# Patient Record
Sex: Male | Born: 1965 | Race: White | Hispanic: No | Smoking: Never smoker
Health system: Southern US, Community
[De-identification: ages and names within clinical notes are randomized; demographics above are authoritative.]

## PROBLEM LIST (undated history)

## (undated) DIAGNOSIS — M1990 Unspecified osteoarthritis, unspecified site: Secondary | ICD-10-CM

## (undated) DIAGNOSIS — F419 Anxiety disorder, unspecified: Secondary | ICD-10-CM

## (undated) DIAGNOSIS — F32A Depression, unspecified: Secondary | ICD-10-CM

## (undated) HISTORY — DX: Unspecified osteoarthritis, unspecified site: M19.90

## (undated) HISTORY — PX: LASIK: SHX215

## (undated) HISTORY — PX: VASECTOMY: SHX75

## (undated) HISTORY — PX: COLONOSCOPY W/ POLYPECTOMY: SHX1380

## (undated) HISTORY — DX: Anxiety disorder, unspecified: F41.9

## (undated) HISTORY — PX: FOREARM SURGERY: SHX651

## (undated) HISTORY — PX: HIP ARTHROSCOPY: SUR88

## (undated) HISTORY — PX: UMBILICAL HERNIA REPAIR: SHX196

## (undated) HISTORY — DX: Depression, unspecified: F32.A

---

## 1999-08-06 ENCOUNTER — Ambulatory Visit: Admission: RE | Admit: 1999-08-06 | Discharge: 1999-08-06 | Payer: Self-pay

## 2009-07-11 HISTORY — PX: TONSILECTOMY, ADENOIDECTOMY, BILATERAL MYRINGOTOMY AND TUBES: SHX2538

## 2020-02-13 ENCOUNTER — Encounter: Payer: Self-pay | Admitting: Cardiology

## 2020-02-13 ENCOUNTER — Other Ambulatory Visit: Payer: Self-pay

## 2020-02-13 ENCOUNTER — Ambulatory Visit (INDEPENDENT_AMBULATORY_CARE_PROVIDER_SITE_OTHER): Payer: Self-pay | Admitting: Cardiology

## 2020-02-13 DIAGNOSIS — E785 Hyperlipidemia, unspecified: Secondary | ICD-10-CM

## 2020-02-13 DIAGNOSIS — I10 Essential (primary) hypertension: Secondary | ICD-10-CM

## 2020-02-13 DIAGNOSIS — R072 Precordial pain: Secondary | ICD-10-CM

## 2020-02-13 HISTORY — DX: Precordial pain: R07.2

## 2020-02-13 HISTORY — DX: Essential (primary) hypertension: I10

## 2020-02-13 HISTORY — DX: Hyperlipidemia, unspecified: E78.5

## 2020-02-13 MED ORDER — RANOLAZINE ER 500 MG PO TB12: 500.0000 mg | ORAL_TABLET | Freq: Two times a day (BID) | ORAL | 2 refills | Status: DC

## 2020-02-13 MED ORDER — NITROGLYCERIN 0.4 MG SL SUBL: 0.4000 mg | SUBLINGUAL_TABLET | SUBLINGUAL | 11 refills | Status: DC | PRN

## 2020-02-13 NOTE — Patient Instructions (Addendum)
Medication Instructions:  Your physician has recommended you make the following change in your medication:   START: Ranexa 500 mg twice daily   TAKE AS NEEDED FOR CHEST PAIN ONLY AFTER 48 hours of NO CIALIS: Nitroglycerin 0.4 mg sublingual (under your tongue) as needed for chest pain. If experiencing chest pain, stop what you are doing and sit down. Take 1 nitroglycerin and wait 5 minutes. If chest pain continues, take another nitroglycerin and wait 5 minutes. If chest pain does not subside, take 1 more nitroglycerin and dial 911. You make take a total of 3 nitroglycerin in a 15 minute time frame.   *If you need a refill on your cardiac medications before your next appointment, please call your pharmacy*   Lab Work: None.  If you have labs (blood work) drawn today and your tests are completely normal, you will receive your results only by: Marland Kitchen MyChart Message (if you have MyChart) OR . A paper copy in the mail If you have any lab test that is abnormal or we need to change your treatment, we will call you to review the results.   Testing/Procedures: Your cardiac CT will be scheduled at one of the below locations:   [facility] [address] [place], [place] [address] [phone]  [place] [address] [place],  [address] [phone]  If scheduled at [facility], please arrive at the [facility] main entrance of [facility] 30 minutes prior to test start time. Proceed to the [facility] Radiology Department (first floor) to check-in and test prep.  If scheduled at [facility], please arrive 15 mins early for check-in and test prep.  Please follow these instructions carefully (unless otherwise directed):  Hold all erectile dysfunction medications at least 3 days (72 hrs) prior to test.  On the Night Before the Test: . Be sure to Drink plenty of water. . Do  not consume any caffeinated/decaffeinated beverages or chocolate 12 hours prior to your test. . Do not take any antihistamines 12 hours prior to your test.   On the Day of the Test: . Drink plenty of water. Do not drink any water within one hour of the test. . Do not eat any food 4 hours prior to the test. . You may take your regular medications prior to the test.  . Take metoprolol (Lopressor) (beta blocker/atenolol) two hours prior to test. . HOLD Furosemide/Hydrochlorothiazide morning of the test.         After the Test: . Drink plenty of water. . After receiving IV contrast, you may experience a mild flushed feeling. This is normal. . On occasion, you may experience a mild rash up to 24 hours after the test. This is not dangerous. If this occurs, you can take Benadryl 25 mg and increase your fluid intake. . If you experience trouble breathing, this can be serious. If it is severe call 911 IMMEDIATELY. If it is mild, please call our office. . If you take any of these medications: Glipizide/Metformin, Avandament, Glucavance, please do not take 48 hours after completing test unless otherwise instructed.   Once we have confirmed authorization from your insurance company, we will call you to set up a date and time for your test. Based on how quickly your insurance processes prior authorizations requests, please allow up to 4 weeks to be contacted for scheduling your Cardiac CT appointment. Be advised that routine Cardiac CT appointments  could be scheduled as many as 8 weeks after your provider has ordered it.  For non-scheduling related questions, please contact the cardiac imaging nurse navigator should you have any questions/concerns: Marchia Bond, Cardiac Imaging Nurse Navigator Burley Saver, Interim Cardiac Imaging Nurse [place] [facility] Direct Office Dial: [phone]   For scheduling needs, including cancellations and rescheduling, please call Vivien Rota at  [phone], option 3.      Follow-Up: At [facility], you and your health needs are our priority.  As part of our continuing mission to provide you with exceptional heart care, we have created designated Provider Care Teams.  These Care Teams include your primary Cardiologist (physician) and Advanced Practice Providers (APPs -  Physician Assistants and Nurse Practitioners) who all work together to provide you with the care you need, when you need it.  We recommend signing up for the patient portal called "MyChart".  Sign up information is provided on this After Visit Summary.  MyChart is used to connect with patients for Virtual Visits (Telemedicine).  Patients are able to view lab/test results, encounter notes, upcoming appointments, etc.  Non-urgent messages can be sent to your provider as well.   To learn more about what you can do with MyChart, go to [email or web address].    Your next appointment:   1 month(s)  The format for your next appointment:   In Person  Provider:   Jenne Campus, MD   Other Instructions  Nitroglycerin sublingual tablets What is this medicine? NITROGLYCERIN (nye troe GLI ser in) is a type of vasodilator. It relaxes blood vessels, increasing the blood and oxygen supply to your heart. This medicine is used to relieve chest pain caused by angina. It is also used to prevent chest pain before activities like climbing stairs, going outdoors in cold weather, or sexual activity. This medicine may be used for other purposes; ask your health care provider or pharmacist if you have questions. COMMON BRAND NAME(S): Nitroquick, Nitrostat, Nitrotab What should I tell my health care provider before I take this medicine? They need to know if you have any of these conditions:  anemia  head injury, recent stroke, or bleeding in the brain  liver disease  previous heart attack  an unusual or allergic reaction to nitroglycerin, other medicines, foods, dyes, or  preservatives  pregnant or trying to get pregnant  breast-feeding How should I use this medicine? Take this medicine by mouth as needed. At the first sign of an angina attack (chest pain or tightness) place one tablet under your tongue. You can also take this medicine 5 to 10 minutes before an event likely to produce chest pain. Follow the directions on the prescription label. Let the tablet dissolve under the tongue. Do not swallow whole. Replace the dose if you accidentally swallow it. It will help if your mouth is not dry. Saliva around the tablet will help it to dissolve more quickly. Do not eat or drink, smoke or chew tobacco while a tablet is dissolving. If you are not better within 5 minutes after taking ONE dose of nitroglycerin, call 9-1-1 immediately to seek emergency medical care. Do not take more than 3 nitroglycerin tablets over 15 minutes. If you take this medicine often to relieve symptoms of angina, your doctor or health care professional may provide you with different instructions to manage your symptoms. If symptoms do not go away after following these instructions, it is important to call 9-1-1 immediately. Do not take more than 3 nitroglycerin  tablets over 15 minutes. Talk to your pediatrician regarding the use of this medicine in children. Special care may be needed. Overdosage: If you think you have taken too much of this medicine contact a poison control center or emergency room at once. NOTE: This medicine is only for you. Do not share this medicine with others. What if I miss a dose? This does not apply. This medicine is only used as needed. What may interact with this medicine? Do not take this medicine with any of the following medications:  certain migraine medicines like ergotamine and dihydroergotamine (DHE)  medicines used to treat erectile dysfunction like sildenafil, tadalafil, and vardenafil  riociguat This medicine may also interact with the following  medications:  alteplase  aspirin  heparin  medicines for high blood pressure  medicines for mental depression  other medicines used to treat angina  phenothiazines like chlorpromazine, mesoridazine, prochlorperazine, thioridazine This list may not describe all possible interactions. Give your health care provider a list of all the medicines, herbs, non-prescription drugs, or dietary supplements you use. Also tell them if you smoke, drink alcohol, or use illegal drugs. Some items may interact with your medicine. What should I watch for while using this medicine? Tell your doctor or health care professional if you feel your medicine is no longer working. Keep this medicine with you at all times. Sit or lie down when you take your medicine to prevent falling if you feel dizzy or faint after using it. Try to remain calm. This will help you to feel better faster. If you feel dizzy, take several deep breaths and lie down with your feet propped up, or bend forward with your head resting between your knees. You may get drowsy or dizzy. Do not drive, use machinery, or do anything that needs mental alertness until you know how this drug affects you. Do not stand or sit up quickly, especially if you are an older patient. This reduces the risk of dizzy or fainting spells. Alcohol can make you more drowsy and dizzy. Avoid alcoholic drinks. Do not treat yourself for coughs, colds, or pain while you are taking this medicine without asking your doctor or health care professional for advice. Some ingredients may increase your blood pressure. What side effects may I notice from receiving this medicine? Side effects that you should report to your doctor or health care professional as soon as possible:  blurred vision  dry mouth  skin rash  sweating  the feeling of extreme pressure in the head  unusually weak or tired Side effects that usually do not require medical attention (report to your doctor or  health care professional if they continue or are bothersome):  flushing of the face or neck  headache  irregular heartbeat, palpitations  nausea, vomiting This list may not describe all possible side effects. Call your doctor for medical advice about side effects. You may report side effects to FDA at 1-800-FDA-1088. Where should I keep my medicine? Keep out of the reach of children. Store at room temperature between 20 and 25 degrees C (68 and 77 degrees F). Store in Chief of Staff. Protect from light and moisture. Keep tightly closed. Throw away any unused medicine after the expiration date. NOTE: This sheet is a summary. It may not cover all possible information. If you have questions about this medicine, talk to your doctor, pharmacist, or health care provider.  2020 Elsevier/Gold Standard (2013-04-25 17:57:36)

## 2020-02-13 NOTE — Progress Notes (Signed)
Cardiology Consultation:    Date:  02/13/2020   ID:  Dale Mills, DOB 06/16/1966, MRN [id number]  PCP:  Nonnie Done., MD  Cardiologist:  Gypsy Balsam, MD   Referring MD: Nonnie Done., MD   No chief complaint on file. After chest pain  History of Present Illness:    Dale Mills is a 54 y.o. male who is being seen today for the evaluation of pain at the request of [place], Excell Seltzer., MD.  His past medical history significant for essential hypertension, dyslipidemia, he never smoked but does have family history of premature coronary artery disease.  His father actually had first problem with his heart before age of 26.  He was referred to [place] because of chest pain.  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  When he does not he will develop the sensation it is associated with some sweating but he sweats anywhere, there is no shortness of breath he graded sensation of 4 in scale up to 10.  Has been gone for about a month a month and a half.  He also describes similar sensation when he will do weed eating.  At the same time he is able to walk climb stairs and no difficulties doing that.  There is no shortness of breath associated with this sensation. His risk factors include family history of premature coronary artery disease in his father side. He does have dyslipidemia   History reviewed. No pertinent past medical history.  Past Surgical History:  Procedure Laterality Date  . TONSILECTOMY, ADENOIDECTOMY, BILATERAL MYRINGOTOMY AND TUBES  2011    Current Medications: Current Meds  Medication Sig  . aspirin 325 MG tablet Take 325 mg by mouth daily.  Marland Kitchen atenolol (TENORMIN) 25 MG tablet TAKE 1 2 (ONE HALF) TABLET BY MOUTH ONCE DAILY  . atorvastatin (LIPITOR) 10 MG tablet Take 10 mg by mouth daily.  Marland Kitchen lisinopril (ZESTRIL) 20 MG tablet Take 20 mg by mouth daily.  . tadalafil (CIALIS)  20 MG tablet Take 1 tablet by mouth daily.     Allergies:   Patient has no known allergies.   Social History   Socioeconomic History  . Marital status: Divorced    Spouse name: Not on file  . Number of children: Not on file  . Years of education: Not on file  . Highest education level: Not on file  Occupational History  . Not on file  Tobacco Use  . Smoking status: Never Smoker  . Smokeless tobacco: Never Used  Substance and Sexual Activity  . Alcohol use: Never  . Drug use: Never  . Sexual activity: Not on file  Other Topics Concern  . Not on file  Social History Narrative  . Not on file   Social Determinants of Health   Financial Resource Strain:   . Difficulty of Paying Living Expenses:   Food Insecurity:   . Worried About Programme researcher, broadcasting/film/video in the Last Year:   . Barista in the Last Year:   Transportation Needs:   . Freight forwarder (Medical):   Marland Kitchen Lack of Transportation (Non-Medical):   Physical Activity:   . Days of Exercise per Week:   . Minutes of Exercise per Session:   Stress:   . Feeling of Stress :   Social Connections:   . Frequency of Communication with Friends and Family:   .  Frequency of Social Gatherings with Friends and Family:   . Attends Religious Services:   . Active Member of Clubs or Organizations:   . Attends Banker Meetings:   Marland Kitchen Marital Status:      Family History: The patient's family history includes Heart attack in his father and paternal grandfather. ROS:   Please see the history of present illness.    All 14 point review of systems negative except as described per history of present illness.  EKGs/Labs/Other Studies Reviewed:    The following studies were reviewed today:   EKG:  EKG is  ordered today.  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  Recent Labs: No results found for  requested labs within last 8760 hours.  Recent Lipid Panel No results found for: CHOL, TRIG, HDL, CHOLHDL, VLDL, LDLCALC, LDLDIRECT  Physical Exam:    VS:  BP 126/72 (BP Location: Left Arm, Patient Position: Sitting, Cuff Size: Normal)   Pulse (!) 53   Ht 6' (1.829 m)   Wt 280 lb (127 kg)   SpO2 96%   BMI 37.97 kg/m     Wt Readings from Last 3 Encounters:  02/13/20 280 lb (127 kg)     GEN:  Well nourished, well developed in no acute distress HEENT: Normal NECK: No JVD; No carotid bruits LYMPHATICS: No lymphadenopathy CARDIAC: RRR, no murmurs, no rubs, no gallops RESPIRATORY:  Clear to auscultation without rales, wheezing or rhonchi  ABDOMEN: Soft, non-tender, non-distended MUSCULOSKELETAL:  No edema; No deformity  SKIN: Warm and dry NEUROLOGIC:  Alert and oriented x 3 PSYCHIATRIC:  Normal affect   ASSESSMENT:    1. Precordial chest pain   2. Essential hypertension   3. Dyslipidemia    PLAN:    In order of problems listed above:  1. Precordial chest pain suspicious for coronary artery disease/angina pectoris.  He is already on aspirin which advised him to continue, he is already on statin which I will continue.  I will add ranolazine 500 twice daily to his medical regiment as well as nitroglycerin as needed.  We will talk about options for the situation I will decided to proceed with coronary CT angio.  He does have some atypical features of this sensation mainly examining his epigastrium when palpating it can reproduce the pain.  However clearly he does have some very worrisome characteristics.  He was told to go to the emergency room if nitroglycerin does not relieve the pain. 2. Essential hypertension blood pressure seems to be controlled today his blood pressure is 126/72 we will continue present management. 3. Dyslipidemia he is on moderate intensity statin that were just recently started he is on Lipitor 10 which I will continue for now.   He was told to avoid  excessive exercise he was also told to go to the emergency room if pain is not relieved by.  Nitroglycerin  Medication Adjustments/Labs and Tests Ordered: Current medicines are reviewed at length with the patient today.  Concerns regarding medicines are outlined above.  No orders of the defined types were placed in this encounter.  No orders of the defined types were placed in this encounter.   Signed, Georgeanna Lea, MD, [name]. 02/13/2020 3:23 PM    [facility]

## 2020-04-10 ENCOUNTER — Other Ambulatory Visit: Payer: Self-pay

## 2020-04-10 ENCOUNTER — Encounter: Payer: Self-pay | Admitting: Cardiology

## 2020-04-10 ENCOUNTER — Ambulatory Visit (INDEPENDENT_AMBULATORY_CARE_PROVIDER_SITE_OTHER): Payer: Self-pay | Admitting: Cardiology

## 2020-04-10 VITALS — BP 134/68 | HR 60 | Ht 72.0 in | Wt 280.6 lb

## 2020-04-10 DIAGNOSIS — E785 Hyperlipidemia, unspecified: Secondary | ICD-10-CM

## 2020-04-10 DIAGNOSIS — I209 Angina pectoris, unspecified: Secondary | ICD-10-CM | POA: Insufficient documentation

## 2020-04-10 DIAGNOSIS — I10 Essential (primary) hypertension: Secondary | ICD-10-CM

## 2020-04-10 HISTORY — DX: Angina pectoris, unspecified: I20.9

## 2020-04-10 MED ORDER — RANOLAZINE ER 1000 MG PO TB12: 1000.0000 mg | ORAL_TABLET | Freq: Two times a day (BID) | ORAL | 1 refills | Status: DC

## 2020-04-10 NOTE — Patient Instructions (Signed)
Medication Instructions:  Your physician has recommended you make the following change in your medication:   INCREASE: Ranexa 1000 mg twice daily   *If you need a refill on your cardiac medications before your next appointment, please call your pharmacy*   Lab Work: Your physician recommends that you return for lab work today: lipid   If you have labs (blood work) drawn today and your tests are completely normal, you will receive your results only by: Marland Kitchen MyChart Message (if you have MyChart) OR . A paper copy in the mail If you have any lab test that is abnormal or we need to change your treatment, we will call you to review the results.   Testing/Procedures: None.    Follow-Up: At [facility], you and your health needs are our priority.  As part of our continuing mission to provide you with exceptional heart care, we have created designated Provider Care Teams.  These Care Teams include your primary Cardiologist (physician) and Advanced Practice Providers (APPs -  Physician Assistants and Nurse Practitioners) who all work together to provide you with the care you need, when you need it.  We recommend signing up for the patient portal called "MyChart".  Sign up information is provided on this After Visit Summary.  MyChart is used to connect with patients for Virtual Visits (Telemedicine).  Patients are able to view lab/test results, encounter notes, upcoming appointments, etc.  Non-urgent messages can be sent to your provider as well.   To learn more about what you can do with MyChart, go to [email or web address].    Your next appointment:   2 month(s)  The format for your next appointment:   In Person  Provider:   Gypsy Balsam, MD   Other Instructions

## 2020-04-10 NOTE — Progress Notes (Signed)
Cardiology Office Note:    Date:  04/10/2020   ID:  Dale Mills, DOB 11/03/1965, MRN [id number]  PCP:  Nonnie Done., MD  Cardiologist:  Gypsy Balsam, MD    Referring MD: Nonnie Done., MD   No chief complaint on file. I am doing better  History of Present Illness:    Dale Mills is a 54 y.o. male he came to me with chief complaint of exertional chest pain.  That pain also happened with excitement.  He did have some atypical features including some tenderness on epigastric pressing.  We elected to pursue coronary CT angio however there was some difficulty approving from insurance company make a long story short of the procedure still not done.  I put him on aspirin and put him also on ranolazine and he feels significantly better he said the pain is much improved.  I also initiated him on Lipitor.  He said he has more energy and he can do more right now.  Denies having resting pain no prolonged episode of chest pain.  No dizziness no passing out  History reviewed. No pertinent past medical history.  Past Surgical History:  Procedure Laterality Date  . TONSILECTOMY, ADENOIDECTOMY, BILATERAL MYRINGOTOMY AND TUBES  2011    Current Medications: Current Meds  Medication Sig  . aspirin 325 MG tablet Take 325 mg by mouth daily.  Marland Kitchen atenolol (TENORMIN) 25 MG tablet TAKE 1 2 (ONE HALF) TABLET BY MOUTH ONCE DAILY  . atorvastatin (LIPITOR) 10 MG tablet Take 10 mg by mouth daily.  Marland Kitchen lisinopril (ZESTRIL) 20 MG tablet Take 20 mg by mouth daily.  . nitroGLYCERIN (NITROSTAT) 0.4 MG SL tablet Place 1 tablet (0.4 mg total) under the tongue every 5 (five) minutes as needed for chest pain.  . ranolazine (RANEXA) 500 MG 12 hr tablet Take 1 tablet (500 mg total) by mouth 2 (two) times daily.  . tadalafil (CIALIS) 20 MG tablet Take 1 tablet by mouth daily.     Allergies:   Patient has no known allergies.   Social History   Socioeconomic History  . Marital status: Divorced    Spouse  name: Not on file  . Number of children: Not on file  . Years of education: Not on file  . Highest education level: Not on file  Occupational History  . Not on file  Tobacco Use  . Smoking status: Never Smoker  . Smokeless tobacco: Never Used  Substance and Sexual Activity  . Alcohol use: Never  . Drug use: Never  . Sexual activity: Not on file  Other Topics Concern  . Not on file  Social History Narrative  . Not on file   Social Determinants of Health   Financial Resource Strain:   . Difficulty of Paying Living Expenses: Not on file  Food Insecurity:   . Worried About Programme researcher, broadcasting/film/video in the Last Year: Not on file  . Ran Out of Food in the Last Year: Not on file  Transportation Needs:   . Lack of Transportation (Medical): Not on file  . Lack of Transportation (Non-Medical): Not on file  Physical Activity:   . Days of Exercise per Week: Not on file  . Minutes of Exercise per Session: Not on file  Stress:   . Feeling of Stress : Not on file  Social Connections:   . Frequency of Communication with Friends and Family: Not on file  . Frequency of Social Gatherings with Friends and Family:  Not on file  . Attends Religious Services: Not on file  . Active Member of Clubs or Organizations: Not on file  . Attends Banker Meetings: Not on file  . Marital Status: Not on file     Family History: The patient's family history includes Heart attack in his father and paternal grandfather. ROS:   Please see the history of present illness.    All 14 point review of systems negative except as described per history of present illness  EKGs/Labs/Other Studies Reviewed:      Recent Labs: No results found for requested labs within last 8760 hours.  Recent Lipid Panel No results found for: CHOL, TRIG, HDL, CHOLHDL, VLDL, LDLCALC, LDLDIRECT  Physical Exam:    VS:  BP 134/68 (BP Location: Left Arm, Patient Position: Sitting, Cuff Size: Normal)   Pulse 60   Ht 6'  (1.829 m)   Wt 280 lb 9.6 oz (127.3 kg)   SpO2 97%   BMI 38.06 kg/m     Wt Readings from Last 3 Encounters:  04/10/20 280 lb 9.6 oz (127.3 kg)  02/13/20 280 lb (127 kg)     GEN:  Well nourished, well developed in no acute distress HEENT: Normal NECK: No JVD; No carotid bruits LYMPHATICS: No lymphadenopathy CARDIAC: RRR, no murmurs, no rubs, no gallops RESPIRATORY:  Clear to auscultation without rales, wheezing or rhonchi  ABDOMEN: Soft, non-tender, non-distended MUSCULOSKELETAL:  No edema; No deformity  SKIN: Warm and dry LOWER EXTREMITIES: no swelling NEUROLOGIC:  Alert and oriented x 3 PSYCHIATRIC:  Normal affect   ASSESSMENT:    1. Essential hypertension   2. Angina pectoris (HCC) Canadian classification 2   3. Dyslipidemia    PLAN:    In order of problems listed above:  1. Angina pectoris: So far medical therapy I will double the dose of ranolazine from 500 mg twice daily to 1000 mg twice a day, will continue using nitroglycerin on as-needed basis.  I asked him to reduce dose of aspirin from 325 to 81 mg twice daily.  He is already taking beta-blocker which I will continue.  I told him also he cannot take Viagra/Cialis when he takes nitroglycerin.  Will investigate what is the problem with coronary CT angio if they will not pay for it we will look for alternative way to manage this problem so far he seems to be responding to medical therapy however we still do not know exactly how extensive the problem is. 2. Dyslipidemia he is on 10 mg Lipitor I will check his fasting lipid profile today.  Anticipate need to increase dose of Lipitor. 3. Essential hypertension: Blood pressure well controlled today.   Medication Adjustments/Labs and Tests Ordered: Current medicines are reviewed at length with the patient today.  Concerns regarding medicines are outlined above.  No orders of the defined types were placed in this encounter.  Medication changes: No orders of the defined  types were placed in this encounter.   Signed, Georgeanna Lea, MD, [facility] 04/10/2020 3:04 PM    [facility]

## 2020-04-17 LAB — LIPID PANEL
Chol/HDL Ratio: 4.7 ratio (ref 0.0–5.0)
Cholesterol, Total: 155 mg/dL (ref 100–199)
HDL: 33 mg/dL — ABNORMAL LOW (ref >39)
LDL Chol Calc (NIH): 104 mg/dL — ABNORMAL HIGH (ref 0–99)
Triglycerides: 96 mg/dL (ref 0–149)
VLDL Cholesterol Cal: 18 mg/dL (ref 5–40)

## 2020-04-27 ENCOUNTER — Telehealth: Payer: Self-pay | Admitting: Cardiology

## 2020-04-27 ENCOUNTER — Other Ambulatory Visit: Payer: Self-pay

## 2020-04-27 MED ORDER — ATORVASTATIN CALCIUM 20 MG PO TABS
20.0000 mg | ORAL_TABLET | Freq: Every day | ORAL | 3 refills | Status: DC
Start: 2020-04-27 — End: 2021-06-25

## 2020-04-27 NOTE — Telephone Encounter (Signed)
Pt advised his lab results and agreed to taking Lipitor 20 mg daily... New RX sent to this pharmacy.   Pt asking what to do with his beta blocker prior to the Cardiac CT this Friday 05/01/20.   He takes Atenolol 12.5 mg daily and his HR at his Last OV was 60.. will forward to [facility] for review.

## 2020-04-27 NOTE — Telephone Encounter (Signed)
Pt called back in , returning call to [facility] for lab results   Best number-  [phone]

## 2020-04-28 NOTE — Telephone Encounter (Signed)
Called patient informed him to take atenolol 2 hour before ct. Patient verbally understands.

## 2020-04-28 NOTE — Telephone Encounter (Signed)
He either take metoprolol as per protocol or atenolol before his CT

## 2020-04-29 ENCOUNTER — Telehealth (HOSPITAL_COMMUNITY): Payer: Self-pay | Admitting: Emergency Medicine

## 2020-04-29 NOTE — Telephone Encounter (Signed)
Reaching out to patient to offer assistance regarding upcoming cardiac imaging study; pt verbalizes understanding of appt date/time, parking situation and where to check in, pre-test NPO status and medications ordered, and verified current allergies; name and call back number provided for further questions should they arise Rockwell Alexandria RN Navigator Cardiac Imaging [facility] [phone] office [phone] cell    pt informed NOT to take CIALIS now until after test is completed. Pt verbalized understanding. Pt also verbalized understanding to take daily atenolol 2 hr prior to scan

## 2020-05-01 ENCOUNTER — Other Ambulatory Visit: Payer: Self-pay

## 2020-05-01 ENCOUNTER — Ambulatory Visit (HOSPITAL_COMMUNITY)
Admission: RE | Admit: 2020-05-01 | Discharge: 2020-05-01 | Disposition: A | Discharge status: Home / Self Care | Payer: Self-pay | Source: Ambulatory Visit | Attending: Cardiology | Admitting: Cardiology

## 2020-05-01 DIAGNOSIS — R072 Precordial pain: Secondary | ICD-10-CM

## 2020-05-01 DIAGNOSIS — I10 Essential (primary) hypertension: Secondary | ICD-10-CM | POA: Insufficient documentation

## 2020-05-01 DIAGNOSIS — E785 Hyperlipidemia, unspecified: Secondary | ICD-10-CM | POA: Insufficient documentation

## 2020-05-01 MED ORDER — NITROGLYCERIN 0.4 MG SL SUBL
0.8000 mg | SUBLINGUAL_TABLET | Freq: Once | SUBLINGUAL | Status: AC
  Administered 2020-05-01: 0.8 mg via SUBLINGUAL

## 2020-05-01 MED ORDER — IOHEXOL 350 MG/ML SOLN
90.0000 mL | Freq: Once | INTRAVENOUS | Status: AC | PRN
  Administered 2020-05-01: 90 mL via INTRAVENOUS

## 2020-05-01 MED ORDER — NITROGLYCERIN 0.4 MG SL SUBL
SUBLINGUAL_TABLET | SUBLINGUAL | Status: AC
  Filled 2020-05-01: qty 2

## 2020-05-13 ENCOUNTER — Telehealth: Payer: Self-pay | Admitting: Cardiology

## 2020-05-13 NOTE — Telephone Encounter (Signed)
Dale Lea, MD  05/05/2020 3:17 PM EDT     Very minimal coronary disease involving distal portion of left anterior descending artery, nonobstructive. Calcium score only 15. Overall is a good result   Reviewed results with patient who verbalized understanding.   Reiterated the importance of healthy lifestyle modifications to prevent progression. He was grateful for call and agrees with plan.

## 2020-05-13 NOTE — Telephone Encounter (Signed)
Patient is returning call to discuss CT results. 

## 2020-06-12 ENCOUNTER — Ambulatory Visit (INDEPENDENT_AMBULATORY_CARE_PROVIDER_SITE_OTHER): Payer: Self-pay | Admitting: Cardiology

## 2020-06-12 ENCOUNTER — Encounter: Payer: Self-pay | Admitting: Cardiology

## 2020-06-12 ENCOUNTER — Other Ambulatory Visit: Payer: Self-pay

## 2020-06-12 VITALS — BP 140/82 | HR 75 | Ht 72.0 in | Wt 274.0 lb

## 2020-06-12 DIAGNOSIS — E785 Hyperlipidemia, unspecified: Secondary | ICD-10-CM

## 2020-06-12 DIAGNOSIS — I209 Angina pectoris, unspecified: Secondary | ICD-10-CM

## 2020-06-12 DIAGNOSIS — I25118 Atherosclerotic heart disease of native coronary artery with other forms of angina pectoris: Secondary | ICD-10-CM

## 2020-06-12 DIAGNOSIS — I2510 Atherosclerotic heart disease of native coronary artery without angina pectoris: Secondary | ICD-10-CM | POA: Insufficient documentation

## 2020-06-12 DIAGNOSIS — I10 Essential (primary) hypertension: Secondary | ICD-10-CM

## 2020-06-12 HISTORY — DX: Atherosclerotic heart disease of native coronary artery without angina pectoris: I25.10

## 2020-06-12 NOTE — Progress Notes (Signed)
Cardiology Office Note:    Date:  06/12/2020   ID:  Dale Mills, DOB [birth year], MRN [id number]  PCP:  Nonnie Done., MD  Cardiologist:  Gypsy Balsam, MD    Referring MD: Nonnie Done., MD   Chief Complaint  Patient presents with  . follow up CT scan  I am doing much better  History of Present Illness:    Dale Mills is a 54 y.o. male who came to [place] with chest pain some suspicion for angina pectoris.  He was put on appropriate antianginal medication with very good response in the meantime he did have coronary CT angio which did not show any major obstruction in his coronary arteries.  He comes today to my office to discuss this.  Overall he is doing well on antianginal therapy denies have any chest pain no tightness no squeezing no pressure no burning in the chest.  Past Medical History:  Diagnosis Date  . Angina pectoris (HCC) Canadian classification 2 04/10/2020  . Dyslipidemia 02/13/2020  . Essential hypertension 02/13/2020  . Precordial chest pain 02/13/2020    Past Surgical History:  Procedure Laterality Date  . TONSILECTOMY, ADENOIDECTOMY, BILATERAL MYRINGOTOMY AND TUBES  2011    Current Medications: Current Meds  Medication Sig  . aspirin 325 MG tablet Take 325 mg by mouth daily.  Marland Kitchen atenolol (TENORMIN) 25 MG tablet TAKE 1 2 (ONE HALF) TABLET BY MOUTH ONCE DAILY  . atorvastatin (LIPITOR) 20 MG tablet Take 1 tablet (20 mg total) by mouth daily.  Marland Kitchen lisinopril (ZESTRIL) 20 MG tablet Take 20 mg by mouth daily.  . nitroGLYCERIN (NITROSTAT) 0.4 MG SL tablet Place 1 tablet (0.4 mg total) under the tongue every 5 (five) minutes as needed for chest pain.  . ranolazine (RANEXA) 1000 MG SR tablet Take 1 tablet (1,000 mg total) by mouth 2 (two) times daily. (Patient taking differently: Take 1,000 mg by mouth daily. )  . tadalafil (CIALIS) 20 MG tablet Take 1 tablet by mouth every 3 (three) days.      Allergies:   Patient has no known allergies.   Social History    Socioeconomic History  . Marital status: Divorced    Spouse name: Not on file  . Number of children: Not on file  . Years of education: Not on file  . Highest education level: Not on file  Occupational History  . Not on file  Tobacco Use  . Smoking status: Never Smoker  . Smokeless tobacco: Never Used  Substance and Sexual Activity  . Alcohol use: Never  . Drug use: Never  . Sexual activity: Not on file  Other Topics Concern  . Not on file  Social History Narrative  . Not on file   Social Determinants of Health   Financial Resource Strain:   . Difficulty of Paying Living Expenses: Not on file  Food Insecurity:   . Worried About Programme researcher, broadcasting/film/video in the Last Year: Not on file  . Ran Out of Food in the Last Year: Not on file  Transportation Needs:   . Lack of Transportation (Medical): Not on file  . Lack of Transportation (Non-Medical): Not on file  Physical Activity:   . Days of Exercise per Week: Not on file  . Minutes of Exercise per Session: Not on file  Stress:   . Feeling of Stress : Not on file  Social Connections:   . Frequency of Communication with Friends and Family: Not on file  .  Frequency of Social Gatherings with Friends and Family: Not on file  . Attends Religious Services: Not on file  . Active Member of Clubs or Organizations: Not on file  . Attends Banker Meetings: Not on file  . Marital Status: Not on file     Family History: The patient's family history includes Heart attack in his father and paternal grandfather. ROS:   Please see the history of present illness.    All 14 point review of systems negative except as described per history of present illness  EKGs/Labs/Other Studies Reviewed:      Recent Labs: No results found for requested labs within last 8760 hours.  Recent Lipid Panel    Component Value Date/Time   CHOL 155 04/17/2020 0839   TRIG 96 04/17/2020 0839   HDL 33 (L) 04/17/2020 0839   CHOLHDL 4.7  04/17/2020 0839   LDLCALC 104 (H) 04/17/2020 0839    Physical Exam:    VS:  BP 140/82 (BP Location: Left Arm, Patient Position: Sitting)   Pulse 75   Ht 6' (1.829 m)   Wt 274 lb (124.3 kg)   SpO2 96%   BMI 37.16 kg/m     Wt Readings from Last 3 Encounters:  06/12/20 274 lb (124.3 kg)  04/10/20 280 lb 9.6 oz (127.3 kg)  02/13/20 280 lb (127 kg)     GEN:  Well nourished, well developed in no acute distress HEENT: Normal NECK: No JVD; No carotid bruits LYMPHATICS: No lymphadenopathy CARDIAC: RRR, no murmurs, no rubs, no gallops RESPIRATORY:  Clear to auscultation without rales, wheezing or rhonchi  ABDOMEN: Soft, non-tender, non-distended MUSCULOSKELETAL:  No edema; No deformity  SKIN: Warm and dry LOWER EXTREMITIES: no swelling NEUROLOGIC:  Alert and oriented x 3 PSYCHIATRIC:  Normal affect   ASSESSMENT:    1. Angina pectoris (HCC) Canadian classification 2   2. Coronary artery disease of native artery of native heart with stable angina pectoris (HCC)   3. Essential hypertension   4. Dyslipidemia    PLAN:    In order of problems listed above:  1. Angina pectoris now successfully managed with medications.  Coronary CT angio did not show major obstructions I suspect he may have some small branch disease or distal disease.  He is responding nicely to antianginal therapy, he will be risk factors modifications.  His blood pressure is well controlled which I will continue, I will continue antianginal medications including ranolazine. 2. Dyslipidemia: I did review his K PN which show me his LDL of 104 this is from April 17, 2020 with HDL of 33.  This is on 20 mg of Lipitor.  I will ask him to have fasting profile done today and I anticipate need to increase the dose of Lipitor. 3. Essential hypertension: Blood pressure well controlled continue present management. 4. We did talk about healthy lifestyle need to exercise on the regular basis as well as good diet.   Medication  Adjustments/Labs and Tests Ordered: Current medicines are reviewed at length with the patient today.  Concerns regarding medicines are outlined above.  No orders of the defined types were placed in this encounter.  Medication changes: No orders of the defined types were placed in this encounter.   Signed, Georgeanna Lea, MD, [facility] 06/12/2020 9:32 AM    [facility]

## 2020-06-12 NOTE — Addendum Note (Signed)
Addended by: Delorse Limber I on: 06/12/2020 09:36 AM   Modules accepted: Orders

## 2020-06-12 NOTE — Patient Instructions (Signed)

## 2020-06-13 LAB — LIPID PANEL
Chol/HDL Ratio: 5.9 ratio — ABNORMAL HIGH (ref 0.0–5.0)
Cholesterol, Total: 184 mg/dL (ref 100–199)
HDL: 31 mg/dL — ABNORMAL LOW (ref >39)
LDL Chol Calc (NIH): 123 mg/dL — ABNORMAL HIGH (ref 0–99)
Triglycerides: 169 mg/dL — ABNORMAL HIGH (ref 0–149)
VLDL Cholesterol Cal: 30 mg/dL (ref 5–40)

## 2020-06-15 ENCOUNTER — Telehealth: Payer: Self-pay | Admitting: Emergency Medicine

## 2020-06-15 DIAGNOSIS — E785 Hyperlipidemia, unspecified: Secondary | ICD-10-CM

## 2020-06-15 NOTE — Telephone Encounter (Signed)
Called patient. Informed him of results.Advised him to increase lipitor to 40 mg daily however he said he only takes 10 mg. Asked him to increase to 20 mg then and recheck labs in 6 weeks he verbally understood no further questions. Will informed Dr. Bing Matter.

## 2020-06-15 NOTE — Telephone Encounter (Signed)
-----   Message from Georgeanna Lea, MD sent at 06/15/2020 11:41 AM EST ----- Cholesterol still not already supposed to be, please double the dose of Lipitor, fasting lipid profile AST ALT in 6 weeks

## 2020-08-07 ENCOUNTER — Other Ambulatory Visit: Payer: Self-pay | Admitting: Cardiology

## 2020-10-09 ENCOUNTER — Other Ambulatory Visit: Payer: Self-pay | Admitting: Cardiology

## 2020-11-13 ENCOUNTER — Ambulatory Visit (INDEPENDENT_AMBULATORY_CARE_PROVIDER_SITE_OTHER): Payer: 59 | Admitting: Cardiology

## 2020-11-13 ENCOUNTER — Other Ambulatory Visit: Payer: Self-pay

## 2020-11-13 ENCOUNTER — Encounter: Payer: Self-pay | Admitting: Cardiology

## 2020-11-13 VITALS — BP 126/76 | HR 99 | Ht 72.0 in | Wt 269.0 lb

## 2020-11-13 DIAGNOSIS — I25118 Atherosclerotic heart disease of native coronary artery with other forms of angina pectoris: Secondary | ICD-10-CM | POA: Diagnosis not present

## 2020-11-13 DIAGNOSIS — I10 Essential (primary) hypertension: Secondary | ICD-10-CM

## 2020-11-13 DIAGNOSIS — E785 Hyperlipidemia, unspecified: Secondary | ICD-10-CM | POA: Diagnosis not present

## 2020-11-13 DIAGNOSIS — I209 Angina pectoris, unspecified: Secondary | ICD-10-CM | POA: Diagnosis not present

## 2020-11-13 NOTE — Addendum Note (Signed)
Addended by: Hazle Quant on: 11/13/2020 12:50 PM   Modules accepted: Orders

## 2020-11-13 NOTE — Progress Notes (Signed)
Cardiology Office Note:    Date:  11/13/2020   ID:  [name], DOB Apr 04, 1966, MRN [id number]  PCP:  Nonnie Done., MD  Cardiologist:  Gypsy Balsam, MD    Referring MD: Nonnie Done., MD   Chief Complaint  Patient presents with  . Follow-up  I am doing well  History of Present Illness:    Dale Mills is a 55 y.o. male who presented to me with fairly typical angina pectoris we initiated medical therapy with very good success, after that coronary CT angio being done which showed no significant obstruction in the major arteries.  Since that time he is being managed medically with great success he tells me today he is completely asymptomatic.  Denies have any chest pain tightness squeezing pressure burning chest no dizziness no passing out.  He is working hard physically have no difficulty doing it.  Past Medical History:  Diagnosis Date  . Angina pectoris (HCC) Canadian classification 2 04/10/2020  . Coronary artery disease coronary CT angio did not show any major issues, suspicion for distal disease 06/12/2020  . Dyslipidemia 02/13/2020  . Essential hypertension 02/13/2020  . Precordial chest pain 02/13/2020    Past Surgical History:  Procedure Laterality Date  . TONSILECTOMY, ADENOIDECTOMY, BILATERAL MYRINGOTOMY AND TUBES  2011    Current Medications: Current Meds  Medication Sig  . aspirin EC 81 MG tablet Take 81 mg by mouth daily. Swallow whole.  Marland Kitchen atenolol (TENORMIN) 25 MG tablet TAKE 1 2 (ONE HALF) TABLET BY MOUTH ONCE DAILY  . lisinopril (ZESTRIL) 20 MG tablet Take 20 mg by mouth daily.  . nitroGLYCERIN (NITROSTAT) 0.4 MG SL tablet Place 1 tablet (0.4 mg total) under the tongue every 5 (five) minutes as needed for chest pain.  . ranolazine (RANEXA) 1000 MG SR tablet Take 1 tablet by mouth twice daily (Patient taking differently: Take 1,000 mg by mouth daily.)  . tadalafil (CIALIS) 20 MG tablet Take 1 tablet by mouth every 3 (three) days.   . [DISCONTINUED]  aspirin 325 MG tablet Take 325 mg by mouth daily.     Allergies:   Patient has no known allergies.   Social History   Socioeconomic History  . Marital status: Divorced    Spouse name: Not on file  . Number of children: Not on file  . Years of education: Not on file  . Highest education level: Not on file  Occupational History  . Not on file  Tobacco Use  . Smoking status: Never Smoker  . Smokeless tobacco: Never Used  Substance and Sexual Activity  . Alcohol use: Never  . Drug use: Never  . Sexual activity: Not on file  Other Topics Concern  . Not on file  Social History Narrative  . Not on file   Social Determinants of Health   Financial Resource Strain: Not on file  Food Insecurity: Not on file  Transportation Needs: Not on file  Physical Activity: Not on file  Stress: Not on file  Social Connections: Not on file     Family History: The patient's family history includes Heart attack in his father and paternal grandfather. ROS:   Please see the history of present illness.    All 14 point review of systems negative except as described per history of present illness  EKGs/Labs/Other Studies Reviewed:      Recent Labs: No results found for requested labs within last 8760 hours.  Recent Lipid Panel    Component Value  Date/Time   CHOL 184 06/12/2020 0939   TRIG 169 (H) 06/12/2020 0939   HDL 31 (L) 06/12/2020 0939   CHOLHDL 5.9 (H) 06/12/2020 0939   LDLCALC 123 (H) 06/12/2020 0939    Physical Exam:    VS:  BP 126/76 (BP Location: Right Arm, Patient Position: Sitting)   Pulse 99   Ht 6' (1.829 m)   Wt 269 lb (122 kg)   SpO2 96%   BMI 36.48 kg/m     Wt Readings from Last 3 Encounters:  11/13/20 269 lb (122 kg)  06/12/20 274 lb (124.3 kg)  04/10/20 280 lb 9.6 oz (127.3 kg)     GEN:  Well nourished, well developed in no acute distress HEENT: Normal NECK: No JVD; No carotid bruits LYMPHATICS: No lymphadenopathy CARDIAC: RRR, no murmurs, no rubs, no  gallops RESPIRATORY:  Clear to auscultation without rales, wheezing or rhonchi  ABDOMEN: Soft, non-tender, non-distended MUSCULOSKELETAL:  No edema; No deformity  SKIN: Warm and dry LOWER EXTREMITIES: no swelling NEUROLOGIC:  Alert and oriented x 3 PSYCHIATRIC:  Normal affect   ASSESSMENT:    1. Coronary artery disease of native artery of native heart with stable angina pectoris (HCC)   2. Angina pectoris (HCC) Canadian classification 2   3. Dyslipidemia   4. Essential hypertension    PLAN:    In order of problems listed above:  1. Coronary artery disease coronary CT angio negative but did have typical angina pectoris nice practically asymptomatic we will continue beta-blocker as well as ranolazine. 2. Dyslipidemia I did review his K PN which show me his LDL of 123 and HDL 31 this is from 12 June 2021.  Clearly not well controlled.  I will check his fasting lipid profile today and then most likely require higher dose of Lipitor will probably go from 20-40. 3. Essential hypertension, blood pressure well controlled continue present management.   Medication Adjustments/Labs and Tests Ordered: Current medicines are reviewed at length with the patient today.  Concerns regarding medicines are outlined above.  No orders of the defined types were placed in this encounter.  Medication changes: No orders of the defined types were placed in this encounter.   Signed, Georgeanna Lea, MD, [facility] 11/13/2020 12:47 PM    [facility]

## 2020-11-13 NOTE — Patient Instructions (Signed)
Medication Instructions:  Your physician recommends that you continue on your current medications as directed. Please refer to the Current Medication list given to you today.  *If you need a refill on your cardiac medications before your next appointment, please call your pharmacy*   Lab Work: Your physician recommends that you return for lab work today: lipid, lft  If you have labs (blood work) drawn today and your tests are completely normal, you will receive your results only by: MyChart Message (if you have MyChart) OR A paper copy in the mail If you have any lab test that is abnormal or we need to change your treatment, we will call you to review the results.   Testing/Procedures: None   Follow-Up: At [facility], you and your health needs are our priority.  As part of our continuing mission to provide you with exceptional heart care, we have created designated Provider Care Teams.  These Care Teams include your primary Cardiologist (physician) and Advanced Practice Providers (APPs -  Physician Assistants and Nurse Practitioners) who all work together to provide you with the care you need, when you need it.  We recommend signing up for the patient portal called "MyChart".  Sign up information is provided on this After Visit Summary.  MyChart is used to connect with patients for Virtual Visits (Telemedicine).  Patients are able to view lab/test results, encounter notes, upcoming appointments, etc.  Non-urgent messages can be sent to your provider as well.   To learn more about what you can do with MyChart, go to [email or web address].com.    Your next appointment:   1 year(s)  The format for your next appointment:   In Person  Provider:   Robert Krasowski, MD    Other Instructions    

## 2020-11-14 LAB — HEPATIC FUNCTION PANEL
ALT: 21 IU/L (ref 0–44)
AST: 26 IU/L (ref 0–40)
Albumin: 4 g/dL (ref 3.8–4.9)
Alkaline Phosphatase: 59 IU/L (ref 44–121)
Bilirubin Total: 0.4 mg/dL (ref 0.0–1.2)
Bilirubin, Direct: 0.12 mg/dL (ref 0.00–0.40)
Total Protein: 6.4 g/dL (ref 6.0–8.5)

## 2020-11-14 LAB — LIPID PANEL
Chol/HDL Ratio: 5.3 ratio — ABNORMAL HIGH (ref 0.0–5.0)
Cholesterol, Total: 153 mg/dL (ref 100–199)
HDL: 29 mg/dL — ABNORMAL LOW (ref >39)
LDL Chol Calc (NIH): 96 mg/dL (ref 0–99)
Triglycerides: 161 mg/dL — ABNORMAL HIGH (ref 0–149)
VLDL Cholesterol Cal: 28 mg/dL (ref 5–40)

## 2020-11-20 ENCOUNTER — Telehealth: Payer: Self-pay | Admitting: Cardiology

## 2020-11-20 NOTE — Telephone Encounter (Signed)
Pt states that someone responded to his call with his results.

## 2020-11-20 NOTE — Telephone Encounter (Signed)
Patient returning call for lab results. 

## 2020-12-10 ENCOUNTER — Other Ambulatory Visit: Payer: Self-pay | Admitting: Cardiology

## 2021-02-05 ENCOUNTER — Other Ambulatory Visit: Payer: Self-pay | Admitting: Cardiology

## 2021-04-16 ENCOUNTER — Other Ambulatory Visit: Payer: Self-pay | Admitting: Cardiology

## 2021-04-21 IMAGING — CT CT HEART MORP W/ CTA COR W/ SCORE W/ CA W/CM &/OR W/O CM
4 of 7 series · 8 of 20 positions shown, 9 images · IV contrast (APPLIED)
Comparison: None.
COMPARISON: None.

Addendum:
EXAM:
OVER-READ INTERPRETATION  CT CHEST

The following report is an over-read performed by radiologist Dr.
Qroslav Uchikov [REDACTED] on 05/01/2020. This
over-read does not include interpretation of cardiac or coronary
anatomy or pathology. The coronary calcium score/coronary CTA
interpretation by the cardiologist is attached.
TECHNIQUE: The patient was scanned on a [employer] Force scanner.

[Series 6: best diast 68 % · axial · 0.39mm/px · z∈[+1168,+1217]mm · 2 of 368 slices shown, 3 images]
[im 123/368  vessel]
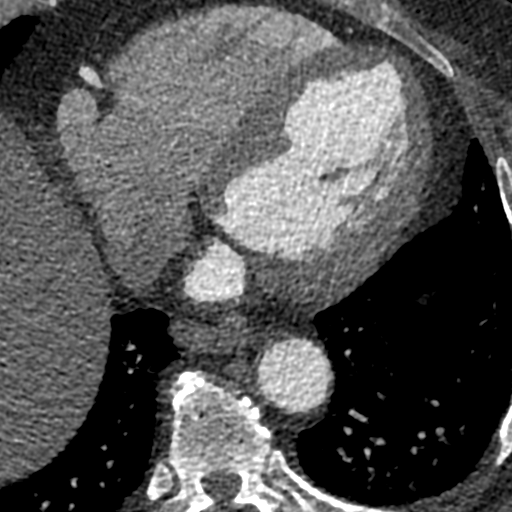
[im 123/368  lung]
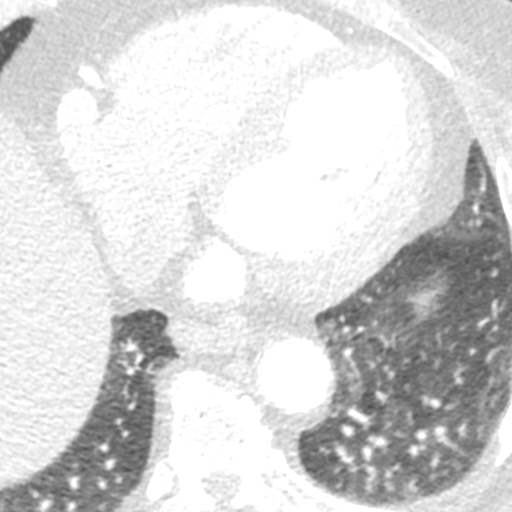
[im 245/368  vessel]
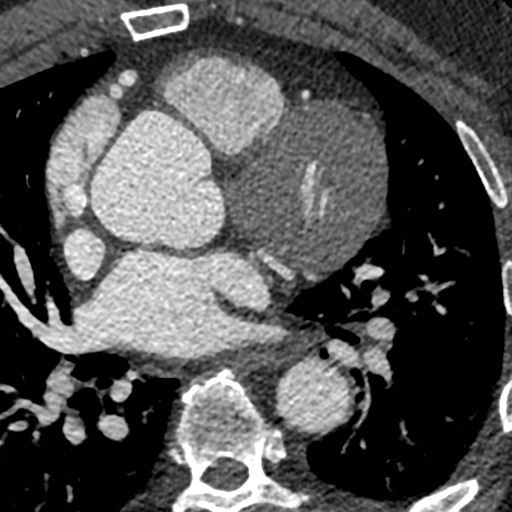

[Series 7: best syst 40 % · axial · 0.39mm/px · z∈[+1168,+1217]mm · 2 of 368 slices shown]
[im 123/368  vessel]
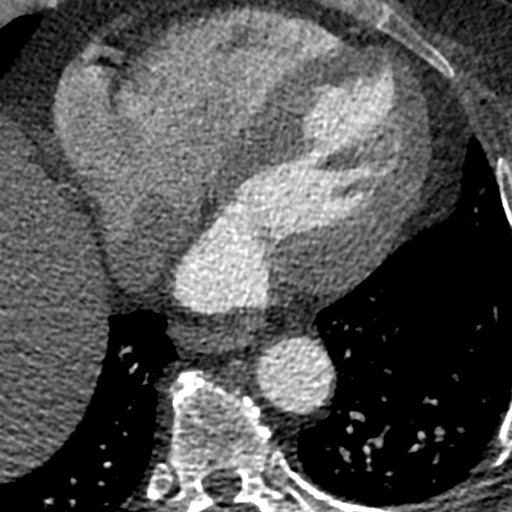
[im 245/368  vessel]
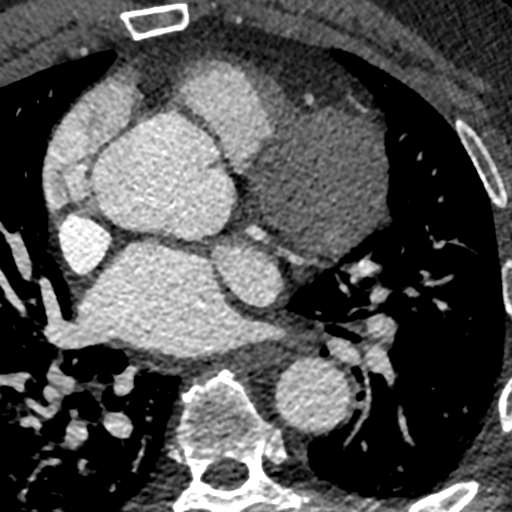

[Series 8: ts diast sharp 68 % · axial · 0.39mm/px · z∈[+1168,+1217]mm · 2 of 368 slices shown]
[im 123/368  lung]
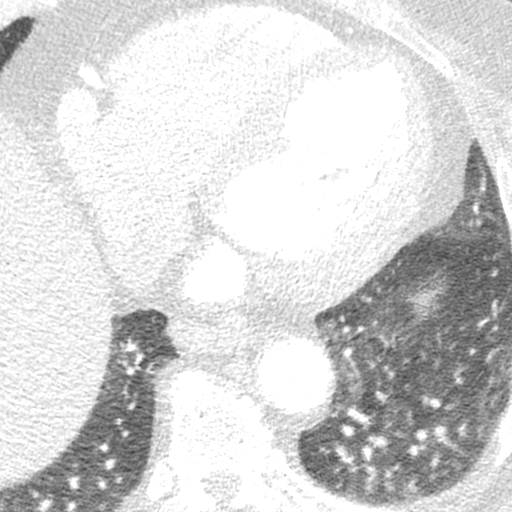
[im 245/368  lung]
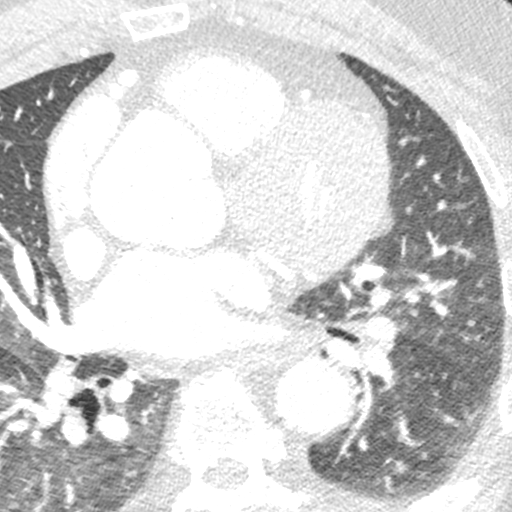

[Series 9: ts syst sharp 40 % · axial · 0.39mm/px · z∈[+1168,+1217]mm · 2 of 368 slices shown]
[im 123/368  lung]
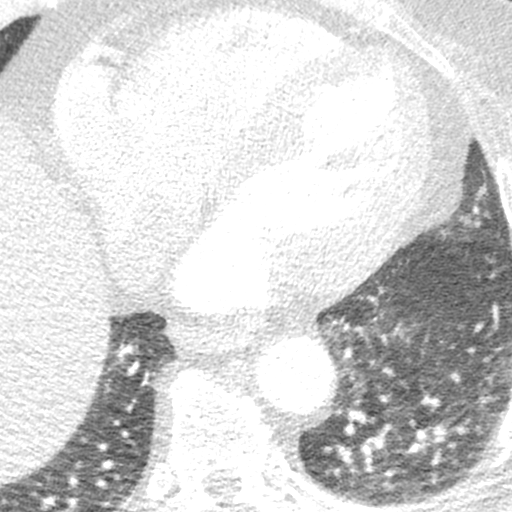
[im 245/368  lung]
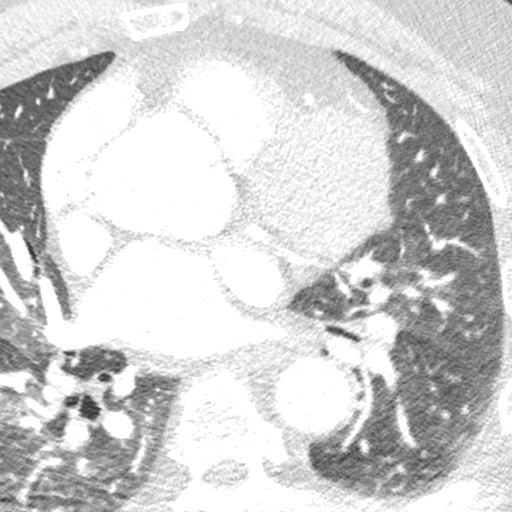

[8 of 20 positions shown; findings below may reference images not displayed]

FINDINGS: Aortic atherosclerosis. Within the visualized portions of the thorax
there are no suspicious appearing pulmonary nodules or masses, there
is no acute consolidative airspace disease, no pleural effusions, no
pneumothorax and no lymphadenopathy. Visualized portions of the
upper abdomen are unremarkable. There are no aggressive appearing
lytic or blastic lesions noted in the visualized portions of the
skeleton.
IMPRESSION: 1.  Aortic Atherosclerosis (ZATD0-UMB.B).

EXAM:
Cardiac/Coronary  CT
FINDINGS: A 120 kV prospective scan was triggered in the descending thoracic
aorta at 111 HU's. Axial non-contrast 3 mm slices were carried out
through the heart. The data set was analyzed on a dedicated work
station and scored using the Agatson method. Gantry rotation speed
was 250 msecs and collimation was .6 mm. No beta blockade and 0.8 mg
of sl NTG was given. The 3D data set was reconstructed in 5%
intervals of the 67-82 % of the R-R cycle. Diastolic phases were
analyzed on a dedicated work station using MPR, MIP and VRT modes.
The patient received 80 cc of contrast.

Aorta: Normal size. Scattered calcifications in the descending
aorta. No dissection.

Aortic Valve:  Trileaflet.  No calcifications.

Coronary Arteries:  Normal coronary origin.  Right dominance.

RCA is a large dominant artery that gives rise to PDA and PLVB.
There is no plaque.

Left main is a short artery that gives rise to LAD and LCX arteries.
There is no plaque.

LAD is a large vessel that gives rise to a moderate sized D1 and
large branching D2. There is mild calcified plaque in the mid LAD
after the takeoff of the second diagonal with associated stenosis of
25-49%. Study is sub optimal for evaluation of non calcified plaque
in the D1 and D2 or distal LAD due to significant noise artifact.

LCX is a non-dominant artery that gives rise to one large OM1 and
OM2 branches. There is no plaque in the proximal and mid LCx. Cannot
assess the OM1 and OM2 branches or distal LCx for non calcified
plaque due to significant noise artifact due to obesity. There is no
calcified plaque.

Other findings:

Normal pulmonary vein drainage into the left atrium.

Normal let atrial appendage without a thrombus.

Normal size of the pulmonary artery.
IMPRESSION: 1. Coronary calcium score of 15. This was 60th percentile for age
and sex matched control.

2.  Normal coronary origin with right dominance.

3.  Mild Atherosclerosis of the distal LAD.  CAD RADS 2.

4.  Consider non atherosclerotic causes of chest pain.

5.  Recommend preventive therapy and risk factor modification.

6. If clinical suspicion high, consider nuclear stress imaging given
sub optimal study for assessment of non calcified plaque in the
diagonal and OM branches as well as distal LAD and LCx.

Quirijn Amazigh

*** End of Addendum ***
EXAM:
OVER-READ INTERPRETATION  CT CHEST

The following report is an over-read performed by radiologist Dr.
Qroslav Uchikov [REDACTED] on 05/01/2020. This
over-read does not include interpretation of cardiac or coronary
anatomy or pathology. The coronary calcium score/coronary CTA
interpretation by the cardiologist is attached.
FINDINGS: Aortic atherosclerosis. Within the visualized portions of the thorax
there are no suspicious appearing pulmonary nodules or masses, there
is no acute consolidative airspace disease, no pleural effusions, no
pneumothorax and no lymphadenopathy. Visualized portions of the
upper abdomen are unremarkable. There are no aggressive appearing
lytic or blastic lesions noted in the visualized portions of the
skeleton.
IMPRESSION: 1.  Aortic Atherosclerosis (ZATD0-UMB.B).

## 2021-06-25 ENCOUNTER — Other Ambulatory Visit: Payer: Self-pay | Admitting: Cardiology

## 2021-09-19 ENCOUNTER — Other Ambulatory Visit: Payer: Self-pay | Admitting: Cardiology

## 2021-12-10 ENCOUNTER — Ambulatory Visit: Payer: 59 | Admitting: Cardiology

## 2022-01-14 ENCOUNTER — Other Ambulatory Visit: Payer: Self-pay | Admitting: Cardiology

## 2022-01-14 NOTE — Telephone Encounter (Signed)
Rx refill sent to pharmacy. 

## 2022-03-04 ENCOUNTER — Ambulatory Visit (INDEPENDENT_AMBULATORY_CARE_PROVIDER_SITE_OTHER): Payer: 59 | Admitting: Cardiology

## 2022-03-04 VITALS — BP 138/70 | HR 64 | Ht 72.0 in | Wt 273.4 lb

## 2022-03-04 DIAGNOSIS — I25118 Atherosclerotic heart disease of native coronary artery with other forms of angina pectoris: Secondary | ICD-10-CM | POA: Diagnosis not present

## 2022-03-04 DIAGNOSIS — I209 Angina pectoris, unspecified: Secondary | ICD-10-CM

## 2022-03-04 DIAGNOSIS — I10 Essential (primary) hypertension: Secondary | ICD-10-CM

## 2022-03-04 DIAGNOSIS — E785 Hyperlipidemia, unspecified: Secondary | ICD-10-CM | POA: Diagnosis not present

## 2022-03-04 NOTE — Patient Instructions (Signed)
Medication Instructions:  Your physician recommends that you continue on your current medications as directed. Please refer to the Current Medication list given to you today.  *If you need a refill on your cardiac medications before your next appointment, please call your pharmacy*   Lab Work: Your physician recommends that you return for lab work in:   Labs in 1 week: Lipid  If you have labs (blood work) drawn today and your tests are completely normal, you will receive your results only by: MyChart Message (if you have MyChart) OR A paper copy in the mail If you have any lab test that is abnormal or we need to change your treatment, we will call you to review the results.   Testing/Procedures: None   Follow-Up: At [facility], you and your health needs are our priority.  As part of our continuing mission to provide you with exceptional heart care, we have created designated Provider Care Teams.  These Care Teams include your primary Cardiologist (physician) and Advanced Practice Providers (APPs -  Physician Assistants and Nurse Practitioners) who all work together to provide you with the care you need, when you need it.  We recommend signing up for the patient portal called "MyChart".  Sign up information is provided on this After Visit Summary.  MyChart is used to connect with patients for Virtual Visits (Telemedicine).  Patients are able to view lab/test results, encounter notes, upcoming appointments, etc.  Non-urgent messages can be sent to your provider as well.   To learn more about what you can do with MyChart, go to [email or web address].    Your next appointment:   1 year(s)  The format for your next appointment:   In Person  Provider:   Gypsy Balsam, MD    Other Instructions None  Important Information About Sugar

## 2022-03-04 NOTE — Addendum Note (Signed)
Addended by: Roxanne Mins I on: 03/04/2022 04:54 PM   Modules accepted: Orders

## 2022-03-04 NOTE — Progress Notes (Signed)
Cardiology Office Note:    Date:  03/04/2022   ID:  Dale Mills, DOB 12/28/1965, MRN [id number]  PCP:  Nonnie Done., MD  Cardiologist:  Gypsy Balsam, MD    Referring MD: Nonnie Done., MD   Chief Complaint  Patient presents with   Follow-up    History of Present Illness:    Dale Mills is a 56 y.o. male with past medical history significant for typical angina pectoris.  He responded beautifully to medications we did coronary CT angio surprisingly showed only mild disease since the time I seen him last time he is doing great cardiac wise denies have any chest pain tightness squeezing pressure burning chest.  The problem seems to be his left hip which bothers him a lot.  Additional history include essential hypertension as well as dyslipidemia  Past Medical History:  Diagnosis Date   Angina pectoris (HCC) Canadian classification 2 04/10/2020   Coronary artery disease coronary CT angio did not show any major issues, suspicion for distal disease 06/12/2020   Dyslipidemia 02/13/2020   Essential hypertension 02/13/2020   Precordial chest pain 02/13/2020    Past Surgical History:  Procedure Laterality Date   TONSILECTOMY, ADENOIDECTOMY, BILATERAL MYRINGOTOMY AND TUBES  2011    Current Medications: Current Meds  Medication Sig   aspirin EC 81 MG tablet Take 81 mg by mouth daily. Swallow whole.   atenolol (TENORMIN) 25 MG tablet Take 12.5 mg by mouth daily.   atorvastatin (LIPITOR) 20 MG tablet Take 1 tablet by mouth once daily (Patient taking differently: Take 20 mg by mouth daily.)   lisinopril (ZESTRIL) 20 MG tablet Take 20 mg by mouth daily.   nitroGLYCERIN (NITROSTAT) 0.4 MG SL tablet Place 1 tablet (0.4 mg total) under the tongue every 5 (five) minutes as needed for chest pain.   ranolazine (RANEXA) 1000 MG SR tablet Take 1 tablet (1,000 mg total) by mouth daily.   tadalafil (CIALIS) 20 MG tablet Take 1 tablet by mouth every 3 (three) days.      Allergies:    Patient has no known allergies.   Social History   Socioeconomic History   Marital status: Divorced    Spouse name: Not on file   Number of children: Not on file   Years of education: Not on file   Highest education level: Not on file  Occupational History   Not on file  Tobacco Use   Smoking status: Never   Smokeless tobacco: Never  Substance and Sexual Activity   Alcohol use: Never   Drug use: Never   Sexual activity: Not on file  Other Topics Concern   Not on file  Social History Narrative   Not on file   Social Determinants of Health   Financial Resource Strain: Not on file  Food Insecurity: Not on file  Transportation Needs: Not on file  Physical Activity: Not on file  Stress: Not on file  Social Connections: Not on file     Family History: The patient's family history includes Heart attack in his father and paternal grandfather. ROS:   Please see the history of present illness.    All 14 point review of systems negative except as described per history of present illness  EKGs/Labs/Other Studies Reviewed:      Recent Labs: No results found for requested labs within last 365 days.  Recent Lipid Panel    Component Value Date/Time   CHOL 153 11/13/2020 1330   TRIG 161 (H) 11/13/2020  1330   HDL 29 (L) 11/13/2020 1330   CHOLHDL 5.3 (H) 11/13/2020 1330   LDLCALC 96 11/13/2020 1330    Physical Exam:    VS:  BP 138/70 (BP Location: Left Arm, Patient Position: Sitting)   Pulse 64   Ht 6' (1.829 m)   Wt 273 lb 6.4 oz (124 kg)   SpO2 95%   BMI 37.08 kg/m     Wt Readings from Last 3 Encounters:  03/04/22 273 lb 6.4 oz (124 kg)  11/13/20 269 lb (122 kg)  06/12/20 274 lb (124.3 kg)     GEN:  Well nourished, well developed in no acute distress HEENT: Normal NECK: No JVD; No carotid bruits LYMPHATICS: No lymphadenopathy CARDIAC: RRR, no murmurs, no rubs, no gallops RESPIRATORY:  Clear to auscultation without rales, wheezing or rhonchi  ABDOMEN: Soft,  non-tender, non-distended MUSCULOSKELETAL:  No edema; No deformity  SKIN: Warm and dry LOWER EXTREMITIES: no swelling NEUROLOGIC:  Alert and oriented x 3 PSYCHIATRIC:  Normal affect   ASSESSMENT:    1. Essential hypertension   2. Angina pectoris (HCC) Canadian classification 2   3. Coronary artery disease of native artery of native heart with stable angina pectoris (HCC)   4. Dyslipidemia    PLAN:    In order of problems listed above:  Essential hypertension blood pressure well controlled continue present management And pectoralis denies having any doing well Coronary artery disease only mild disease based on echo coronary CT angio.  Continue risk factors modifications Dyslipidemia I did review his K PN which show me his LDL of 96 HDL 29.  We will recheck his fasting lipid profile   Medication Adjustments/Labs and Tests Ordered: Current medicines are reviewed at length with the patient today.  Concerns regarding medicines are outlined above.  No orders of the defined types were placed in this encounter.  Medication changes: No orders of the defined types were placed in this encounter.   Signed, Georgeanna Lea, MD, [facility] 03/04/2022 4:45 PM    [facility]

## 2022-03-07 NOTE — Addendum Note (Signed)
Addended by: Cortlyn Cannell R on: 03/07/2022 01:12 PM   Modules accepted: Orders  

## 2022-03-12 LAB — LIPID PANEL
Chol/HDL Ratio: 4.7 ratio (ref 0.0–5.0)
Cholesterol, Total: 156 mg/dL (ref 100–199)
HDL: 33 mg/dL — ABNORMAL LOW (ref >39)
LDL Chol Calc (NIH): 100 mg/dL — ABNORMAL HIGH (ref 0–99)
Triglycerides: 128 mg/dL (ref 0–149)
VLDL Cholesterol Cal: 23 mg/dL (ref 5–40)

## 2022-03-17 ENCOUNTER — Telehealth: Payer: Self-pay

## 2022-03-17 DIAGNOSIS — E785 Hyperlipidemia, unspecified: Secondary | ICD-10-CM

## 2022-03-17 MED ORDER — ATORVASTATIN CALCIUM 40 MG PO TABS: 40.0000 mg | ORAL_TABLET | Freq: Every day | ORAL | 2 refills | Status: DC

## 2022-03-17 NOTE — Telephone Encounter (Signed)
-----   Message from Georgeanna Lea, MD sent at 03/14/2022  8:22 PM EDT ----- Increase atorvastatin to 40 mg daily, fasting lipid profile, AST LT 6 weeks

## 2022-03-17 NOTE — Telephone Encounter (Signed)
Pt is returning call. Transferred to [place], Tiburcio Pea.

## 2022-03-17 NOTE — Telephone Encounter (Signed)
Unable to reach the patient. I mailed a letter requesting a call back.  

## 2022-03-17 NOTE — Telephone Encounter (Signed)
Patient notified of results and recommendations and agreed with plan. Rx sent lab order on file, he is aware to fast for blood work.

## 2022-03-17 NOTE — Addendum Note (Signed)
Addended by: Heywood Bene on: 03/17/2022 03:32 PM   Modules accepted: Orders

## 2022-04-15 ENCOUNTER — Other Ambulatory Visit: Payer: Self-pay | Admitting: Cardiology

## 2022-04-15 NOTE — Telephone Encounter (Signed)
Refills to pharmacy 

## 2022-04-22 ENCOUNTER — Other Ambulatory Visit: Payer: Self-pay

## 2022-04-22 DIAGNOSIS — E785 Hyperlipidemia, unspecified: Secondary | ICD-10-CM

## 2022-04-26 LAB — LIPID PANEL
Chol/HDL Ratio: 4.1 ratio (ref 0.0–5.0)
Cholesterol, Total: 145 mg/dL (ref 100–199)
HDL: 35 mg/dL — ABNORMAL LOW (ref >39)
LDL Chol Calc (NIH): 94 mg/dL (ref 0–99)
Triglycerides: 82 mg/dL (ref 0–149)
VLDL Cholesterol Cal: 16 mg/dL (ref 5–40)

## 2022-04-26 LAB — ALT: ALT: 23 IU/L (ref 0–44)

## 2022-04-26 LAB — AST: AST: 29 IU/L (ref 0–40)

## 2022-05-02 ENCOUNTER — Telehealth: Payer: Self-pay

## 2022-05-02 MED ORDER — ATORVASTATIN CALCIUM 80 MG PO TABS: 80.0000 mg | ORAL_TABLET | Freq: Every day | ORAL | 3 refills | Status: DC

## 2022-05-02 NOTE — Telephone Encounter (Signed)
Results reviewed with pt as per Dr. Krasowski's note.  Pt verbalized understanding and had no additional questions. Routed to PCP  

## 2022-05-02 NOTE — Telephone Encounter (Signed)
LVM for pt to call regarding lab results 

## 2022-07-08 ENCOUNTER — Telehealth: Payer: Self-pay | Admitting: *Deleted

## 2022-07-08 DIAGNOSIS — E785 Hyperlipidemia, unspecified: Secondary | ICD-10-CM

## 2022-07-08 DIAGNOSIS — I25118 Atherosclerotic heart disease of native coronary artery with other forms of angina pectoris: Secondary | ICD-10-CM

## 2022-07-08 DIAGNOSIS — I10 Essential (primary) hypertension: Secondary | ICD-10-CM

## 2022-07-08 LAB — LIPID PANEL
Chol/HDL Ratio: 3.5 ratio (ref 0.0–5.0)
Cholesterol, Total: 130 mg/dL (ref 100–199)
HDL: 37 mg/dL — ABNORMAL LOW (ref >39)
LDL Chol Calc (NIH): 76 mg/dL (ref 0–99)
Triglycerides: 90 mg/dL (ref 0–149)
VLDL Cholesterol Cal: 17 mg/dL (ref 5–40)

## 2022-07-08 LAB — ALT: ALT: 21 IU/L (ref 0–44)

## 2022-07-08 LAB — AST: AST: 19 IU/L (ref 0–40)

## 2022-07-08 NOTE — Telephone Encounter (Signed)
Pt came in for lab work per Dr. Bing Matter and a Lipid Panel, AST and ALT were ordered for pt.

## 2022-07-15 ENCOUNTER — Telehealth: Payer: Self-pay

## 2022-07-15 NOTE — Telephone Encounter (Signed)
Patient notified of results.

## 2022-07-15 NOTE — Telephone Encounter (Signed)
-----   Message from Park Liter, MD sent at 07/12/2022 12:39 PM EST ----- Colace acceptable, present management

## 2022-07-15 NOTE — Telephone Encounter (Signed)
-----   Message from Robert J Krasowski, MD sent at 07/12/2022 12:39 PM EST ----- Colace acceptable, present management 

## 2022-10-03 ENCOUNTER — Telehealth: Payer: Self-pay | Admitting: Cardiology

## 2022-10-03 ENCOUNTER — Telehealth: Payer: Self-pay | Admitting: *Deleted

## 2022-10-03 ENCOUNTER — Telehealth: Payer: Self-pay

## 2022-10-03 NOTE — Telephone Encounter (Signed)
Dale Mills NJust now (5:01 PM)   [facility] Patient is returning call to schedule pre-op tele-visit. Please advise.      Note

## 2022-10-03 NOTE — Telephone Encounter (Signed)
I s/w the pt and he has been scheduled for a tele pre op appt 10/06/22 @ 4:10. Pt works daily from [employer]. Med rec and consent are done.      Patient Consent for Virtual Visit        Dale Mills has provided verbal consent on 10/03/2022 for a virtual visit (video or telephone).   CONSENT FOR VIRTUAL VISIT FOR:  Dale Mills  By participating in this virtual visit I agree to the following:  I hereby voluntarily request, consent and authorize [facility] and its employed or contracted physicians, physician assistants, nurse practitioners or other licensed health care professionals (the Practitioner), to provide me with telemedicine health care services (the "Services") as deemed necessary by the treating Practitioner. I acknowledge and consent to receive the Services by the Practitioner via telemedicine. I understand that the telemedicine visit will involve communicating with the Practitioner through live audiovisual communication technology and the disclosure of certain medical information by electronic transmission. I acknowledge that I have been given the opportunity to request an in-person assessment or other available alternative prior to the telemedicine visit and am voluntarily participating in the telemedicine visit.  I understand that I have the right to withhold or withdraw my consent to the use of telemedicine in the course of my care at any time, without affecting my right to future care or treatment, and that the Practitioner or I may terminate the telemedicine visit at any time. I understand that I have the right to inspect all information obtained and/or recorded in the course of the telemedicine visit and may receive copies of available information for a reasonable fee.  I understand that some of the potential risks of receiving the Services via telemedicine include:  Delay or interruption in medical evaluation due to technological equipment failure or  disruption; Information transmitted may not be sufficient (e.g. poor resolution of images) to allow for appropriate medical decision making by the Practitioner; and/or  In rare instances, security protocols could fail, causing a breach of personal health information.  Furthermore, I acknowledge that it is my responsibility to provide information about my medical history, conditions and care that is complete and accurate to the best of my ability. I acknowledge that Practitioner's advice, recommendations, and/or decision may be based on factors not within their control, such as incomplete or inaccurate data provided by me or distortions of diagnostic images or specimens that may result from electronic transmissions. I understand that the practice of medicine is not an exact science and that Practitioner makes no warranties or guarantees regarding treatment outcomes. I acknowledge that a copy of this consent can be made available to me via my patient portal ([facility]), or I can request a printed copy by calling the office of [place].    I understand that my insurance will be billed for this visit.   I have read or had this consent read to me. I understand the contents of this consent, which adequately explains the benefits and risks of the Services being provided via telemedicine.  I have been provided ample opportunity to ask questions regarding this consent and the Services and have had my questions answered to my satisfaction. I give my informed consent for the services to be provided through the use of telemedicine in my medical care

## 2022-10-03 NOTE — Telephone Encounter (Signed)
Patient is returning call to schedule preop tele visit. Please advise.  

## 2022-10-03 NOTE — Telephone Encounter (Signed)
Left message to call back to schedule a tele pre op appt.  ?

## 2022-10-03 NOTE — Telephone Encounter (Signed)
I s/w the pt and he has been scheduled for a tele pre op appt 10/06/22 @ 4:10. Pt works daily from [employer]. Med rec and consent are done.

## 2022-10-03 NOTE — Telephone Encounter (Signed)
Primary Cardiologist:Robert Agustin Cree, MD   Preoperative team, please contact this patient and set up a phone call appointment for further preoperative risk assessment. Please obtain consent and complete medication review. Thank you for your help.   Pending no s/s of ACS and per office protocol, he may hold aspirin for 5-7 days prior to procedure and should resume as soon as hemodynamically stable postoperatively.   Emmaline Life, NP-C  10/03/2022, 10:00 AM [address] Office [phone] Fax [phone]

## 2022-10-03 NOTE — Telephone Encounter (Signed)
   Pre-operative Risk Assessment    Patient Name: Dale Mills  DOB: 1965/07/16 MRN: [id number]      Request for Surgical Clearance    Procedure:   left total hip arthroplasty  Date of Surgery:  Clearance TBD                                 Surgeon:  Dr. Creig Hines Surgeon's Group or Practice Name:  [facility] Phone number:  [phone] Fax number:  [phone]   Type of Clearance Requested:   - Medical    Type of Anesthesia:  General    Additional requests/questions:    SignedLowella Grip   10/03/2022, 7:51 AM

## 2022-10-06 ENCOUNTER — Ambulatory Visit: Payer: 59 | Attending: Cardiology | Admitting: Nurse Practitioner

## 2022-10-06 DIAGNOSIS — Z01810 Encounter for preprocedural cardiovascular examination: Secondary | ICD-10-CM

## 2022-10-06 NOTE — Progress Notes (Signed)
Virtual Visit via Telephone Note   Because of Dale Mills's co-morbid illnesses, he is at least at moderate risk for complications without adequate follow up.  This format is felt to be most appropriate for this patient at this time.  The patient did not have access to video technology/had technical difficulties with video requiring transitioning to audio format only (telephone).  All issues noted in this document were discussed and addressed.  No physical exam could be performed with this format.  Please refer to the patient's chart for his consent to telehealth for Dale Mills.  Evaluation Performed:  Preoperative cardiovascular risk assessment _____________   Date:  10/06/2022   Patient ID:  Dale Mills, DOB Aug 25, 1965, MRN [id number] Patient Location:  Home Provider location:   Office  Primary Care Provider:  Enid Skeens., MD Primary Cardiologist:  Jenne Campus, MD  Chief Complaint / Patient Profile   57 y.o. y/o male with a h/o mild nonobstructive CAD, hypertension, and hyperlipidemia who is pending left total hip arthroplasty with Dr. Creig Hines of [facility] and presents today for telephonic preoperative cardiovascular risk assessment.  History of Present Illness    Dale Mills is a 57 y.o. male who presents via audio/video conferencing for a telehealth visit today.  Pt was last seen in cardiology clinic on 8/25/203 by Dr. Agustin Cree.  At that time Dale Mills was doing well. The patient is now pending procedure as outlined above. Since his last visit, he has done well from a cardiac standpoint. His mobility is limited in the setting of orthopedic pain.   He denies chest pain, palpitations, dyspnea, pnd, orthopnea, n, v, dizziness, syncope, edema, weight gain, or early satiety. All other systems reviewed and are otherwise negative except as noted above.   Past Medical History    Past Medical History:   Diagnosis Date   Angina pectoris ([place]) Canadian classification 2 04/10/2020   Coronary artery disease coronary CT angio did not show any major issues, suspicion for distal disease 06/12/2020   Dyslipidemia 02/13/2020   Essential hypertension 02/13/2020   Precordial chest pain 02/13/2020   Past Surgical History:  Procedure Laterality Date   TONSILECTOMY, ADENOIDECTOMY, BILATERAL MYRINGOTOMY AND TUBES  2011    Allergies  No Known Allergies  Home Medications    Prior to Admission medications   Medication Sig Start Date End Date Taking? Authorizing Provider  aspirin EC 81 MG tablet Take 81 mg by mouth daily. Swallow whole.    [provider]  atenolol (TENORMIN) 25 MG tablet Take 12.5 mg by mouth daily. 02/07/20   [provider]  atorvastatin (LIPITOR) 80 MG tablet Take 1 tablet (80 mg total) by mouth daily. 05/02/22   Park Liter, MD  lisinopril (ZESTRIL) 20 MG tablet Take 20 mg by mouth daily. 11/30/19   [provider]  nitroGLYCERIN (NITROSTAT) 0.4 MG SL tablet Place 1 tablet (0.4 mg total) under the tongue every 5 (five) minutes as needed for chest pain. 02/13/20 10/03/22  Park Liter, MD  ranolazine (RANEXA) 1000 MG SR tablet Take 1 tablet by mouth once daily 04/15/22   Park Liter, MD  tadalafil (CIALIS) 20 MG tablet Take 1 tablet by mouth every 3 (three) days.  12/23/19   [provider]    Physical Exam    Vital Signs:  Dale Mills does not have vital signs available for review today.  Given telephonic nature of communication, physical  exam is limited. AAOx3. NAD. Normal affect.  Speech and respirations are unlabored.  Accessory Clinical Findings    None  Assessment & Plan    1.  Preoperative Cardiovascular Risk Assessment:  According to the Revised Cardiac Risk Index (RCRI), his Perioperative Risk of Major Cardiac Event is (%): 0.4. His Functional Capacity in METs is: 5.93 according to the [place] Activity Status Index  (DASI). Therefore, based on ACC/AHA guidelines, patient would be at acceptable risk for the planned procedure without further cardiovascular testing.  The patient was advised that if he develops new symptoms prior to surgery to contact our office to arrange for a follow-up visit, and he verbalized understanding.  Per office protocol, he may hold Aspirin for 5-7 days prior to procedure. Please resume Aspirin as soon as possible postprocedure, at the discretion of the surgeon.    A copy of this note will be routed to requesting surgeon.  Time:   Today, I have spent 5. minutes with the patient with telehealth technology discussing medical history, symptoms, and management plan.     Lenna Sciara, NP  10/06/2022, 4:18 PM

## 2022-10-26 DIAGNOSIS — Z01818 Encounter for other preprocedural examination: Secondary | ICD-10-CM

## 2023-01-28 DIAGNOSIS — I517 Cardiomegaly: Secondary | ICD-10-CM | POA: Diagnosis not present

## 2023-03-16 ENCOUNTER — Other Ambulatory Visit: Payer: Self-pay | Admitting: Oncology

## 2023-03-16 DIAGNOSIS — D649 Anemia, unspecified: Secondary | ICD-10-CM

## 2023-03-16 NOTE — Progress Notes (Signed)
[facility]  [address] [place],  [place]  [address] [phone]  Clinic Day:  03/17/2023  Referring physician: Nonnie Done., MD   HISTORY OF PRESENT ILLNESS:  The patient is a 57 y.o. male  who I was asked to consult upon for anemia.  Labs in August 2024 showed a low hemoglobin of 10.7, with a low MCV of 79.  However, his iron studies all came back normal, including a ferritin of 211, a serum iron of 48, a low TIBC of 247 and an iron saturation of 19%.  He also had normal B12 and folate levels.  According to the patient, he denies having any overt forms of blood loss.  He claims a colonoscopy 4 years revealed 3 polyps.  He denies being placed on any new medications which have the ability to cause anemia via bone marrow suppression.  To his knowledge, there is no family history of anemia or other hematologic disorders.  PAST MEDICAL HISTORY:   Past Medical History:  Diagnosis Date   Angina pectoris (HCC) Canadian classification 2 04/10/2020   Anxiety    Arthritis    Coronary artery disease coronary CT angio did not show any major issues, suspicion for distal disease 06/12/2020   Depression    DJD (degenerative joint disease)    Dyslipidemia 02/13/2020   Essential hypertension 02/13/2020   Precordial chest pain 02/13/2020    PAST SURGICAL HISTORY:   Past Surgical History:  Procedure Laterality Date   COLONOSCOPY W/ POLYPECTOMY     FOREARM SURGERY     MASS REMOVED   HIP ARTHROSCOPY Left    LASIK Bilateral    TONSILECTOMY, ADENOIDECTOMY, BILATERAL MYRINGOTOMY AND TUBES  2011   UMBILICAL HERNIA REPAIR     VASECTOMY      CURRENT MEDICATIONS:   Current Outpatient Medications  Medication Sig Dispense Refill   aspirin EC 81 MG tablet Take 81 mg by mouth daily. Swallow whole.     atenolol (TENORMIN) 25 MG tablet Take 12.5 mg by mouth daily.     atorvastatin (LIPITOR) 80 MG tablet Take 1 tablet (80 mg total) by mouth daily. 90 tablet 3    lisinopril (ZESTRIL) 20 MG tablet Take 20 mg by mouth daily.     nitroGLYCERIN (NITROSTAT) 0.4 MG SL tablet Place 1 tablet (0.4 mg total) under the tongue every 5 (five) minutes as needed for chest pain. 25 tablet 11   ranolazine (RANEXA) 1000 MG SR tablet Take 1 tablet by mouth once daily 90 tablet 3   tadalafil (CIALIS) 20 MG tablet Take 1 tablet by mouth every 3 (three) days.      No current facility-administered medications for this visit.    ALLERGIES:  No Known Allergies  FAMILY HISTORY:   Family History  Problem Relation Age of Onset   CAD Mother    Kidney failure Mother    Heart attack Father    Heart attack Paternal Grandfather    Cancer Child        RARE BONE CANCER RESULTING IN TOTAL AMPUTATION OF LIMB    SOCIAL HISTORY:  The patient was born and raised in [place].  He currently [place] in the [facility] [place].  He is divorced, with 2 children.  He has worked at a Product/process development scientist for 4-1/2 years.  There is no history of alcoholism or tobacco abuse.  REVIEW OF SYSTEMS:  Review of Systems  Constitutional:  Positive for appetite change, chills and unexpected weight  change. Negative for fatigue and fever.  Respiratory:  Negative for chest tightness, cough, hemoptysis and shortness of breath.   Cardiovascular:  Negative for chest pain and palpitations.  Gastrointestinal:  Positive for nausea. Negative for abdominal distention, abdominal pain, blood in stool, constipation, diarrhea and vomiting.  Genitourinary:  Positive for dysuria. Negative for frequency and hematuria.   Musculoskeletal:  Positive for arthralgias and gait problem. Negative for back pain and myalgias.  Skin:  Negative for itching and rash.  Neurological:  Positive for gait problem. Negative for dizziness, headaches and light-headedness.  Psychiatric/Behavioral:  Negative for depression and suicidal ideas. The patient is not nervous/anxious.     PHYSICAL EXAM:  Blood pressure (!) 119/55, pulse  75, temperature (!) 97.5 F (36.4 C), resp. rate 18, height 5\' 11"  (1.803 m), weight 249 lb 3.2 oz (113 kg), SpO2 98%. Wt Readings from Last 3 Encounters:  03/17/23 249 lb 3.2 oz (113 kg)  03/04/22 273 lb 6.4 oz (124 kg)  11/13/20 269 lb (122 kg)   Body mass index is 34.76 kg/m. Performance status (ECOG): 0 - Asymptomatic Physical Exam Constitutional:      Appearance: Normal appearance. He is not ill-appearing.  HENT:     Mouth/Throat:     Mouth: Mucous membranes are moist.     Pharynx: Oropharynx is clear. No oropharyngeal exudate or posterior oropharyngeal erythema.  Cardiovascular:     Rate and Rhythm: Normal rate and regular rhythm.     Heart sounds: No murmur heard.    No friction rub. No gallop.  Pulmonary:     Effort: Pulmonary effort is normal. No respiratory distress.     Breath sounds: Normal breath sounds. No wheezing, rhonchi or rales.  Abdominal:     General: Bowel sounds are normal. There is no distension.     Palpations: Abdomen is soft. There is no mass.     Tenderness: There is no abdominal tenderness.  Musculoskeletal:        General: No swelling.     Right lower leg: No edema.     Left lower leg: No edema.  Lymphadenopathy:     Cervical: No cervical adenopathy.     Upper Body:     Right upper body: No supraclavicular or axillary adenopathy.     Left upper body: No supraclavicular or axillary adenopathy.     Lower Body: No right inguinal adenopathy. No left inguinal adenopathy.  Skin:    General: Skin is warm.     Coloration: Skin is not jaundiced.     Findings: No lesion or rash.  Neurological:     General: No focal deficit present.     Mental Status: He is alert and oriented to person, place, and time. Mental status is at baseline.  Psychiatric:        Mood and Affect: Mood normal.        Behavior: Behavior normal.        Thought Content: Thought content normal.    LABS:      Latest Ref Rng & Units 03/17/2023   12:00 AM  CBC  WBC  12.1       Hemoglobin 13.5 - 17.5 10.2      Hematocrit 41 - 53 31      Platelets 150 - 400 K/uL 255         This result is from an external source.      Latest Ref Rng & Units 03/17/2023    8:51 AM 07/08/2022  9:13 AM 04/22/2022    9:25 AM  CMP  Glucose 70 - 99 mg/dL 657     BUN 6 - 20 mg/dL 21     Creatinine 8.46 - 1.24 mg/dL 9.62     Sodium 952 - 841 mmol/L 132     Potassium 3.5 - 5.1 mmol/L 4.1     Chloride 98 - 111 mmol/L 99     CO2 22 - 32 mmol/L 23     Calcium 8.9 - 10.3 mg/dL 8.8     Total Protein 6.5 - 8.1 g/dL 6.9     Total Bilirubin 0.3 - 1.2 mg/dL 1.0     Alkaline Phos 38 - 126 U/L 122     AST 15 - 41 U/L 23  19  29    ALT 0 - 44 U/L 27  21  23        Latest Reference Range & Units 03/17/23 10:09  Iron 45 - 182 ug/dL 29 (L)  UIBC ug/dL 324  TIBC 401 - 027 ug/dL 253  Saturation Ratios 17.9 - 39.5 % 11 (L)  Ferritin 24 - 336 ng/mL 120  Transferrin Receptor 12.2 - 27.3 nmol/L 17.2  (L): Data is abnormally low  ASSESSMENT & PLAN:  A 57 y.o. male who I was asked to consult upon for microcytic anemia.  When looking at his entire iron profile, it is not overly convincing for iron deficiency anemia.  Although he does have a low serum iron, his ferritin and transferrin receptor levels are normal.  However, his CBC today showed a low MCV of 76.  A CBC in April 2024 showed a hemoglobin of 14.3, with a normal MCV of 88.  With such a big change in those values, I do believe he does have iron deficiency anemia to where IV iron is necessary.  Although he claims to have had a colonoscopy 4 years ago, he may ultimately need another GI workup in the forthcoming weeks/months to ensure no new GI tract process has developed.  Otherwise, I will see this patient back in 2 months to reassess his iron and hemoglobin levels to see how well he responded to his upcoming IV iron.  The patient understands all the plans discussed today and is in agreement with them.  I do appreciate [place], Excell Seltzer., MD for  his new consult.   Hadja Harral Kirby Funk, MD

## 2023-03-17 ENCOUNTER — Telehealth: Payer: Self-pay

## 2023-03-17 ENCOUNTER — Inpatient Hospital Stay: Payer: Medicaid Other

## 2023-03-17 ENCOUNTER — Inpatient Hospital Stay: Payer: Medicaid Other | Attending: Oncology | Admitting: Oncology

## 2023-03-17 ENCOUNTER — Encounter: Payer: Self-pay | Admitting: Oncology

## 2023-03-17 ENCOUNTER — Other Ambulatory Visit: Payer: Self-pay | Admitting: Oncology

## 2023-03-17 VITALS — BP 119/55 | HR 75 | Temp 97.5°F | Resp 18 | Ht 71.0 in | Wt 249.2 lb

## 2023-03-17 DIAGNOSIS — D509 Iron deficiency anemia, unspecified: Secondary | ICD-10-CM | POA: Insufficient documentation

## 2023-03-17 DIAGNOSIS — D649 Anemia, unspecified: Secondary | ICD-10-CM

## 2023-03-17 DIAGNOSIS — Z808 Family history of malignant neoplasm of other organs or systems: Secondary | ICD-10-CM

## 2023-03-17 LAB — CMP (CANCER CENTER ONLY)
ALT: 27 U/L (ref 0–44)
AST: 23 U/L (ref 15–41)
Albumin: 3.1 g/dL — ABNORMAL LOW (ref 3.5–5.0)
Alkaline Phosphatase: 122 U/L (ref 38–126)
Anion gap: 10 (ref 5–15)
BUN: 21 mg/dL — ABNORMAL HIGH (ref 6–20)
CO2: 23 mmol/L (ref 22–32)
Calcium: 8.8 mg/dL — ABNORMAL LOW (ref 8.9–10.3)
Chloride: 99 mmol/L (ref 98–111)
Creatinine: 1.38 mg/dL — ABNORMAL HIGH (ref 0.61–1.24)
GFR, Estimated: 60 mL/min — ABNORMAL LOW (ref >60)
Glucose, Bld: 106 mg/dL — ABNORMAL HIGH (ref 70–99)
Potassium: 4.1 mmol/L (ref 3.5–5.1)
Sodium: 132 mmol/L — ABNORMAL LOW (ref 135–145)
Total Bilirubin: 1 mg/dL (ref 0.3–1.2)
Total Protein: 6.9 g/dL (ref 6.5–8.1)

## 2023-03-17 LAB — CBC AND DIFFERENTIAL
HCT: 31 — AB (ref 41–53)
Hemoglobin: 10.2 — AB (ref 13.5–17.5)
Neutrophils Absolute: 10.29
Platelets: 255 10*3/uL (ref 150–400)
WBC: 12.1

## 2023-03-17 LAB — IRON AND TIBC
Iron: 29 ug/dL — ABNORMAL LOW (ref 45–182)
Saturation Ratios: 11 % — ABNORMAL LOW (ref 17.9–39.5)
TIBC: 255 ug/dL (ref 250–450)
UIBC: 226 ug/dL

## 2023-03-17 LAB — CBC: RBC: 4.09 (ref 3.87–5.11)

## 2023-03-17 LAB — FERRITIN: Ferritin: 120 ng/mL (ref 24–336)

## 2023-03-17 NOTE — Telephone Encounter (Signed)
Dr Melvyn Neth asked me to call Dr Molinda Bailiff office to report the persistent non productive cough x 6 months. The pt is taking lisinopril daily and Dr Melvyn Neth wants to see if Dr Egbert Garibaldi thinks its worth a trial off the drug to see if cough improves. Pt denies SOB.

## 2023-03-17 NOTE — Telephone Encounter (Signed)
Dr Melvyn Neth reviewed labs below and responded, "Those labs aren't bad. Tell him I have ordered another confirmatory test that isn't back yet. It will help to make decision".    Latest Reference Range & Units 03/17/23 10:09  Iron 45 - 182 ug/dL 29 (L)  UIBC ug/dL 299  TIBC 371 - 696 ug/dL 789  Saturation Ratios 17.9 - 39.5 % 11 (L)  Ferritin 24 - 336 ng/mL 120  (L): Data is abnormally low

## 2023-03-19 LAB — SOLUBLE TRANSFERRIN RECEPTOR: Transferrin Receptor: 17.2 nmol/L (ref 12.2–27.3)

## 2023-03-20 ENCOUNTER — Encounter: Payer: Self-pay | Admitting: Oncology

## 2023-03-20 ENCOUNTER — Other Ambulatory Visit: Payer: Self-pay | Admitting: Oncology

## 2023-03-20 DIAGNOSIS — D508 Other iron deficiency anemias: Secondary | ICD-10-CM

## 2023-03-20 DIAGNOSIS — D509 Iron deficiency anemia, unspecified: Secondary | ICD-10-CM | POA: Insufficient documentation

## 2023-03-20 HISTORY — DX: Iron deficiency anemia, unspecified: D50.9

## 2023-03-20 LAB — PROTEIN ELECTROPHORESIS, SERUM
A/G Ratio: 0.8 (ref 0.7–1.7)
Albumin ELP: 2.8 g/dL — ABNORMAL LOW (ref 2.9–4.4)
Alpha-1-Globulin: 0.4 g/dL (ref 0.0–0.4)
Alpha-2-Globulin: 1 g/dL (ref 0.4–1.0)
Beta Globulin: 0.8 g/dL (ref 0.7–1.3)
Gamma Globulin: 1 g/dL (ref 0.4–1.8)
Globulin, Total: 3.3 g/dL (ref 2.2–3.9)
Total Protein ELP: 6.1 g/dL (ref 6.0–8.5)

## 2023-03-21 ENCOUNTER — Telehealth: Payer: Self-pay | Admitting: Oncology

## 2023-03-21 NOTE — Telephone Encounter (Signed)
Contacted pt to schedule an appt. Unable to reach via phone, voicemail was left.   Scheduling Message Entered by Rennis Harding A on 03/17/2023 at  5:15 PM Priority: Routine <No visit type provided>  Department: [facility]  Provider:  Scheduling Notes:  Labs/appt 05-17-23     Scheduling Message Entered by Romona Curls, AMY W on 03/20/2023 at  8:30 AM Priority: High INFUSION 1HR30MIN (90)  Department: [facility]  Provider: Weston Settle, MD  Appointment Notes:  Lewis wants pt to have iron infusions

## 2023-03-22 ENCOUNTER — Inpatient Hospital Stay: Payer: Medicaid Other

## 2023-03-22 NOTE — Progress Notes (Signed)
[facility] Clinical Social Work  Clinical Social Work was referred by medical provider for assessment of psychosocial needs.  Clinical Social Worker contacted patient by phone to offer support and assess for needs.  Patient reported to CSW a decrease in income due to health conditions impacting his ability to work. Patient informed CSW that the decrease in income has led to challenges with paying for groceries. Patient has not applied for SSDI and expressed interest in starting that process. CSW provided information on the servant center organization and explained what would need to take place. CSW will find out if verbal authorizations for applications are sufficient due to distance with traveling, if not CSW will leave a application for patient to pick up on 03/31/2023 along with resources for food.   Patient inquired about information regarding cost for prescribed iron medication. Patient reported that he spoke with someone named Claris Che at the CC. CSW message patient's nurse and social worker Delorise Jackson for additional information. CSW will update patient with information when possible.   Marguerita Merles, LCSWA Clinical Social Worker [facility]

## 2023-03-28 ENCOUNTER — Inpatient Hospital Stay (HOSPITAL_COMMUNITY)
Admission: EM | Admit: 2023-03-28 | Discharge: 2023-04-09 | DRG: 853 | Disposition: A | Discharge status: Rehabilitation Facility | Payer: Medicaid Other | Source: Other Acute Inpatient Hospital | Attending: Thoracic Surgery (Cardiothoracic Vascular Surgery) | Admitting: Thoracic Surgery (Cardiothoracic Vascular Surgery)

## 2023-03-28 ENCOUNTER — Inpatient Hospital Stay (HOSPITAL_COMMUNITY): Payer: Medicaid Other

## 2023-03-28 ENCOUNTER — Telehealth: Payer: Self-pay | Admitting: Oncology

## 2023-03-28 ENCOUNTER — Encounter (HOSPITAL_COMMUNITY): Payer: Self-pay

## 2023-03-28 ENCOUNTER — Other Ambulatory Visit: Payer: Self-pay

## 2023-03-28 ENCOUNTER — Encounter (HOSPITAL_COMMUNITY): Payer: Self-pay | Admitting: Internal Medicine

## 2023-03-28 DIAGNOSIS — I82C12 Acute embolism and thrombosis of left internal jugular vein: Secondary | ICD-10-CM | POA: Diagnosis not present

## 2023-03-28 DIAGNOSIS — N179 Acute kidney failure, unspecified: Secondary | ICD-10-CM | POA: Diagnosis not present

## 2023-03-28 DIAGNOSIS — E785 Hyperlipidemia, unspecified: Secondary | ICD-10-CM | POA: Diagnosis present

## 2023-03-28 DIAGNOSIS — R7881 Bacteremia: Secondary | ICD-10-CM | POA: Insufficient documentation

## 2023-03-28 DIAGNOSIS — N390 Urinary tract infection, site not specified: Secondary | ICD-10-CM | POA: Diagnosis present

## 2023-03-28 DIAGNOSIS — I4891 Unspecified atrial fibrillation: Secondary | ICD-10-CM | POA: Diagnosis not present

## 2023-03-28 DIAGNOSIS — Z01810 Encounter for preprocedural cardiovascular examination: Secondary | ICD-10-CM | POA: Diagnosis not present

## 2023-03-28 DIAGNOSIS — Z6835 Body mass index (BMI) 35.0-35.9, adult: Secondary | ICD-10-CM

## 2023-03-28 DIAGNOSIS — A4181 Sepsis due to Enterococcus: Secondary | ICD-10-CM | POA: Diagnosis present

## 2023-03-28 DIAGNOSIS — E43 Unspecified severe protein-calorie malnutrition: Secondary | ICD-10-CM | POA: Diagnosis present

## 2023-03-28 DIAGNOSIS — R7303 Prediabetes: Secondary | ICD-10-CM | POA: Diagnosis not present

## 2023-03-28 DIAGNOSIS — I63532 Cerebral infarction due to unspecified occlusion or stenosis of left posterior cerebral artery: Secondary | ICD-10-CM | POA: Diagnosis not present

## 2023-03-28 DIAGNOSIS — I059 Rheumatic mitral valve disease, unspecified: Principal | ICD-10-CM | POA: Diagnosis present

## 2023-03-28 DIAGNOSIS — I25119 Atherosclerotic heart disease of native coronary artery with unspecified angina pectoris: Secondary | ICD-10-CM | POA: Diagnosis not present

## 2023-03-28 DIAGNOSIS — Z781 Physical restraint status: Secondary | ICD-10-CM

## 2023-03-28 DIAGNOSIS — I82622 Acute embolism and thrombosis of deep veins of left upper extremity: Secondary | ICD-10-CM | POA: Diagnosis not present

## 2023-03-28 DIAGNOSIS — I358 Other nonrheumatic aortic valve disorders: Secondary | ICD-10-CM | POA: Diagnosis not present

## 2023-03-28 DIAGNOSIS — B952 Enterococcus as the cause of diseases classified elsewhere: Secondary | ICD-10-CM | POA: Insufficient documentation

## 2023-03-28 DIAGNOSIS — E669 Obesity, unspecified: Secondary | ICD-10-CM | POA: Diagnosis present

## 2023-03-28 DIAGNOSIS — R6521 Severe sepsis with septic shock: Secondary | ICD-10-CM | POA: Diagnosis present

## 2023-03-28 DIAGNOSIS — J80 Acute respiratory distress syndrome: Secondary | ICD-10-CM | POA: Diagnosis not present

## 2023-03-28 DIAGNOSIS — I77810 Thoracic aortic ectasia: Secondary | ICD-10-CM | POA: Diagnosis present

## 2023-03-28 DIAGNOSIS — Z841 Family history of disorders of kidney and ureter: Secondary | ICD-10-CM

## 2023-03-28 DIAGNOSIS — N170 Acute kidney failure with tubular necrosis: Secondary | ICD-10-CM | POA: Diagnosis present

## 2023-03-28 DIAGNOSIS — J9601 Acute respiratory failure with hypoxia: Secondary | ICD-10-CM | POA: Diagnosis present

## 2023-03-28 DIAGNOSIS — I2510 Atherosclerotic heart disease of native coronary artery without angina pectoris: Secondary | ICD-10-CM | POA: Diagnosis present

## 2023-03-28 DIAGNOSIS — I080 Rheumatic disorders of both mitral and aortic valves: Secondary | ICD-10-CM | POA: Diagnosis present

## 2023-03-28 DIAGNOSIS — F32A Depression, unspecified: Secondary | ICD-10-CM | POA: Diagnosis present

## 2023-03-28 DIAGNOSIS — F419 Anxiety disorder, unspecified: Secondary | ICD-10-CM | POA: Diagnosis present

## 2023-03-28 DIAGNOSIS — I330 Acute and subacute infective endocarditis: Secondary | ICD-10-CM | POA: Diagnosis present

## 2023-03-28 DIAGNOSIS — I76 Septic arterial embolism: Secondary | ICD-10-CM | POA: Diagnosis present

## 2023-03-28 DIAGNOSIS — Z809 Family history of malignant neoplasm, unspecified: Secondary | ICD-10-CM

## 2023-03-28 DIAGNOSIS — I63431 Cerebral infarction due to embolism of right posterior cerebral artery: Secondary | ICD-10-CM | POA: Diagnosis not present

## 2023-03-28 DIAGNOSIS — Z794 Long term (current) use of insulin: Secondary | ICD-10-CM | POA: Diagnosis not present

## 2023-03-28 DIAGNOSIS — K5900 Constipation, unspecified: Secondary | ICD-10-CM | POA: Diagnosis not present

## 2023-03-28 DIAGNOSIS — I340 Nonrheumatic mitral (valve) insufficiency: Secondary | ICD-10-CM | POA: Diagnosis not present

## 2023-03-28 DIAGNOSIS — I110 Hypertensive heart disease with heart failure: Secondary | ICD-10-CM | POA: Diagnosis present

## 2023-03-28 DIAGNOSIS — R600 Localized edema: Secondary | ICD-10-CM | POA: Diagnosis not present

## 2023-03-28 DIAGNOSIS — E876 Hypokalemia: Secondary | ICD-10-CM | POA: Diagnosis not present

## 2023-03-28 DIAGNOSIS — I509 Heart failure, unspecified: Secondary | ICD-10-CM | POA: Diagnosis present

## 2023-03-28 DIAGNOSIS — D62 Acute posthemorrhagic anemia: Secondary | ICD-10-CM | POA: Diagnosis not present

## 2023-03-28 DIAGNOSIS — Z952 Presence of prosthetic heart valve: Secondary | ICD-10-CM

## 2023-03-28 DIAGNOSIS — I69318 Other symptoms and signs involving cognitive functions following cerebral infarction: Secondary | ICD-10-CM | POA: Diagnosis not present

## 2023-03-28 DIAGNOSIS — I339 Acute and subacute endocarditis, unspecified: Secondary | ICD-10-CM | POA: Diagnosis not present

## 2023-03-28 DIAGNOSIS — Z8249 Family history of ischemic heart disease and other diseases of the circulatory system: Secondary | ICD-10-CM

## 2023-03-28 DIAGNOSIS — I10 Essential (primary) hypertension: Secondary | ICD-10-CM | POA: Diagnosis not present

## 2023-03-28 DIAGNOSIS — I82612 Acute embolism and thrombosis of superficial veins of left upper extremity: Secondary | ICD-10-CM | POA: Diagnosis not present

## 2023-03-28 DIAGNOSIS — H538 Other visual disturbances: Secondary | ICD-10-CM | POA: Diagnosis not present

## 2023-03-28 DIAGNOSIS — I69398 Other sequelae of cerebral infarction: Secondary | ICD-10-CM | POA: Diagnosis not present

## 2023-03-28 DIAGNOSIS — I214 Non-ST elevation (NSTEMI) myocardial infarction: Secondary | ICD-10-CM | POA: Diagnosis not present

## 2023-03-28 DIAGNOSIS — R0602 Shortness of breath: Secondary | ICD-10-CM | POA: Diagnosis not present

## 2023-03-28 DIAGNOSIS — A419 Sepsis, unspecified organism: Secondary | ICD-10-CM

## 2023-03-28 DIAGNOSIS — I9789 Other postprocedural complications and disorders of the circulatory system, not elsewhere classified: Secondary | ICD-10-CM | POA: Diagnosis not present

## 2023-03-28 DIAGNOSIS — R1310 Dysphagia, unspecified: Secondary | ICD-10-CM | POA: Diagnosis present

## 2023-03-28 DIAGNOSIS — I6340 Cerebral infarction due to embolism of unspecified cerebral artery: Secondary | ICD-10-CM | POA: Diagnosis not present

## 2023-03-28 DIAGNOSIS — I351 Nonrheumatic aortic (valve) insufficiency: Secondary | ICD-10-CM | POA: Diagnosis not present

## 2023-03-28 DIAGNOSIS — I3139 Other pericardial effusion (noninflammatory): Secondary | ICD-10-CM

## 2023-03-28 DIAGNOSIS — E119 Type 2 diabetes mellitus without complications: Secondary | ICD-10-CM | POA: Diagnosis not present

## 2023-03-28 DIAGNOSIS — Z79899 Other long term (current) drug therapy: Secondary | ICD-10-CM

## 2023-03-28 DIAGNOSIS — I609 Nontraumatic subarachnoid hemorrhage, unspecified: Secondary | ICD-10-CM | POA: Diagnosis not present

## 2023-03-28 DIAGNOSIS — E871 Hypo-osmolality and hyponatremia: Secondary | ICD-10-CM | POA: Diagnosis not present

## 2023-03-28 DIAGNOSIS — Z87440 Personal history of urinary (tract) infections: Secondary | ICD-10-CM

## 2023-03-28 DIAGNOSIS — E8779 Other fluid overload: Secondary | ICD-10-CM | POA: Diagnosis not present

## 2023-03-28 DIAGNOSIS — R5381 Other malaise: Secondary | ICD-10-CM | POA: Diagnosis not present

## 2023-03-28 DIAGNOSIS — Z7982 Long term (current) use of aspirin: Secondary | ICD-10-CM

## 2023-03-28 DIAGNOSIS — G9341 Metabolic encephalopathy: Secondary | ICD-10-CM | POA: Diagnosis present

## 2023-03-28 DIAGNOSIS — R570 Cardiogenic shock: Secondary | ICD-10-CM | POA: Diagnosis not present

## 2023-03-28 DIAGNOSIS — I38 Endocarditis, valve unspecified: Principal | ICD-10-CM | POA: Diagnosis present

## 2023-03-28 DIAGNOSIS — Z96642 Presence of left artificial hip joint: Secondary | ICD-10-CM | POA: Diagnosis present

## 2023-03-28 DIAGNOSIS — J9600 Acute respiratory failure, unspecified whether with hypoxia or hypercapnia: Secondary | ICD-10-CM

## 2023-03-28 DIAGNOSIS — I639 Cerebral infarction, unspecified: Secondary | ICD-10-CM | POA: Diagnosis not present

## 2023-03-28 DIAGNOSIS — R739 Hyperglycemia, unspecified: Secondary | ICD-10-CM | POA: Diagnosis present

## 2023-03-28 DIAGNOSIS — Z6834 Body mass index (BMI) 34.0-34.9, adult: Secondary | ICD-10-CM

## 2023-03-28 DIAGNOSIS — M7989 Other specified soft tissue disorders: Secondary | ICD-10-CM | POA: Diagnosis not present

## 2023-03-28 DIAGNOSIS — J9690 Respiratory failure, unspecified, unspecified whether with hypoxia or hypercapnia: Secondary | ICD-10-CM | POA: Diagnosis not present

## 2023-03-28 HISTORY — DX: Rheumatic mitral valve disease, unspecified: I05.9

## 2023-03-28 LAB — POCT I-STAT 7, (LYTES, BLD GAS, ICA,H+H)
Acid-base deficit: 3 mmol/L — ABNORMAL HIGH (ref 0.0–2.0)
Bicarbonate: 22.2 mmol/L (ref 20.0–28.0)
Calcium, Ion: 1.2 mmol/L (ref 1.15–1.40)
HCT: 26 % — ABNORMAL LOW (ref 39.0–52.0)
Hemoglobin: 8.8 g/dL — ABNORMAL LOW (ref 13.0–17.0)
O2 Saturation: 96 %
Patient temperature: 97.5
Potassium: 4.4 mmol/L (ref 3.5–5.1)
Sodium: 135 mmol/L (ref 135–145)
TCO2: 23 mmol/L (ref 22–32)
pCO2 arterial: 39.3 mmHg (ref 32–48)
pH, Arterial: 7.357 (ref 7.35–7.45)
pO2, Arterial: 83 mmHg (ref 83–108)

## 2023-03-28 LAB — GLUCOSE, CAPILLARY
Glucose-Capillary: 157 mg/dL — ABNORMAL HIGH (ref 70–99)
Glucose-Capillary: 167 mg/dL — ABNORMAL HIGH (ref 70–99)
Glucose-Capillary: 180 mg/dL — ABNORMAL HIGH (ref 70–99)

## 2023-03-28 LAB — ECHO TEE: P 1/2 time: 198 ms

## 2023-03-28 LAB — MRSA NEXT GEN BY PCR, NASAL: MRSA by PCR Next Gen: NOT DETECTED

## 2023-03-28 MED ORDER — MIDAZOLAM BOLUS VIA INFUSION
4.0000 mg | Freq: Once | INTRAVENOUS | Status: AC
  Administered 2023-03-28: 4 mg via INTRAVENOUS

## 2023-03-28 MED ORDER — SODIUM CHLORIDE 0.9 % IV SOLN
3.0000 g | Freq: Four times a day (QID) | INTRAVENOUS | Status: DC
  Administered 2023-03-28 – 2023-03-29 (×3): 3 g via INTRAVENOUS
  Filled 2023-03-28 (×3): qty 8

## 2023-03-28 MED ORDER — INSULIN ASPART 100 UNIT/ML IJ SOLN
0.0000 [IU] | INTRAMUSCULAR | Status: DC
  Administered 2023-03-29: 2 [IU] via SUBCUTANEOUS
  Administered 2023-03-29: 1 [IU] via SUBCUTANEOUS
  Administered 2023-03-29 – 2023-03-31 (×13): 2 [IU] via SUBCUTANEOUS

## 2023-03-28 MED ORDER — ORAL CARE MOUTH RINSE
15.0000 mL | OROMUCOSAL | Status: DC
  Administered 2023-03-29 – 2023-04-01 (×42): 15 mL via OROMUCOSAL

## 2023-03-28 MED ORDER — NOREPINEPHRINE 4 MG/250ML-% IV SOLN
0.0000 ug/min | INTRAVENOUS | Status: DC
  Administered 2023-03-28: 2 ug/min via INTRAVENOUS
  Administered 2023-03-29 – 2023-03-30 (×4): 4 ug/min via INTRAVENOUS
  Administered 2023-03-31: 18 ug/min via INTRAVENOUS
  Administered 2023-03-31: 25 ug/min via INTRAVENOUS
  Administered 2023-03-31: 4 ug/min via INTRAVENOUS
  Administered 2023-04-01 (×3): 25 ug/min via INTRAVENOUS
  Administered 2023-04-01: 18 ug/min via INTRAVENOUS
  Filled 2023-03-28 (×12): qty 250

## 2023-03-28 MED ORDER — FENTANYL CITRATE PF 50 MCG/ML IJ SOSY: 50.0000 ug | PREFILLED_SYRINGE | Freq: Once | INTRAMUSCULAR | Status: DC

## 2023-03-28 MED ORDER — POLYETHYLENE GLYCOL 3350 17 G PO PACK: 17.0000 g | PACK | Freq: Every day | ORAL | Status: DC | PRN

## 2023-03-28 MED ORDER — MIDAZOLAM-SODIUM CHLORIDE 100-0.9 MG/100ML-% IV SOLN
0.5000 mg/h | INTRAVENOUS | Status: DC
  Administered 2023-03-28: 5 mg/h via INTRAVENOUS
  Administered 2023-03-29: 2 mg/h via INTRAVENOUS
  Filled 2023-03-28: qty 100

## 2023-03-28 MED ORDER — DOCUSATE SODIUM 50 MG/5ML PO LIQD
100.0000 mg | Freq: Two times a day (BID) | ORAL | Status: DC
  Administered 2023-03-29 – 2023-04-01 (×6): 100 mg
  Filled 2023-03-28 (×8): qty 10

## 2023-03-28 MED ORDER — POLYETHYLENE GLYCOL 3350 17 G PO PACK
17.0000 g | PACK | Freq: Every day | ORAL | Status: DC
  Administered 2023-03-30 – 2023-04-01 (×2): 17 g
  Filled 2023-03-28 (×3): qty 1

## 2023-03-28 MED ORDER — LACTATED RINGERS IV SOLN: INTRAVENOUS | Status: DC

## 2023-03-28 MED ORDER — DOCUSATE SODIUM 100 MG PO CAPS: 100.0000 mg | ORAL_CAPSULE | Freq: Two times a day (BID) | ORAL | Status: DC | PRN

## 2023-03-28 MED ORDER — VANCOMYCIN HCL 1500 MG/300ML IV SOLN
1500.0000 mg | INTRAVENOUS | Status: DC
  Administered 2023-03-28: 1500 mg via INTRAVENOUS
  Filled 2023-03-28: qty 300

## 2023-03-28 MED ORDER — CHLORHEXIDINE GLUCONATE CLOTH 2 % EX PADS
6.0000 | MEDICATED_PAD | Freq: Every day | CUTANEOUS | Status: DC
  Administered 2023-03-28 – 2023-04-09 (×14): 6 via TOPICAL

## 2023-03-28 MED ORDER — ORAL CARE MOUTH RINSE: 15.0000 mL | OROMUCOSAL | Status: DC | PRN

## 2023-03-28 MED ORDER — FAMOTIDINE 20 MG PO TABS
20.0000 mg | ORAL_TABLET | Freq: Two times a day (BID) | ORAL | Status: DC
  Administered 2023-03-29 – 2023-04-01 (×7): 20 mg
  Filled 2023-03-28 (×8): qty 1

## 2023-03-28 MED ORDER — FENTANYL 2500MCG IN NS 250ML (10MCG/ML) PREMIX INFUSION
50.0000 ug/h | INTRAVENOUS | Status: DC
  Administered 2023-03-28: 200 ug/h via INTRAVENOUS
  Administered 2023-03-29: 125 ug/h via INTRAVENOUS
  Administered 2023-03-30 (×2): 150 ug/h via INTRAVENOUS
  Administered 2023-03-31 – 2023-04-01 (×3): 200 ug/h via INTRAVENOUS
  Administered 2023-04-01: 25 ug/h via INTRAVENOUS
  Administered 2023-04-01: 200 ug/h via INTRAVENOUS
  Filled 2023-03-28 (×7): qty 250

## 2023-03-28 MED ORDER — FENTANYL BOLUS VIA INFUSION
50.0000 ug | INTRAVENOUS | Status: DC | PRN
  Administered 2023-03-28: 100 ug via INTRAVENOUS
  Administered 2023-03-29 (×4): 50 ug via INTRAVENOUS
  Administered 2023-03-29: 100 ug via INTRAVENOUS
  Administered 2023-03-29: 50 ug via INTRAVENOUS
  Administered 2023-03-29: 75 ug via INTRAVENOUS
  Administered 2023-03-29 – 2023-03-31 (×11): 100 ug via INTRAVENOUS

## 2023-03-28 MED ORDER — HEPARIN SODIUM (PORCINE) 5000 UNIT/ML IJ SOLN
5000.0000 [IU] | Freq: Three times a day (TID) | INTRAMUSCULAR | Status: DC
  Administered 2023-03-28 – 2023-03-30 (×7): 5000 [IU] via SUBCUTANEOUS
  Filled 2023-03-28 (×8): qty 1

## 2023-03-28 NOTE — Consult Note (Signed)
Cardiology Consultation   Patient ID: Dale Mills MRN: [id number]; DOB: Apr 17, 1966  Admit date: 03/28/2023 Date of Consult: 03/28/2023  PCP:  Nonnie Done., MD    [facility] Providers Cardiologist:  Gypsy Balsam, MD        Patient Profile:   Dale Mills is a 57 y.o. male with a hx of nonobstructive CAD, hypertension, hyperlipidemia who is being seen 03/28/2023 for the evaluation of infective endocarditis at the request of Dr. Loletha Grayer.  History of Present Illness:   Dale Mills is a 57 year old male on whom I was consulted today by [facility] for newly recognized mitral valve endocarditis in the setting of sepsis of unknown primary source.  Patient has undergone hip replacement April 2024.  Also has a history of recurrent urinary tract infections and pyelonephritis.  Patient presented with symptoms of sepsis including nausea, weakness, fever, chills, noted to be hypotensive, with elevated white blood cell count, AKI with creatinine of 2.6 baseline is 0.6, tachycardic given fluids and unfortunately had respiratory decompensation requiring BiPAP and then intubation due to flash pulmonary edema.  Troponin mildly elevated at 100.  Antibiotics pursued, blood cultures positive for E faecalis.  Transferred to [facility] due to TTE suggesting mitral valve vegetation.  I urgently evaluated the patient upon his presentation to [facility], due to very loud systolic and diastolic murmurs, hypotension, and report of flash pulmonary edema, I requested stat echo and was present at the bedside and performed images personally as well.  Biventricular function noted to be normal, aortic root is dilated at 5 cm, and a linear echodensity in the aorta could not be excluded with transthoracic bedside echo.  Severe aortic valve regurgitation was noted which was not evident from [facility] records, therefore in an abundance of caution to rule out aortic dissection, with the  assistance of PCCM I performed a stat TEE at the bedside.  Probe passed easily.  See separate procedure note and TEE documentation in EMR.  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  Biventricular function normal, right sided heart valves normal.  Aortic root 5.3 cm by TEE, TEE excludes dissection after careful review and multiple images obtained.   Past Medical History:  Diagnosis Date   Angina pectoris (HCC) Canadian classification 2 04/10/2020   Anxiety    Arthritis    Coronary artery disease coronary CT angio did not show any major issues, suspicion for distal disease 06/12/2020   Depression    DJD (degenerative joint disease)    Dyslipidemia 02/13/2020   Essential hypertension 02/13/2020   Precordial chest pain 02/13/2020    Past Surgical History:  Procedure Laterality Date   COLONOSCOPY W/ POLYPECTOMY     FOREARM SURGERY     MASS REMOVED   HIP ARTHROSCOPY Left    LASIK Bilateral    TONSILECTOMY, ADENOIDECTOMY, BILATERAL MYRINGOTOMY AND TUBES  2011   UMBILICAL HERNIA REPAIR     VASECTOMY       Home Medications:  Prior to Admission medications   Medication Sig Start Date End Date Taking? Authorizing Provider  aspirin EC 81 MG tablet Take 81 mg by mouth daily. Swallow whole.    [provider]  atenolol (TENORMIN) 25 MG tablet Take 12.5 mg by mouth daily. 02/07/20   [provider]  atorvastatin (LIPITOR) 80 MG tablet Take 1  tablet (80 mg total) by mouth daily. 05/02/22   Georgeanna Lea, MD  lisinopril (ZESTRIL) 20 MG tablet Take 20 mg by mouth daily. 11/30/19   [provider]  nitroGLYCERIN (NITROSTAT) 0.4 MG SL tablet Place 1 tablet (0.4 mg total) under the tongue every 5 (five) minutes as needed for chest pain. 02/13/20 10/03/22  Georgeanna Lea, MD  ranolazine (RANEXA) 1000 MG SR tablet Take 1 tablet by mouth once daily 04/15/22   Georgeanna Lea, MD  tadalafil (CIALIS) 20 MG tablet Take 1 tablet by mouth every 3 (three) days.  12/23/19   [provider]    Inpatient Medications: Scheduled Meds:  Chlorhexidine Gluconate Cloth  6 each Topical Daily   Continuous Infusions:  PRN Meds:   Allergies:   No Known Allergies  Social History:   Social History   Socioeconomic History   Marital status: Divorced    Spouse name: Not on file   Number of children: 2   Years of education: 12 + SOME COLLEGE   Highest education level: Not on file  Occupational History   Occupation: [employer]  Tobacco Use   Smoking status: Never   Smokeless tobacco: Never  Vaping Use   Vaping status: Never Used  Substance and Sexual Activity   Alcohol use: Never   Drug use: Never   Sexual activity: Not Currently  Other Topics Concern   Not on file  Social History Narrative   Not on file   Social Determinants of Health   Financial Resource Strain: Not on file  Food Insecurity: Food Insecurity Present (03/17/2023)   Hunger Vital Sign    Worried About Running Out of Food in the Last Year: Sometimes true    Ran Out of Food in the Last Year: Sometimes true  Transportation Needs: No Transportation Needs (03/17/2023)   PRAPARE - Administrator, Civil Service (Medical): No    Lack of Transportation (Non-Medical): No  Physical Activity: Not on file  Stress: Not on file  Social Connections: Not on file  Intimate Partner Violence: Not At Risk (03/17/2023)   Humiliation, Afraid, Rape, and Kick questionnaire    Fear of Current or Ex-Partner: No    Emotionally Abused: No    Physically Abused: No    Sexually Abused: No    Family History:    Family History  Problem Relation Age of Onset   CAD Mother    Kidney failure Mother    Heart attack Father    Heart attack Paternal Grandfather    Cancer Child        RARE BONE CANCER  RESULTING IN TOTAL AMPUTATION OF LIMB     ROS:  Please see the history of present illness.   All other ROS reviewed and negative.     Physical Exam/Data:   Vitals:   03/28/23 1539  Pulse: (!) 101  SpO2: 94%   No intake or output data in the 24 hours ending 03/28/23 1636    03/17/2023    9:15 AM 03/04/2022    4:29 PM 11/13/2020   12:38 PM  Last 3 Weights  Weight (lbs) 249 lb 3.2 oz 273 lb 6.4 oz 269 lb  Weight (kg) 113.036 kg 124.013 kg 122.018 kg     There is no height or weight on file to calculate BMI.  General: Intubated and sedated HEENT: normal Neck: no JVD Vascular: No carotid bruits; Distal pulses 2+ bilaterally Cardiac: Loud systolic and diastolic murmurs heard throughout the  precordium, vigorous cardiac motion can be appreciated on the chest wall, prominent PMI Lungs: Ventilator breath sounds Abd: soft, nontender, no hepatomegaly  Ext: Trace edema Musculoskeletal:  No deformities Skin: warm and dry  Neuro: Intubated and sedated could not assess Psych: Intubated and sedated could not assess  EKG:  The EKG was personally reviewed and demonstrates: Pending Telemetry:  Telemetry was personally reviewed and demonstrates: Tachycardia  Relevant CV Studies: TEE-mitral and aortic valve endocarditis, dilated aortic root, torrential MR, severe AI.  Laboratory Data:  High Sensitivity Troponin:  No results for input(s): "TROPONINIHS" in the last 720 hours.   Chemistry Recent Labs  Lab 03/28/23 1629  NA 135  K 4.4    No results for input(s): "PROT", "ALBUMIN", "AST", "ALT", "ALKPHOS", "BILITOT" in the last 168 hours. Lipids No results for input(s): "CHOL", "TRIG", "HDL", "LABVLDL", "LDLCALC", "CHOLHDL" in the last 168 hours.  Hematology Recent Labs  Lab 03/28/23 1629  HGB 8.8*  HCT 26.0*   Thyroid No results for input(s): "TSH", "FREET4" in the last 168 hours.  BNPNo results for input(s): "BNP", "PROBNP" in the last 168 hours.  DDimer No results for input(s):  "DDIMER" in the last 168 hours.   Radiology/Studies:  No results found.   Assessment and Plan:   Principal Problem:   Endocarditis  -This is a very complex situation and we have shared that with his 2 daughters who presented after TEE performed.  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  Biventricular function is normal.  Patient certainly needs a surgical consult but will likely require blood cultures and medical stabilization first.  Given the degree of MR and destruction of the valve, it may be very challenging to stabilize him. -Broad-spectrum antibiotics and vasopressors per PCCM -Caution with fluids given wide-open MR. -ID consultation -Troponin mildly elevated but certainly seems consistent with demand ischemia, patient did not endorse chest pain on presentation to the ER at [facility].  Will observe, no wall motion abnormalities on echo.  Risk Assessment/Risk Scores:        New York Heart Association (NYHA) Functional Class N/a        For questions or updates, please contact [facility] Please consult [email or web address] for contact info under    Signed, Parke Poisson, MD  03/28/2023 4:36 PM  CRITICAL CARE Performed by: Weston Brass, MD   Total critical care time: 35 minutes   Critical care time was exclusive of separately billable procedures and treating other patients.   Critical care was necessary to treat or prevent imminent or life-threatening deterioration.   Critical care was time spent personally by me (independent of APPs or residents) on the following activities: development of  treatment plan with patient and/or surrogate as well as nursing, discussions with consultants, evaluation of patient's response to treatment, examination of patient, obtaining history from patient or surrogate, ordering and performing treatments and interventions, ordering and review of laboratory studies, ordering and review of radiographic studies, pulse oximetry and re-evaluation of patient's condition.

## 2023-03-28 NOTE — Progress Notes (Signed)
Pharmacy Antibiotic Note  Dale Mills is a 57 y.o. male transferred from [place] to [place] on 03/28/2023 with E.faecalis bacteremia and IE.  Pharmacy has been consulted for vancomycin and Unasyn dosing.  SCr 1.38, afebrile.  Data from [place]: Vanc 1500mg  Q36H on 9/16 at 2044  Zosyn 9/16 > 9/17 Doxy 9/16 >> 9/17 Amp x1 at 1042 on 9/17   9/16 BCx - E.faecalis, no sensitivity yet (previous cx S to amp) 9/16 UCx - suggestive of Enterococcus   Plan: Vanc 1500mg  IV Q24H, first dose tonight, for AUC 469 using SCr 1.38 Unasyn 3gm IV Q6H Monitor renal fxn, clinical progress, vanc levels as indicated F/u culture sensitivity to narrow     Temp (24hrs), Avg:97.8 F (36.6 C), Min:97.8 F (36.6 C), Max:97.8 F (36.6 C)  No results for input(s): "WBC", "CREATININE", "LATICACIDVEN", "VANCOTROUGH", "VANCOPEAK", "VANCORANDOM", "GENTTROUGH", "GENTPEAK", "GENTRANDOM", "TOBRATROUGH", "TOBRAPEAK", "TOBRARND", "AMIKACINPEAK", "AMIKACINTROU", "AMIKACIN" in the last 168 hours.  Estimated Creatinine Clearance: 75.5 mL/min (A) (by C-G formula based on SCr of 1.38 mg/dL (H)).    No Known Allergies   Betsaida [place] D. Laney Potash, PharmD, BCPS, BCCCP 03/28/2023, 6:51 PM

## 2023-03-28 NOTE — Progress Notes (Signed)
  Echocardiogram Echocardiogram Transesophageal has been performed.  Delcie Roch 03/28/2023, 5:55 PM

## 2023-03-28 NOTE — Plan of Care (Signed)

## 2023-03-28 NOTE — H&P (Signed)
NAME:  Dale Mills, MRN:  [id number], DOB:  Jul 23, 1965, LOS: 0 ADMISSION DATE:  03/28/2023, CONSULTATION DATE:  9/17 REFERRING MD:  August Saucer, CHIEF COMPLAINT:  endocarditis    History of Present Illness:  57 year old male patient with history as mentioned below which includes hypertension, coronary artery disease, recurrent urinary tract infections and pyelonephritis, and recent left hip arthroplasty with surgery completed back in April 2024.  Presents to the emergency room with chief complaint of nausea, lightheadedness, generalized weakness, fever and chills.  Presented from work on 9/16 with the above symptoms, initially found to be slightly hypotensive with systolic blood pressure 95 heart rate 92 white blood cell count of 20.4 hemoglobin 10.9 BUN was 39 and serum creatinine 2.6, baseline 0.6.  His BNP was 2880, UA was concerning for possible UTI chest x-ray was clear CT and abdomen was negative for acute findings a right upper quadrant ultrasound was obtained which showed cholelithiasis but no evidence of cholecystitis his heart rate continue to elevate while in the emergency room noted to be in sinus tachycardia heart rate 110s to 120s blood pressure remained in the soft 90s systolic cultures were sent he was administered IV crystalloid and 1 during resuscitation effort required increasing supplemental oxygen with progressive increased work of breathing and follow-up chest x-ray suggesting flash pulmonary edema.  He was unable to tolerate noninvasive ventilation and therefore was intubated.  His Tmax while in the emergency room was 105.8 his blood pressure at its lowest dropped to the 80s systolic.  He was started on dobutamine infusion and broad-spectrum antibiotics have been started as well working diagnosis at time of initial evaluation was sepsis/septic shock felt secondary to urinary tract source complicated by AKI, non-STEMI, and flash pulmonary edema.  He was admitted to the intensive care,  antibiotics were continued as were resuscitation efforts and biotics.  Blood cultures were positive for GPC in pairs and chains with PCR detecting e faecalis.  Transthoracic echocardiogram suggesting endocarditis because of these findings he was transferred to [facility] for definitive care Pertinent  Medical History  Recent Left hip replacement, pyelonephritis, hypertension, depression, anxiety, coronary artery disease, prior cardiac catheterization, prior hernia repair.  Last echocardiogram in July 2024 showed EF 55 to 60%, recurrent UTIs.  Discussed this Significant Hospital Events: Including procedures, antibiotic start and stop dates in addition to other pertinent events   9/16 admitted with septic shock initially felt urinary tract source, abx started 9/16 through 9/17 cultures positive for GPC in pairs and clusters, PCR suggesting E facialis, echocardiogram suggesting mitral valve endocarditis, transferred to [facility] TEE obtained at bedside and ccm admitting  Interim History / Subjective:  Sedated on vent   Objective   Pulse (!) 101, temperature 97.8 F (36.6 C), temperature source Oral, SpO2 94%.    Vent Mode: PRVC FiO2 (%):  [50 %] 50 % Set Rate:  [22 bmp] 22 bmp Vt Set:  [500 mL-600 mL] 600 mL PEEP:  [5 cmH20] 5 cmH20 Plateau Pressure:  [16 cmH20] 16 cmH20  No intake or output data in the 24 hours ending 03/28/23 1807 There were no vitals filed for this visit.  Examination: General: sedated on vent  HENT: orally intubated no cl jvd Lungs: decreased bases currently on 50% FIO2 and 5 peep. Pplat 16. PCXR ett good position, right internal jugular CVL good position R>L airspace disease Cardiovascular: RRR + holosystolic HM Abdomen: soft  Extremities: warm no sig edema  Neuro: sedated on vent  GU: cl yellow  Resolved Hospital Problem list     Assessment & Plan:  Sepsis secondary to GPC bacteremia and MV endocarditis  PCR + for E Facialis  -tee completed. Informally showing  sig MV vegetation and AS Plan Cont IVFs IV vanc and unasyn  Tele  Hold antihypertensives Repeat BC ID consult pending Will need CVTS consult,  Acute hypoxic resp failure 2/2 pulmonary edema and possible element of ALI Plan Full vent support w/ low VT ventilation  Titrate PEEP/FIO2 for sats > 92% F/u am cxr Keep euvolemic for now, will need diuresis eventually but degree of AS may add a challenge VAP bundle PAD protocol RASS goal -1  Acute renal failure secondary to septic shock Plan Cont IVFs IV dose meds Strict I&O Am chem   Acute metabolic encephalopathy 2/2 sepsis  Plan Supportive care PAD protocol   Chronic anemia  -no current evidence of bleeding Plan Trend cbc  Trigger for transfusion < 7 hgb  H/o CAD w/ elevated trop I -trop I elevated likely 2/2 acute endocarditis  Plan Tele  Treat endocarditis   H/o HTN Plan Holding home antihypertensive  H/o hyperlipidemia  Plan Holding home statin for now   Boarderline hyperglycemia Plan Monitor q 4 Ssi if > 140.     Best Practice (right click and "Reselect all SmartList Selections" daily)   Diet/type: NPO DVT prophylaxis: prophylactic heparin  GI prophylaxis: H2B Lines: Central line Foley:  Yes, and it is still needed Code Status:  full code Last date of multidisciplinary goals of care discussion [pending]  Labs   CBC: Recent Labs  Lab 03/28/23 1629  HGB 8.8*  HCT 26.0*    Basic Metabolic Panel: Recent Labs  Lab 03/28/23 1629  NA 135  K 4.4   GFR: Estimated Creatinine Clearance: 75.5 mL/min (A) (by C-G formula based on SCr of 1.38 mg/dL (H)). No results for input(s): "PROCALCITON", "WBC", "LATICACIDVEN" in the last 168 hours.  Liver Function Tests: No results for input(s): "AST", "ALT", "ALKPHOS", "BILITOT", "PROT", "ALBUMIN" in the last 168 hours. No results for input(s): "LIPASE", "AMYLASE" in the last 168 hours. No results for input(s): "AMMONIA" in the last 168 hours.  ABG     Component Value Date/Time   PHART 7.357 03/28/2023 1629   PCO2ART 39.3 03/28/2023 1629   PO2ART 83 03/28/2023 1629   HCO3 22.2 03/28/2023 1629   TCO2 23 03/28/2023 1629   ACIDBASEDEF 3.0 (H) 03/28/2023 1629   O2SAT 96 03/28/2023 1629     Coagulation Profile: No results for input(s): "INR", "PROTIME" in the last 168 hours.  Cardiac Enzymes: No results for input(s): "CKTOTAL", "CKMB", "CKMBINDEX", "TROPONINI" in the last 168 hours.  HbA1C: No results found for: "HGBA1C"  CBG: Recent Labs  Lab 03/28/23 1601  GLUCAP 180*    Review of Systems:   Not able   Past Medical History:  He,  has a past medical history of Angina pectoris (HCC) [place] classification 2 (04/10/2020), Anxiety, Arthritis, Coronary artery disease coronary CT angio did not show any major issues, suspicion for distal disease (06/12/2020), Depression, DJD (degenerative joint disease), Dyslipidemia (02/13/2020), Essential hypertension (02/13/2020), and Precordial chest pain (02/13/2020).   Surgical History:   Past Surgical History:  Procedure Laterality Date   COLONOSCOPY W/ POLYPECTOMY     FOREARM SURGERY     MASS REMOVED   HIP ARTHROSCOPY Left    LASIK Bilateral    TONSILECTOMY, ADENOIDECTOMY, BILATERAL MYRINGOTOMY AND TUBES  2011   UMBILICAL HERNIA REPAIR     VASECTOMY  Social History:   reports that he has never smoked. He has never used smokeless tobacco. He reports that he does not drink alcohol and does not use drugs.   Family History:  His family history includes CAD in his mother; Cancer in his child; Heart attack in his father and paternal grandfather; Kidney failure in his mother.   Allergies No Known Allergies   Home Medications  Prior to Admission medications   Medication Sig Start Date End Date Taking? Authorizing Provider  aspirin EC 81 MG tablet Take 81 mg by mouth daily. Swallow whole.    [provider]  atenolol (TENORMIN) 25 MG tablet Take 12.5 mg by mouth  daily. 02/07/20   [provider]  atorvastatin (LIPITOR) 80 MG tablet Take 1 tablet (80 mg total) by mouth daily. 05/02/22   Georgeanna Lea, MD  lisinopril (ZESTRIL) 20 MG tablet Take 20 mg by mouth daily. 11/30/19   [provider]  nitroGLYCERIN (NITROSTAT) 0.4 MG SL tablet Place 1 tablet (0.4 mg total) under the tongue every 5 (five) minutes as needed for chest pain. 02/13/20 10/03/22  Georgeanna Lea, MD  ranolazine (RANEXA) 1000 MG SR tablet Take 1 tablet by mouth once daily 04/15/22   Georgeanna Lea, MD  tadalafil (CIALIS) 20 MG tablet Take 1 tablet by mouth every 3 (three) days.  12/23/19   [provider]     Critical care time: 32 min     Simonne Martinet ACNP-BC [facility] Pulmonary/Critical Care Pager # [phone] OR # [phone] if no answer

## 2023-03-28 NOTE — Progress Notes (Signed)
eLink Physician-Brief Progress Note Patient Name: Dale Mills DOB: 30-May-1966 MRN: [id number]   Date of Service  03/28/2023  HPI/Events of Note  May you confirm OGT on KUB   Also pt CBG 150-160's. Asking if you would like to add a sliding scale. Not a known diabetic  eICU Interventions  KUB ordered, reviewed and Okd use of OGT Ordered q 4 CBG and low dose SSI     Intervention Category Intermediate Interventions: Diagnostic test evaluation;Hyperglycemia - evaluation and treatment  Darl Pikes 03/28/2023, 11:49 PM

## 2023-03-28 NOTE — CV Procedure (Signed)
INDICATIONS: Endocarditis, concern for aortic dissection  PROCEDURE:   Informed consent was not obtained prior to the procedure due to the emergent nature of the procedure.   After a procedural time-out, TEE probe inserted into the esophagus and stomach.   During this procedure the patient was administered fentanyl and versed per PCCM/Dr. Merrily Pew.  The patient's heart rate, blood pressure, and oxygen saturation were monitored continuously during the procedure. The period of conscious sedation was 40 minutes, of which I was present face-to-face 100% of this time.  The transesophageal probe was inserted in the esophagus and stomach without difficulty and multiple views were obtained.  Procedure performed bedside in ICU room 2H04.   Agitated microbubble saline contrast was not administered.  COMPLICATIONS:    There were no immediate complications.  FINDINGS:   FORMAL ECHOCARDIOGRAM REPORT PENDING 1. 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 and  anterior leaflet windsock aneurysm with torrential mitral valve  regurgitation. Vegetation is at least 2.5 x 1.2 cm. The mitral valve is  abnormal. Torrential mitral valve regurgitation.   2. 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 Severe aortic valve regurgitation with holosystolic reversals  in the descending thoracic aorta, reversal TVI   16 cm. Small aortic valve vegetation seen below AV along aortomitral  continuity but not contiguous with mitral valve vegetation. No evidence of  aortic root abscess . The aortic valve is abnormal. Aortic valve  regurgitation is severe. Aortic  regurgitation PHT measures 198 msec.   3. No evidence of aortic dissection. Aortic dilatation noted. 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 There is severe dilatation of the aortic  root.   4. Left ventricular ejection fraction, by  estimation, is 65 to 70%. The  left ventricle has normal function.   5. Right ventricular systolic function is normal. The right ventricular  size is normal.   6. Left atrial size was mildly dilated. No left atrial/left atrial  appendage thrombus was detected.   7. A small pericardial effusion is present.   RECOMMENDATIONS:     TCTS and ID consults.  Time Spent Directly with the Patient:  60 minutes   Parke Poisson 03/28/2023, 6:51 PM

## 2023-03-28 NOTE — Telephone Encounter (Signed)
03/28/23 Patient appts cancelled.He has been admitted to the hospital.

## 2023-03-29 ENCOUNTER — Inpatient Hospital Stay (HOSPITAL_COMMUNITY): Payer: Medicaid Other

## 2023-03-29 DIAGNOSIS — I339 Acute and subacute endocarditis, unspecified: Secondary | ICD-10-CM

## 2023-03-29 DIAGNOSIS — R6521 Severe sepsis with septic shock: Secondary | ICD-10-CM

## 2023-03-29 DIAGNOSIS — I351 Nonrheumatic aortic (valve) insufficiency: Secondary | ICD-10-CM

## 2023-03-29 DIAGNOSIS — I639 Cerebral infarction, unspecified: Secondary | ICD-10-CM

## 2023-03-29 DIAGNOSIS — B952 Enterococcus as the cause of diseases classified elsewhere: Secondary | ICD-10-CM | POA: Insufficient documentation

## 2023-03-29 DIAGNOSIS — Z01810 Encounter for preprocedural cardiovascular examination: Secondary | ICD-10-CM

## 2023-03-29 DIAGNOSIS — J9600 Acute respiratory failure, unspecified whether with hypoxia or hypercapnia: Secondary | ICD-10-CM

## 2023-03-29 DIAGNOSIS — R7881 Bacteremia: Secondary | ICD-10-CM

## 2023-03-29 DIAGNOSIS — A419 Sepsis, unspecified organism: Secondary | ICD-10-CM

## 2023-03-29 DIAGNOSIS — I330 Acute and subacute infective endocarditis: Secondary | ICD-10-CM

## 2023-03-29 DIAGNOSIS — I340 Nonrheumatic mitral (valve) insufficiency: Secondary | ICD-10-CM

## 2023-03-29 HISTORY — DX: Sepsis, unspecified organism: A41.9

## 2023-03-29 HISTORY — DX: Enterococcus as the cause of diseases classified elsewhere: B95.2

## 2023-03-29 HISTORY — DX: Acute respiratory failure, unspecified whether with hypoxia or hypercapnia: J96.00

## 2023-03-29 HISTORY — DX: Enterococcus as the cause of diseases classified elsewhere: R78.81

## 2023-03-29 LAB — GLUCOSE, CAPILLARY
Glucose-Capillary: 141 mg/dL — ABNORMAL HIGH (ref 70–99)
Glucose-Capillary: 153 mg/dL — ABNORMAL HIGH (ref 70–99)
Glucose-Capillary: 160 mg/dL — ABNORMAL HIGH (ref 70–99)
Glucose-Capillary: 164 mg/dL — ABNORMAL HIGH (ref 70–99)
Glucose-Capillary: 165 mg/dL — ABNORMAL HIGH (ref 70–99)
Glucose-Capillary: 166 mg/dL — ABNORMAL HIGH (ref 70–99)
Glucose-Capillary: 195 mg/dL — ABNORMAL HIGH (ref 70–99)

## 2023-03-29 MED ORDER — SODIUM CHLORIDE 0.9 % IV SOLN
2.0000 g | Freq: Four times a day (QID) | INTRAVENOUS | Status: DC
  Administered 2023-03-29 – 2023-03-30 (×4): 2 g via INTRAVENOUS
  Filled 2023-03-29 (×5): qty 2000

## 2023-03-29 MED ORDER — THIAMINE MONONITRATE 100 MG PO TABS
100.0000 mg | ORAL_TABLET | Freq: Every day | ORAL | Status: AC
  Administered 2023-03-29 – 2023-04-01 (×3): 100 mg
  Filled 2023-03-29 (×4): qty 1

## 2023-03-29 MED ORDER — SODIUM CHLORIDE 0.9 % IV SOLN
2.0000 g | Freq: Two times a day (BID) | INTRAVENOUS | Status: DC
  Administered 2023-03-29 – 2023-04-09 (×22): 2 g via INTRAVENOUS
  Filled 2023-03-29 (×23): qty 20

## 2023-03-29 MED ORDER — DEXMEDETOMIDINE HCL IN NACL 400 MCG/100ML IV SOLN
0.0000 ug/kg/h | INTRAVENOUS | Status: DC
  Administered 2023-03-29: 0.6 ug/kg/h via INTRAVENOUS
  Administered 2023-03-29: 0.5 ug/kg/h via INTRAVENOUS
  Administered 2023-03-29: 0.4 ug/kg/h via INTRAVENOUS
  Administered 2023-03-30 (×2): 1 ug/kg/h via INTRAVENOUS
  Administered 2023-03-30: 1.2 ug/kg/h via INTRAVENOUS
  Administered 2023-03-30: 0.8 ug/kg/h via INTRAVENOUS
  Administered 2023-03-30: 1 ug/kg/h via INTRAVENOUS
  Administered 2023-03-30: 1.2 ug/kg/h via INTRAVENOUS
  Administered 2023-03-30: 1 ug/kg/h via INTRAVENOUS
  Administered 2023-03-31 (×2): 1.2 ug/kg/h via INTRAVENOUS
  Filled 2023-03-29 (×12): qty 100

## 2023-03-29 MED ORDER — ATORVASTATIN CALCIUM 80 MG PO TABS
80.0000 mg | ORAL_TABLET | Freq: Every day | ORAL | Status: DC
  Administered 2023-03-29 – 2023-03-30 (×2): 80 mg
  Filled 2023-03-29 (×2): qty 1

## 2023-03-29 MED ORDER — ACETAMINOPHEN 325 MG PO TABS
650.0000 mg | ORAL_TABLET | ORAL | Status: DC | PRN
  Administered 2023-03-29 – 2023-03-30 (×3): 650 mg
  Filled 2023-03-29 (×4): qty 2

## 2023-03-29 MED ORDER — ASPIRIN 325 MG PO TABS
325.0000 mg | ORAL_TABLET | Freq: Every day | ORAL | Status: DC
  Administered 2023-03-29: 325 mg
  Filled 2023-03-29 (×2): qty 1

## 2023-03-29 MED ORDER — VITAL 1.5 CAL PO LIQD
1000.0000 mL | ORAL | Status: DC
  Administered 2023-03-29: 1000 mL

## 2023-03-29 MED ORDER — PROSOURCE TF20 ENFIT COMPATIBL EN LIQD
60.0000 mL | Freq: Four times a day (QID) | ENTERAL | Status: DC
  Administered 2023-03-29 – 2023-03-30 (×5): 60 mL
  Filled 2023-03-29 (×7): qty 60

## 2023-03-29 NOTE — Progress Notes (Signed)
Transported patient to and from CT scan on full support and 100% FIO2.  Tolerated well.

## 2023-03-29 NOTE — Progress Notes (Addendum)
eLink Physician-Brief Progress Note Patient Name: Dale Mills DOB: 03/03/1966 MRN: [id number]   Date of Service  03/29/2023  HPI/Events of Note  1.) ETT >5 cm above carina on CT and CXR from earlier today  2.) Temp 101.8. No cooling blankets available  eICU Interventions  1.)Advance ETT 2 cm and repeat CXR ordered  2.)Tylenol last given 1458. Q4 PRN tylenol available. Advised to give now   CXR reviewed 8:50 PM. ETT 5.7 cm above carina. Orders placed to advanced additional 1-2 cm. Discussed with bedside RN and RT who will advance now. F/u CXR with ETT between 5- 5.5 cm above carina  Intervention Category Minor Interventions: Clinical assessment - ordering diagnostic tests  [name] 03/29/2023, 8:11 PM

## 2023-03-29 NOTE — Progress Notes (Addendum)
Patient Name: Dale Mills Date of Encounter: 03/29/2023 [facility] Cardiologist: Gypsy Balsam, MD   Interval Summary  .    Patient remains critically ill in the ICU bed 2H 20  Vital Signs .    Vitals:   03/29/23 0800 03/29/23 0815 03/29/23 0900 03/29/23 0915  BP:  (!) 102/58  (!) 96/56  Pulse: 79 78 81 77  Resp: (!) 22 (!) 22 20 (!) 22  Temp: 98.3 F (36.8 C)     TempSrc: Oral     SpO2: 97% 97% 95% 96%  Weight:        Intake/Output Summary (Last 24 hours) at 03/29/2023 1018 Last data filed at 03/29/2023 0918 Gross per 24 hour  Intake 2108.8 ml  Output 1630 ml  Net 478.8 ml      03/29/2023    1:15 AM 03/17/2023    9:15 AM 03/04/2022    4:29 PM  Last 3 Weights  Weight (lbs) 243 lb 6.2 oz 249 lb 3.2 oz 273 lb 6.4 oz  Weight (kg) 110.4 kg 113.036 kg 124.013 kg      Telemetry/ECG    SR - Personally Reviewed  Physical Exam .   GEN: Intubated and sedated Neck: No JVD Cardiac: 4/6 holosystolic murmur across precordium, 2/6 diastolic murmur best at left sternal border Respiratory: Ventilator breath sounds GI: Soft, nontender, non-distended  MS: No edema, warm, 4/4 DP pulses  Assessment & Plan .     Principal Problem:   Endocarditis Active Problems:   Acute respiratory failure (HCC)   Septic shock (HCC)   -Patient has severe mitral valve endocarditis with large multilobed vegetation and large windsock deformity of the anterior mitral leaflet.  In addition he has aortic root dilation measuring approximately 53 mm with resultant poor coaptation of the aortic valve cusps, and probable infective change of the aortic valve leaflets, with a small vegetation seen on the ventricular aspect of that aortic valve along the aorto mitral continuity.  Very complex situation, concern for feasibility of repair given the degree of mitral valve endocarditis and likely subacute presentation with windsock deformity and torrential MR. -Patient continues on sedation which  has been transition to Precedex, blood pressure supported with norepinephrine. -TCTS has been consulted, however blood cultures are still pending, and feasibility of valve replacements is in question.  I had a thorough discussion with the patient's 2 daughters and have expressed my concerns for how gravely ill he is.  CRITICAL CARE Performed by: Weston Brass, MD   Total critical care time: 45 minutes   Critical care time was exclusive of separately billable procedures and treating other patients.   Critical care was necessary to treat or prevent imminent or life-threatening deterioration.   Critical care was time spent personally by me (independent of APPs or residents) on the following activities: development of treatment plan with patient and/or surrogate as well as nursing, discussions with consultants, evaluation of patient's response to treatment, examination of patient, obtaining history from patient or surrogate, ordering and performing treatments and interventions, ordering and review of laboratory studies, ordering and review of radiographic studies, pulse oximetry and re-evaluation of patient's condition.  For questions or updates, please contact [facility] Please consult [email or web address] for contact info under        Signed, Parke Poisson, MD     Agree with above Concern over the degree of both AI and MR due to infection that may not tolerate this over the next several days and  waiting may lead to further decompensation. Will get head and chest CT and will discuss with CCM plan for surgery this Friday. I feel that even at his age, tissue valve at this stage may be best with recent endocarditis. Will assess over next 24 hrs to see what would be best timing for surgery. Discussed this with his daughters.

## 2023-03-29 NOTE — Progress Notes (Signed)
Carotid duplex has been completed.    Results can be found under chart review under CV PROC. 03/29/2023 4:03 PM Milla Wahlberg RVT, RDMS

## 2023-03-29 NOTE — Consult Note (Signed)
Skin: rash, swelling[  ];, hair loss[  ];  peripheral edema[  ];  or itching[  ]; Musculosketetal: myalgias[  ];  joint swelling[  ];  joint erythema[  ];  joint pain[Y  ];  back pain[  ];  Heme/Lymph: bruising[  ];  bleeding[  ];  anemia[  ];  Neuro: TIA[  ];  headaches[  ];  stroke[  ];  vertigo[  ];  seizures[  ];   paresthesias[  ];  difficulty walking[  ];  Psych:depression[  ]; anxiety[  ];  Endocrine: diabetes[  ];  thyroid dysfunction[  ];  Physical Exam: BP (!) 96/56   Pulse 77   Temp 98.3 F (36.8 C) (Oral)   Resp (!) 22   Wt 110.4 kg   SpO2 96%   BMI 33.95 kg/m   General appearance: appears stated age Head: Normocephalic, without obvious abnormality, atraumatic Resp: ventilatory breath sounds Cardio: regular rate and rhythm and + loud blowing murmur GI: soft, non-tender; bowel sounds normal; no masses,  no organomegaly Extremities: edema trace Neurologic: sedated on ventilator  Diagnostic Studies & Laboratory data:     Recent Radiology Findings:   DG Abd 1 View  Result Date: 03/28/2023 CLINICAL DATA:  OG tube placement EXAM: ABDOMEN - 1 VIEW COMPARISON:  None Available. FINDINGS: Enteric tube terminates in the distal gastric antrum. IMPRESSION: Enteric tube terminates in the distal gastric  antrum. Electronically Signed   By: Charline Bills M.D.   On: 03/28/2023 23:46   ECHO TEE  Result Date: 03/28/2023    TRANSESOPHOGEAL ECHO REPORT   Patient Name:   Dale Mills Date of Exam: 03/28/2023 Medical Rec #:  [id number]      Height:       71.0 in Accession #:    [id number]     Weight:       249.2 lb Date of Birth:  01-19-1966      BSA:          2.315 m Patient Age:    57 years       BP:           94/54 mmHg Patient Gender: M              HR:           92 bpm. Exam Location:  Inpatient Procedure: 3D Echo, Transesophageal Echo, Color Doppler and Cardiac Doppler Indications:     Endocarditis.  History:         Patient has prior history of Echocardiogram examinations, most                  recent 01/28/2023. Risk Factors:Hypertension and Dyslipidemia.  Sonographer:     Delcie Roch RDCS Referring Phys:  [id number] Parke Poisson Diagnosing Phys: Weston Brass MD  Sonographer Comments: MD comments: TEE performed emergently to rule out aortic dissection with recognition of severe AI. PROCEDURE: After discussion of the risks and benefits of a TEE, an informed consent was obtained emergent. The patient was intubated. TEE procedure time was 40 minutes. The transesophogeal probe was passed without difficulty through the esophogus of the patient. Imaged were obtained with the patient in a supine position. Sedation performed by different physician. Image quality was excellent. The patient's vital signs; including heart rate, blood pressure, and oxygen saturation; remained stable throughout the procedure. Supplementary images were obtained from transthoracic windows as indicated to answer the clinical question. The patient developed no complications during the procedure.  IMPRESSIONS  [address]       [place] [address]             [phone]        SHENOUDA MOTOLA [facility] Medical Record #[id number] Date of Birth: 1966/03/16  Referring: Virgil Benedict, MD Primary Care: Nonnie Done., MD Primary Cardiologist:Robert Bing Matter, MD  Chief Complaint: Endocarditis  History of Present Illness:      Dale Mills is a 56 yo male with known history of Non-obstructive CAD, HLD, HTN, recurrent UTI/Pylonephritis infections, and recent hip replacement surgery (10/2022).  He presented to [facility] with complaints nausea, weakness, fever, and chills.  Workup in the ED showed the patient to be hypotensive, + leukocytosis, AKI with creatinine level of 2.6, and tachycardia.  He was started on fluids, which unfortunately let to flash pulmonary edema that required intubation.  He was started on broad spectrum ABX, blood cultures were obtained which showed Enterococcus Faecalis.  TTE was obtained and was concerning for a Mitral Valve Vegetation.  He was transferred to [facility] for further care.  Urgent Cardiology consult was obtained due to loud murmurs on exam.  Stat Echocardiogram was performed and showed normal biventricular function, there was severe AI prompting concern for Aortic Dissection and ICU TEE was performed at bedside.  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  Dissection was able to be excluded with TEE.  He remains critically ill, intubated, on broad spectrum antibiotics, and Levophed for hypotensive septic shock.  Cardiothoracic surgery consultation has been requested.  The patient is sedated on vent.  His daughters are at bedside.  They states he hasn't been himself since his hip surgery.  They states he just kept saying I was fine until I had this done.   He has been having episodic N/V 2-3 times per day.  They state his primary physician felt it was related to anemia and he was scheduled to have IV iron infusion this coming Friday.  He has his own teeth and was most recently seen at the dentist prior to his hip replacement.  Current Activity/ Functional Status: Patient was independent with mobility/ambulation, transfers, ADL's, IADL's.   Zubrod Score: At the time of surgery this patient's most appropriate activity status/level should be described as: []     0    Normal activity, no symptoms []     1    Restricted in physical strenuous activity but ambulatory, able to do out light work []     2    Ambulatory and capable of self care, unable to do work activities, up and about                 more than 50%  Of the time                            []     3    Only limited self care, in bed greater than 50% of waking hours []     4    Completely disabled, no self care, confined to bed or chair []     5    Moribund  Past Medical History:  Diagnosis Date   Angina pectoris (HCC) [place] classification 2 04/10/2020   Anxiety    Arthritis    Coronary artery disease coronary CT  [address]       [place] [address]             [phone]        SHENOUDA MOTOLA [facility] Medical Record #[id number] Date of Birth: 1966/03/16  Referring: Virgil Benedict, MD Primary Care: Nonnie Done., MD Primary Cardiologist:Robert Bing Matter, MD  Chief Complaint: Endocarditis  History of Present Illness:      Dale Mills is a 56 yo male with known history of Non-obstructive CAD, HLD, HTN, recurrent UTI/Pylonephritis infections, and recent hip replacement surgery (10/2022).  He presented to [facility] with complaints nausea, weakness, fever, and chills.  Workup in the ED showed the patient to be hypotensive, + leukocytosis, AKI with creatinine level of 2.6, and tachycardia.  He was started on fluids, which unfortunately let to flash pulmonary edema that required intubation.  He was started on broad spectrum ABX, blood cultures were obtained which showed Enterococcus Faecalis.  TTE was obtained and was concerning for a Mitral Valve Vegetation.  He was transferred to [facility] for further care.  Urgent Cardiology consult was obtained due to loud murmurs on exam.  Stat Echocardiogram was performed and showed normal biventricular function, there was severe AI prompting concern for Aortic Dissection and ICU TEE was performed at bedside.  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  Dissection was able to be excluded with TEE.  He remains critically ill, intubated, on broad spectrum antibiotics, and Levophed for hypotensive septic shock.  Cardiothoracic surgery consultation has been requested.  The patient is sedated on vent.  His daughters are at bedside.  They states he hasn't been himself since his hip surgery.  They states he just kept saying I was fine until I had this done.   He has been having episodic N/V 2-3 times per day.  They state his primary physician felt it was related to anemia and he was scheduled to have IV iron infusion this coming Friday.  He has his own teeth and was most recently seen at the dentist prior to his hip replacement.  Current Activity/ Functional Status: Patient was independent with mobility/ambulation, transfers, ADL's, IADL's.   Zubrod Score: At the time of surgery this patient's most appropriate activity status/level should be described as: []     0    Normal activity, no symptoms []     1    Restricted in physical strenuous activity but ambulatory, able to do out light work []     2    Ambulatory and capable of self care, unable to do work activities, up and about                 more than 50%  Of the time                            []     3    Only limited self care, in bed greater than 50% of waking hours []     4    Completely disabled, no self care, confined to bed or chair []     5    Moribund  Past Medical History:  Diagnosis Date   Angina pectoris (HCC) [place] classification 2 04/10/2020   Anxiety    Arthritis    Coronary artery disease coronary CT  [address]       [place] [address]             [phone]        SHENOUDA MOTOLA [facility] Medical Record #[id number] Date of Birth: 1966/03/16  Referring: Virgil Benedict, MD Primary Care: Nonnie Done., MD Primary Cardiologist:Robert Bing Matter, MD  Chief Complaint: Endocarditis  History of Present Illness:      Dale Mills is a 56 yo male with known history of Non-obstructive CAD, HLD, HTN, recurrent UTI/Pylonephritis infections, and recent hip replacement surgery (10/2022).  He presented to [facility] with complaints nausea, weakness, fever, and chills.  Workup in the ED showed the patient to be hypotensive, + leukocytosis, AKI with creatinine level of 2.6, and tachycardia.  He was started on fluids, which unfortunately let to flash pulmonary edema that required intubation.  He was started on broad spectrum ABX, blood cultures were obtained which showed Enterococcus Faecalis.  TTE was obtained and was concerning for a Mitral Valve Vegetation.  He was transferred to [facility] for further care.  Urgent Cardiology consult was obtained due to loud murmurs on exam.  Stat Echocardiogram was performed and showed normal biventricular function, there was severe AI prompting concern for Aortic Dissection and ICU TEE was performed at bedside.  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  Dissection was able to be excluded with TEE.  He remains critically ill, intubated, on broad spectrum antibiotics, and Levophed for hypotensive septic shock.  Cardiothoracic surgery consultation has been requested.  The patient is sedated on vent.  His daughters are at bedside.  They states he hasn't been himself since his hip surgery.  They states he just kept saying I was fine until I had this done.   He has been having episodic N/V 2-3 times per day.  They state his primary physician felt it was related to anemia and he was scheduled to have IV iron infusion this coming Friday.  He has his own teeth and was most recently seen at the dentist prior to his hip replacement.  Current Activity/ Functional Status: Patient was independent with mobility/ambulation, transfers, ADL's, IADL's.   Zubrod Score: At the time of surgery this patient's most appropriate activity status/level should be described as: []     0    Normal activity, no symptoms []     1    Restricted in physical strenuous activity but ambulatory, able to do out light work []     2    Ambulatory and capable of self care, unable to do work activities, up and about                 more than 50%  Of the time                            []     3    Only limited self care, in bed greater than 50% of waking hours []     4    Completely disabled, no self care, confined to bed or chair []     5    Moribund  Past Medical History:  Diagnosis Date   Angina pectoris (HCC) [place] classification 2 04/10/2020   Anxiety    Arthritis    Coronary artery disease coronary CT  [address]       [place] [address]             [phone]        SHENOUDA MOTOLA [facility] Medical Record #[id number] Date of Birth: 1966/03/16  Referring: Virgil Benedict, MD Primary Care: Nonnie Done., MD Primary Cardiologist:Robert Bing Matter, MD  Chief Complaint: Endocarditis  History of Present Illness:      Dale Mills is a 56 yo male with known history of Non-obstructive CAD, HLD, HTN, recurrent UTI/Pylonephritis infections, and recent hip replacement surgery (10/2022).  He presented to [facility] with complaints nausea, weakness, fever, and chills.  Workup in the ED showed the patient to be hypotensive, + leukocytosis, AKI with creatinine level of 2.6, and tachycardia.  He was started on fluids, which unfortunately let to flash pulmonary edema that required intubation.  He was started on broad spectrum ABX, blood cultures were obtained which showed Enterococcus Faecalis.  TTE was obtained and was concerning for a Mitral Valve Vegetation.  He was transferred to [facility] for further care.  Urgent Cardiology consult was obtained due to loud murmurs on exam.  Stat Echocardiogram was performed and showed normal biventricular function, there was severe AI prompting concern for Aortic Dissection and ICU TEE was performed at bedside.  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  Dissection was able to be excluded with TEE.  He remains critically ill, intubated, on broad spectrum antibiotics, and Levophed for hypotensive septic shock.  Cardiothoracic surgery consultation has been requested.  The patient is sedated on vent.  His daughters are at bedside.  They states he hasn't been himself since his hip surgery.  They states he just kept saying I was fine until I had this done.   He has been having episodic N/V 2-3 times per day.  They state his primary physician felt it was related to anemia and he was scheduled to have IV iron infusion this coming Friday.  He has his own teeth and was most recently seen at the dentist prior to his hip replacement.  Current Activity/ Functional Status: Patient was independent with mobility/ambulation, transfers, ADL's, IADL's.   Zubrod Score: At the time of surgery this patient's most appropriate activity status/level should be described as: []     0    Normal activity, no symptoms []     1    Restricted in physical strenuous activity but ambulatory, able to do out light work []     2    Ambulatory and capable of self care, unable to do work activities, up and about                 more than 50%  Of the time                            []     3    Only limited self care, in bed greater than 50% of waking hours []     4    Completely disabled, no self care, confined to bed or chair []     5    Moribund  Past Medical History:  Diagnosis Date   Angina pectoris (HCC) [place] classification 2 04/10/2020   Anxiety    Arthritis    Coronary artery disease coronary CT  [address]       [place] [address]             [phone]        SHENOUDA MOTOLA [facility] Medical Record #[id number] Date of Birth: 1966/03/16  Referring: Virgil Benedict, MD Primary Care: Nonnie Done., MD Primary Cardiologist:Robert Bing Matter, MD  Chief Complaint: Endocarditis  History of Present Illness:      Dale Mills is a 56 yo male with known history of Non-obstructive CAD, HLD, HTN, recurrent UTI/Pylonephritis infections, and recent hip replacement surgery (10/2022).  He presented to [facility] with complaints nausea, weakness, fever, and chills.  Workup in the ED showed the patient to be hypotensive, + leukocytosis, AKI with creatinine level of 2.6, and tachycardia.  He was started on fluids, which unfortunately let to flash pulmonary edema that required intubation.  He was started on broad spectrum ABX, blood cultures were obtained which showed Enterococcus Faecalis.  TTE was obtained and was concerning for a Mitral Valve Vegetation.  He was transferred to [facility] for further care.  Urgent Cardiology consult was obtained due to loud murmurs on exam.  Stat Echocardiogram was performed and showed normal biventricular function, there was severe AI prompting concern for Aortic Dissection and ICU TEE was performed at bedside.  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  Dissection was able to be excluded with TEE.  He remains critically ill, intubated, on broad spectrum antibiotics, and Levophed for hypotensive septic shock.  Cardiothoracic surgery consultation has been requested.  The patient is sedated on vent.  His daughters are at bedside.  They states he hasn't been himself since his hip surgery.  They states he just kept saying I was fine until I had this done.   He has been having episodic N/V 2-3 times per day.  They state his primary physician felt it was related to anemia and he was scheduled to have IV iron infusion this coming Friday.  He has his own teeth and was most recently seen at the dentist prior to his hip replacement.  Current Activity/ Functional Status: Patient was independent with mobility/ambulation, transfers, ADL's, IADL's.   Zubrod Score: At the time of surgery this patient's most appropriate activity status/level should be described as: []     0    Normal activity, no symptoms []     1    Restricted in physical strenuous activity but ambulatory, able to do out light work []     2    Ambulatory and capable of self care, unable to do work activities, up and about                 more than 50%  Of the time                            []     3    Only limited self care, in bed greater than 50% of waking hours []     4    Completely disabled, no self care, confined to bed or chair []     5    Moribund  Past Medical History:  Diagnosis Date   Angina pectoris (HCC) [place] classification 2 04/10/2020   Anxiety    Arthritis    Coronary artery disease coronary CT

## 2023-03-29 NOTE — Progress Notes (Signed)
NAME:  Dale Mills, MRN:  [id number], DOB:  1966-07-11, LOS: 1 ADMISSION DATE:  03/28/2023, CONSULTATION DATE:  9/17 REFERRING MD:  August Saucer, CHIEF COMPLAINT:  endocarditis    History of Present Illness:  57 year old male patient with history as mentioned below which includes hypertension, coronary artery disease, recurrent urinary tract infections and pyelonephritis, and recent left hip arthroplasty with surgery completed back in April 2024.  Presents to the emergency room with chief complaint of nausea, lightheadedness, generalized weakness, fever and chills.  Presented from work on 9/16 with the above symptoms, initially found to be slightly hypotensive with systolic blood pressure 95 heart rate 92 white blood cell count of 20.4 hemoglobin 10.9 BUN was 39 and serum creatinine 2.6, baseline 0.6.  His BNP was 2880, UA was concerning for possible UTI chest x-ray was clear CT and abdomen was negative for acute findings a right upper quadrant ultrasound was obtained which showed cholelithiasis but no evidence of cholecystitis his heart rate continue to elevate while in the emergency room noted to be in sinus tachycardia heart rate 110s to 120s blood pressure remained in the soft 90s systolic cultures were sent he was administered IV crystalloid and 1 during resuscitation effort required increasing supplemental oxygen with progressive increased work of breathing and follow-up chest x-ray suggesting flash pulmonary edema.  He was unable to tolerate noninvasive ventilation and therefore was intubated.  His Tmax while in the emergency room was 105.8 his blood pressure at its lowest dropped to the 80s systolic.  He was started on dobutamine infusion and broad-spectrum antibiotics have been started as well working diagnosis at time of initial evaluation was sepsis/septic shock felt secondary to urinary tract source complicated by AKI, non-STEMI, and flash pulmonary edema.  He was admitted to the intensive care,  antibiotics were continued as were resuscitation efforts and biotics.  Blood cultures were positive for GPC in pairs and chains with PCR detecting e faecalis.  Transthoracic echocardiogram suggesting endocarditis because of these findings he was transferred to [facility] for definitive care Pertinent  Medical History  Recent Left hip replacement, pyelonephritis, hypertension, depression, anxiety, coronary artery disease, prior cardiac catheterization, prior hernia repair.  Last echocardiogram in July 2024 showed EF 55 to 60%, recurrent UTIs.  Discussed this Significant Hospital Events: Including procedures, antibiotic start and stop dates in addition to other pertinent events   9/16 admitted with septic shock initially felt urinary tract source, abx started 9/16 through 9/17 cultures positive for GPC in pairs and clusters, PCR suggesting E facialis, echocardiogram suggesting mitral valve endocarditis, transferred to [facility] TEE obtained at bedside and ccm admitting  Interim History / Subjective:  On fentanyl and versed drip Sedate but following some commands On PRVC On 4 mcg levo  Objective   Blood pressure (!) 95/52, pulse 79, temperature 99.3 F (37.4 C), temperature source Axillary, resp. rate (!) 22, weight 110.4 kg, SpO2 96%. CVP:  [4 mmHg-21 mmHg] 5 mmHg  Vent Mode: PRVC FiO2 (%):  [50 %] 50 % Set Rate:  [22 bmp] 22 bmp Vt Set:  [500 mL-600 mL] 600 mL PEEP:  [5 cmH20] 5 cmH20 Plateau Pressure:  [16 cmH20-17 cmH20] 17 cmH20   Intake/Output Summary (Last 24 hours) at 03/29/2023 0731 Last data filed at 03/29/2023 0600 Gross per 24 hour  Intake 1723.33 ml  Output 1355 ml  Net 368.33 ml   Filed Weights   03/29/23 0115  Weight: 110.4 kg    Examination: General: critically ill appearing on mech vent  HEENT: MM pink/moist; ETT in place Neuro: sedate following intermittent commands CV: s1s2, RRR; holosystolic murmur PULM:  dim clear BS bilaterally; on mech vent PRVC GI: soft, bsx4  active  Extremities: warm/dry, no edema  Skin: no rashes or lesions appreciated  Resolved Hospital Problem list     Assessment & Plan:  Septic shock secondary to GPC bacteremia and MV endocarditis  PCR + for E Facialis  -tee completed. Informally showing sig MV vegetation and AS and severe AVR Plan: -cont levo for map goal >65 -currently on vanc/unasyn; ID consulted; appreciate recs -follow cultures -will hold further IVF's w/ CVP 12 -will need CVTS consult  Acute hypoxic resp failure 2/2 pulmonary edema and possible element of ALI Plan: -will work on weaning sedation and possible SBT later this am -LTVV strategy with tidal volumes of 6-8 cc/kg ideal body weight -Wean PEEP/FiO2 for SpO2 >92% -VAP bundle in place -Daily SAT and SBT -PAD protocol in place -wean sedation for RASS goal 0 to -1  Acute renal failure secondary to septic shock Plan: -Trend BMP / urinary output -Replace electrolytes as indicated -Avoid nephrotoxic agents, ensure adequate renal perfusion  Acute metabolic encephalopathy 2/2 sepsis  Plan: -limit sedating meds -wean sedation for RASS 0 to -1 -will dc versed drip and switch to precedex if necessary  Chronic anemia  -no current evidence of bleeding Plan: -trend cbc  H/o CAD w/ elevated trop I HLD HTN -trop I elevated likely 2/2 acute endocarditis  Plan: -tele monitoring -asa and statin -hold home antihypertensives while hypotensive  Boarderline hyperglycemia Plan: -A1c pending -ssi and cbg monitoring   Best Practice (right click and "Reselect all SmartList Selections" daily)   Diet/type: NPO w/ meds via tube DVT prophylaxis: prophylactic heparin  GI prophylaxis: H2B Lines: Central line Foley:  Yes, and it is still needed Code Status:  full code Last date of multidisciplinary goals of care discussion [pending]  Labs   CBC: Recent Labs  Lab 03/28/23 1629 03/29/23 0304  WBC  --  18.0*  HGB 8.8* 8.2*  HCT 26.0* 25.6*  MCV   --  77.6*  PLT  --  122*    Basic Metabolic Panel: Recent Labs  Lab 03/28/23 1629 03/29/23 0304  NA 135 137  K 4.4 4.4  CL  --  103  CO2  --  23  GLUCOSE  --  191*  BUN  --  63*  CREATININE  --  2.70*  CALCIUM  --  8.4*  MG  --  2.1   GFR: Estimated Creatinine Clearance: 38.1 mL/min (A) (by C-G formula based on SCr of 2.7 mg/dL (H)). Recent Labs  Lab 03/29/23 0304  WBC 18.0*    Liver Function Tests: Recent Labs  Lab 03/29/23 0304  AST 49*  ALT 29  ALKPHOS 104  BILITOT 2.2*  PROT 5.7*  ALBUMIN 2.0*   No results for input(s): "LIPASE", "AMYLASE" in the last 168 hours. No results for input(s): "AMMONIA" in the last 168 hours.  ABG    Component Value Date/Time   PHART 7.357 03/28/2023 1629   PCO2ART 39.3 03/28/2023 1629   PO2ART 83 03/28/2023 1629   HCO3 22.2 03/28/2023 1629   TCO2 23 03/28/2023 1629   ACIDBASEDEF 3.0 (H) 03/28/2023 1629   O2SAT 96 03/28/2023 1629     Coagulation Profile: No results for input(s): "INR", "PROTIME" in the last 168 hours.  Cardiac Enzymes: No results for input(s): "CKTOTAL", "CKMB", "CKMBINDEX", "TROPONINI" in the last 168 hours.  HbA1C: No  results found for: "HGBA1C"  CBG: Recent Labs  Lab 03/28/23 1601 03/28/23 1948 03/28/23 2330 03/29/23 0049 03/29/23 0311  GLUCAP 180* 157* 167* 160* 195*    Review of Systems:   Not able   Past Medical History:  He,  has a past medical history of Angina pectoris (HCC) Canadian classification 2 (04/10/2020), Anxiety, Arthritis, Coronary artery disease coronary CT angio did not show any major issues, suspicion for distal disease (06/12/2020), Depression, DJD (degenerative joint disease), Dyslipidemia (02/13/2020), Essential hypertension (02/13/2020), and Precordial chest pain (02/13/2020).   Surgical History:   Past Surgical History:  Procedure Laterality Date   COLONOSCOPY W/ POLYPECTOMY     FOREARM SURGERY     MASS REMOVED   HIP ARTHROSCOPY Left    LASIK Bilateral     TONSILECTOMY, ADENOIDECTOMY, BILATERAL MYRINGOTOMY AND TUBES  2011   UMBILICAL HERNIA REPAIR     VASECTOMY       Social History:   reports that he has never smoked. He has never used smokeless tobacco. He reports that he does not drink alcohol and does not use drugs.   Family History:  His family history includes CAD in his mother; Cancer in his child; Heart attack in his father and paternal grandfather; Kidney failure in his mother.   Allergies No Known Allergies   Home Medications  Prior to Admission medications   Medication Sig Start Date End Date Taking? Authorizing Provider  aspirin EC 81 MG tablet Take 81 mg by mouth daily. Swallow whole.    [provider]  atenolol (TENORMIN) 25 MG tablet Take 12.5 mg by mouth daily. 02/07/20   [provider]  atorvastatin (LIPITOR) 80 MG tablet Take 1 tablet (80 mg total) by mouth daily. 05/02/22   Georgeanna Lea, MD  lisinopril (ZESTRIL) 20 MG tablet Take 20 mg by mouth daily. 11/30/19   [provider]  nitroGLYCERIN (NITROSTAT) 0.4 MG SL tablet Place 1 tablet (0.4 mg total) under the tongue every 5 (five) minutes as needed for chest pain. 02/13/20 10/03/22  Georgeanna Lea, MD  ranolazine (RANEXA) 1000 MG SR tablet Take 1 tablet by mouth once daily 04/15/22   Georgeanna Lea, MD  tadalafil (CIALIS) 20 MG tablet Take 1 tablet by mouth every 3 (three) days.  12/23/19   [provider]     Critical care time: 35 min     JD Daryel [facility] 03/29/2023, 9:02 AM  Please see [email or web address] for pager details.  From 7A-7P if no response, please call [phone]. After hours, please call ELink [phone].

## 2023-03-29 NOTE — Consult Note (Signed)
[facility]    Date of Admission:  03/28/2023     Total days of antibiotics 2               Reason for Consult: Enterococcal bacteremia/endocarditis  Referring Provider: Dr. 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 Source of infection is unclear with possibility discussed being UTI as Enterococcus in urine although could be from suspected disseminated infection. Also has a prosthetic left hip that was placed in April 2024 that infection cannot be ruled out without additional work up that is on hold secondary to his serious cardiac status with CVTS planning for potential surgery on Friday. Blood cultures on arrival have been clear <12 hours which is encouraging. Antibiotics narrowed to ampicillin and ceftriaxone. Will need line holiday when appropriate as central line was placed while he was bacteremic and will defer timing to CCM. Discussed plan of care with daughters at bedside. Remaining medical and supportive care per CCM.   PLAN:  Continue current dose of ampicillin and ceftriaxone.  Monitor blood cultures for clearance of bacteremia. Consider additional blood cultures tomorrow. Surgical interventions per CVTS with potential for Friday surgery. Will need left hip imaging when appropriate to rule out possibility of left hip prosthetic joint infection Central line holiday when appropriate per CCM.  Remaining medical and supportive care per CCM.    Principal Problem:   Endocarditis Active Problems:   Enterococcal bacteremia   Acute respiratory failure (HCC)   Septic shock (HCC)    aspirin  325 mg Per Tube Daily    atorvastatin  80 mg Per Tube Daily   Chlorhexidine Gluconate Cloth  6 each Topical Daily   docusate  100 mg Per Tube BID   famotidine  20 mg Per Tube BID   fentaNYL (SUBLIMAZE) injection  50 mcg Intravenous Once   heparin  5,000 Units Subcutaneous Q8H   insulin aspart  0-9 Units Subcutaneous Q4H   mouth rinse  15 mL Mouth Rinse Q2H   polyethylene glycol  17 g Per Tube Daily     HPI: Dale Mills is a 57 y.o. male with previous medical history of coronary artery disease, hypertension, and left hip arthroplasty (April 2024) transferred from [facility] with Enterococcal bacteremia and aortic and mitral valve endocarditis.   Dale Mills presented to [facility] on 03/27/23 with generalized weakness, night sweats, chills and episodes of nausea and vomiting. Afebrile upon presentation. Chest x-ray unremarkable. Experienced increased hypoxia and tachypnea with inability to tolerate BiPap was intubated and central line placed with concern for sepsis. Fever spiked at 105 F. [place] RUQ with cholelithasis without acute cholecystitis. CT chest/abdomen/pelvis with no acute findings. Blood cultures found to be positive for Enterococcus faecalis which was also present in the urine. Started on broad spectrum coverage with vancomycin and piperacillin-tazobactam. Transthoracic echocardiogram concerning for endocarditis and was transferred to [facility] for additional treatment.   Dale Mills underwent emergent TEE with concern for aortic dissection and found to have mitral valve endocarditis with large multilobed vegetation and 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 and small aortic valve vegetation with severe aortic regurgitation; and aortic root aneurysm. Afebrile since arrival to  [facility] with WBC count of 18. Antibiotics changed to vancomycin and ampicillin-sulbactam. Blood cultures drawn from 03/29/23 are without growth thus far.  History is provided by Dale Mills daughters aas he is currently intubated and sedated. Has been having issues with nausea since his hip replacement surgery in April 2024 and has been having increasing left hip pain recently. Having night sweats and malaise. Seen by Primary Care with concern for iron deficiency anemia and was scheduled for iron infusion upcoming.    Review of Systems: Review of Systems  Unable to perform ROS: Intubated     Past Medical History:  Diagnosis Date   Angina pectoris (HCC) Canadian classification 2 04/10/2020   Anxiety    Arthritis    Coronary artery disease coronary CT angio did not show any major issues, suspicion for distal disease 06/12/2020   Depression    DJD (degenerative joint disease)    Dyslipidemia 02/13/2020   Essential hypertension 02/13/2020   Precordial chest pain 02/13/2020    Social History   Tobacco Use   Smoking status: Never   Smokeless tobacco: Never  Vaping Use   Vaping status: Never Used  Substance Use Topics   Alcohol use: Never   Drug use: Never    Family History  Problem Relation Age of Onset   CAD Mother    Kidney failure Mother    Heart attack Father    Heart attack Paternal Grandfather    Cancer Child        RARE BONE CANCER RESULTING IN TOTAL AMPUTATION OF LIMB    No Known Allergies  OBJECTIVE: Blood pressure (!) 96/58, pulse 75, temperature 99.2 F (37.3 C), temperature source Oral, resp. rate (!) 22, weight 110.4 kg, SpO2 96%.  Physical Exam Constitutional:      General: He is not in acute distress.    Appearance: He is well-developed. He is ill-appearing.     Interventions: He is sedated and intubated.  Cardiovascular:     Rate and Rhythm: Normal rate and regular rhythm.     Heart sounds: Murmur heard.     Comments: Central line right internal jugular. Pulmonary:     Effort: Pulmonary effort is normal. He is intubated.     Breath sounds: Normal breath sounds.  Skin:    General: Skin is warm and dry.     Lab  Results Lab Results  Component Value Date   WBC 18.0 (H) 03/29/2023   HGB 8.2 (L) 03/29/2023   HCT 25.6 (L) 03/29/2023   MCV 77.6 (L) 03/29/2023   PLT 122 (L) 03/29/2023    Lab Results  Component Value Date   CREATININE 2.70 (H) 03/29/2023   BUN 63 (H) 03/29/2023   NA 137 03/29/2023   K 4.4 03/29/2023   CL 103 03/29/2023   CO2 23 03/29/2023    Lab Results  Component Value Date   ALT 29 03/29/2023   AST 49 (H) 03/29/2023   ALKPHOS 104 03/29/2023   BILITOT 2.2 (H) 03/29/2023     Microbiology: Recent Results (from the past 240 hour(s))  MRSA Next Gen by PCR, Nasal     Status: None   Collection Time: 03/28/23  5:38 PM   Specimen: Nasal Mucosa; Nasal Swab  Result Value Ref Range Status   MRSA by PCR Next Gen NOT DETECTED NOT DETECTED Final    Comment: (NOTE) The GeneXpert MRSA Assay (FDA approved for NASAL specimens only), is one component of a comprehensive MRSA colonization surveillance program. It is  not intended to diagnose MRSA infection nor to guide or monitor treatment for MRSA infections. Test performance is not FDA approved in patients less than 62 years old. Performed at [facility], [address] [place], [place] [address]   Culture, blood (Routine X 2) w Reflex to ID Panel     Status: None (Preliminary result)   Collection Time: 03/29/23  3:01 AM   Specimen: BLOOD RIGHT HAND  Result Value Ref Range Status   Specimen Description BLOOD RIGHT HAND  Final   Special Requests   Final    BOTTLES DRAWN AEROBIC AND ANAEROBIC Blood Culture adequate volume   Culture   Final    NO GROWTH < 12 HOURS Performed at [facility], [address], [place], [place] [address]    Report Status PENDING  Incomplete  Culture, blood (Routine X 2) w Reflex to ID Panel     Status: None (Preliminary result)   Collection Time: 03/29/23  3:04 AM   Specimen: BLOOD RIGHT ARM  Result Value Ref Range Status   Specimen Description BLOOD RIGHT ARM  Final   Special Requests    Final    BOTTLES DRAWN AEROBIC AND ANAEROBIC Blood Culture adequate volume   Culture   Final    NO GROWTH < 12 HOURS Performed at [facility], [address], [place], [place] [address]    Report Status PENDING  Incomplete     Marcos Eke, NP [facility]  03/29/2023  2:38 PM

## 2023-03-29 NOTE — Consult Note (Signed)
Neurology Consultation Reason for Consult: Stroke Referring Physician: Icard, B  CC: Altered mental status  History is obtained from: Chart review  HPI: Dale Mills is a 57 y.o. male who was transferred from [facility] with endocarditis and found to have Enterococcus faecalis mitral valve endocarditis.  Due to persistent encephalopathy a head CT was obtained today which demonstrates a fairly large right PCA infarct.  Neurology has been consulted for this reason.   LKW: Unclear tnk given?: no, unclear time of onset  Past Medical History:  Diagnosis Date   Angina pectoris (HCC) Canadian classification 2 04/10/2020   Anxiety    Arthritis    Coronary artery disease coronary CT angio did not show any major issues, suspicion for distal disease 06/12/2020   Depression    DJD (degenerative joint disease)    Dyslipidemia 02/13/2020   Essential hypertension 02/13/2020   Precordial chest pain 02/13/2020     Family History  Problem Relation Age of Onset   CAD Mother    Kidney failure Mother    Heart attack Father    Heart attack Paternal Grandfather    Cancer Child        RARE BONE CANCER RESULTING IN TOTAL AMPUTATION OF LIMB     Social History:  reports that he has never smoked. He has never used smokeless tobacco. He reports that he does not drink alcohol and does not use drugs.   Exam: Current vital signs: BP 103/60   Pulse 78   Temp (!) 101.8 F (38.8 C) (Bladder)   Resp (!) 22   Wt 110.4 kg   SpO2 93%   BMI 33.95 kg/m  Vital signs in last 24 hours: Temp:  [98.3 F (36.8 C)-101.8 F (38.8 C)] 101.8 F (38.8 C) (09/18 1559) Pulse Rate:  [74-173] 78 (09/18 1500) Resp:  [20-59] 22 (09/18 1500) BP: (85-111)/(49-95) 103/60 (09/18 1500) SpO2:  [78 %-100 %] 93 % (09/18 1651) FiO2 (%):  [50 %] 50 % (09/18 1332) Weight:  [110.4 kg] 110.4 kg (09/18 0115)   Physical Exam  Appears well-developed and well-nourished.   Of note, the patient had been agitated  and therefore was bolused with sedation shortly before my exam. Neuro: Mental Status: I was able to get him to arouse, he was agitated and did not cooperate with exam, unclear if due to cooperation versus inability to follow commands.  He did actively resist me and pushed away. Cranial Nerves: II: He keeps his eyes tightly shut, fighting against checking blink to threat. Pupils are small but equal, round, and reactive to light.   III,IV, VI: Eyes are midline, but he resists doll's maneuver V, VII: Blinks to eyelid stimulation bilaterally Motor: He moves both sides relatively symmetrically. Sensory: He response to noxious stimulation bilaterally  Cerebellar: Does not perform   I have reviewed labs in epic and the results pertinent to this consultation are: Creatinine 2.7  I have reviewed the images obtained: CT head-large right PCA infarct  Impression: 57 year old male with embolic appearing infarct to the right PCA territory.  Given the presence of a single infarct, it is very possible that he has had multiple, and he will be going for MRI this afternoon to determine this.  Treatment for endocarditis related strokes as antibiotic therapy, no need for antithrombotics at this time unless there becomes a reason to think he has other etiologies.  Recommendations: - HgbA1c, fasting lipid panel - MRI of the brain without contrast - Frequent neuro checks - Prophylactic  therapy-okay with aspirin, but none needed from stroke prevention standpoint - Risk factor modification - Telemetry monitoring - PT consult, OT consult, Speech consult - Stroke team to follow    Ritta Slot, MD [facility] [phone]  If 7pm- 7am, please page neurology on call as listed in AMION.

## 2023-03-29 NOTE — Progress Notes (Signed)
Patient was transported to MRI and back to 2H20 without any complications.

## 2023-03-29 NOTE — Progress Notes (Signed)
Initial Nutrition Assessment  DOCUMENTATION CODES:   Severe malnutrition in context of acute illness/injury  INTERVENTION:   Tube Feeding via OG:  Recommend trickles only today given hx of prolonged N/V and "explosive" diarrhea PTA of Vital 1.5 at 20 ml/hr  Goal recommendation: Vital 1.5 at 60 ml/hr with Pro-source TF20 60 mL BID TF at goal provides 137 g of protein, 2320 kcals and 1094 mL of free water  Pro-Source TF20 60 mL QID , each packet provides 20 grams of protein and 80 kcals.   Add Thiamine 100 mg daily per tube x 5 days  Recommend holding scheduled bowel regimen today given recent hx of vomiting and initiation of TF today.   NUTRITION DIAGNOSIS:   Severe Malnutrition related to acute illness (possible chronic component as well) as evidenced by moderate fat depletion, moderate muscle depletion.  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 PMH includes CAD, anxiety, arthritis, dyslipidemia, HTN  Pt remains on vent support Levophed at 4  CT surgery consulted, pt with dilated Ao root, ?AoV and MV endocarditis Bacteremia with enterococcus faecalis CT head and chest pending  OG tube in stomach per abd xray  Pt with N/V 2-3 times per day for several weeks, possibly months PTA. Family reports pt told them 2 weeks ago he was experiencing N/V but pt indicated then that it was going on for at least a month. Pt was eating cookies for breakfast as this was one of few things he could tolerated well, otherwise eating bites recently  In addition to N/V, family reports pt has experienced diarrhea at  times ("explosive diarrhea" with incontinence)  Abdomen soft, BS hypoactive, +Flatus, no BM  Family indicates that pt had a hip replacement in April of this year and pt has indicated that he feels like this is when he began to decline.   Family indicates pt has lost weight, family even able to show pictures from a few weeks ago and as far back as Christmas and weight loss is apparent based on pictures and is confirmed by [facility]  Family reports pt works at [employer] and is active/on his feet all day. Pt has been not able to work full shifts recently due to weakness  Labs: BUN 63, Creatinine 2.70 Meds: colace BID, miralax BID, ss novolog   NUTRITION - FOCUSED PHYSICAL EXAM:  Flowsheet Row Most Recent Value  Orbital Region Moderate depletion  Upper Arm Region Unable to assess  Thoracic and Lumbar Region Unable to assess  Buccal Region Moderate depletion  Temple Region Severe depletion  Clavicle Bone Region Moderate depletion  Clavicle and Acromion Bone Region Moderate depletion  Scapular Bone Region Mild depletion  Dorsal Hand Unable to assess  Patellar Region Mild depletion  Anterior Thigh Region Mild depletion  Posterior Calf Region Mild depletion  Edema (RD Assessment) Mild       Diet Order:   Diet Order             Diet NPO time specified  Diet effective now                   EDUCATION NEEDS:   Education  needs have been addressed (discussed nutrition plan with family)  Skin:  Skin Assessment: Reviewed RN Assessment  Last BM:  PTA, recent hx of diarrhea, N/V  Height:   Ht Readings from Last 1 Encounters:  03/17/23 5\' 11"  (1.803 m)    Weight:   Wt Readings from Last 1 Encounters:  03/29/23 110.4 kg    BMI:  Body mass index is 33.95 kg/m.  Estimated Nutritional Needs:   Kcal:  2100-2300 kcals  Protein:  120-150 g  Fluid:  >/= 2L    Romelle Starcher MS, RDN, LDN, CNSC Registered Dietitian 3 Clinical Nutrition RD Pager and On-Call Pager  Number Located in [place]

## 2023-03-29 NOTE — Plan of Care (Signed)

## 2023-03-30 ENCOUNTER — Inpatient Hospital Stay (HOSPITAL_COMMUNITY): Payer: Medicaid Other

## 2023-03-30 ENCOUNTER — Encounter: Payer: Self-pay | Admitting: Oncology

## 2023-03-30 DIAGNOSIS — E43 Unspecified severe protein-calorie malnutrition: Secondary | ICD-10-CM

## 2023-03-30 DIAGNOSIS — J9690 Respiratory failure, unspecified, unspecified whether with hypoxia or hypercapnia: Secondary | ICD-10-CM

## 2023-03-30 DIAGNOSIS — I609 Nontraumatic subarachnoid hemorrhage, unspecified: Secondary | ICD-10-CM

## 2023-03-30 HISTORY — DX: Unspecified severe protein-calorie malnutrition: E43

## 2023-03-30 LAB — CBC
HCT: 30.8 % — ABNORMAL LOW (ref 39.0–52.0)
Hemoglobin: 9.7 g/dL — ABNORMAL LOW (ref 13.0–17.0)
MCH: 25.4 pg — ABNORMAL LOW (ref 26.0–34.0)
MCHC: 31.5 g/dL (ref 30.0–36.0)
MCV: 80.6 fL (ref 80.0–100.0)
Platelets: 231 10*3/uL (ref 150–400)
RBC: 3.82 MIL/uL — ABNORMAL LOW (ref 4.22–5.81)
RDW: 18.5 % — ABNORMAL HIGH (ref 11.5–15.5)
WBC: 27.8 10*3/uL — ABNORMAL HIGH (ref 4.0–10.5)
nRBC: 0.1 % (ref 0.0–0.2)

## 2023-03-30 LAB — RAPID URINE DRUG SCREEN, HOSP PERFORMED
Amphetamines: NOT DETECTED
Barbiturates: NOT DETECTED
Benzodiazepines: POSITIVE — AB
Cocaine: NOT DETECTED
Opiates: NOT DETECTED
Tetrahydrocannabinol: NOT DETECTED

## 2023-03-30 LAB — BASIC METABOLIC PANEL
Anion gap: 8 (ref 5–15)
BUN: 65 mg/dL — ABNORMAL HIGH (ref 6–20)
CO2: 23 mmol/L (ref 22–32)
Calcium: 8.5 mg/dL — ABNORMAL LOW (ref 8.9–10.3)
Chloride: 111 mmol/L (ref 98–111)
Creatinine, Ser: 1.7 mg/dL — ABNORMAL HIGH (ref 0.61–1.24)
GFR, Estimated: 46 mL/min — ABNORMAL LOW (ref >60)
Glucose, Bld: 162 mg/dL — ABNORMAL HIGH (ref 70–99)
Potassium: 4.8 mmol/L (ref 3.5–5.1)
Sodium: 142 mmol/L (ref 135–145)

## 2023-03-30 LAB — ABO/RH: ABO/RH(D): O POS

## 2023-03-30 LAB — GLUCOSE, CAPILLARY
Glucose-Capillary: 151 mg/dL — ABNORMAL HIGH (ref 70–99)
Glucose-Capillary: 157 mg/dL — ABNORMAL HIGH (ref 70–99)
Glucose-Capillary: 164 mg/dL — ABNORMAL HIGH (ref 70–99)
Glucose-Capillary: 171 mg/dL — ABNORMAL HIGH (ref 70–99)
Glucose-Capillary: 173 mg/dL — ABNORMAL HIGH (ref 70–99)
Glucose-Capillary: 180 mg/dL — ABNORMAL HIGH (ref 70–99)

## 2023-03-30 LAB — MAGNESIUM
Magnesium: 2.4 mg/dL (ref 1.7–2.4)
Magnesium: 2.2 mg/dL (ref 1.7–2.4)

## 2023-03-30 LAB — PHOSPHORUS
Phosphorus: 3.9 mg/dL (ref 2.5–4.6)
Phosphorus: 3.5 mg/dL (ref 2.5–4.6)

## 2023-03-30 MED ORDER — SODIUM CHLORIDE 0.9 % IV SOLN
2.0000 g | INTRAVENOUS | Status: DC
  Administered 2023-03-30 – 2023-04-09 (×59): 2 g via INTRAVENOUS
  Filled 2023-03-30 (×69): qty 2000

## 2023-03-30 MED ORDER — MILRINONE LACTATE IN DEXTROSE 20-5 MG/100ML-% IV SOLN
0.3000 ug/kg/min | INTRAVENOUS | Status: DC
  Filled 2023-03-30: qty 100

## 2023-03-30 MED ORDER — CHLORHEXIDINE GLUCONATE 0.12 % MT SOLN
15.0000 mL | Freq: Once | OROMUCOSAL | Status: AC
  Administered 2023-03-31: 15 mL via OROMUCOSAL
  Filled 2023-03-30: qty 15

## 2023-03-30 MED ORDER — BISACODYL 5 MG PO TBEC
5.0000 mg | DELAYED_RELEASE_TABLET | Freq: Once | ORAL | Status: DC
  Filled 2023-03-30: qty 1

## 2023-03-30 MED ORDER — CHLORHEXIDINE GLUCONATE CLOTH 2 % EX PADS
6.0000 | MEDICATED_PAD | Freq: Once | CUTANEOUS | Status: AC
  Administered 2023-03-31: 6 via TOPICAL

## 2023-03-30 MED ORDER — TRANEXAMIC ACID (OHS) BOLUS VIA INFUSION
15.0000 mg/kg | INTRAVENOUS | Status: AC
  Administered 2023-03-31: 1677 mg via INTRAVENOUS
  Filled 2023-03-30: qty 1677

## 2023-03-30 MED ORDER — VANCOMYCIN HCL 1000 MG IV SOLR
INTRAVENOUS | Status: DC
  Filled 2023-03-30: qty 20

## 2023-03-30 MED ORDER — DEXMEDETOMIDINE HCL IN NACL 400 MCG/100ML IV SOLN
0.1000 ug/kg/h | INTRAVENOUS | Status: AC
  Administered 2023-03-31: .4 ug/kg/h via INTRAVENOUS
  Filled 2023-03-30 (×2): qty 100

## 2023-03-30 MED ORDER — EPINEPHRINE HCL 5 MG/250ML IV SOLN IN NS
0.0000 ug/min | INTRAVENOUS | Status: DC
  Filled 2023-03-30: qty 250

## 2023-03-30 MED ORDER — POTASSIUM CHLORIDE 2 MEQ/ML IV SOLN
80.0000 meq | INTRAVENOUS | Status: DC
  Filled 2023-03-30: qty 40

## 2023-03-30 MED ORDER — HEPARIN 30,000 UNITS/1000 ML (OHS) CELLSAVER SOLUTION
Status: DC
  Filled 2023-03-30: qty 1000

## 2023-03-30 MED ORDER — ASPIRIN 81 MG PO CHEW
324.0000 mg | CHEWABLE_TABLET | Freq: Every day | ORAL | Status: DC
  Administered 2023-03-30: 324 mg
  Filled 2023-03-30 (×2): qty 4

## 2023-03-30 MED ORDER — METOPROLOL TARTRATE 12.5 MG HALF TABLET: 12.5000 mg | ORAL_TABLET | Freq: Once | ORAL | Status: DC

## 2023-03-30 MED ORDER — EZETIMIBE 10 MG PO TABS
10.0000 mg | ORAL_TABLET | Freq: Every day | ORAL | Status: DC
  Administered 2023-03-30: 10 mg
  Filled 2023-03-30: qty 1

## 2023-03-30 MED ORDER — FUROSEMIDE 10 MG/ML IJ SOLN
60.0000 mg | Freq: Two times a day (BID) | INTRAMUSCULAR | Status: DC
  Administered 2023-03-30 (×2): 60 mg via INTRAVENOUS
  Filled 2023-03-30 (×2): qty 6

## 2023-03-30 MED ORDER — INSULIN REGULAR(HUMAN) IN NACL 100-0.9 UT/100ML-% IV SOLN
INTRAVENOUS | Status: AC
  Administered 2023-03-31: 2.4 [IU]/h via INTRAVENOUS
  Filled 2023-03-30: qty 100

## 2023-03-30 MED ORDER — MIDAZOLAM HCL 2 MG/2ML IJ SOLN
2.0000 mg | INTRAMUSCULAR | Status: DC | PRN
  Administered 2023-03-30: 2 mg via INTRAVENOUS
  Filled 2023-03-30: qty 2

## 2023-03-30 MED ORDER — VANCOMYCIN HCL 1500 MG/300ML IV SOLN
1500.0000 mg | INTRAVENOUS | Status: AC
  Administered 2023-03-31: 1500 mg via INTRAVENOUS
  Filled 2023-03-30: qty 300

## 2023-03-30 MED ORDER — CEFAZOLIN SODIUM-DEXTROSE 2-4 GM/100ML-% IV SOLN
2.0000 g | INTRAVENOUS | Status: DC
  Filled 2023-03-30: qty 100

## 2023-03-30 MED ORDER — NOREPINEPHRINE 4 MG/250ML-% IV SOLN
0.0000 ug/min | INTRAVENOUS | Status: AC
  Administered 2023-03-31: 4 ug/min via INTRAVENOUS
  Filled 2023-03-30: qty 250

## 2023-03-30 MED ORDER — TRANEXAMIC ACID (OHS) PUMP PRIME SOLUTION
2.0000 mg/kg | INTRAVENOUS | Status: DC
  Filled 2023-03-30: qty 2.24

## 2023-03-30 MED ORDER — NITROGLYCERIN IN D5W 200-5 MCG/ML-% IV SOLN
2.0000 ug/min | INTRAVENOUS | Status: DC
  Filled 2023-03-30: qty 250

## 2023-03-30 MED ORDER — TRANEXAMIC ACID 1000 MG/10ML IV SOLN
1.5000 mg/kg/h | INTRAVENOUS | Status: AC
  Administered 2023-03-31: 1.5 mg/kg/h via INTRAVENOUS
  Filled 2023-03-30: qty 25

## 2023-03-30 MED ORDER — POLYETHYLENE GLYCOL 3350 17 G PO PACK: 17.0000 g | PACK | Freq: Every day | ORAL | Status: DC | PRN

## 2023-03-30 MED ORDER — VANCOMYCIN HCL 1250 MG/250ML IV SOLN
1250.0000 mg | INTRAVENOUS | Status: DC
  Filled 2023-03-30: qty 250

## 2023-03-30 MED ORDER — TEMAZEPAM 15 MG PO CAPS: 15.0000 mg | ORAL_CAPSULE | Freq: Once | ORAL | Status: DC | PRN

## 2023-03-30 MED ORDER — MANNITOL 20 % IV SOLN
INTRAVENOUS | Status: DC
  Filled 2023-03-30: qty 13

## 2023-03-30 MED ORDER — CEFAZOLIN SODIUM-DEXTROSE 2-4 GM/100ML-% IV SOLN
2.0000 g | INTRAVENOUS | Status: AC
  Administered 2023-03-31 (×2): 2 g via INTRAVENOUS
  Filled 2023-03-30: qty 100

## 2023-03-30 MED ORDER — PHENYLEPHRINE HCL-NACL 20-0.9 MG/250ML-% IV SOLN
30.0000 ug/min | INTRAVENOUS | Status: DC
  Filled 2023-03-30: qty 250

## 2023-03-30 MED ORDER — CHLORHEXIDINE GLUCONATE CLOTH 2 % EX PADS
6.0000 | MEDICATED_PAD | Freq: Once | CUTANEOUS | Status: AC
  Administered 2023-03-30: 6 via TOPICAL

## 2023-03-30 MED ORDER — PLASMA-LYTE A IV SOLN
INTRAVENOUS | Status: DC
  Filled 2023-03-30: qty 2.5

## 2023-03-30 NOTE — Progress Notes (Signed)
[address]       [place] [address]             [phone]         Procedure(s) (LRB): AORTIC VALVE REPLACEMENT (AVR) (N/A) MITRAL VALVE (MV) REPLACEMENT (N/A) ASCENDING AORTIC ROOT REPLACEMENT (N/A) TRANSESOPHAGEAL ECHOCARDIOGRAM (N/A)   Total Length of Stay:  LOS: 2 days    SUBJECTIVE: Has become more unresponsive per nursing overnight Vitals:   03/30/23 0630 03/30/23 0645  BP: 114/64 (!) 116/57  Pulse: 64 65  Resp: (!) 22 (!) 22  Temp: 99.3 F (37.4 C) 99.3 F (37.4 C)  SpO2: 100% 100%    Intake/Output      09/18 0701 09/19 0700 09/19 0701 09/20 0700   I.V. (mL/kg) 1200.2 (10.7)    NG/GT 348.3    IV Piggyback 639.5    Total Intake(mL/kg) 2188 (19.6)    Urine (mL/kg/hr) 1885 (0.7)    Emesis/NG output     Total Output 1885    Net +303             ampicillin (OMNIPEN) IV Stopped (03/30/23 0531)   cefTRIAXone (ROCEPHIN)  IV Stopped (03/29/23 2245)   dexmedetomidine (PRECEDEX) IV infusion 0.8 mcg/kg/hr (03/30/23 0600)   feeding supplement (VITAL 1.5 CAL) 20 mL/hr at 03/30/23 0600   fentaNYL infusion INTRAVENOUS 125 mcg/hr (03/30/23 0600)   norepinephrine (LEVOPHED) Adult infusion 4 mcg/min (03/30/23 0622)    CBC    Component Value Date/Time   WBC 27.8 (H) 03/30/2023 0412   RBC 3.82 (L) 03/30/2023 0412   HGB 9.7 (L) 03/30/2023 0412   HCT 30.8 (L) 03/30/2023 0412   PLT 231 03/30/2023 0412   MCV 80.6 03/30/2023 0412   MCH 25.4 (L) 03/30/2023 0412   MCHC 31.5 03/30/2023 0412   RDW 18.5 (H) 03/30/2023 0412   CMP     Component Value Date/Time   NA 142 03/30/2023 0412   K 4.8 03/30/2023 0412   CL 111 03/30/2023 0412   CO2 23 03/30/2023 0412   GLUCOSE 162 (H) 03/30/2023 0412   BUN 65 (H) 03/30/2023 0412   CREATININE 1.70 (H) 03/30/2023 0412   CREATININE 1.38 (H) 03/17/2023 0851   CALCIUM 8.5 (L) 03/30/2023 0412   PROT 5.7 (L) 03/29/2023 0304   PROT 6.4 11/13/2020 1330   ALBUMIN 2.0 (L) 03/29/2023 0304   ALBUMIN 4.0  11/13/2020 1330   AST 49 (H) 03/29/2023 0304   AST 23 03/17/2023 0851   ALT 29 03/29/2023 0304   ALT 27 03/17/2023 0851   ALKPHOS 104 03/29/2023 0304   BILITOT 2.2 (H) 03/29/2023 0304   BILITOT 1.0 03/17/2023 0851   GFRNONAA 46 (L) 03/30/2023 0412   GFRNONAA 60 (L) 03/17/2023 0851   ABG    Component Value Date/Time   PHART 7.357 03/28/2023 1629   PCO2ART 39.3 03/28/2023 1629   PO2ART 83 03/28/2023 1629   HCO3 22.2 03/28/2023 1629   TCO2 23 03/28/2023 1629   ACIDBASEDEF 3.0 (H) 03/28/2023 1629   O2SAT 96 03/28/2023 1629   CBG (last 3)  Recent Labs    03/29/23 2001 03/29/23 2345 03/30/23 0325  GLUCAP 153* 164* 173*     ASSESSMENT: Aortic and mitral endocarditis Has remained stable hemodynamically overnight with just levo for support Cr has lowered to 1.7 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 Will have neuro give prognostic impression and will see if can wake up more from sedation to observe for deficits  Eugenio Hoes, MD 03/30/2023

## 2023-03-30 NOTE — Progress Notes (Signed)
NAME:  Dale Mills, MRN:  [id number], DOB:  18-Feb-1966, LOS: 2 ADMISSION DATE:  03/28/2023, CONSULTATION DATE:  9/17 REFERRING MD:  August Saucer, CHIEF COMPLAINT:  endocarditis    History of Present Illness:  57 year old male patient with history as mentioned below which includes hypertension, coronary artery disease, recurrent urinary tract infections and pyelonephritis, and recent left hip arthroplasty with surgery completed back in April 2024.  Presents to the emergency room with chief complaint of nausea, lightheadedness, generalized weakness, fever and chills.  Presented from work on 9/16 with the above symptoms, initially found to be slightly hypotensive with systolic blood pressure 95 heart rate 92 white blood cell count of 20.4 hemoglobin 10.9 BUN was 39 and serum creatinine 2.6, baseline 0.6.  His BNP was 2880, UA was concerning for possible UTI chest x-ray was clear CT and abdomen was negative for acute findings a right upper quadrant ultrasound was obtained which showed cholelithiasis but no evidence of cholecystitis his heart rate continue to elevate while in the emergency room noted to be in sinus tachycardia heart rate 110s to 120s blood pressure remained in the soft 90s systolic cultures were sent he was administered IV crystalloid and 1 during resuscitation effort required increasing supplemental oxygen with progressive increased work of breathing and follow-up chest x-ray suggesting flash pulmonary edema.  He was unable to tolerate noninvasive ventilation and therefore was intubated.  His Tmax while in the emergency room was 105.8 his blood pressure at its lowest dropped to the 80s systolic.  He was started on dobutamine infusion and broad-spectrum antibiotics have been started as well working diagnosis at time of initial evaluation was sepsis/septic shock felt secondary to urinary tract source complicated by AKI, non-STEMI, and flash pulmonary edema.  He was admitted to the intensive care,  antibiotics were continued as were resuscitation efforts and biotics.  Blood cultures were positive for GPC in pairs and chains with PCR detecting e faecalis.  Transthoracic echocardiogram suggesting endocarditis because of these findings he was transferred to [facility] for definitive care  Pertinent  Medical History  Recent Left hip replacement, pyelonephritis, hypertension, depression, anxiety, coronary artery disease, prior cardiac catheterization, prior hernia repair.  Last echocardiogram in July 2024 showed EF 55 to 60%, recurrent UTIs.  Discussed this  Significant Hospital Events: Including procedures, antibiotic start and stop dates in addition to other pertinent events    9/16 admitted with septic shock initially felt urinary tract source, abx started 9/16 through 9/17 cultures positive for GPC in pairs and clusters, PCR suggesting E facialis, echocardiogram suggesting mitral valve endocarditis, transferred to [facility] TEE obtained at bedside and ccm admitting   Interim History / Subjective:   Remains on continuous infusions, intubated on mechanical life support critically ill.  Objective   Blood pressure (!) 116/59, pulse 65, temperature 99.3 F (37.4 C), resp. rate (!) 22, weight 111.8 kg, SpO2 99%. CVP:  [8 mmHg-16 mmHg] 9 mmHg  Vent Mode: PRVC FiO2 (%):  [50 %-60 %] 50 % Set Rate:  [22 bmp] 22 bmp Vt Set:  [600 mL] 600 mL PEEP:  [5 cmH20] 5 cmH20 Plateau Pressure:  [18 cmH20-19 cmH20] 19 cmH20   Intake/Output Summary (Last 24 hours) at 03/30/2023 0857 Last data filed at 03/30/2023 0700 Gross per 24 hour  Intake 2154.37 ml  Output 1885 ml  Net 269.37 ml   Filed Weights   03/29/23 0115 03/30/23 0349  Weight: 110.4 kg 111.8 kg    Examination: General: Critically ill intubated on mechanical  life support HEENT: Endotracheal tube in place, NCAT Neuro: Sedated following commands CV: Regular rate rhythm S1-S2, holosystolic murmur PULM: Bilateral mechanically ventilated  breath sounds GI: Soft nontender nondistended Extremities: No significant edema  Resolved Hospital Problem list     Assessment & Plan:   Septic shock secondary to GPC bacteremia and MV endocarditis  PCR + for E Facialis  -tee completed. Informally showing sig MV vegetation and AS and severe AVR Plan: Patient remains on vasopressors to maintain mean arterial pressure greater than 65 Continue on antibiotics per infectious disease Case discussed with cardiothoracic surgery, consideration for window of surgery potentially tomorrow. We will reassess in the afternoon.  Acute hypoxic resp failure 2/2 pulmonary edema and possible element of ALI Plan: Continue low tidal volume ventilation Wean PEEP and FiO2 to maintain sats above 90% VAP prophylaxis Daily SAT SBT PAD guideline sedation.  Acute renal failure secondary to septic shock Plan: Continue to follow BMP and urine output Replace electrolytes as indicated.  Acute metabolic encephalopathy 2/2 sepsis  Plan: Continue Precedex plus fentanyl  Chronic anemia  -no current evidence of bleeding Plan: -trend cbc  H/o CAD w/ elevated trop I HLD HTN -trop I elevated likely 2/2 acute endocarditis  Plan: Telemetry monitoring, remains on aspirin plus statin.  [place]erline hyperglycemia Plan: -A1c pending -ssi and cbg monitoring   Best Practice (right click and "Reselect all SmartList Selections" daily)   Diet/type: NPO w/ meds via tube DVT prophylaxis: prophylactic heparin  GI prophylaxis: H2B Lines: Central line Foley:  Yes, and it is still needed Code Status:  full code Last date of multidisciplinary goals of care discussion [pending]  Labs   CBC: Recent Labs  Lab 03/28/23 1629 03/29/23 0304 03/30/23 0412  WBC  --  18.0* 27.8*  HGB 8.8* 8.2* 9.7*  HCT 26.0* 25.6* 30.8*  MCV  --  77.6* 80.6  PLT  --  122* 231    Basic Metabolic Panel: Recent Labs  Lab 03/28/23 1629 03/29/23 0304 03/30/23 0412  NA 135  137 142  K 4.4 4.4 4.8  CL  --  103 111  CO2  --  23 23  GLUCOSE  --  191* 162*  BUN  --  63* 65*  CREATININE  --  2.70* 1.70*  CALCIUM  --  8.4* 8.5*  MG  --  2.1 2.4  PHOS  --   --  3.9   GFR: Estimated Creatinine Clearance: 61 mL/min (A) (by C-G formula based on SCr of 1.7 mg/dL (H)). Recent Labs  Lab 03/29/23 0304 03/30/23 0412  WBC 18.0* 27.8*    Liver Function Tests: Recent Labs  Lab 03/29/23 0304  AST 49*  ALT 29  ALKPHOS 104  BILITOT 2.2*  PROT 5.7*  ALBUMIN 2.0*   No results for input(s): "LIPASE", "AMYLASE" in the last 168 hours. No results for input(s): "AMMONIA" in the last 168 hours.  ABG    Component Value Date/Time   PHART 7.357 03/28/2023 1629   PCO2ART 39.3 03/28/2023 1629   PO2ART 83 03/28/2023 1629   HCO3 22.2 03/28/2023 1629   TCO2 23 03/28/2023 1629   ACIDBASEDEF 3.0 (H) 03/28/2023 1629   O2SAT 96 03/28/2023 1629     Coagulation Profile: No results for input(s): "INR", "PROTIME" in the last 168 hours.  Cardiac Enzymes: No results for input(s): "CKTOTAL", "CKMB", "CKMBINDEX", "TROPONINI" in the last 168 hours.  HbA1C: Hgb A1c MFr Bld  Date/Time Value Ref Range Status  03/29/2023 03:04 AM 6.2 (H) 4.8 -  5.6 % Final    Comment:    (NOTE)         Prediabetes: 5.7 - 6.4         Diabetes: >6.4         Glycemic control for adults with diabetes: <7.0     CBG: Recent Labs  Lab 03/29/23 1120 03/29/23 1603 03/29/23 2001 03/29/23 2345 03/30/23 0325  GLUCAP 165* 141* 153* 164* 173*    This patient is critically ill with multiple organ system failure; which, requires frequent high complexity decision making, assessment, support, evaluation, and titration of therapies. This was completed through the application of advanced monitoring technologies and extensive interpretation of multiple databases. During this encounter critical care time was devoted to patient care services described in this note for 32 minutes.  Josephine Igo,  DO [facility] 03/30/2023 8:57 AM

## 2023-03-30 NOTE — Progress Notes (Addendum)
STROKE TEAM PROGRESS NOTE   BRIEF HPI [name] is a 57 y.o. male with history of hypertension, CAD, UTIs with pyelonephritis, left hip arthroplasty in April 2024, depression, anxiety and hernia repair who originally presented with nausea lightheadedness, generalized weakness, fever and chills.  He was found to have a UTI and was noted to be septic.  He required intubation and blood pressure support with pressors.  He was found to have endocarditis and was transferred here for definitive care.  He is being treated with appropriate antibiotics.  He is currently undergoing evaluation for mitral valve surgery.  MRI brain performed during his workup demonstrated an embolic appearing right PCA stroke.  He also had several smaller, scattered strokes.   SIGNIFICANT HOSPITAL EVENTS 9/17 transferred to [facility] for treatment of endocarditis 9/18 found to have right PCA stroke on MRI  INTERIM HISTORY/SUBJECTIVE Patient continues to require norepinephrine for blood pressure support.  Discussions among cardiology and cardiothoracic surgery team are ongoing regarding surgery on mitral valve.  On wake up Assessment today, he was able to follow commands with all 4 extremities.   OBJECTIVE  CBC    Component Value Date/Time   WBC 27.8 (H) 03/30/2023 0412   RBC 3.82 (L) 03/30/2023 0412   HGB 9.7 (L) 03/30/2023 0412   HCT 30.8 (L) 03/30/2023 0412   PLT 231 03/30/2023 0412   MCV 80.6 03/30/2023 0412   MCH 25.4 (L) 03/30/2023 0412   MCHC 31.5 03/30/2023 0412   RDW 18.5 (H) 03/30/2023 0412    BMET    Component Value Date/Time   NA 142 03/30/2023 0412   K 4.8 03/30/2023 0412   CL 111 03/30/2023 0412   CO2 23 03/30/2023 0412   GLUCOSE 162 (H) 03/30/2023 0412   BUN 65 (H) 03/30/2023 0412   CREATININE 1.70 (H) 03/30/2023 0412   CREATININE 1.38 (H) 03/17/2023 0851   CALCIUM 8.5 (L) 03/30/2023 0412   GFRNONAA 46 (L) 03/30/2023 0412   GFRNONAA 60 (L) 03/17/2023 0851    IMAGING past 24  hours DG CHEST PORT 1 VIEW  Result Date: 03/29/2023 CLINICAL DATA:  Check endotracheal tube placement EXAM: PORTABLE CHEST 1 VIEW COMPARISON:  Film from earlier in the same day. FINDINGS: Endotracheal tube is again noted approximately 6.5 cm above the carina. This is roughly stable when compare with the prior exam. Gastric catheter extends into the stomach. Right jugular catheter is again seen. Persistent left-sided airspace opacity is noted. Slight increased airspace opacity on the right is noted which may represent edema given its abrupt onset. No bony abnormality is seen. IMPRESSION: Tubes and lines stable in appearance. Slight increase in airspace opacity on the right while relatively stable on the left. Electronically Signed   By: Alcide Clever M.D.   On: 03/29/2023 21:32   DG CHEST PORT 1 VIEW  Result Date: 03/29/2023 CLINICAL DATA:  Check endotracheal tube placement EXAM: PORTABLE CHEST 1 VIEW COMPARISON:  Film from earlier in the same day. FINDINGS: Endotracheal tube is noted in satisfactory position. Gastric catheter extends into the stomach. Right jugular central line is seen in the mid superior vena cava. No pneumothorax is noted. Persistent and slightly increased airspace opacity is noted throughout the left lung. No other focal infiltrate is seen IMPRESSION: Increasing left lung opacity. Tubes and lines as described. Electronically Signed   By: Alcide Clever M.D.   On: 03/29/2023 21:31   CT CHEST WO CONTRAST  Result Date: 03/29/2023 CLINICAL DATA:  Endocarditis and sepsis.  Right PCA occipital infarct documented on CT and MRI with right frontal convexity subarachnoid hemorrhage. EXAM: CT CHEST WITHOUT CONTRAST TECHNIQUE: Multidetector CT imaging of the chest was performed following the standard protocol without IV contrast. RADIATION DOSE REDUCTION: This exam was performed according to the departmental dose-optimization program which includes automated exposure control, adjustment of the mA  and/or kV according to patient size and/or use of iterative reconstruction technique. COMPARISON:  Portable chest films today, yesterday and 03/27/2023, chest abdomen and pelvis CT without contrast 03/27/2023, CTA chest 01/28/2023. FINDINGS: Cardiovascular: There is a prominent pulmonary trunk again noted measuring 3.2 cm. Mild prominence of the central pulmonary veins also noted. Small pericardial effusion. The heart is moderately enlarged with panchamber involvement. There is scattered calcification in the mid to distal LAD coronary artery with no other visible coronary calcifications. There are mild calcific plaques in the descending thoracic aorta with tortuosity. No visible calcification in the great vessels. There is aortic root dilatation at the sinuses of Valsalva measuring 5 cm, with 4.2 cm caliber of the sinotubular junction, 3.5 cm caliber of the mid ascending segment, 3.5 cm aortic arch diameter. Measurements are unchanged. Right IJ line terminates in the upper to mid SVC. Mediastinum/Nodes: No enlarged mediastinal or axillary lymph nodes. Thyroid gland, trachea, and esophagus demonstrate no significant findings. ETT is in place with the tip 6 cm from the carina at T2-3, NGT passes well into the stomach with the tip not included in the exam but directed towards the distal antrum of stomach. Lungs/Pleura: Small symmetric layering pleural effusions, new from 03/27/2023. Central airways are patent. There are scattered calcified granulomas. There is mild interlobular septal thickening in the lung apices and bases consistent with mild interstitial edema. There is dense consolidation posteriorly in the lower lobes alongside the effusions consistent with pneumonic consolidation and/or edema, with additional patchy consolidation new in the interval in the left upper lobe, greater posteriorly, and patchy ground-glass disease also new in the posterior right upper lobe. Only the right middle lobe and the anterior  aspect of the right upper lobe remain clear. Upper Abdomen: Mild splenomegaly, 14.1 cm AP, improved was previously 16 cm AP. There is hepatic steatosis. No acute upper abdominal findings. Musculoskeletal: Multilevel degenerative disc changes in the thoracic spine with spondylosis and mild kyphosis. No chest wall mass or suspicious bone lesions. IMPRESSION: 1. Cardiomegaly with panchamber involvement, small pericardial effusion, and mild interstitial edema. 2. Small layering pleural effusions with dense consolidation posteriorly in the lower lobes consistent with pneumonic consolidation and/or edema. 3. Additional patchy consolidation in the left upper lobe and ground-glass disease in the posterior right upper lobe, which could be due to edema or pneumonia. 4. Aortic atherosclerosis with 5 cm aortic root dilatation at the sinuses of Valsalva, 4.2 cm sinotubular junction, 3.5 cm mid ascending segment, 3.5 cm aortic arch. Measurements are unchanged. Recommend annual imaging followup by CTA or MRA. This recommendation follows 2010 ACCF/AHA/AATS/ACR/ASA/SCA/SCAI/SIR/STS/SVM Guidelines for the Diagnosis and Management of Patients with Thoracic Aortic Disease. Circulation. 2010; 121: W098-J191. Aortic aneurysm NOS (ICD10-I71.9). 5. Prominent pulmonary trunk measuring 3.2 cm, unchanged. 6. Hepatic steatosis. 7. Mild splenomegaly, improved from the prior study. 8. ETT and NGT as above. Right IJ line terminates in the upper to mid SVC. Aortic Atherosclerosis (ICD10-I70.0). Electronically Signed   By: Almira Bar M.D.   On: 03/29/2023 20:47   MR BRAIN WO CONTRAST  Result Date: 03/29/2023 CLINICAL DATA:  Altered mental status, nontraumatic (Ped 0-17y). EXAM: MRI HEAD WITHOUT CONTRAST TECHNIQUE:  Multiplanar, multiecho pulse sequences of the brain and surrounding structures were obtained without intravenous contrast. COMPARISON:  Same day CT head. FINDINGS: Brain: Confluent acute right PCA territory infarct in the right  occipital lobe. Additional smaller acute infarcts in the right parietal lobe and bilateral cerebellum. A few punctate acute infarcts in the left parietal lobe. Associated edema without substantial mass effect. FLAIR hyperintensity along the right frontal lobe with susceptibility artifact, compatible with subarachnoid hemorrhage seen on same day CT head. No hydrocephalus or mass lesion. No midline shift. Vascular: Major arterial flow voids are maintained skull base. Skull and upper cervical spine: Normal marrow signal. Sinuses/Orbits: Mostly clear sinuses.  No acute orbital findings. Other: Right greater than left mastoid effusions. IMPRESSION: 1. Confluent right PCA territory occipital infarct. 2. Additional smaller acute infarcts in bilateral parietal lobes and the cerebellum. 3. Small volume of acute subarachnoid hemorrhage along the right frontal convexity, likely similar when comparing across modalities to same day CT head. 4. Given reported infective endocarditis the above findings could be secondary to septic emboli. If the patient does not improve, recommend low threshold for repeat MRI with contrast. Electronically Signed   By: Feliberto Harts M.D.   On: 03/29/2023 19:37   VAS US CAROTID  Result Date: 03/29/2023 Carotid Arterial Duplex Study Patient Name:  LEELYNN OTERO Sitzman  Date of Exam:   03/29/2023 Medical Rec #: [id number]       Accession #:    [id number] Date of Birth: 1966/05/25       Patient Gender: M Patient Age:   34 years Exam Location:  [facility] Procedure:      VAS US CAROTID Referring Phys: Eugenio Hoes --------------------------------------------------------------------------------  Indications:       Pre-op (AVR/MVR). Risk Factors:      Hypertension, hyperlipidemia, no history of smoking, coronary                    artery disease. Comparison Study:  No previous exams Performing Technologist: Jody Hill RVT, RDMS  Examination Guidelines: A complete evaluation includes B-mode imaging,  spectral Doppler, color Doppler, and power Doppler as needed of all accessible portions of each vessel. Bilateral testing is considered an integral part of a complete examination. Limited examinations for reoccurring indications may be performed as noted.  Right Carotid Findings: +----------+--------+--------+--------+------------------+------------------+           PSV cm/sEDV cm/sStenosisPlaque DescriptionComments           +----------+--------+--------+--------+------------------+------------------+ CCA Prox                                            Not visualized     +----------+--------+--------+--------+------------------+------------------+ CCA Distal81      0                                 intimal thickening +----------+--------+--------+--------+------------------+------------------+ ICA Prox  52      15                                                   +----------+--------+--------+--------+------------------+------------------+ ICA Distal  Not visualized     +----------+--------+--------+--------+------------------+------------------+ ECA       122     0                                                    +----------+--------+--------+--------+------------------+------------------+ +----------+--------+-------+----------------+-------------------+           PSV cm/sEDV cmsDescribe        Arm Pressure (mmHG) +----------+--------+-------+----------------+-------------------+ [id number]             Multiphasic, WNL                    +----------+--------+-------+----------------+-------------------+ +---------+--------+--+--------+-+---------+ VertebralPSV cm/s46EDV cm/s9Antegrade +---------+--------+--+--------+-+---------+  Left Carotid Findings: +----------+--------+--------+--------+------------------+------------------+           PSV cm/sEDV cm/sStenosisPlaque DescriptionComments            +----------+--------+--------+--------+------------------+------------------+ CCA Prox  116     0                                                    +----------+--------+--------+--------+------------------+------------------+ CCA Distal117     0                                 intimal thickening +----------+--------+--------+--------+------------------+------------------+ ICA Prox  52      14                                                   +----------+--------+--------+--------+------------------+------------------+ ICA Distal68      14                                                   +----------+--------+--------+--------+------------------+------------------+ ECA       122     0                                                    +----------+--------+--------+--------+------------------+------------------+ +----------+--------+--------+----------------+-------------------+           PSV cm/sEDV cm/sDescribe        Arm Pressure (mmHG) +----------+--------+--------+----------------+-------------------+ [id number]             Multiphasic, WNL                    +----------+--------+--------+----------------+-------------------+ +---------+--------+--+--------+-+---------+ VertebralPSV cm/s46EDV cm/s8Antegrade +---------+--------+--+--------+-+---------+   Summary: Right Carotid: Limited exam due to line placement, the extracranial vessels that                were visualized are near-normal with only minimal wall thickening                or plaque. Left Carotid: The extracranial vessels were near-normal with only minimal wall  thickening or plaque. Vertebrals:  Bilateral vertebral arteries demonstrate antegrade flow. Subclavians: Normal flow hemodynamics were seen in bilateral subclavian              arteries. *See table(s) above for measurements and observations.  Electronically signed by Coral Else MD on 03/29/2023 at 6:47:05 PM.    Final     CT HEAD WO CONTRAST ( )  Result Date: 03/29/2023 CLINICAL DATA:  Mental status change.  Infective endocarditis. EXAM: CT HEAD WITHOUT CONTRAST TECHNIQUE: Contiguous axial images were obtained from the base of the skull through the vertex without intravenous contrast. RADIATION DOSE REDUCTION: This exam was performed according to the departmental dose-optimization program which includes automated exposure control, adjustment of the mA and/or kV according to patient size and/or use of iterative reconstruction technique. COMPARISON:  None available FINDINGS: Brain: A subacute medial right occipital lobe nonhemorrhagic infarct is present. No other acute cortical infarct is present. Focal extra-axial hyperdensity is present over the posterior right frontal convexity. Subarachnoid blood is present subjacent to this area. No other focal extracranial hemorrhage is present. No acute parenchymal hemorrhage is present. No significant white matter lesions are present. Deep brain nuclei are within normal limits. The ventricles are of normal size. The brainstem and cerebellum are within normal limits. midline structures are within normal limits. Vascular: No hyperdense vessel or unexpected calcification. Skull: Calvarium is intact. No focal lytic or blastic lesions are present. No significant extracranial soft tissue lesion is present. Sinuses/Orbits: Right greater than left mastoid effusions are present. Endotracheal tube and orogastric tube are in place. Paranasal sinuses are otherwise clear. IMPRESSION: 1. Subacute medial right occipital lobe nonhemorrhagic infarct. 2. Focal extra-axial hyperdensity over the posterior right frontal convexity likely represents a small subdural hematoma. 3. Subarachnoid blood subjacent to this area. 4. Right greater than left mastoid effusions. 5. Endotracheal tube and orogastric tube in place. In the setting of infective endocarditis, the infarct and possibly hemorrhage could be related  to septic emboli. MRI of the brain without and with contrast may be useful for further evaluation. These results were called by telephone at the time of interpretation on 03/29/2023 at 4:55 pm to provider Audie Box , who verbally acknowledged these results. Electronically Signed   By: Marin Roberts M.D.   On: 03/29/2023 16:55    Vitals:   03/30/23 1000 03/30/23 1100 03/30/23 1132 03/30/23 1200  BP: 114/60 121/60  113/61  Pulse: 64 68 69 69  Resp: (!) 22 (!) 22  (!) 22  Temp: 99.3 F (37.4 C) 98.6 F (37 C)  98.8 F (37.1 C)  TempSrc:      SpO2: 99% 100% 99% 100%  Weight:         PHYSICAL EXAM General: Intubated, ill-appearing patient in no acute distress CV: Regular rate and rhythm on monitor Respiratory: Respirations synchronous with ventilator   NEURO (sedation with fentanyl and Precedex paused): Pupils small and sluggishly reactive, midline position.  Does not focus or track examiner.  Will nod in response to voice and follows commands with all 4 extremities.  Sensation appears intact to light touch.   ASSESSMENT/PLAN  Acute Ischemic Infarct:  embolic shower with largest at right PCA infarct and small right frontal SAH, etiology: Cardioembolic in the setting of endocarditis CT head subacute medial right occipital lobe nonhemorrhagic infarct with focal extra-axial hyperdensity over posterior right frontal convexity likely representing small subdural hematoma with subarachnoid blood subadjacent to this area MRI confluent right PCA territory infarct, additional similar acute infarcts in  bilateral parietal lobes in the cerebellum, similar small volume acute subarachnoid hemorrhage along right frontal convexity MRA right P4 occlusion, no overt mycotic aneurysm seen Carotid Doppler unremarkable TEE: 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, aortic root aneurysm, 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  History of hypertension, now hypotensive Home meds: Atenolol 12.5 mg daily On the low end, requiring Levophed Norepinephrine as needed for blood pressure support BP goal normotensive  Hyperlipidemia Home meds: Atorvastatin 80 mg, resumed in hospital LDL 76, goal < 70 Add ezetimibe 10 mg daily Continue home lipitor 80 Continue statin at discharge  Respiratory failure Patient was intubated due to respiratory failure in the setting of sepsis Ventilator management per CCM Wean ventilatory support as able  Other Stroke Risk Factors Obesity, Body mass index is 34.38 kg/m., BMI >/= 30 associated with increased stroke risk, recommend weight loss, diet and exercise as appropriate  Coronary artery disease  Other Active Problems Dysphagia on TF @ 20 AKI, Cre 2.70->1.70  Hospital day # 2  Patient seen by NP and then by MD, MD to edit note as needed Cortney E Ernestina Columbia , MSN, AGACNP-BC [facility] See Amion for schedule and pager information 03/30/2023 1:55 PM  ATTENDING NOTE: I reviewed above note and agree with the assessment and plan. Pt was seen and examined.   RN  at the bedside. Pt still intubated on sedation. Per RN, once sedation off, he was able to briefly follow some commands. Currently sedated on vent, not arousable. Eyes closed, not following commands. With forced eye opening, eyes in mid position, not blinking to visual threat bilaterally, doll's eyes absent, not tracking. Corneal reflex weakness present, gag and cough present. Breathing over the vent.  Facial symmetry not able to test due to ET tube.  Tongue protrusion not cooperative. On pain stimulation, no movement in all extremities. Sensation, coordination and gait not tested.  Pt stroke is certainly septic emboli coming from endocarditis. 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 OK to continue ASA but would recommend to hold cardio surgery until endocarditis better controlled such as blood culture negative, fever and leukocytosis improving.   For detailed assessment and plan, please refer to above/below as I have made changes wherever appropriate.   Marvel Plan, MD PhD Stroke Neurology 03/30/2023 10:59 PM  This patient is critically ill due to endocarditis, embolic strokes, SAH, respiratory failure, fever, leukocytosis and AKI and at significant risk of neurological worsening, death form sepsis, septic shock, heart failure, recurrent stroke, brain hemorrhage. This patient's care requires constant monitoring of vital signs, hemodynamics, respiratory and cardiac monitoring, review of multiple databases, neurological assessment, discussion with family, other specialists and medical decision making of high complexity. I spent 40 minutes of  neurocritical care time in the care of this patient.    To contact Stroke Continuity provider, please refer to [email or web address]. After hours, contact General Neurology

## 2023-03-30 NOTE — Progress Notes (Signed)
[facility]  Date of Admission:  03/28/2023     Total days of antibiotics 3         ASSESSMENT:  Dale Mills blood cultures from from 03/29/23 are without growth. CVTS has tentative plan for valve replacement surgery pending Neurology prognostic impression in the setting of right PCA territory occipital infarct and smaller acute infarcts in the bilateral parietal lobes and cerebellum. Plan of care discussed with daughters for continued antibiotics with ampicillin and ceftriaxone and need for work up of left hip pending CVA and valve replacements which certainly take precedent. Continue to monitor cultures for clearance of bacteremia. Will recheck blood cultures to ensure clearance prior to surgery in the event the current cultures should turn positive. Remaining medical and supportive care per CCM.   PLAN:  Continue current dose of ampicillin and ceftriaxone.  Valve replacement surgery per CVTS when/if appropriate.  Monitor blood cultures for clearance of bacteremia and recheck additional set today.  Remaining medical and supportive care per CCM.    Principal Problem:   Endocarditis Active Problems:   Enterococcal bacteremia   Acute respiratory failure (HCC)   Septic shock (HCC)   Protein-calorie malnutrition, severe    aspirin  324 mg Per Tube Daily   atorvastatin  80 mg Per Tube Daily   Chlorhexidine Gluconate Cloth  6 each Topical Daily   docusate  100 mg Per Tube BID   famotidine  20 mg Per Tube BID   feeding supplement (PROSource TF20)  60 mL Per Tube QID   furosemide  60 mg Intravenous BID   heparin  5,000 Units Subcutaneous Q8H   insulin aspart  0-9 Units Subcutaneous Q4H   mouth rinse  15 mL Mouth Rinse Q2H   polyethylene glycol  17 g Per Tube Daily   thiamine  100 mg Per Tube Daily    SUBJECTIVE:  Fever curve appears to be improving with worsening leukocytosis. Daughters at bedside.   No Known Allergies   Review of  Systems: Review of Systems  Unable to perform ROS: Intubated      OBJECTIVE: Vitals:   03/30/23 1000 03/30/23 1100 03/30/23 1132 03/30/23 1200  BP: 114/60 121/60  113/61  Pulse: 64 68 69 69  Resp: (!) 22 (!) 22  (!) 22  Temp: 99.3 F (37.4 C) 98.6 F (37 C)  98.8 F (37.1 C)  TempSrc:      SpO2: 99% 100% 99% 100%  Weight:       Body mass index is 34.38 kg/m.  Physical Exam Constitutional:      General: He is not in acute distress.    Appearance: He is well-developed. He is ill-appearing.     Interventions: He is intubated.  Cardiovascular:     Rate and Rhythm: Normal rate and regular rhythm.     Heart sounds: Murmur heard.     Comments: Right internal jugular central line present.  Pulmonary:     Effort: Pulmonary effort is normal. He is intubated.     Breath sounds: Normal breath sounds.  Skin:    General: Skin is warm and dry.     Lab Results Lab Results  Component Value Date   WBC 27.8 (H) 03/30/2023   HGB 9.7 (L) 03/30/2023   HCT 30.8 (L) 03/30/2023   MCV 80.6 03/30/2023   PLT 231 03/30/2023    Lab Results  Component Value Date   CREATININE 1.70 (H) 03/30/2023   BUN 65 (H) 03/30/2023  NA 142 03/30/2023   K 4.8 03/30/2023   CL 111 03/30/2023   CO2 23 03/30/2023    Lab Results  Component Value Date   ALT 29 03/29/2023   AST 49 (H) 03/29/2023   ALKPHOS 104 03/29/2023   BILITOT 2.2 (H) 03/29/2023     Microbiology: Recent Results (from the past 240 hour(s))  MRSA Next Gen by PCR, Nasal     Status: None   Collection Time: 03/28/23  5:38 PM   Specimen: Nasal Mucosa; Nasal Swab  Result Value Ref Range Status   MRSA by PCR Next Gen NOT DETECTED NOT DETECTED Final    Comment: (NOTE) The GeneXpert MRSA Assay (FDA approved for NASAL specimens only), is one component of a comprehensive MRSA colonization surveillance program. It is not intended to diagnose MRSA infection nor to guide or monitor treatment for MRSA infections. Test performance is  not FDA approved in patients less than 32 years old. Performed at [facility], [address] [place], [place] [address]   Culture, blood (Routine X 2) w Reflex to ID Panel     Status: None (Preliminary result)   Collection Time: 03/29/23  3:01 AM   Specimen: BLOOD RIGHT HAND  Result Value Ref Range Status   Specimen Description BLOOD RIGHT HAND  Final   Special Requests   Final    BOTTLES DRAWN AEROBIC AND ANAEROBIC Blood Culture adequate volume   Culture   Final    NO GROWTH 1 DAY Performed at [facility], [address], [place], [place] [address]    Report Status PENDING  Incomplete  Culture, blood (Routine X 2) w Reflex to ID Panel     Status: None (Preliminary result)   Collection Time: 03/29/23  3:04 AM   Specimen: BLOOD RIGHT ARM  Result Value Ref Range Status   Specimen Description BLOOD RIGHT ARM  Final   Special Requests   Final    BOTTLES DRAWN AEROBIC AND ANAEROBIC Blood Culture adequate volume   Culture   Final    NO GROWTH 1 DAY Performed at [facility], [address] [place], [place] [address]    Report Status PENDING  Incomplete     Marcos Eke, NP [facility]  03/30/2023  1:26 PM

## 2023-03-30 NOTE — Progress Notes (Signed)
Patient Name: Dale Mills Date of Encounter: 03/30/2023 [facility] Cardiologist: Gypsy Balsam, MD   Interval Summary  .    Patient remains critically ill in the ICU bed 2H 20. Family at bedside. Reportedly could follow commands today when sedation weaned but was agitated.  Vital Signs .    Vitals:   03/30/23 0630 03/30/23 0645 03/30/23 0700 03/30/23 0805  BP: 114/64 (!) 116/57 114/61 (!) 116/59  Pulse: 64 65 65   Resp: (!) 22 (!) 22 (!) 22   Temp: 99.3 F (37.4 C) 99.3 F (37.4 C) 99.3 F (37.4 C)   TempSrc:      SpO2: 100% 100% 99%   Weight:        Intake/Output Summary (Last 24 hours) at 03/30/2023 1030 Last data filed at 03/30/2023 0700 Gross per 24 hour  Intake 1940.57 ml  Output 1610 ml  Net 330.57 ml      03/30/2023    3:49 AM 03/29/2023    1:15 AM 03/17/2023    9:15 AM  Last 3 Weights  Weight (lbs) 246 lb 7.6 oz 243 lb 6.2 oz 249 lb 3.2 oz  Weight (kg) 111.8 kg 110.4 kg 113.036 kg      Telemetry/ECG    SR - Personally Reviewed  Physical Exam .   GEN: Intubated and sedated Neck: No JVD Cardiac: 4/6 holosystolic murmur across precordium, 2/6 diastolic murmur best at left sternal border Respiratory: Ventilator breath sounds GI: Soft, nontender, non-distended  MS: No edema, warm, 4/4 DP pulses  Assessment & Plan .     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  In addition he has aortic root dilation measuring approximately 53 mm with resultant poor coaptation of the aortic valve cusps, and probable infective change of the aortic valve leaflets, with a small vegetation seen on the ventricular aspect of that aortic valve along the aorto mitral continuity.  Very complex  situation, concern for feasibility of repair given the degree of mitral valve endocarditis and likely subacute presentation with windsock deformity and torrential MR. -Patient continues on sedation which has been transition to Precedex, blood pressure supported with norepinephrine. No increasing requirements per nursing. Trial of weaning sedation today per PCCM. - ID consulted but I do not see formal recommendations, abx per PCCM. - TCTS has seen, recommended head imaging which is positive for stroke, awaiting further decisions from TCTS.   Discussed findings with daughters in room. Remains complex.      CRITICAL CARE Performed by: Weston Brass, MD   Total critical care time: 35 minutes   Critical care time was exclusive of separately billable procedures and treating other patients.   Critical care was necessary to treat or prevent imminent or life-threatening deterioration.   Critical care was time spent personally by me (independent of APPs or residents) on the following activities: development of treatment plan with patient and/or surrogate as well as nursing, discussions with consultants, evaluation of patient's response to treatment, examination of patient, obtaining history from patient or surrogate, ordering and performing treatments and interventions, ordering and review of laboratory studies, ordering and review of radiographic studies, pulse oximetry and re-evaluation of patient's condition.  For questions or updates, please contact  [facility] Please consult [email or web address] for  contact info under        Signed, Parke Poisson, MD

## 2023-03-30 NOTE — Plan of Care (Signed)

## 2023-03-30 NOTE — Progress Notes (Signed)
   03/30/23 1300  Spiritual Encounters  Type of Visit Initial  Care provided to: Family  Referral source Family  Reason for visit Advance directives  OnCall Visit No   Ch responded to request for AD. Pt's daughters were at bedside. Ch assisted family with AD education. Pt is intubated so we are not able to complete AD but Ch left a copy of the form with Family. They will contact Ch when they are able to complete the form. No follow-up needed at this time.

## 2023-03-30 NOTE — Progress Notes (Signed)
Pt was transported to MRI and back to 2H20 without complications.

## 2023-03-30 NOTE — Progress Notes (Addendum)
eLink Physician-Brief Progress Note Patient Name: WEIKKO LANTZY DOB: [birth year] MRN: [id number]   Date of Service  03/30/2023  HPI/Events of Note  Patient attempted to pull out endotracheal tube.    eICU Interventions  Tube repositioned.  Chest x-ray done, will review.  Add as needed midazolam to his regimen to keep him calm and sedated.  Soft wrist restraints also ordered.     Intervention Category Evaluation Type: Other  Carilyn Goodpasture 03/30/2023, 10:31 PM

## 2023-03-30 NOTE — H&P (View-Only) (Signed)
[address]       [place] [address]             [phone]         Procedure(s) (LRB): AORTIC VALVE REPLACEMENT (AVR) (N/A) MITRAL VALVE (MV) REPLACEMENT (N/A) ASCENDING AORTIC ROOT REPLACEMENT (N/A) TRANSESOPHAGEAL ECHOCARDIOGRAM (N/A)   Total Length of Stay:  LOS: 2 days    SUBJECTIVE: Has become more unresponsive per nursing overnight Vitals:   03/30/23 0630 03/30/23 0645  BP: 114/64 (!) 116/57  Pulse: 64 65  Resp: (!) 22 (!) 22  Temp: 99.3 F (37.4 C) 99.3 F (37.4 C)  SpO2: 100% 100%    Intake/Output      09/18 0701 09/19 0700 09/19 0701 09/20 0700   I.V. (mL/kg) 1200.2 (10.7)    NG/GT 348.3    IV Piggyback 639.5    Total Intake(mL/kg) 2188 (19.6)    Urine (mL/kg/hr) 1885 (0.7)    Emesis/NG output     Total Output 1885    Net +303             ampicillin (OMNIPEN) IV Stopped (03/30/23 0531)   cefTRIAXone (ROCEPHIN)  IV Stopped (03/29/23 2245)   dexmedetomidine (PRECEDEX) IV infusion 0.8 mcg/kg/hr (03/30/23 0600)   feeding supplement (VITAL 1.5 CAL) 20 mL/hr at 03/30/23 0600   fentaNYL infusion INTRAVENOUS 125 mcg/hr (03/30/23 0600)   norepinephrine (LEVOPHED) Adult infusion 4 mcg/min (03/30/23 0622)    CBC    Component Value Date/Time   WBC 27.8 (H) 03/30/2023 0412   RBC 3.82 (L) 03/30/2023 0412   HGB 9.7 (L) 03/30/2023 0412   HCT 30.8 (L) 03/30/2023 0412   PLT 231 03/30/2023 0412   MCV 80.6 03/30/2023 0412   MCH 25.4 (L) 03/30/2023 0412   MCHC 31.5 03/30/2023 0412   RDW 18.5 (H) 03/30/2023 0412   CMP     Component Value Date/Time   NA 142 03/30/2023 0412   K 4.8 03/30/2023 0412   CL 111 03/30/2023 0412   CO2 23 03/30/2023 0412   GLUCOSE 162 (H) 03/30/2023 0412   BUN 65 (H) 03/30/2023 0412   CREATININE 1.70 (H) 03/30/2023 0412   CREATININE 1.38 (H) 03/17/2023 0851   CALCIUM 8.5 (L) 03/30/2023 0412   PROT 5.7 (L) 03/29/2023 0304   PROT 6.4 11/13/2020 1330   ALBUMIN 2.0 (L) 03/29/2023 0304   ALBUMIN 4.0  11/13/2020 1330   AST 49 (H) 03/29/2023 0304   AST 23 03/17/2023 0851   ALT 29 03/29/2023 0304   ALT 27 03/17/2023 0851   ALKPHOS 104 03/29/2023 0304   BILITOT 2.2 (H) 03/29/2023 0304   BILITOT 1.0 03/17/2023 0851   GFRNONAA 46 (L) 03/30/2023 0412   GFRNONAA 60 (L) 03/17/2023 0851   ABG    Component Value Date/Time   PHART 7.357 03/28/2023 1629   PCO2ART 39.3 03/28/2023 1629   PO2ART 83 03/28/2023 1629   HCO3 22.2 03/28/2023 1629   TCO2 23 03/28/2023 1629   ACIDBASEDEF 3.0 (H) 03/28/2023 1629   O2SAT 96 03/28/2023 1629   CBG (last 3)  Recent Labs    03/29/23 2001 03/29/23 2345 03/30/23 0325  GLUCAP 153* 164* 173*     ASSESSMENT: Aortic and mitral endocarditis Has remained stable hemodynamically overnight with just levo for support Cr has lowered to 1.7 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 Will have neuro give prognostic impression and will see if can wake up more from sedation to observe for deficits  Eugenio Hoes, MD 03/30/2023

## 2023-03-30 NOTE — Progress Notes (Signed)
PHARMACY NOTE:  ANTIMICROBIAL RENAL DOSAGE ADJUSTMENT  Current antimicrobial regimen includes a mismatch between antimicrobial dosage and estimated renal function.  As per policy approved by the Pharmacy & Therapeutics and Medical Executive Committees, the antimicrobial dosage will be adjusted accordingly.  Current antimicrobial dosage:  Ampicillin 2 gm IV q 6 hours   Indication: Enterococcal endocarditis   Renal Function:  Estimated Creatinine Clearance: 61 mL/min (A) (by C-G formula based on SCr of 1.7 mg/dL (H)). []      On intermittent HD, scheduled: []      On CRRT    Antimicrobial dosage has been changed to:  Ampicillin 2 gm IV Q 4 hours   Additional comments:   Thank you for allowing pharmacy to be a part of this patient's care.  Sharin Mons, PharmD, BCPS, BCIDP Infectious Diseases Clinical Pharmacist Phone: [phone] 03/30/2023 9:06 AM

## 2023-03-31 ENCOUNTER — Other Ambulatory Visit: Payer: Self-pay

## 2023-03-31 ENCOUNTER — Inpatient Hospital Stay (HOSPITAL_COMMUNITY): Payer: Medicaid Other | Admitting: Certified Registered Nurse Anesthetist

## 2023-03-31 ENCOUNTER — Inpatient Hospital Stay (HOSPITAL_COMMUNITY): Payer: Medicaid Other

## 2023-03-31 ENCOUNTER — Encounter (HOSPITAL_COMMUNITY)
Admission: EM | Disposition: A | Discharge status: Rehabilitation Facility | Payer: Self-pay | Source: Other Acute Inpatient Hospital | Attending: Thoracic Surgery (Cardiothoracic Vascular Surgery)

## 2023-03-31 ENCOUNTER — Inpatient Hospital Stay: Payer: Medicaid Other

## 2023-03-31 DIAGNOSIS — I6340 Cerebral infarction due to embolism of unspecified cerebral artery: Secondary | ICD-10-CM

## 2023-03-31 DIAGNOSIS — Z952 Presence of prosthetic heart valve: Secondary | ICD-10-CM

## 2023-03-31 DIAGNOSIS — I10 Essential (primary) hypertension: Secondary | ICD-10-CM

## 2023-03-31 DIAGNOSIS — I25119 Atherosclerotic heart disease of native coronary artery with unspecified angina pectoris: Secondary | ICD-10-CM

## 2023-03-31 DIAGNOSIS — E785 Hyperlipidemia, unspecified: Secondary | ICD-10-CM

## 2023-03-31 DIAGNOSIS — I351 Nonrheumatic aortic (valve) insufficiency: Secondary | ICD-10-CM

## 2023-03-31 DIAGNOSIS — J80 Acute respiratory distress syndrome: Secondary | ICD-10-CM

## 2023-03-31 HISTORY — PX: MITRAL VALVE REPLACEMENT: SHX147

## 2023-03-31 HISTORY — PX: TEE WITHOUT CARDIOVERSION: SHX5443

## 2023-03-31 HISTORY — PX: ASCENDING AORTIC ROOT REPLACEMENT: SHX5729

## 2023-03-31 HISTORY — DX: Presence of prosthetic heart valve: Z95.2

## 2023-03-31 LAB — GLUCOSE, CAPILLARY
Glucose-Capillary: 168 mg/dL — ABNORMAL HIGH (ref 70–99)
Glucose-Capillary: 166 mg/dL — ABNORMAL HIGH (ref 70–99)
Glucose-Capillary: 84 mg/dL (ref 70–99)
Glucose-Capillary: 154 mg/dL — ABNORMAL HIGH (ref 70–99)
Glucose-Capillary: 91 mg/dL (ref 70–99)
Glucose-Capillary: 121 mg/dL — ABNORMAL HIGH (ref 70–99)
Glucose-Capillary: 176 mg/dL — ABNORMAL HIGH (ref 70–99)
Glucose-Capillary: 109 mg/dL — ABNORMAL HIGH (ref 70–99)
Glucose-Capillary: 196 mg/dL — ABNORMAL HIGH (ref 70–99)

## 2023-03-31 LAB — BASIC METABOLIC PANEL
Anion gap: 10 (ref 5–15)
Anion gap: 11 (ref 5–15)
BUN: 63 mg/dL — ABNORMAL HIGH (ref 6–20)
BUN: 56 mg/dL — ABNORMAL HIGH (ref 6–20)
CO2: 27 mmol/L (ref 22–32)
CO2: 23 mmol/L (ref 22–32)
Calcium: 9.3 mg/dL (ref 8.9–10.3)
Calcium: 8.6 mg/dL — ABNORMAL LOW (ref 8.9–10.3)
Chloride: 110 mmol/L (ref 98–111)
Chloride: 110 mmol/L (ref 98–111)
Creatinine, Ser: 1.43 mg/dL — ABNORMAL HIGH (ref 0.61–1.24)
Creatinine, Ser: 1.44 mg/dL — ABNORMAL HIGH (ref 0.61–1.24)
GFR, Estimated: 57 mL/min — ABNORMAL LOW (ref >60)
GFR, Estimated: 57 mL/min — ABNORMAL LOW (ref >60)
Glucose, Bld: 177 mg/dL — ABNORMAL HIGH (ref 70–99)
Glucose, Bld: 218 mg/dL — ABNORMAL HIGH (ref 70–99)
Potassium: 4.3 mmol/L (ref 3.5–5.1)
Potassium: 4.6 mmol/L (ref 3.5–5.1)
Sodium: 147 mmol/L — ABNORMAL HIGH (ref 135–145)
Sodium: 144 mmol/L (ref 135–145)

## 2023-03-31 LAB — POCT I-STAT, CHEM 8
BUN: 51 mg/dL — ABNORMAL HIGH (ref 6–20)
BUN: 58 mg/dL — ABNORMAL HIGH (ref 6–20)
BUN: 54 mg/dL — ABNORMAL HIGH (ref 6–20)
BUN: 55 mg/dL — ABNORMAL HIGH (ref 6–20)
BUN: 52 mg/dL — ABNORMAL HIGH (ref 6–20)
BUN: 53 mg/dL — ABNORMAL HIGH (ref 6–20)
Calcium, Ion: 1.32 mmol/L (ref 1.15–1.40)
Calcium, Ion: 1.33 mmol/L (ref 1.15–1.40)
Calcium, Ion: 1.21 mmol/L (ref 1.15–1.40)
Calcium, Ion: 1.22 mmol/L (ref 1.15–1.40)
Calcium, Ion: 1.21 mmol/L (ref 1.15–1.40)
Calcium, Ion: 1.23 mmol/L (ref 1.15–1.40)
Chloride: 112 mmol/L — ABNORMAL HIGH (ref 98–111)
Chloride: 113 mmol/L — ABNORMAL HIGH (ref 98–111)
Chloride: 110 mmol/L (ref 98–111)
Chloride: 109 mmol/L (ref 98–111)
Chloride: 111 mmol/L (ref 98–111)
Chloride: 111 mmol/L (ref 98–111)
Creatinine, Ser: 1.3 mg/dL — ABNORMAL HIGH (ref 0.61–1.24)
Creatinine, Ser: 1.3 mg/dL — ABNORMAL HIGH (ref 0.61–1.24)
Creatinine, Ser: 1.2 mg/dL (ref 0.61–1.24)
Creatinine, Ser: 1.3 mg/dL — ABNORMAL HIGH (ref 0.61–1.24)
Creatinine, Ser: 1.1 mg/dL (ref 0.61–1.24)
Creatinine, Ser: 1.2 mg/dL (ref 0.61–1.24)
Glucose, Bld: 174 mg/dL — ABNORMAL HIGH (ref 70–99)
Glucose, Bld: 177 mg/dL — ABNORMAL HIGH (ref 70–99)
Glucose, Bld: 190 mg/dL — ABNORMAL HIGH (ref 70–99)
Glucose, Bld: 177 mg/dL — ABNORMAL HIGH (ref 70–99)
Glucose, Bld: 175 mg/dL — ABNORMAL HIGH (ref 70–99)
Glucose, Bld: 181 mg/dL — ABNORMAL HIGH (ref 70–99)
HCT: 29 % — ABNORMAL LOW (ref 39.0–52.0)
HCT: 32 % — ABNORMAL LOW (ref 39.0–52.0)
HCT: 25 % — ABNORMAL LOW (ref 39.0–52.0)
HCT: 24 % — ABNORMAL LOW (ref 39.0–52.0)
HCT: 24 % — ABNORMAL LOW (ref 39.0–52.0)
HCT: 25 % — ABNORMAL LOW (ref 39.0–52.0)
Hemoglobin: 10.9 g/dL — ABNORMAL LOW (ref 13.0–17.0)
Hemoglobin: 9.9 g/dL — ABNORMAL LOW (ref 13.0–17.0)
Hemoglobin: 8.5 g/dL — ABNORMAL LOW (ref 13.0–17.0)
Hemoglobin: 8.2 g/dL — ABNORMAL LOW (ref 13.0–17.0)
Hemoglobin: 8.2 g/dL — ABNORMAL LOW (ref 13.0–17.0)
Hemoglobin: 8.5 g/dL — ABNORMAL LOW (ref 13.0–17.0)
Potassium: 4.2 mmol/L (ref 3.5–5.1)
Potassium: 4.3 mmol/L (ref 3.5–5.1)
Potassium: 4.3 mmol/L (ref 3.5–5.1)
Potassium: 4.7 mmol/L (ref 3.5–5.1)
Potassium: 4.5 mmol/L (ref 3.5–5.1)
Potassium: 5.3 mmol/L — ABNORMAL HIGH (ref 3.5–5.1)
Sodium: 146 mmol/L — ABNORMAL HIGH (ref 135–145)
Sodium: 146 mmol/L — ABNORMAL HIGH (ref 135–145)
Sodium: 146 mmol/L — ABNORMAL HIGH (ref 135–145)
Sodium: 146 mmol/L — ABNORMAL HIGH (ref 135–145)
Sodium: 144 mmol/L (ref 135–145)
Sodium: 146 mmol/L — ABNORMAL HIGH (ref 135–145)
TCO2: 24 mmol/L (ref 22–32)
TCO2: 26 mmol/L (ref 22–32)
TCO2: 28 mmol/L (ref 22–32)
TCO2: 28 mmol/L (ref 22–32)
TCO2: 25 mmol/L (ref 22–32)
TCO2: 27 mmol/L (ref 22–32)

## 2023-03-31 LAB — POCT I-STAT 7, (LYTES, BLD GAS, ICA,H+H)
Acid-Base Excess: 0 mmol/L (ref 0.0–2.0)
Acid-Base Excess: 1 mmol/L (ref 0.0–2.0)
Acid-Base Excess: 3 mmol/L — ABNORMAL HIGH (ref 0.0–2.0)
Acid-base deficit: 1 mmol/L (ref 0.0–2.0)
Acid-base deficit: 2 mmol/L (ref 0.0–2.0)
Acid-base deficit: 2 mmol/L (ref 0.0–2.0)
Acid-base deficit: 3 mmol/L — ABNORMAL HIGH (ref 0.0–2.0)
Acid-base deficit: 1 mmol/L (ref 0.0–2.0)
Acid-base deficit: 1 mmol/L (ref 0.0–2.0)
Bicarbonate: 25.3 mmol/L (ref 20.0–28.0)
Bicarbonate: 26.4 mmol/L (ref 20.0–28.0)
Bicarbonate: 26.4 mmol/L (ref 20.0–28.0)
Bicarbonate: 27.3 mmol/L (ref 20.0–28.0)
Bicarbonate: 24.9 mmol/L (ref 20.0–28.0)
Bicarbonate: 24.9 mmol/L (ref 20.0–28.0)
Bicarbonate: 23 mmol/L (ref 20.0–28.0)
Bicarbonate: 23.9 mmol/L (ref 20.0–28.0)
Bicarbonate: 23.9 mmol/L (ref 20.0–28.0)
Calcium, Ion: 1.34 mmol/L (ref 1.15–1.40)
Calcium, Ion: 1.09 mmol/L — ABNORMAL LOW (ref 1.15–1.40)
Calcium, Ion: 1.17 mmol/L (ref 1.15–1.40)
Calcium, Ion: 1.21 mmol/L (ref 1.15–1.40)
Calcium, Ion: 1.23 mmol/L (ref 1.15–1.40)
Calcium, Ion: 1.37 mmol/L (ref 1.15–1.40)
Calcium, Ion: 1.29 mmol/L (ref 1.15–1.40)
Calcium, Ion: 1.22 mmol/L (ref 1.15–1.40)
Calcium, Ion: 1.24 mmol/L (ref 1.15–1.40)
HCT: 30 % — ABNORMAL LOW (ref 39.0–52.0)
HCT: 23 % — ABNORMAL LOW (ref 39.0–52.0)
HCT: 24 % — ABNORMAL LOW (ref 39.0–52.0)
HCT: 23 % — ABNORMAL LOW (ref 39.0–52.0)
HCT: 25 % — ABNORMAL LOW (ref 39.0–52.0)
HCT: 26 % — ABNORMAL LOW (ref 39.0–52.0)
HCT: 26 % — ABNORMAL LOW (ref 39.0–52.0)
HCT: 24 % — ABNORMAL LOW (ref 39.0–52.0)
HCT: 27 % — ABNORMAL LOW (ref 39.0–52.0)
Hemoglobin: 10.2 g/dL — ABNORMAL LOW (ref 13.0–17.0)
Hemoglobin: 7.8 g/dL — ABNORMAL LOW (ref 13.0–17.0)
Hemoglobin: 8.2 g/dL — ABNORMAL LOW (ref 13.0–17.0)
Hemoglobin: 7.8 g/dL — ABNORMAL LOW (ref 13.0–17.0)
Hemoglobin: 8.5 g/dL — ABNORMAL LOW (ref 13.0–17.0)
Hemoglobin: 8.8 g/dL — ABNORMAL LOW (ref 13.0–17.0)
Hemoglobin: 8.8 g/dL — ABNORMAL LOW (ref 13.0–17.0)
Hemoglobin: 8.2 g/dL — ABNORMAL LOW (ref 13.0–17.0)
Hemoglobin: 9.2 g/dL — ABNORMAL LOW (ref 13.0–17.0)
O2 Saturation: 93 %
O2 Saturation: 100 %
O2 Saturation: 100 %
O2 Saturation: 100 %
O2 Saturation: 94 %
O2 Saturation: 97 %
O2 Saturation: 96 %
O2 Saturation: 100 %
O2 Saturation: 100 %
Patient temperature: 36.3
Patient temperature: 37.1
Patient temperature: 37.3
Potassium: 4.2 mmol/L (ref 3.5–5.1)
Potassium: 4.2 mmol/L (ref 3.5–5.1)
Potassium: 4.3 mmol/L (ref 3.5–5.1)
Potassium: 5 mmol/L (ref 3.5–5.1)
Potassium: 4.5 mmol/L (ref 3.5–5.1)
Potassium: 4.5 mmol/L (ref 3.5–5.1)
Potassium: 4.1 mmol/L (ref 3.5–5.1)
Potassium: 4.7 mmol/L (ref 3.5–5.1)
Potassium: 4.7 mmol/L (ref 3.5–5.1)
Sodium: 147 mmol/L — ABNORMAL HIGH (ref 135–145)
Sodium: 145 mmol/L (ref 135–145)
Sodium: 144 mmol/L (ref 135–145)
Sodium: 145 mmol/L (ref 135–145)
Sodium: 146 mmol/L — ABNORMAL HIGH (ref 135–145)
Sodium: 146 mmol/L — ABNORMAL HIGH (ref 135–145)
Sodium: 147 mmol/L — ABNORMAL HIGH (ref 135–145)
Sodium: 146 mmol/L — ABNORMAL HIGH (ref 135–145)
Sodium: 145 mmol/L (ref 135–145)
TCO2: 27 mmol/L (ref 22–32)
TCO2: 28 mmol/L (ref 22–32)
TCO2: 28 mmol/L (ref 22–32)
TCO2: 29 mmol/L (ref 22–32)
TCO2: 26 mmol/L (ref 22–32)
TCO2: 27 mmol/L (ref 22–32)
TCO2: 24 mmol/L (ref 22–32)
TCO2: 25 mmol/L (ref 22–32)
TCO2: 25 mmol/L (ref 22–32)
pCO2 arterial: 49.1 mmHg — ABNORMAL HIGH (ref 32–48)
pCO2 arterial: 53.4 mmHg — ABNORMAL HIGH (ref 32–48)
pCO2 arterial: 42.4 mmHg (ref 32–48)
pCO2 arterial: 42.5 mmHg (ref 32–48)
pCO2 arterial: 50.9 mmHg — ABNORMAL HIGH (ref 32–48)
pCO2 arterial: 54.5 mmHg — ABNORMAL HIGH (ref 32–48)
pCO2 arterial: 46.5 mmHg (ref 32–48)
pCO2 arterial: 40.9 mmHg (ref 32–48)
pCO2 arterial: 42.5 mmHg (ref 32–48)
pH, Arterial: 7.32 — ABNORMAL LOW (ref 7.35–7.45)
pH, Arterial: 7.301 — ABNORMAL LOW (ref 7.35–7.45)
pH, Arterial: 7.403 (ref 7.35–7.45)
pH, Arterial: 7.416 (ref 7.35–7.45)
pH, Arterial: 7.268 — ABNORMAL LOW (ref 7.35–7.45)
pH, Arterial: 7.298 — ABNORMAL LOW (ref 7.35–7.45)
pH, Arterial: 7.3 — ABNORMAL LOW (ref 7.35–7.45)
pH, Arterial: 7.374 (ref 7.35–7.45)
pH, Arterial: 7.359 (ref 7.35–7.45)
pO2, Arterial: 72 mmHg — ABNORMAL LOW (ref 83–108)
pO2, Arterial: 256 mmHg — ABNORMAL HIGH (ref 83–108)
pO2, Arterial: 357 mmHg — ABNORMAL HIGH (ref 83–108)
pO2, Arterial: 325 mmHg — ABNORMAL HIGH (ref 83–108)
pO2, Arterial: 111 mmHg — ABNORMAL HIGH (ref 83–108)
pO2, Arterial: 79 mmHg — ABNORMAL LOW (ref 83–108)
pO2, Arterial: 87 mmHg (ref 83–108)
pO2, Arterial: 395 mmHg — ABNORMAL HIGH (ref 83–108)
pO2, Arterial: 454 mmHg — ABNORMAL HIGH (ref 83–108)

## 2023-03-31 LAB — HEMOGLOBIN AND HEMATOCRIT, BLOOD
HCT: 26.5 % — ABNORMAL LOW (ref 39.0–52.0)
Hemoglobin: 8.2 g/dL — ABNORMAL LOW (ref 13.0–17.0)

## 2023-03-31 LAB — POCT I-STAT EG7
Acid-Base Excess: 0 mmol/L (ref 0.0–2.0)
Bicarbonate: 26.6 mmol/L (ref 20.0–28.0)
Calcium, Ion: 1.13 mmol/L — ABNORMAL LOW (ref 1.15–1.40)
HCT: 25 % — ABNORMAL LOW (ref 39.0–52.0)
Hemoglobin: 8.5 g/dL — ABNORMAL LOW (ref 13.0–17.0)
O2 Saturation: 68 %
Potassium: 4.2 mmol/L (ref 3.5–5.1)
Sodium: 146 mmol/L — ABNORMAL HIGH (ref 135–145)
TCO2: 28 mmol/L (ref 22–32)
pCO2, Ven: 53.1 mmHg (ref 44–60)
pH, Ven: 7.308 (ref 7.25–7.43)
pO2, Ven: 39 mmHg (ref 32–45)

## 2023-03-31 LAB — CBC
HCT: 33.8 % — ABNORMAL LOW (ref 39.0–52.0)
HCT: 24.6 % — ABNORMAL LOW (ref 39.0–52.0)
HCT: 28.8 % — ABNORMAL LOW (ref 39.0–52.0)
Hemoglobin: 10.3 g/dL — ABNORMAL LOW (ref 13.0–17.0)
Hemoglobin: 7.5 g/dL — ABNORMAL LOW (ref 13.0–17.0)
Hemoglobin: 9 g/dL — ABNORMAL LOW (ref 13.0–17.0)
MCH: 24.6 pg — ABNORMAL LOW (ref 26.0–34.0)
MCH: 25.3 pg — ABNORMAL LOW (ref 26.0–34.0)
MCH: 26.1 pg (ref 26.0–34.0)
MCHC: 30.5 g/dL (ref 30.0–36.0)
MCHC: 30.5 g/dL (ref 30.0–36.0)
MCHC: 31.3 g/dL (ref 30.0–36.0)
MCV: 80.7 fL (ref 80.0–100.0)
MCV: 83.1 fL (ref 80.0–100.0)
MCV: 83.5 fL (ref 80.0–100.0)
Platelets: 242 10*3/uL (ref 150–400)
Platelets: 126 10*3/uL — ABNORMAL LOW (ref 150–400)
Platelets: 170 10*3/uL (ref 150–400)
RBC: 4.19 MIL/uL — ABNORMAL LOW (ref 4.22–5.81)
RBC: 2.96 MIL/uL — ABNORMAL LOW (ref 4.22–5.81)
RBC: 3.45 MIL/uL — ABNORMAL LOW (ref 4.22–5.81)
RDW: 18.4 % — ABNORMAL HIGH (ref 11.5–15.5)
RDW: 17.7 % — ABNORMAL HIGH (ref 11.5–15.5)
RDW: 16.5 % — ABNORMAL HIGH (ref 11.5–15.5)
WBC: 20.7 10*3/uL — ABNORMAL HIGH (ref 4.0–10.5)
WBC: 33.3 10*3/uL — ABNORMAL HIGH (ref 4.0–10.5)
WBC: 35.6 10*3/uL — ABNORMAL HIGH (ref 4.0–10.5)
nRBC: 0.9 % — ABNORMAL HIGH (ref 0.0–0.2)
nRBC: 0.3 % — ABNORMAL HIGH (ref 0.0–0.2)
nRBC: 1.1 % — ABNORMAL HIGH (ref 0.0–0.2)

## 2023-03-31 LAB — LIPID PANEL
Cholesterol: 142 mg/dL (ref 0–200)
HDL: <10 mg/dL — ABNORMAL LOW (ref >40)
Triglycerides: 399 mg/dL — ABNORMAL HIGH (ref <150)
VLDL: 80 mg/dL — ABNORMAL HIGH (ref 0–40)

## 2023-03-31 LAB — MAGNESIUM
Magnesium: 2.4 mg/dL (ref 1.7–2.4)
Magnesium: 3.6 mg/dL — ABNORMAL HIGH (ref 1.7–2.4)

## 2023-03-31 LAB — APTT
aPTT: 32 seconds (ref 24–36)
aPTT: 27 seconds (ref 24–36)

## 2023-03-31 LAB — ECHO INTRAOPERATIVE TEE: Weight: 3954.17 oz

## 2023-03-31 LAB — PHOSPHORUS: Phosphorus: 3.3 mg/dL (ref 2.5–4.6)

## 2023-03-31 LAB — PROTIME-INR
INR: 1.7 — ABNORMAL HIGH (ref 0.8–1.2)
INR: 1.5 — ABNORMAL HIGH (ref 0.8–1.2)
Prothrombin Time: 20 seconds — ABNORMAL HIGH (ref 11.4–15.2)
Prothrombin Time: 18 seconds — ABNORMAL HIGH (ref 11.4–15.2)

## 2023-03-31 LAB — PLATELET COUNT: Platelets: 104 10*3/uL — ABNORMAL LOW (ref 150–400)

## 2023-03-31 LAB — FIBRINOGEN
Fibrinogen: 295 mg/dL (ref 210–475)
Fibrinogen: 289 mg/dL (ref 210–475)

## 2023-03-31 LAB — PREPARE RBC (CROSSMATCH)

## 2023-03-31 SURGERY — REPLACEMENT, MITRAL VALVE
Anesthesia: General | Site: Chest

## 2023-03-31 MED ORDER — PROPOFOL 1000 MG/100ML IV EMUL
INTRAVENOUS | Status: AC
  Filled 2023-03-31: qty 100

## 2023-03-31 MED ORDER — ROCURONIUM BROMIDE 10 MG/ML (PF) SYRINGE
PREFILLED_SYRINGE | INTRAVENOUS | Status: AC
  Filled 2023-03-31: qty 10

## 2023-03-31 MED ORDER — BISACODYL 5 MG PO TBEC
10.0000 mg | DELAYED_RELEASE_TABLET | Freq: Every day | ORAL | Status: DC
  Administered 2023-04-05 – 2023-04-06 (×2): 10 mg via ORAL
  Filled 2023-03-31 (×3): qty 2

## 2023-03-31 MED ORDER — MAGNESIUM SULFATE 4 GM/100ML IV SOLN
4.0000 g | Freq: Once | INTRAVENOUS | Status: AC
  Administered 2023-03-31: 4 g via INTRAVENOUS
  Filled 2023-03-31: qty 100

## 2023-03-31 MED ORDER — TRAMADOL HCL 50 MG PO TABS: 50.0000 mg | ORAL_TABLET | ORAL | Status: DC | PRN

## 2023-03-31 MED ORDER — ASPIRIN 81 MG PO CHEW: 324.0000 mg | CHEWABLE_TABLET | Freq: Once | ORAL | Status: DC

## 2023-03-31 MED ORDER — SODIUM CHLORIDE 0.9 % IV SOLN: INTRAVENOUS | Status: DC

## 2023-03-31 MED ORDER — STROKE: EARLY STAGES OF RECOVERY BOOK
Freq: Once | Status: DC
  Filled 2023-03-31: qty 1

## 2023-03-31 MED ORDER — VANCOMYCIN HCL IN DEXTROSE 1-5 GM/200ML-% IV SOLN
1000.0000 mg | Freq: Once | INTRAVENOUS | Status: AC
  Administered 2023-03-31: 1000 mg via INTRAVENOUS
  Filled 2023-03-31: qty 200

## 2023-03-31 MED ORDER — INSULIN REGULAR(HUMAN) IN NACL 100-0.9 UT/100ML-% IV SOLN
INTRAVENOUS | Status: DC
  Administered 2023-03-31: 6.5 [IU]/h via INTRAVENOUS
  Administered 2023-04-01: 7.5 [IU]/h via INTRAVENOUS
  Filled 2023-03-31: qty 100

## 2023-03-31 MED ORDER — PROTAMINE SULFATE 10 MG/ML IV SOLN
INTRAVENOUS | Status: AC
  Filled 2023-03-31: qty 15

## 2023-03-31 MED ORDER — CALCIUM CHLORIDE 10 % IV SOLN
INTRAVENOUS | Status: DC | PRN
  Administered 2023-03-31: 300 mg via INTRAVENOUS
  Administered 2023-03-31: 200 mg via INTRAVENOUS

## 2023-03-31 MED ORDER — VANCOMYCIN HCL 1000 MG IV SOLR: INTRAVENOUS | Status: DC | PRN

## 2023-03-31 MED ORDER — VASOPRESSIN 20 UNITS/100 ML INFUSION FOR SHOCK: 0.0000 [IU]/min | INTRAVENOUS | Status: DC

## 2023-03-31 MED ORDER — METOPROLOL TARTRATE 5 MG/5ML IV SOLN: 2.5000 mg | INTRAVENOUS | Status: DC | PRN

## 2023-03-31 MED ORDER — NITROGLYCERIN IN D5W 200-5 MCG/ML-% IV SOLN
INTRAVENOUS | Status: DC | PRN
  Administered 2023-03-31 (×3): .02 mg via INTRAVENOUS

## 2023-03-31 MED ORDER — FENTANYL CITRATE (PF) 250 MCG/5ML IJ SOLN
INTRAMUSCULAR | Status: AC
  Filled 2023-03-31: qty 5

## 2023-03-31 MED ORDER — FREE WATER
150.0000 mL | Status: DC
  Administered 2023-03-31: 150 mL

## 2023-03-31 MED ORDER — SODIUM CHLORIDE 0.9% IV SOLUTION: Freq: Once | INTRAVENOUS | Status: AC

## 2023-03-31 MED ORDER — ACETAMINOPHEN 160 MG/5ML PO SOLN
1000.0000 mg | Freq: Four times a day (QID) | ORAL | Status: DC
  Administered 2023-03-31 – 2023-04-01 (×2): 1000 mg
  Filled 2023-03-31 (×2): qty 40.6

## 2023-03-31 MED ORDER — DEXTROSE 50 % IV SOLN: 0.0000 mL | INTRAVENOUS | Status: DC | PRN

## 2023-03-31 MED ORDER — PLASMA-LYTE A IV SOLN: INTRAVENOUS | Status: DC | PRN

## 2023-03-31 MED ORDER — PROTAMINE SULFATE 10 MG/ML IV SOLN
INTRAVENOUS | Status: DC | PRN
Start: 2023-03-31 — End: 2023-03-31
  Administered 2023-03-31: 400 mg via INTRAVENOUS

## 2023-03-31 MED ORDER — ASPIRIN 325 MG PO TBEC: 325.0000 mg | DELAYED_RELEASE_TABLET | Freq: Every day | ORAL | Status: DC

## 2023-03-31 MED ORDER — PROTAMINE SULFATE 10 MG/ML IV SOLN
INTRAVENOUS | Status: AC
  Filled 2023-03-31: qty 25

## 2023-03-31 MED ORDER — CHLORHEXIDINE GLUCONATE 0.12 % MT SOLN
15.0000 mL | OROMUCOSAL | Status: AC
  Administered 2023-03-31: 15 mL via OROMUCOSAL
  Filled 2023-03-31: qty 15

## 2023-03-31 MED ORDER — AMIODARONE IV BOLUS ONLY 150 MG/100ML
INTRAVENOUS | Status: DC | PRN
  Administered 2023-03-31: 150 mg via INTRAVENOUS

## 2023-03-31 MED ORDER — HEPARIN SODIUM (PORCINE) 1000 UNIT/ML IJ SOLN
INTRAMUSCULAR | Status: AC
  Filled 2023-03-31: qty 10

## 2023-03-31 MED ORDER — METHYLENE BLUE (ANTIDOTE) 1 % IV SOLN
INTRAVENOUS | Status: DC | PRN
Start: 2023-03-31 — End: 2023-03-31
  Administered 2023-03-31: 224.2 mg via INTRAVENOUS

## 2023-03-31 MED ORDER — SODIUM CHLORIDE 0.9% FLUSH
3.0000 mL | Freq: Two times a day (BID) | INTRAVENOUS | Status: DC
  Administered 2023-04-01 – 2023-04-09 (×13): 3 mL via INTRAVENOUS

## 2023-03-31 MED ORDER — INSULIN REGULAR(HUMAN) IN NACL 100-0.9 UT/100ML-% IV SOLN: INTRAVENOUS | Status: DC

## 2023-03-31 MED ORDER — PANTOPRAZOLE SODIUM 40 MG PO TBEC: 40.0000 mg | DELAYED_RELEASE_TABLET | Freq: Every day | ORAL | Status: DC

## 2023-03-31 MED ORDER — PROSOURCE TF20 ENFIT COMPATIBL EN LIQD: 60.0000 mL | Freq: Two times a day (BID) | ENTERAL | Status: DC

## 2023-03-31 MED ORDER — POTASSIUM CHLORIDE 10 MEQ/50ML IV SOLN
10.0000 meq | INTRAVENOUS | Status: AC
  Filled 2023-03-31: qty 50

## 2023-03-31 MED ORDER — HEMOSTATIC AGENTS (NO CHARGE) OPTIME
TOPICAL | Status: DC | PRN
Start: 2023-03-31 — End: 2023-03-31
  Administered 2023-03-31: 1 via TOPICAL

## 2023-03-31 MED ORDER — METOCLOPRAMIDE HCL 5 MG/ML IJ SOLN
10.0000 mg | Freq: Four times a day (QID) | INTRAMUSCULAR | Status: AC
  Administered 2023-03-31 – 2023-04-01 (×6): 10 mg via INTRAVENOUS
  Filled 2023-03-31 (×6): qty 2

## 2023-03-31 MED ORDER — ROCURONIUM BROMIDE 10 MG/ML (PF) SYRINGE
PREFILLED_SYRINGE | INTRAVENOUS | Status: DC | PRN
  Administered 2023-03-31: 60 mg via INTRAVENOUS
  Administered 2023-03-31 (×2): 100 mg via INTRAVENOUS
  Administered 2023-03-31: 40 mg via INTRAVENOUS

## 2023-03-31 MED ORDER — DEXMEDETOMIDINE HCL IN NACL 400 MCG/100ML IV SOLN
0.0000 ug/kg/h | INTRAVENOUS | Status: DC
  Administered 2023-03-31: 0.7 ug/kg/h via INTRAVENOUS

## 2023-03-31 MED ORDER — NOREPINEPHRINE 4 MG/250ML-% IV SOLN: 0.0000 ug/min | INTRAVENOUS | Status: DC

## 2023-03-31 MED ORDER — METHYLENE BLUE (ANTIDOTE) 1 % IV SOLN
224.0000 mg | Freq: Once | Status: DC
  Filled 2023-03-31: qty 22.4

## 2023-03-31 MED ORDER — OXYCODONE HCL 5 MG PO TABS
5.0000 mg | ORAL_TABLET | ORAL | Status: DC | PRN
  Administered 2023-04-02 – 2023-04-04 (×4): 5 mg via ORAL
  Administered 2023-04-04: 10 mg via ORAL
  Filled 2023-03-31 (×3): qty 1
  Filled 2023-03-31: qty 2
  Filled 2023-03-31: qty 1

## 2023-03-31 MED ORDER — EZETIMIBE 10 MG PO TABS
10.0000 mg | ORAL_TABLET | Freq: Every day | ORAL | Status: DC
  Administered 2023-04-01: 10 mg
  Filled 2023-03-31: qty 1

## 2023-03-31 MED ORDER — METOPROLOL TARTRATE 12.5 MG HALF TABLET: 12.5000 mg | ORAL_TABLET | Freq: Two times a day (BID) | ORAL | Status: DC

## 2023-03-31 MED ORDER — ALBUMIN HUMAN 5 % IV SOLN
250.0000 mL | INTRAVENOUS | Status: DC | PRN
  Administered 2023-03-31 – 2023-04-01 (×2): 12.5 g via INTRAVENOUS
  Filled 2023-03-31 (×2): qty 250

## 2023-03-31 MED ORDER — ACETAMINOPHEN 500 MG PO TABS: 1000.0000 mg | ORAL_TABLET | Freq: Four times a day (QID) | ORAL | Status: DC

## 2023-03-31 MED ORDER — MORPHINE SULFATE (PF) 2 MG/ML IV SOLN
1.0000 mg | INTRAVENOUS | Status: DC | PRN
  Administered 2023-04-02 (×3): 2 mg via INTRAVENOUS
  Filled 2023-03-31 (×3): qty 1

## 2023-03-31 MED ORDER — ACETAMINOPHEN 160 MG/5ML PO SOLN
650.0000 mg | Freq: Once | ORAL | Status: AC
  Administered 2023-03-31: 650 mg
  Filled 2023-03-31: qty 20.3

## 2023-03-31 MED ORDER — TAMSULOSIN HCL 0.4 MG PO CAPS
0.4000 mg | ORAL_CAPSULE | Freq: Two times a day (BID) | ORAL | Status: DC
  Administered 2023-04-01 – 2023-04-09 (×16): 0.4 mg via ORAL
  Filled 2023-03-31 (×16): qty 1

## 2023-03-31 MED ORDER — METOPROLOL TARTRATE 25 MG/10 ML ORAL SUSPENSION: 12.5000 mg | Freq: Two times a day (BID) | ORAL | Status: DC

## 2023-03-31 MED ORDER — LACTATED RINGERS IV SOLN: INTRAVENOUS | Status: DC | PRN

## 2023-03-31 MED ORDER — ASPIRIN 81 MG PO CHEW
324.0000 mg | CHEWABLE_TABLET | Freq: Every day | ORAL | Status: DC
  Administered 2023-04-01: 324 mg
  Filled 2023-03-31: qty 4

## 2023-03-31 MED ORDER — FENTANYL CITRATE (PF) 250 MCG/5ML IJ SOLN
INTRAMUSCULAR | Status: DC | PRN
  Administered 2023-03-31 (×4): 50 ug via INTRAVENOUS
  Administered 2023-03-31: 100 ug via INTRAVENOUS
  Administered 2023-03-31: 50 ug via INTRAVENOUS

## 2023-03-31 MED ORDER — ROCURONIUM BROMIDE 10 MG/ML (PF) SYRINGE
PREFILLED_SYRINGE | INTRAVENOUS | Status: AC
  Filled 2023-03-31: qty 40

## 2023-03-31 MED ORDER — MIDAZOLAM HCL (PF) 10 MG/2ML IJ SOLN
INTRAMUSCULAR | Status: AC
  Filled 2023-03-31: qty 2

## 2023-03-31 MED ORDER — HEPARIN SODIUM (PORCINE) 1000 UNIT/ML IJ SOLN
INTRAMUSCULAR | Status: DC | PRN
  Administered 2023-03-31: 40000 [IU] via INTRAVENOUS

## 2023-03-31 MED ORDER — BISACODYL 10 MG RE SUPP
10.0000 mg | Freq: Every day | RECTAL | Status: DC
  Administered 2023-04-01: 10 mg via RECTAL
  Filled 2023-03-31: qty 1

## 2023-03-31 MED ORDER — PROPOFOL 1000 MG/100ML IV EMUL
0.0000 ug/kg/min | INTRAVENOUS | Status: DC
  Administered 2023-03-31: 20 ug/kg/min via INTRAVENOUS
  Administered 2023-03-31: 5 ug/kg/min via INTRAVENOUS
  Administered 2023-04-01 (×2): 25 ug/kg/min via INTRAVENOUS
  Filled 2023-03-31 (×3): qty 100

## 2023-03-31 MED ORDER — SODIUM CHLORIDE 0.9 % IV SOLN: 10.0000 mL/h | Freq: Once | INTRAVENOUS | Status: DC

## 2023-03-31 MED ORDER — LACTATED RINGERS IV SOLN: INTRAVENOUS | Status: DC

## 2023-03-31 MED ORDER — MIDAZOLAM HCL (PF) 5 MG/ML IJ SOLN
INTRAMUSCULAR | Status: DC | PRN
  Administered 2023-03-31 (×2): 2 mg via INTRAVENOUS

## 2023-03-31 MED ORDER — ONDANSETRON HCL 4 MG/2ML IJ SOLN
4.0000 mg | Freq: Four times a day (QID) | INTRAMUSCULAR | Status: DC | PRN
  Administered 2023-04-01 – 2023-04-02 (×2): 4 mg via INTRAVENOUS
  Filled 2023-03-31 (×2): qty 2

## 2023-03-31 MED ORDER — HEPARIN SODIUM (PORCINE) 1000 UNIT/ML IJ SOLN
INTRAMUSCULAR | Status: AC
  Filled 2023-03-31: qty 1

## 2023-03-31 MED ORDER — CALCIUM CHLORIDE 10 % IV SOLN
INTRAVENOUS | Status: AC
  Filled 2023-03-31: qty 10

## 2023-03-31 MED ORDER — ALBUMIN HUMAN 5 % IV SOLN: INTRAVENOUS | Status: DC | PRN

## 2023-03-31 MED ORDER — 0.9 % SODIUM CHLORIDE (POUR BTL) OPTIME
TOPICAL | Status: DC | PRN
  Administered 2023-03-31: 5000 mL

## 2023-03-31 MED ORDER — ATORVASTATIN CALCIUM 80 MG PO TABS
80.0000 mg | ORAL_TABLET | Freq: Every day | ORAL | Status: DC
  Administered 2023-04-01: 80 mg
  Filled 2023-03-31: qty 1

## 2023-03-31 MED ORDER — MIDAZOLAM HCL 2 MG/2ML IJ SOLN: 2.0000 mg | INTRAMUSCULAR | Status: DC | PRN

## 2023-03-31 MED ORDER — VASOPRESSIN 20 UNITS/100 ML INFUSION FOR SHOCK
INTRAVENOUS | Status: DC | PRN
Start: 2023-03-31 — End: 2023-03-31
  Administered 2023-03-31: .02 [IU]/h via INTRAVENOUS

## 2023-03-31 MED ORDER — SODIUM CHLORIDE 0.45 % IV SOLN: INTRAVENOUS | Status: DC | PRN

## 2023-03-31 MED ORDER — SODIUM CHLORIDE 0.9% FLUSH: 3.0000 mL | INTRAVENOUS | Status: DC | PRN

## 2023-03-31 MED ORDER — VITAL 1.5 CAL PO LIQD
1000.0000 mL | ORAL | Status: DC
  Administered 2023-03-31: 20 mL

## 2023-03-31 MED ORDER — NITROPRUSSIDE SODIUM-NACL 20-0.9 MG/100ML-% IV SOLN
INTRAVENOUS | Status: DC | PRN
Start: 2023-03-31 — End: 2023-03-31

## 2023-03-31 MED ORDER — PANTOPRAZOLE SODIUM 40 MG IV SOLR
40.0000 mg | Freq: Every day | INTRAVENOUS | Status: AC
  Administered 2023-03-31 – 2023-04-01 (×2): 40 mg via INTRAVENOUS
  Filled 2023-03-31 (×2): qty 10

## 2023-03-31 MED ORDER — SODIUM CHLORIDE 0.9 % IV SOLN: 250.0000 mL | INTRAVENOUS | Status: DC

## 2023-03-31 MED ORDER — DOCUSATE SODIUM 100 MG PO CAPS: 200.0000 mg | ORAL_CAPSULE | Freq: Every day | ORAL | Status: DC

## 2023-03-31 MED ORDER — ONDANSETRON HCL 4 MG/2ML IJ SOLN: INTRAMUSCULAR | Status: DC | PRN

## 2023-03-31 SURGICAL SUPPLY — 88 items
5 PAIRS OF YELLOW SUTURE CLAMP (MISCELLANEOUS) ×2
ADAPTER CARDIO PERF ANTE/RETRO (ADAPTER) ×3 IMPLANT
ADPR PRFSN 84XANTGRD RTRGD (ADAPTER) ×2
ANTIFOG SOL W/FOAM PAD STRL (MISCELLANEOUS) ×2 IMPLANT
BAG DECANTER FOR FLEXI CONT (MISCELLANEOUS) ×3 IMPLANT
BLADE CLIPPER SURG (BLADE) ×3 IMPLANT
BLADE STERNUM SYSTEM 6 (BLADE) ×3 IMPLANT
BOOT SUTURE VASCULAR YLW (MISCELLANEOUS) ×2
CANISTER SUCT 3000ML PPV (MISCELLANEOUS) ×3 IMPLANT
CANN PRFSN 3/8XCNCT ST RT ANG (MISCELLANEOUS) ×2 IMPLANT
CANNULA NON VENT 20FR 12 (CANNULA) ×3 IMPLANT
CANNULA NON VENT 22FR 12 (CANNULA) ×3 IMPLANT
CANNULA PRFSN 3/8XCNCT RT ANG (MISCELLANEOUS) IMPLANT
CANNULA SUMP PERICARDIAL (CANNULA) ×3 IMPLANT
CANNULA VEN MTL TIP RT (MISCELLANEOUS) ×2
CANNULA VRC MALB SNGL STG 36FR (MISCELLANEOUS) IMPLANT
CATH HEART VENT LEFT (CATHETERS) ×3 IMPLANT
CATH RETROPLEGIA CORONARY 14FR (CATHETERS) ×3 IMPLANT
CATH ROBINSON RED A/P 18FR (CATHETERS) ×9 IMPLANT
CATH THOR STR 32F SOFT 20 RADI (CATHETERS) ×6 IMPLANT
CLAMP SUTURE YELLOW 5 PAIRS (MISCELLANEOUS) ×3 IMPLANT
CLIP TI MEDIUM 24 (CLIP) IMPLANT
CNTNR URN SCR LID CUP LEK RST (MISCELLANEOUS) IMPLANT
CONNECTOR 1/2X3/8X1/2 3WAY (MISCELLANEOUS) IMPLANT
CONT SPEC 4OZ STRL OR WHT (MISCELLANEOUS) ×4
COVER CAMERA OR STL DISP (MISCELLANEOUS) IMPLANT
DEVICE SUT CK QUICK LOAD MINI (Prosthesis & Implant Heart) IMPLANT
DRAPE CV SPLIT W-CLR ANES SCRN (DRAPES) ×3 IMPLANT
DRAPE INCISE IOBAN 66X45 STRL (DRAPES) ×3 IMPLANT
DRAPE PERI GROIN 82X75IN TIB (DRAPES) ×3 IMPLANT
DRSG AQUACEL AG ADV 3.5X10 (GAUZE/BANDAGES/DRESSINGS) ×3 IMPLANT
ELECT CAUTERY BLADE 6.4 (BLADE) ×3 IMPLANT
ELECT REM PT RETURN 9FT ADLT (ELECTROSURGICAL) ×4 IMPLANT
ELECTRODE REM PT RTRN 9FT ADLT (ELECTROSURGICAL) ×6 IMPLANT
FELT TEFLON 1X6 (MISCELLANEOUS) ×6 IMPLANT
GAUZE SPONGE 4X4 12PLY STRL (GAUZE/BANDAGES/DRESSINGS) ×6 IMPLANT
GOWN STRL REUS W/ TWL LRG LVL3 (GOWN DISPOSABLE) ×18 IMPLANT
GOWN STRL REUS W/TWL LRG LVL3 (GOWN DISPOSABLE) ×12
HEMOSTAT SURGICEL 2X14 (HEMOSTASIS) IMPLANT
INSERT FOGARTY XLG (MISCELLANEOUS) ×3 IMPLANT
KIT BASIN OR (CUSTOM PROCEDURE TRAY) ×3 IMPLANT
KIT CATH CPB BARTLE (MISCELLANEOUS) ×3 IMPLANT
KIT SUCTION CATH 14FR (SUCTIONS) ×3 IMPLANT
KIT SUT CK MINI COMBO 4X17 (Prosthesis & Implant Heart) IMPLANT
KIT TURNOVER KIT B (KITS) ×3 IMPLANT
LINE VENT (MISCELLANEOUS) IMPLANT
NS IRRIG 1000ML POUR BTL (IV SOLUTION) ×18 IMPLANT
ORGANIZER SUTURE GABBAY-FRATER (MISCELLANEOUS) ×3 IMPLANT
PACK E OPEN HEART (SUTURE) ×3 IMPLANT
PACK OPEN HEART (CUSTOM PROCEDURE TRAY) ×3 IMPLANT
PAD ARMBOARD 7.5X6 YLW CONV (MISCELLANEOUS) ×6 IMPLANT
PAD ELECT DEFIB RADIOL ZOLL (MISCELLANEOUS) ×3 IMPLANT
PEN CAUTERY HI TEMP VTIP 1/2 (MISCELLANEOUS) IMPLANT
PENCIL BUTTON HOLSTER BLD 10FT (ELECTRODE) ×3 IMPLANT
POSITIONER HEAD DONUT 9IN (MISCELLANEOUS) ×3 IMPLANT
SEALANT SURG COSEAL 8ML (VASCULAR PRODUCTS) IMPLANT
SET MPS 3-ND DEL (MISCELLANEOUS) IMPLANT
SOLUTION ANTFG W/FOAM PAD STRL (MISCELLANEOUS) ×3 IMPLANT
SUT EB EXC GRN/WHT 2-0 V-5 (SUTURE) ×6 IMPLANT
SUT ETHIBOND 2 0 SH (SUTURE) IMPLANT
SUT ETHIBOND 3 0 SH 1 (SUTURE) IMPLANT
SUT MNCRL AB 4-0 PS2 18 (SUTURE) ×6 IMPLANT
SUT PROLENE 4 0 RB 1 (SUTURE) ×4
SUT PROLENE 4 0 SH DA (SUTURE) ×9 IMPLANT
SUT PROLENE 4-0 RB1 .5 CRCL 36 (SUTURE) ×6 IMPLANT
SUT PROLENE 5 0 C 1 36 (SUTURE) IMPLANT
SUT PROLENE 5 0 RB 2 (SUTURE) IMPLANT
SUT SILK 2 0 SH (SUTURE) IMPLANT
SUT STEEL 6MS V (SUTURE) ×3 IMPLANT
SUT STEEL SZ 6 DBL 3X14 BALL (SUTURE) ×9 IMPLANT
SUT VIC AB 0 CTX 36 (SUTURE) ×4
SUT VIC AB 0 CTX36XBRD ANTBCTR (SUTURE) ×6 IMPLANT
SUT VIC AB 2-0 CT1 27 (SUTURE) ×4
SUT VIC AB 2-0 CT1 TAPERPNT 27 (SUTURE) ×6 IMPLANT
SYSTEM SAHARA CHEST DRAIN ATS (WOUND CARE) ×3 IMPLANT
TAG SUTURE CLAMP YLW 5PR (MISCELLANEOUS) ×2 IMPLANT
TAPE CLOTH SURG 4X10 WHT LF (GAUZE/BANDAGES/DRESSINGS) IMPLANT
TAPE PAPER 2X10 WHT MICROPORE (GAUZE/BANDAGES/DRESSINGS) IMPLANT
TOWEL GREEN STERILE (TOWEL DISPOSABLE) ×3 IMPLANT
TOWEL GREEN STERILE FF (TOWEL DISPOSABLE) ×3 IMPLANT
TRAY FOLEY SLVR 16FR TEMP STAT (SET/KITS/TRAYS/PACK) ×3 IMPLANT
TUBE CONNECTING 20X1/4 (TUBING) IMPLANT
UNDERPAD 30X36 HEAVY ABSORB (UNDERPADS AND DIAPERS) ×3 IMPLANT
VALVE AORTIC KONECT RESILIA 27 (Valve) IMPLANT
VALVE MITRAL SZ 33 (Prosthesis & Implant Heart) IMPLANT
VENT LEFT HEART 12002 (CATHETERS) ×2 IMPLANT
VRC MALLEABLE SINGLE STG 36FR (MISCELLANEOUS) ×2 IMPLANT
WATER STERILE IRR 1000ML POUR (IV SOLUTION) ×6 IMPLANT

## 2023-03-31 NOTE — Progress Notes (Signed)
Initial Nutrition Assessment  DOCUMENTATION CODES:   Severe malnutrition in context of acute illness/injury  INTERVENTION:   Tube Feeding via OG:  Vital 1.5 at 60 ml/hr with Pro-Source TF20 60 mL BID Increase to 30 ml/hr and titrate by 10 mL q 8 hours until goal rate of 60 ml/hr TF at goal provides 137 g of protein, 2320 kcals and 1094 mL of free water  Add Free water flush of 150 mL q 4 hours; total free water flush of 1994 mL   Thiamine 100 mg daily per tube x total of 5 days  NUTRITION DIAGNOSIS:   Severe Malnutrition related to acute illness (possible chronic component as well) as evidenced by moderate fat depletion, moderate muscle depletion.  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 PMH includes CAD, anxiety, arthritis, dyslipidemia, HTN  9/17 Admitted 9/18 Trickle TF initiated with PS x 4 9/20 OR for MVR and aortic root replacement; advancing TF towards goal, added free water flushes  Pt remains on vent support. OR today for MVR and aortic root replacement Pressors currently off  OG tube in stomach per abd xray Tolerating trickle TF of Vital 1.5 at 20 ml/hr  Abdomen soft, BS hypoactive, +Flatus, no BM  Labs: sodium 146 (H), BUN 53, Creatinine 1.20 (wdl), potassium wdl Meds: colace BID, miralax BID, ss novolog, reglan, thiamine   NUTRITION - FOCUSED PHYSICAL EXAM:  Flowsheet Row Most Recent Value  Orbital Region Moderate depletion  Upper Arm Region Unable to assess  Thoracic and Lumbar Region Unable to assess  Buccal Region Moderate depletion  Temple Region Severe depletion   Clavicle Bone Region Moderate depletion  Clavicle and Acromion Bone Region Moderate depletion  Scapular Bone Region Mild depletion  Dorsal Hand Unable to assess  Patellar Region Mild depletion  Anterior Thigh Region Mild depletion  Posterior Calf Region Mild depletion  Edema (RD Assessment) Mild       Diet Order:   Diet Order             Diet NPO time specified  Diet effective now                   EDUCATION NEEDS:   Education needs have been addressed (discussed nutrition plan with family)  Skin:  Skin Assessment: Reviewed RN Assessment  Last BM:  PTA, abdomen soft, +flatus  Height:   Ht Readings from Last 1 Encounters:  03/17/23 5\' 11"  (1.803 m)    Weight:   Wt Readings from Last 1 Encounters:  03/31/23 112.1 kg    BMI:  Body mass index is 34.47 kg/m.  Estimated Nutritional Needs:   Kcal:  2100-2300 kcals  Protein:  120-150 g  Fluid:  >/= 2L    Romelle Starcher MS, RDN, LDN, CNSC Registered Dietitian 3 Clinical Nutrition RD Pager and On-Call Pager Number Located in [place]

## 2023-03-31 NOTE — Progress Notes (Signed)
NAME:  Dale Mills, MRN:  [id number], DOB:  1966/03/14, LOS: 3 ADMISSION DATE:  03/28/2023, CONSULTATION DATE:  9/17 REFERRING MD:  August Saucer, CHIEF COMPLAINT:  endocarditis    History of Present Illness:  57 year old male patient with history as mentioned below which includes hypertension, coronary artery disease, recurrent urinary tract infections and pyelonephritis, and recent left hip arthroplasty with surgery completed back in April 2024.  Presents to the emergency room with chief complaint of nausea, lightheadedness, generalized weakness, fever and chills.  Presented from work on 9/16 with the above symptoms, initially found to be slightly hypotensive with systolic blood pressure 95 heart rate 92 white blood cell count of 20.4 hemoglobin 10.9 BUN was 39 and serum creatinine 2.6, baseline 0.6.  His BNP was 2880, UA was concerning for possible UTI chest x-ray was clear CT and abdomen was negative for acute findings a right upper quadrant ultrasound was obtained which showed cholelithiasis but no evidence of cholecystitis his heart rate continue to elevate while in the emergency room noted to be in sinus tachycardia heart rate 110s to 120s blood pressure remained in the soft 90s systolic cultures were sent he was administered IV crystalloid and 1 during resuscitation effort required increasing supplemental oxygen with progressive increased work of breathing and follow-up chest x-ray suggesting flash pulmonary edema.  He was unable to tolerate noninvasive ventilation and therefore was intubated.  His Tmax while in the emergency room was 105.8 his blood pressure at its lowest dropped to the 80s systolic.  He was started on dobutamine infusion and broad-spectrum antibiotics have been started as well working diagnosis at time of initial evaluation was sepsis/septic shock felt secondary to urinary tract source complicated by AKI, non-STEMI, and flash pulmonary edema.  He was admitted to the intensive care,  antibiotics were continued as were resuscitation efforts and biotics.  Blood cultures were positive for GPC in pairs and chains with PCR detecting e faecalis.  Transthoracic echocardiogram suggesting endocarditis because of these findings he was transferred to [facility] for definitive care  Pertinent  Medical History  Recent Left hip replacement, pyelonephritis, hypertension, depression, anxiety, coronary artery disease, prior cardiac catheterization, prior hernia repair.  Last echocardiogram in July 2024 showed EF 55 to 60%, recurrent UTIs.  Discussed this  Significant Hospital Events: Including procedures, antibiotic start and stop dates in addition to other pertinent events    9/16 admitted with septic shock initially felt urinary tract source, abx started 9/16 through 9/17 cultures positive for GPC in pairs and clusters, PCR suggesting E facialis, echocardiogram suggesting mitral valve endocarditis, transferred to [facility] TEE obtained at bedside and ccm admitting  9/20 PROCEDURES:   MITRAL VALVE (MV) REPLACEMENT  ASCENDING AORTIC ROOT REPLACEMENT UTILIZING 27 KONECT RESILIA AORTIC VALVE CONDUIT (N/A) TRANSESOPHAGEAL ECHOCARDIOGRAM (N/A)  Interim History / Subjective:   Remains on continuous infusions, intubated on mechanical life support critically ill.  Objective   Blood pressure 110/60, pulse 66, temperature 99.9 F (37.7 C), resp. rate (!) 22, weight 112.1 kg, SpO2 98%. CVP:  [13 mmHg-15 mmHg] 15 mmHg  Vent Mode: PRVC FiO2 (%):  [50 %-60 %] 60 % Set Rate:  [22 bmp] 22 bmp Vt Set:  [600 mL] 600 mL PEEP:  [5 cmH20] 5 cmH20 Plateau Pressure:  [17 cmH20-19 cmH20] 17 cmH20   Intake/Output Summary (Last 24 hours) at 03/31/2023 1014 Last data filed at 03/31/2023 0940 Gross per 24 hour  Intake 2446.97 ml  Output 3770 ml  Net -1323.03 ml   Ceasar Mons  Weights   03/29/23 0115 03/30/23 0349 03/31/23 0430  Weight: 110.4 kg 111.8 kg 112.1 kg    Examination: Intubated/sedated Pupils  small, equal,, reactive Mediastinal drains in place sanguinous output Sternotomy dressing without strikethrough Lungs pretty clear, driving pressure on vent [id number] (22-10) Ext warm Heart sounds regular, sinus on monitor  ABG looks okay CBC improving WBC CBG ok CXR last night L>R airspace disease  Resolved Hospital Problem list     Assessment & Plan:   Septic shock secondary to  E Facialis bacteremia and AV/MV endocarditis, baseline aortic root dilation- s/p MVR + aortic root replacement 03/31/23 Acute hypoxic resp failure 2/2 pulmonary edema and possible element of ALI Acute renal failure secondary to septic shock Acute metabolic encephalopathy 2/2 sepsis and multifocal CVA 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; bilateral small ceribellar/parietal, R PCA larger infarct Chronic anemia  H/o CAD w/ elevated trop I presumed type 2 NSTEMI HLD HTN Hyperglycemia Hx AAA  - Drain management per TCTS - Pressors for MAP 65, albumin PRN for post pump vasoplegia on top of septic shock - Vent bundle, check ABG/CXR - PAD protocol - Will not be rapid wean due to concurrent strokes, encephalopathy, acute lung injury (high FiO2 needs - Prolonged abx as outlined by ID  Best Practice (right click and "Reselect all SmartList Selections" daily)   Diet/type: NPO w/ meds via tube DVT prophylaxis: prophylactic heparin  GI prophylaxis: H2B Lines: Central line Foley:  Yes, and it is still needed Code Status:  full code Last date of multidisciplinary goals of care discussion [pending]  35 min cc time Myrla Halsted MD PCCM  Pulmonary Critical Care 03/31/2023 10:14 AM

## 2023-03-31 NOTE — Anesthesia Procedure Notes (Signed)
Central Venous Catheter Insertion Performed by: Val Eagle, MD, anesthesiologist Start/End9/20/2024 7:33 AM, 03/31/2023 7:43 AM Patient location: OR. Preanesthetic checklist: patient identified, IV checked, site marked, risks and benefits discussed, surgical consent, monitors and equipment checked, pre-op evaluation, timeout performed and anesthesia consent Patient sedated Hand hygiene performed  and maximum sterile barriers used  Catheter size: 9 Fr Total catheter length 10. Central line was placed.MAC introducer Procedure performed using ultrasound guided technique. Ultrasound Notes:anatomy identified, needle tip was noted to be adjacent to the nerve/plexus identified, no ultrasound evidence of intravascular and/or intraneural injection and image(s) printed for medical record Attempts: 1 Following insertion, dressing applied, line sutured and Biopatch. Post procedure assessment: blood return through all ports, free fluid flow and no air  Patient tolerated the procedure well with no immediate complications.

## 2023-03-31 NOTE — Progress Notes (Signed)
Requiring freq PRNs on top of high dose fent dex at 1.2 FIO2 still 100% peep still 10  CT output slowing down 180->180->90 Review of post-op labs  Hgb 7.5, plts 126, INR 1.7, glucose at goal \ Got 1 unit blood.   Plan Cont w/ post-op course as directed by surgical team F/u abg Change sedation to prop and fent

## 2023-03-31 NOTE — Progress Notes (Signed)
STROKE TEAM PROGRESS NOTE   BRIEF HPI [name] is a 57 y.o. male with history of hypertension, CAD, UTIs with pyelonephritis, left hip arthroplasty in April 2024, depression, anxiety and hernia repair who originally presented with nausea lightheadedness, generalized weakness, fever and chills.  He was found to have a UTI and was noted to be septic.  He required intubation and blood pressure support with pressors.  He was found to have endocarditis and was transferred here for definitive care.  He is being treated with appropriate antibiotics.  He is currently undergoing evaluation for mitral valve surgery.  MRI brain performed during his workup demonstrated an embolic appearing right PCA stroke.  He also had several smaller, scattered strokes.   SIGNIFICANT HOSPITAL EVENTS 9/17 transferred to [facility] for treatment of endocarditis 9/18 found to have right PCA stroke on MRI  INTERIM HISTORY/SUBJECTIVE RN is at the bedside. Pt had MV/AV replacement surgery this morning. Now post op, still hypotension, on pressors and s/p albumin. On PRBC transfusion. Also on heavy sedation, as he is actively moving all extremities without sedation per RNs.    OBJECTIVE  CBC    Component Value Date/Time   WBC 33.3 (H) 03/31/2023 1322   RBC 2.96 (L) 03/31/2023 1322   HGB 7.5 (L) 03/31/2023 1322   HCT 24.6 (L) 03/31/2023 1322   PLT 126 (L) 03/31/2023 1322   MCV 83.1 03/31/2023 1322   MCH 25.3 (L) 03/31/2023 1322   MCHC 30.5 03/31/2023 1322   RDW 17.7 (H) 03/31/2023 1322    BMET    Component Value Date/Time   NA 146 (H) 03/31/2023 1227   K 4.5 03/31/2023 1227   CL 111 03/31/2023 1219   CO2 27 03/31/2023 0347   GLUCOSE 181 (H) 03/31/2023 1219   BUN 53 (H) 03/31/2023 1219   CREATININE 1.20 03/31/2023 1219   CREATININE 1.38 (H) 03/17/2023 0851   CALCIUM 9.3 03/31/2023 0347   GFRNONAA 57 (L) 03/31/2023 0347   GFRNONAA 60 (L) 03/17/2023 0851    IMAGING past 24 hours DG Chest Port 1  View  Result Date: 03/31/2023 CLINICAL DATA:  [id number] S/P aortic valve replacement and aortoplasty [id number] EXAM: PORTABLE CHEST 1 VIEW COMPARISON:  March 30, 2023 FINDINGS: The cardiomediastinal silhouette is unchanged and enlarged in contour.Status post median sternotomy and aortic valve replacement. ETT tip terminates 4.4 cm above the carina. LEFT PA catheter tip terminates over the main pulmonary artery. RIGHT IJ CVC tip terminates over the SVC. The enteric tube courses through the chest to the abdomen beyond the field-of-view. Midline surgical drains. Likely small bilateral pleural effusions. No significant pneumothorax. Perihilar vascular fullness with interstitial markings and vascular indistinctness. IMPRESSION: 1. Support apparatus as described above. 2. Constellation of findings are favored to reflect pulmonary edema with small bilateral pleural effusions. Electronically Signed   By: Meda Klinefelter M.D.   On: 03/31/2023 14:07   EP STUDY  Result Date: 03/31/2023 See surgical note for result.  DG CHEST PORT 1 VIEW  Result Date: 03/30/2023 CLINICAL DATA:  Intubated EXAM: PORTABLE CHEST 1 VIEW COMPARISON:  03/29/2023 FINDINGS: Single frontal view of the chest demonstrates endotracheal tube overlying tracheal air column, tip 4.5 cm above carina. Enteric catheter passes below diaphragm, tip excluded by collimation. Stable right internal jugular catheter. The cardiac silhouette is stable. Increasing bilateral airspace disease, with new right basilar consolidation and effusion. No pneumothorax. No acute bony abnormalities. IMPRESSION: 1. Support devices as above. 2. Progressive bilateral airspace disease and developing right  pleural effusion. Electronically Signed   By: Sharlet Salina M.D.   On: 03/30/2023 23:05    Vitals:   03/31/23 1445 03/31/23 1500 03/31/23 1515 03/31/23 1545  BP:    97/81  Pulse: (!) 111 (!) 104 (!) 104 (!) 106  Resp: (!) 25 (!) 26 (!) 26 (!) 26  Temp: 98.4 F (36.9 C)  98.2 F (36.8 C) 98.1 F (36.7 C) 98.4 F (36.9 C)  TempSrc:      SpO2: 100% 100% 100% 100%  Weight:         PHYSICAL EXAM General: Intubated, ill-appearing patient in no acute distress CV: Regular rate and rhythm on monitor Respiratory: Respirations synchronous with ventilator   NEURO Pt is intubated on heavy sedation. Per RN, once sedation off, he was able to move all extremities. Currently open eyes on voice but not following commands. Eyes in mid position, not blinking to visual threat bilaterally, doll's eyes absent, not tracking. Corneal reflex weakness present, gag and cough present. Breathing over the vent.  Facial symmetry not able to test due to ET tube.  Tongue protrusion not cooperative. On pain stimulation, no movement in all extremities. Sensation, coordination and gait not tested.    ASSESSMENT/PLAN  Acute Ischemic Infarct:  embolic shower with largest at right PCA infarct and small right frontal SAH, etiology: Cardioembolic in the setting of endocarditis CT head subacute medial right occipital lobe nonhemorrhagic infarct with focal extra-axial hyperdensity over posterior right frontal convexity likely representing small subdural hematoma with subarachnoid blood subadjacent to this area MRI confluent right PCA territory infarct, additional similar acute infarcts in bilateral parietal lobes in the cerebellum, similar small volume acute subarachnoid hemorrhage along right frontal convexity MRA right P4 occlusion, no overt mycotic aneurysm seen Carotid Doppler unremarkable TEE: 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, aortic root aneurysm, aortic valve endocarditis with severe aortic valve regurgitation, small aortic valve vegetation, EF 65 to 70% LDL 76 HgbA1c 6.2 UDS benzo + VTE prophylaxis - SCDs aspirin 325 mg daily prior to admission, now on aspirin 325 mg  daily Therapy recommendations: Pending Disposition: Pending  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 Fever Tmax 102->101.5 Leukocytosis WBC 18.0->27.8 Patient has severe mitral and aortic valve endocarditis Patient is on appropriate antibiotics with ampicillin and ceftriaxone  Cardiothoracic surgery is on board s/p AVR and MVR 9/20  History of hypertension Cardiogenic shock Home meds: Atenolol 12.5 mg daily On the low end, requiring Levophed S/p albumin Cardiology on board BP goal normotensive  Hyperlipidemia Home meds: Atorvastatin 80 mg, resumed in hospital LDL 76, goal < 70 Add ezetimibe 10 mg daily Continue home lipitor 80 Continue statin at discharge  Respiratory failure Patient was intubated due to respiratory failure in the setting of sepsis Ventilator management per CCM Wean ventilatory support as able  Other Stroke Risk Factors Obesity, Body mass index is 34.47 kg/m., BMI >/= 30 associated with increased stroke risk, recommend weight loss, diet and exercise as appropriate  Coronary artery disease  Other Active Problems Dysphagia on TF @ 20 AKI, Cre 2.70->1.70->1.20 Anemia s/p surgery -> on PRBS transfusion  Hospital day # 3  Neurology has nothing to add at this time, will sign off. Please feel free to call with questions. Thanks for the consult.   Marvel Plan, MD PhD Stroke Neurology 03/31/2023 4:17 PM    To contact Stroke Continuity provider, please refer to [email or web address]. After hours, contact General Neurology

## 2023-03-31 NOTE — Transfer of Care (Signed)
Immediate Anesthesia Transfer of Care Note  Patient: Dale Mills  Procedure(s) Performed: MITRAL VALVE (MV) REPLACEMENT UTILIZING SIZE 33 MOSAIC PORCINE HEART VALVE (Chest) ASCENDING AORTIC ROOT REPLACEMENT UTILIZING 27 KONECT RESILIA  AORTIC VALVE CONDUIT (Chest) TRANSESOPHAGEAL ECHOCARDIOGRAM  Patient Location: SICU  Anesthesia Type:General  Level of Consciousness: Patient remains intubated per anesthesia plan  Airway & Oxygen Therapy: Patient remains intubated per anesthesia plan  Post-op Assessment: Report given to RN and Post -op Vital signs reviewed and stable  Post vital signs: Reviewed and stable  Last Vitals:  Vitals Value Taken Time  BP 90/60   Temp 36.1 C 03/31/23 1321  Pulse 83 03/31/23 1321  Resp 22 03/31/23 1321  SpO2 94 % 03/31/23 1321  Vitals shown include unfiled device data.  Last Pain:  Vitals:   03/31/23 0400  TempSrc: Bladder         Complications: No notable events documented.

## 2023-03-31 NOTE — Anesthesia Procedure Notes (Signed)
Central Venous Catheter Insertion Performed by: Val Eagle, MD, anesthesiologist Start/End9/20/2024 7:33 AM, 03/31/2023 7:43 AM Patient location: OR. Preanesthetic checklist: patient identified, IV checked, site marked, risks and benefits discussed, surgical consent, monitors and equipment checked, pre-op evaluation, timeout performed and anesthesia consent Hand hygiene performed  and maximum sterile barriers used  PA cath was placed.Swan type:thermodilution Procedure performed without using ultrasound guided technique. Attempts: 1 Patient tolerated the procedure well with no immediate complications.

## 2023-03-31 NOTE — Brief Op Note (Signed)
03/28/2023 - 03/31/2023  12:41 PM  PATIENT:  Dale Mills  57 y.o. male  PRE-OPERATIVE DIAGNOSIS:   MITRAL VALVE ENDOCARDITIS SEVERE MITRAL REGURGITATION, SEVERE AORTIC INSUFFICIENCY  POST-OPERATIVE DIAGNOSIS:  MITRAL VALVE ENDOCARDITIS SEVERE MITRAL REGURGITATION, SEVERE AORTIC INSUFFICIENCY  PROCEDURES:   MITRAL VALVE (MV) REPLACEMENT  ASCENDING AORTIC ROOT REPLACEMENT UTILIZING 27 KONECT RESILIA AORTIC VALVE CONDUIT (N/A) TRANSESOPHAGEAL ECHOCARDIOGRAM (N/A)  SURGEON: Eugenio Hoes, MD - Primary  PHYSICIAN ASSISTANT: Iam Lipson  ASSISTANTS: Ria Bush, RN, Scrub Person         Maricela Curet, RN, Scrub Person   ANESTHESIA:   general  EBL:  BLOOD ADMINISTERED: FFP, PBC's  DRAINS:  bilateral pleural and mediastinal drains.     LOCAL MEDICATIONS USED:  NONE  SPECIMEN:  Aortic and mitral valve leaflets, vegetations  DISPOSITION OF SPECIMEN:  PATHOLOGY  COUNTS:  Correct  DICTATION: .Dragon Dictation  PLAN OF CARE: Admit to inpatient   PATIENT DISPOSITION:  ICU - intubated and hemodynamically stable.   Delay start of Pharmacological VTE agent (>24hrs) due to surgical blood loss or risk of bleeding: yes

## 2023-03-31 NOTE — Anesthesia Preprocedure Evaluation (Signed)
Anesthesia Evaluation  Patient identified by MRN, date of birth, ID band Patient unresponsive    Reviewed: Unable to perform ROS - Chart review only  History of Anesthesia Complications Negative for: history of anesthetic complications  Airway Mallampati: Intubated       Dental   Pulmonary     + decreased breath sounds      Cardiovascular hypertension, Pt. on medications and Pt. on home beta blockers + angina  + CAD   Rhythm:Regular  1. 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 and  anterior leaflet windsock aneurysm with torrential mitral valve  regurgitation. Vegetation is at least 2.5 x 1.2 cm. The mitral valve is  abnormal. Torrential mitral valve regurgitation.   2. 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 Severe aortic valve regurgitation with holosystolic reversals  in the descending thoracic aorta, reversal TVI   16 cm. Small aortic valve vegetation seen below AV along aortomitral  continuity but not contiguous with mitral valve vegetation. No evidence of  aortic root abscess . The aortic valve is abnormal. Aortic valve  regurgitation is severe. Aortic  regurgitation PHT measures 198 msec.   3. No evidence of aortic dissection. Aortic dilatation noted. 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 There is severe dilatation of the aortic  root.   4. Left ventricular ejection fraction, by estimation, is 65 to 70%. The  left ventricle has normal function.   5. Right ventricular systolic function is normal. The right ventricular  size is normal.   6. Left atrial size was mildly dilated. No left atrial/left atrial  appendage thrombus was detected.   7. A small pericardial effusion is present.     Neuro/Psych  PSYCHIATRIC DISORDERS Anxiety Depression       GI/Hepatic negative GI ROS, Neg liver  ROS,,,  Endo/Other  Lab Results      Component                Value               Date                      HGBA1C                   6.2 (H)             03/29/2023             Renal/GU Renal InsufficiencyRenal diseaseLab Results      Component                Value               Date                      NA                       146 (H)             03/31/2023                K                        4.7                 03/31/2023  CO2                      27                  03/31/2023                GLUCOSE                  177 (H)             03/31/2023                BUN                      55 (H)              03/31/2023                CREATININE               1.30 (H)            03/31/2023                CALCIUM                  9.3                 03/31/2023                GFRNONAA                 57 (L)              03/31/2023                Musculoskeletal  (+) Arthritis ,    Abdominal   Peds  Hematology  (+) Blood dyscrasia, anemia   Anesthesia Other Findings Patient has severe mitral valve endocarditis with large multilobed vegetation and large windsock deformity of the anterior mitral leaflet.  In addition he has aortic root dilation measuring approximately 53 mm with resultant poor coaptation of the aortic valve cusps, and probable infective change of the aortic valve leaflets, with a small vegetation seen on the ventricular aspect of that aortic valve along the aorto mitral continuity.  Very complex situation, concern for feasibility of repair given the degree of mitral valve endocarditis and likely subacute presentation with windsock deformity and torrential MR. -Patient continues on sedation which has been transition to Precedex, blood pressure supported with norepinephrine. No increasing requirements per nursing. Trial of weaning sedation today per PCCM. - ID consulted but I do not see formal recommendations, abx per PCCM. - TCTS has seen, recommended head  imaging which is positive for stroke, awaiting further decisions from TCTS.    Reproductive/Obstetrics                              Anesthesia Physical Anesthesia Plan  ASA: 5  Anesthesia Plan: General   Post-op Pain Management:    Induction: Inhalational  PONV Risk Score and Plan: 2 and Treatment may vary due to age or medical condition  Airway Management Planned: Oral ETT  Additional Equipment: Arterial line, CVP, Ultrasound Guidance Line Placement and PA Cath  Intra-op Plan:   Post-operative Plan: Post-operative intubation/ventilation  Informed Consent:      History available from chart only  Plan Discussed with: CRNA and Surgeon  Anesthesia Plan Comments:  Anesthesia Quick Evaluation

## 2023-03-31 NOTE — Plan of Care (Signed)
  Problem: Clinical Measurements: Goal: Ability to maintain clinical measurements within normal limits will improve Outcome: Progressing   Problem: Nutrition: Goal: Adequate nutrition will be maintained Outcome: Progressing   Problem: Elimination: Goal: Will not experience complications related to bowel motility Outcome: Progressing Goal: Will not experience complications related to urinary retention Outcome: Progressing   Problem: Safety: Goal: Ability to remain free from injury will improve Outcome: Progressing   Problem: Skin Integrity: Goal: Risk for impaired skin integrity will decrease Outcome: Progressing   Problem: Respiratory: Goal: Ability to maintain a clear airway and adequate ventilation will improve Outcome: Progressing

## 2023-03-31 NOTE — Anesthesia Procedure Notes (Signed)
Arterial Line Insertion Start/End9/20/2024 7:33 AM, 03/31/2023 7:43 AM Performed by: Val Eagle, MD, anesthesiologist  Patient location: Pre-op. Preanesthetic checklist: patient identified, IV checked, site marked, risks and benefits discussed, surgical consent, monitors and equipment checked, pre-op evaluation, timeout performed and anesthesia consent Patient sedated Right, brachial was placed Catheter size: 20 G Hand hygiene performed  and maximum sterile barriers used   Attempts: 1 Procedure performed using ultrasound guided technique. Ultrasound Notes:anatomy identified, needle tip was noted to be adjacent to the nerve/plexus identified, no ultrasound evidence of intravascular and/or intraneural injection and image(s) printed for medical record Following insertion, dressing applied. Post procedure assessment: normal and unchanged  Patient tolerated the procedure well with no immediate complications.

## 2023-03-31 NOTE — Progress Notes (Signed)
Pt attempted to remove ETT during extreme agitation. Pt noted to also be sitting himself up and kicking legs. This RN and other staff were able to keep the pt in the bed with additional boluses of medication. Elink alerted and PRN Versed ordered. RT changed ETT holder and chest x-ray ordered to confirm placement after pt attempted to remove ETT. Pt remains intubated and sedated with mitts on.

## 2023-03-31 NOTE — Progress Notes (Signed)
Pt again extremely agitated and requiring boluses of medication. Pt attempting to remove ETT again. Bilateral soft wrist restraints placed on the pt per order. Pt remains intubated and sedated.

## 2023-03-31 NOTE — Op Note (Signed)
CARDIOVASCULAR SURGERY OPERATIVE NOTE  03/31/2023 Dale Mills [id number]  Surgeon:  Ashley Akin, MD  First Assistant: Jillyn Hidden [facility]                               An experienced assistant was required given the complexity of this surgery and the standard of surgical care. The assistant was needed for exposure, dissection, suctioning, retraction of delicate tissues and sutures, instrument exchange and for overall help during this procedure.     Preoperative Diagnosis: Endocarditis of the mitral and aortic valve with severe regurgitation of both valves and large vegetation on mitral valve   Postoperative Diagnosis:  Same   Procedure: Mitral Valve replacement with a 31 mm Mosaic Porcine valve  Aortic Root replacement  (Bental) with a 27mm Connect Pericardial valve conduit  Anesthesia:  General Endotracheal   Clinical History/Surgical Indication:  Patient critically ill with Hypotensive Septic Shock, Aortic Valve Endocarditis with severe AI and Aortic Root Enlargement, and Mitral Valve Endocarditis with severe MR.   Findings: Ventricular function appeared preserved on preoperative TEE.  There was severe mitral and aortic valve regurgitation with large vegetation on the anterior leaflet of the mitral valve which was very mobile.  Conclusion the procedure the patient was in normal sinus rhythm with a well-functioning mitral valve apparatus along with the aortic valve.  Patient had preserved ventricular function.  Preparation:  The patient was seen in the preoperative holding area and the correct patient, correct operation were confirmed with the patient after reviewing the medical record and catheterization. The consent was signed by me. Preoperative antibiotics were given. A pulmonary arterial line and radial arterial line were placed by the anesthesia team. The patient was taken back to the operating room and positioned supine on the operating room table. After being placed  under general endotracheal anesthesia by the anesthesia team a foley catheter was placed. The neck, chest, abdomen, and both legs were prepped with betadine soap and solution and draped in the usual sterile manner. A surgical time-out was taken and the correct patient and operative procedure were confirmed with the nursing and anesthesia staff.  Operation: Median sternotomy incision was then created sternal valve sternal saw.  Xiphoid process was resected.  Heparin was delivered and a pericardial well developed.  Aorta was cannulated with a 20 French aortic cannula and a 36 French straight venous cannula was placed in the right atrial appendage and directed towards the inferior vena cava.  An additional 28 right angled metal tipped cannulas placed in the superior vena cava.  With adequate confirmation of anticoagulation cardiopulmonary bypass was instituted.  Antegrade and retrograde cardioplegia catheters were placed in the ascending aorta and coronary sinus effectively. Aortic cross-clamp was placed and cold [place] blood cardioplegia was delivered for 1500 cc retrograde.  An additional dose was delivered at 1 hour after cross-clamp.  And a reanimation dose delivered just prior to cross-clamp removal. The mitral valve was approached through the left atrial incision and excellent exposure of the mitral valve was obtained.  The anterior leaflet with the very large vegetation was resected and sent for specimen.  The left atrial appendage was oversewn with a running 4-0 Prolene suture. Utilizing eighteen 2-0 Tevdek pledgeted sutures with the pledgets on the atrial side a 31 mm Mosaic pericardial tissue valve was secured with the cor knot system.  The left atrium was then closed over a ventricular sump. The aorta was then  transected and carried down to the sinotubular junction.  Aortic valve was identified and again there appeared to be endocarditis on the noncoronary cusp cusp were resected and noncoronary  cusp assessment sent for specimen. The aortic root was then resected and coronary buttons were developed. Utilizing eighteen 2-0 Tevdek pledgeted sutures with the pledgets exterior a 27 mm connect pericardial valve conduit was then secured with the cor knot system. The coronary buttons were then attached to the graft with a running 5-0 Prolene sutures on the appropriate areas of the conduit.  CoSeal was utilized to secure the anastomoses.  The ascending aorta and graft were then resected to length and the distal anastomosis was performed with a running 4-0 Prolene suture. With the patient in headdown position aortic cross-clamp was removed and multiple de-airing maneuvers performed.  Ventricular and atrial pacing wires were placed and brought out through inferior stab was and secured. The left atrial sump was removed and the patient was weaned from cardiopulmonary bypass on small amount of inotropic support.  With adequate hemodynamics protamine was delivered and the patient was decannulated and sites oversewn necessary.  Chest tubes were brought inferior stab was and secured. With adequate hemostasis the sternum was reapproximated with interrupted stainless steel wires and the presternal subcutaneous tissue and skin were closed multiple layers of absorbable suture.  Sterile dressings were applied.

## 2023-03-31 NOTE — Interval H&P Note (Signed)
History and Physical Interval Note:  03/31/2023 6:41 AM  Dale Mills  has presented today for surgery, with the diagnosis of LARGE MV ENDOCARDITIS SEVERE MR SEVERE AI.  The various methods of treatment have been discussed with the patient and family. After consideration of risks, benefits and other options for treatment, the patient has consented to  Procedure(s): AORTIC VALVE REPLACEMENT (AVR) (N/A) MITRAL VALVE (MV) REPLACEMENT (N/A) ASCENDING AORTIC ROOT REPLACEMENT (N/A) TRANSESOPHAGEAL ECHOCARDIOGRAM (N/A) as a surgical intervention.  The patient's history has been reviewed, patient examined, no change in status, stable for surgery.  I have reviewed the patient's chart and labs.  Questions were answered to the patient's satisfaction.     Eugenio Hoes

## 2023-03-31 NOTE — Progress Notes (Signed)
Id brief note. Non-charge note    S/p urgent mv replacement and aortic root replacement today due to critically ill patient from septic shock/cardiogenic shock    A/p Continue amp/ceftriaxone Left prosthetic hip joint pain will some time this admission be need to imaged w/u for involvement of infection

## 2023-03-31 NOTE — Progress Notes (Signed)
Patient ID: Dale Mills, male   DOB: [birth year], 57 y.o.   MRN: [id number]  TCTS Evening Rounds:   Hemodynamically stable  CI = 1.2  Intubated on Precedex but starting to move around.  Urine output good  CT output increased first few hrs but has decreased considerably and tubes not clotted.  Transfusing 2 units PRBC's for Hgb 7.5.  CBC    Component Value Date/Time   WBC 33.3 (H) 03/31/2023 1322   RBC 2.96 (L) 03/31/2023 1322   HGB 7.5 (L) 03/31/2023 1322   HCT 24.6 (L) 03/31/2023 1322   PLT 126 (L) 03/31/2023 1322   MCV 83.1 03/31/2023 1322   MCH 25.3 (L) 03/31/2023 1322   MCHC 30.5 03/31/2023 1322   RDW 17.7 (H) 03/31/2023 1322     BMET    Component Value Date/Time   NA 146 (H) 03/31/2023 1227   K 4.5 03/31/2023 1227   CL 111 03/31/2023 1219   CO2 27 03/31/2023 0347   GLUCOSE 181 (H) 03/31/2023 1219   BUN 53 (H) 03/31/2023 1219   CREATININE 1.20 03/31/2023 1219   CREATININE 1.38 (H) 03/17/2023 0851   CALCIUM 9.3 03/31/2023 0347   GFRNONAA 57 (L) 03/31/2023 0347   GFRNONAA 60 (L) 03/17/2023 0851     A/P:  Stable postop course. Continue current plans. Plan to keep on vent tonight.

## 2023-03-31 NOTE — Hospital Course (Addendum)
Referring: Dale Benedict, MD Primary Care: Dale Done., MD Primary Cardiologist:Dale Bing Matter, MD  History of Present Illness:       Dale Mills is a 57 yo male with known history of Non-obstructive CAD, HLD, HTN, recurrent UTI/Pylonephritis infections, and recent hip replacement surgery (10/2022).  He presented to [facility] with complaints nausea, weakness, fever, and chills.  Workup in the ED showed the patient to be hypotensive, + leukocytosis, AKI with creatinine level of 2.6, and tachycardia.  He was started on fluids, which unfortunately let to flash pulmonary edema that required intubation.  He was started on broad spectrum ABX, blood cultures were obtained which showed Enterococcus Faecalis.  TTE was obtained and was concerning for a Mitral Valve Vegetation.  He was transferred to [facility] for further care.  Urgent Cardiology consult was obtained due to loud murmurs on exam.  Stat Echocardiogram was performed and showed normal biventricular function, there was severe AI prompting concern for Aortic Dissection and ICU TEE was performed at bedside.  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  Dissection was able to be excluded with TEE.  He remains critically ill, intubated, on broad spectrum antibiotics, and Levophed for hypotensive septic shock.  Cardiothoracic surgery consultation has been requested.  The patient is sedated on vent.  His daughters are at bedside.  They states he hasn't been himself since his hip surgery.  They states he just kept saying I was fine until I had this done.  He has been having episodic N/V 2-3 times per day.  They state his primary physician felt it was related to anemia and he was scheduled to have IV iron infusion this coming Friday.  He has his own teeth and was  most recently seen at the dentist prior to his hip replacement.  Dale Mills was evaluated by Dr. Leafy Mills and felt to be a candidate for urgent mitral valve replacement and aortic root replacement.  Hospital Course: [name] was taken to the operating room on 03/31/2023 where aortic valve replacement was carried out utilizing a right prosthetic valve.  The anterior leaflet was frayed and had a large vegetation.  It was excised and the posterior leaflet was left intact.  Aortic root replacement was carried out with a 27 mm Konect pericardial valve conduit.  Following the procedure, he separated from cardiopulmonary bypass without difficulty on vasopressin and norepinephrine.  He was transported to the surgical ICU in stable condition.  He received 2 units of fresh frozen plasma and 1 unit of packed red blood cells intraoperatively.  Ceftriaxone and ampicillin were continued postoperatively.

## 2023-04-01 ENCOUNTER — Encounter (HOSPITAL_COMMUNITY): Payer: Self-pay | Admitting: Thoracic Surgery (Cardiothoracic Vascular Surgery)

## 2023-04-01 ENCOUNTER — Inpatient Hospital Stay (HOSPITAL_COMMUNITY): Payer: Medicaid Other

## 2023-04-01 DIAGNOSIS — B952 Enterococcus as the cause of diseases classified elsewhere: Secondary | ICD-10-CM

## 2023-04-01 DIAGNOSIS — R7881 Bacteremia: Secondary | ICD-10-CM

## 2023-04-01 LAB — BASIC METABOLIC PANEL
Anion gap: 12 (ref 5–15)
Anion gap: 10 (ref 5–15)
BUN: 50 mg/dL — ABNORMAL HIGH (ref 6–20)
BUN: 40 mg/dL — ABNORMAL HIGH (ref 6–20)
CO2: 22 mmol/L (ref 22–32)
CO2: 24 mmol/L (ref 22–32)
Calcium: 8.6 mg/dL — ABNORMAL LOW (ref 8.9–10.3)
Calcium: 8.6 mg/dL — ABNORMAL LOW (ref 8.9–10.3)
Chloride: 110 mmol/L (ref 98–111)
Chloride: 113 mmol/L — ABNORMAL HIGH (ref 98–111)
Creatinine, Ser: 1.43 mg/dL — ABNORMAL HIGH (ref 0.61–1.24)
Creatinine, Ser: 1.12 mg/dL (ref 0.61–1.24)
GFR, Estimated: 57 mL/min — ABNORMAL LOW (ref >60)
GFR, Estimated: >60 mL/min (ref >60)
Glucose, Bld: 187 mg/dL — ABNORMAL HIGH (ref 70–99)
Glucose, Bld: 101 mg/dL — ABNORMAL HIGH (ref 70–99)
Potassium: 4 mmol/L (ref 3.5–5.1)
Potassium: 3.4 mmol/L — ABNORMAL LOW (ref 3.5–5.1)
Sodium: 144 mmol/L (ref 135–145)
Sodium: 147 mmol/L — ABNORMAL HIGH (ref 135–145)

## 2023-04-01 LAB — POCT I-STAT 7, (LYTES, BLD GAS, ICA,H+H)
Acid-base deficit: 3 mmol/L — ABNORMAL HIGH (ref 0.0–2.0)
Acid-base deficit: 2 mmol/L (ref 0.0–2.0)
Acid-base deficit: 2 mmol/L (ref 0.0–2.0)
Bicarbonate: 22.5 mmol/L (ref 20.0–28.0)
Bicarbonate: 22.8 mmol/L (ref 20.0–28.0)
Bicarbonate: 23.1 mmol/L (ref 20.0–28.0)
Calcium, Ion: 1.25 mmol/L (ref 1.15–1.40)
Calcium, Ion: 1.27 mmol/L (ref 1.15–1.40)
Calcium, Ion: 1.27 mmol/L (ref 1.15–1.40)
HCT: 22 % — ABNORMAL LOW (ref 39.0–52.0)
HCT: 21 % — ABNORMAL LOW (ref 39.0–52.0)
HCT: 24 % — ABNORMAL LOW (ref 39.0–52.0)
Hemoglobin: 7.5 g/dL — ABNORMAL LOW (ref 13.0–17.0)
Hemoglobin: 7.1 g/dL — ABNORMAL LOW (ref 13.0–17.0)
Hemoglobin: 8.2 g/dL — ABNORMAL LOW (ref 13.0–17.0)
O2 Saturation: 100 %
O2 Saturation: 99 %
O2 Saturation: 98 %
Patient temperature: 37.6
Patient temperature: 37.3
Patient temperature: 36.9
Potassium: 4.2 mmol/L (ref 3.5–5.1)
Potassium: 4.1 mmol/L (ref 3.5–5.1)
Potassium: 3.3 mmol/L — ABNORMAL LOW (ref 3.5–5.1)
Sodium: 146 mmol/L — ABNORMAL HIGH (ref 135–145)
Sodium: 147 mmol/L — ABNORMAL HIGH (ref 135–145)
Sodium: 148 mmol/L — ABNORMAL HIGH (ref 135–145)
TCO2: 24 mmol/L (ref 22–32)
TCO2: 24 mmol/L (ref 22–32)
TCO2: 24 mmol/L (ref 22–32)
pCO2 arterial: 43.5 mmHg (ref 32–48)
pCO2 arterial: 38.6 mmHg (ref 32–48)
pCO2 arterial: 39.9 mmHg (ref 32–48)
pH, Arterial: 7.326 — ABNORMAL LOW (ref 7.35–7.45)
pH, Arterial: 7.381 (ref 7.35–7.45)
pH, Arterial: 7.371 (ref 7.35–7.45)
pO2, Arterial: 210 mmHg — ABNORMAL HIGH (ref 83–108)
pO2, Arterial: 157 mmHg — ABNORMAL HIGH (ref 83–108)
pO2, Arterial: 108 mmHg (ref 83–108)

## 2023-04-01 LAB — CBC
HCT: 22.6 % — ABNORMAL LOW (ref 39.0–52.0)
HCT: 25.8 % — ABNORMAL LOW (ref 39.0–52.0)
Hemoglobin: 7.3 g/dL — ABNORMAL LOW (ref 13.0–17.0)
Hemoglobin: 8.4 g/dL — ABNORMAL LOW (ref 13.0–17.0)
MCH: 27.4 pg (ref 26.0–34.0)
MCH: 27.5 pg (ref 26.0–34.0)
MCHC: 32.3 g/dL (ref 30.0–36.0)
MCHC: 32.6 g/dL (ref 30.0–36.0)
MCV: 85 fL (ref 80.0–100.0)
MCV: 84.3 fL (ref 80.0–100.0)
Platelets: 155 10*3/uL (ref 150–400)
Platelets: 110 10*3/uL — ABNORMAL LOW (ref 150–400)
RBC: 2.66 MIL/uL — ABNORMAL LOW (ref 4.22–5.81)
RBC: 3.06 MIL/uL — ABNORMAL LOW (ref 4.22–5.81)
RDW: 16.8 % — ABNORMAL HIGH (ref 11.5–15.5)
RDW: 16.2 % — ABNORMAL HIGH (ref 11.5–15.5)
WBC: 24.6 10*3/uL — ABNORMAL HIGH (ref 4.0–10.5)
WBC: 18.6 10*3/uL — ABNORMAL HIGH (ref 4.0–10.5)
nRBC: 2.3 % — ABNORMAL HIGH (ref 0.0–0.2)
nRBC: 1 % — ABNORMAL HIGH (ref 0.0–0.2)

## 2023-04-01 LAB — GLUCOSE, CAPILLARY
Glucose-Capillary: 104 mg/dL — ABNORMAL HIGH (ref 70–99)
Glucose-Capillary: 114 mg/dL — ABNORMAL HIGH (ref 70–99)
Glucose-Capillary: 117 mg/dL — ABNORMAL HIGH (ref 70–99)
Glucose-Capillary: 119 mg/dL — ABNORMAL HIGH (ref 70–99)
Glucose-Capillary: 120 mg/dL — ABNORMAL HIGH (ref 70–99)
Glucose-Capillary: 120 mg/dL — ABNORMAL HIGH (ref 70–99)
Glucose-Capillary: 122 mg/dL — ABNORMAL HIGH (ref 70–99)
Glucose-Capillary: 124 mg/dL — ABNORMAL HIGH (ref 70–99)
Glucose-Capillary: 129 mg/dL — ABNORMAL HIGH (ref 70–99)
Glucose-Capillary: 138 mg/dL — ABNORMAL HIGH (ref 70–99)
Glucose-Capillary: 141 mg/dL — ABNORMAL HIGH (ref 70–99)
Glucose-Capillary: 148 mg/dL — ABNORMAL HIGH (ref 70–99)
Glucose-Capillary: 180 mg/dL — ABNORMAL HIGH (ref 70–99)
Glucose-Capillary: 180 mg/dL — ABNORMAL HIGH (ref 70–99)
Glucose-Capillary: 180 mg/dL — ABNORMAL HIGH (ref 70–99)
Glucose-Capillary: 184 mg/dL — ABNORMAL HIGH (ref 70–99)
Glucose-Capillary: 188 mg/dL — ABNORMAL HIGH (ref 70–99)
Glucose-Capillary: 207 mg/dL — ABNORMAL HIGH (ref 70–99)
Glucose-Capillary: 210 mg/dL — ABNORMAL HIGH (ref 70–99)
Glucose-Capillary: 226 mg/dL — ABNORMAL HIGH (ref 70–99)
Glucose-Capillary: 233 mg/dL — ABNORMAL HIGH (ref 70–99)
Glucose-Capillary: 241 mg/dL — ABNORMAL HIGH (ref 70–99)
Glucose-Capillary: 256 mg/dL — ABNORMAL HIGH (ref 70–99)

## 2023-04-01 LAB — TRIGLYCERIDES: Triglycerides: 189 mg/dL — ABNORMAL HIGH (ref <150)

## 2023-04-01 LAB — MAGNESIUM
Magnesium: 3.1 mg/dL — ABNORMAL HIGH (ref 1.7–2.4)
Magnesium: 2.8 mg/dL — ABNORMAL HIGH (ref 1.7–2.4)

## 2023-04-01 LAB — PREPARE RBC (CROSSMATCH)

## 2023-04-01 MED ORDER — ACETAMINOPHEN 160 MG/5ML PO SOLN
1000.0000 mg | Freq: Four times a day (QID) | ORAL | Status: DC
  Administered 2023-04-01: 1000 mg
  Filled 2023-04-01: qty 40.6

## 2023-04-01 MED ORDER — INSULIN DETEMIR 100 UNIT/ML ~~LOC~~ SOLN
30.0000 [IU] | Freq: Two times a day (BID) | SUBCUTANEOUS | Status: DC
  Administered 2023-04-01 (×2): 30 [IU] via SUBCUTANEOUS
  Filled 2023-04-01 (×4): qty 0.3

## 2023-04-01 MED ORDER — INSULIN ASPART 100 UNIT/ML IJ SOLN
2.0000 [IU] | INTRAMUSCULAR | Status: DC
  Administered 2023-04-01 – 2023-04-02 (×2): 2 [IU] via SUBCUTANEOUS
  Administered 2023-04-03: 4 [IU] via SUBCUTANEOUS
  Administered 2023-04-04 – 2023-04-07 (×9): 2 [IU] via SUBCUTANEOUS

## 2023-04-01 MED ORDER — POTASSIUM CHLORIDE 10 MEQ/50ML IV SOLN
10.0000 meq | INTRAVENOUS | Status: AC
  Administered 2023-04-01 (×5): 10 meq via INTRAVENOUS
  Filled 2023-04-01 (×5): qty 50

## 2023-04-01 MED ORDER — FUROSEMIDE 10 MG/ML IJ SOLN
40.0000 mg | Freq: Two times a day (BID) | INTRAMUSCULAR | Status: AC
  Administered 2023-04-01: 40 mg via INTRAVENOUS
  Filled 2023-04-01: qty 4

## 2023-04-01 MED ORDER — ACETAMINOPHEN 500 MG PO TABS
1000.0000 mg | ORAL_TABLET | Freq: Four times a day (QID) | ORAL | Status: DC
  Administered 2023-04-01 (×2): 1000 mg
  Filled 2023-04-01 (×2): qty 2

## 2023-04-01 MED ORDER — SODIUM CHLORIDE 0.9% IV SOLUTION: Freq: Once | INTRAVENOUS | Status: DC

## 2023-04-01 NOTE — Progress Notes (Signed)
Patient ID: Dale Mills, male   DOB: 1966/02/08, 57 y.o.   MRN: [id number] TCTS Evening Rounds:  Hemodynamically stable off pressors. Extubated today He is awake and alert.  Transfused today and received lasix. Diuresing well.  BMET    Component Value Date/Time   NA 147 (H) 04/01/2023 1610   K 3.4 (L) 04/01/2023 1610   CL 113 (H) 04/01/2023 1610   CO2 24 04/01/2023 1610   GLUCOSE 101 (H) 04/01/2023 1610   BUN 40 (H) 04/01/2023 1610   CREATININE 1.12 04/01/2023 1610   CREATININE 1.38 (H) 03/17/2023 0851   CALCIUM 8.6 (L) 04/01/2023 1610   GFRNONAA >60 04/01/2023 1610   GFRNONAA 60 (L) 03/17/2023 0851   CBC    Component Value Date/Time   WBC 18.6 (H) 04/01/2023 1610   RBC 3.06 (L) 04/01/2023 1610   HGB 8.4 (L) 04/01/2023 1610   HCT 25.8 (L) 04/01/2023 1610   PLT 110 (L) 04/01/2023 1610   MCV 84.3 04/01/2023 1610   MCH 27.5 04/01/2023 1610   MCHC 32.6 04/01/2023 1610   RDW 16.2 (H) 04/01/2023 1610   Replace K+  Keep NPO until swallow eval done.

## 2023-04-01 NOTE — Procedures (Signed)
Extubation Procedure Note  Patient Details:   Name: Dale Mills DOB: 01-Aug-1965 MRN: [id number]   Airway Documentation:    Vent end date: 04/01/23 Vent end time: 1425   Evaluation  O2 sats: stable throughout Complications: No apparent complications Patient did tolerate procedure well. Bilateral Breath Sounds: Clear, Diminished   Yes  Pt extubated to 4L [place], pt tolerated well. Cuff leak positive, No stridor noted,RN at bedside, CCM aware. RT will monitor as needed.   Thornell Mule 04/01/2023, 5:32 PM

## 2023-04-01 NOTE — Progress Notes (Signed)
1 Day Post-Op Procedure(s) (LRB): MITRAL VALVE (MV) REPLACEMENT UTILIZING SIZE 33 MOSAIC PORCINE HEART VALVE (N/A) ASCENDING AORTIC ROOT REPLACEMENT UTILIZING 27 KONECT RESILIA  AORTIC VALVE CONDUIT (N/A) TRANSESOPHAGEAL ECHOCARDIOGRAM (N/A) Subjective:  Hemodynamics stable overnight. On 20 mcg NE. CI 2.3, PA 22/11, CVP 4.  Remains sedated on vent but moving all ext.  Objective: Vital signs in last 24 hours: Temp:  [97.2 F (36.2 C)-99.9 F (37.7 C)] 98.4 F (36.9 C) (09/21 0715) Pulse Rate:  [86-127] 89 (09/21 0715) Cardiac Rhythm: Sinus tachycardia (09/20 2000) Resp:  [4-27] 26 (09/21 0715) BP: (60-123)/(31-89) 93/65 (09/21 0700) SpO2:  [88 %-100 %] 99 % (09/21 0715) Arterial Line BP: (72-123)/(40-77) 108/57 (09/21 0715) FiO2 (%):  [40 %-100 %] 40 % (09/21 0400) Weight:  [113.6 kg] 113.6 kg (09/21 0245)  Hemodynamic parameters for last 24 hours: PAP: (22-40)/(11-25) 22/11 CVP:  [0 mmHg-28 mmHg] 4 mmHg CO:  [2.7 L/min-4.9 L/min] 4.3 L/min CI:  [1.19 L/min/m2-2.11 L/min/m2] 1.87 L/min/m2  Intake/Output from previous day: 09/20 0701 - 09/21 0700 In: 7862.2 [I.V.:3875.8; Blood:1215.3; NG/GT:910; IV Piggyback:1861] Out: 5555 [Urine:2465; Blood:1800; Chest Tube:1290] Intake/Output this shift: No intake/output data recorded.  General appearance: sedated on vent Neurologic: moving all extremities. Heart: regular rate and rhythm, S1, S2 normal, no murmur Lungs: clear to auscultation bilaterally Abdomen: soft, non-tender; hypoactive bowel sounds  Wound: chest dressing dry  Lab Results: Recent Labs    03/31/23 1956 03/31/23 2002 04/01/23 0506 04/01/23 0508  WBC 35.6*  --   --  24.6*  HGB 9.0*   < > 7.1* 7.3*  HCT 28.8*   < > 21.0* 22.6*  PLT 170  --   --  155   < > = values in this interval not displayed.   BMET:  Recent Labs    03/31/23 1956 03/31/23 2002 04/01/23 0506 04/01/23 0508  NA 144   < > 147* 144  K 4.6   < > 4.1 4.0  CL 110  --   --  110  CO2 23   --   --  22  GLUCOSE 218*  --   --  187*  BUN 56*  --   --  50*  CREATININE 1.44*  --   --  1.43*  CALCIUM 8.6*  --   --  8.6*   < > = values in this interval not displayed.    PT/INR:  Recent Labs    03/31/23 1956  LABPROT 18.0*  INR 1.5*   ABG    Component Value Date/Time   PHART 7.381 04/01/2023 0506   HCO3 22.8 04/01/2023 0506   TCO2 24 04/01/2023 0506   ACIDBASEDEF 2.0 04/01/2023 0506   O2SAT 99 04/01/2023 0506   CBG (last 3)  Recent Labs    03/31/23 1908 04/01/23 0606 04/01/23 0653  GLUCAP 196* 180* 138*   CXR: clear  Assessment/Plan: S/P Procedure(s) (LRB): MITRAL VALVE (MV) REPLACEMENT UTILIZING SIZE 33 MOSAIC PORCINE HEART VALVE (N/A) ASCENDING AORTIC ROOT REPLACEMENT UTILIZING 27 KONECT RESILIA  AORTIC VALVE CONDUIT (N/A) TRANSESOPHAGEAL ECHOCARDIOGRAM (N/A)  POD 1  Mitral and aortic valve endocarditis. G+ cocci on GS of mitral and aortic valve leaflets. Continue antibiotics.  Hemodynamics stable on NE. Wean as tolerated.  Acute blood loss anemia: transfuse 2 units this am and start diuresis.  Vent wean per CCM.   Preop embolic stroke to right PCA territory occipital lobe with additional smaller acute infarcts to bilat parietal lobes and cerebellum. Small acute SA hemorrhage.  No tube feeds  for now.  Keep chest tubes, swan, arterial line.   LOS: 4 days    Alleen Borne 04/01/2023

## 2023-04-01 NOTE — Progress Notes (Signed)
NAME:  MARYANN CEBALLO, MRN:  [id number], DOB:  [birth year], LOS: 4 ADMISSION DATE:  03/28/2023, CONSULTATION DATE:  9/17 REFERRING MD:  August Saucer, CHIEF COMPLAINT:  endocarditis    History of Present Illness:  57 year old male patient with history as mentioned below which includes hypertension, coronary artery disease, recurrent urinary tract infections and pyelonephritis, and recent left hip arthroplasty with surgery completed back in April 2024.  Presents to the emergency room with chief complaint of nausea, lightheadedness, generalized weakness, fever and chills.  Presented from work on 9/16 with the above symptoms, initially found to be slightly hypotensive with systolic blood pressure 95 heart rate 92 white blood cell count of 20.4 hemoglobin 10.9 BUN was 39 and serum creatinine 2.6, baseline 0.6.  His BNP was 2880, UA was concerning for possible UTI chest x-ray was clear CT and abdomen was negative for acute findings a right upper quadrant ultrasound was obtained which showed cholelithiasis but no evidence of cholecystitis his heart rate continue to elevate while in the emergency room noted to be in sinus tachycardia heart rate 110s to 120s blood pressure remained in the soft 90s systolic cultures were sent he was administered IV crystalloid and 1 during resuscitation effort required increasing supplemental oxygen with progressive increased work of breathing and follow-up chest x-ray suggesting flash pulmonary edema.  He was unable to tolerate noninvasive ventilation and therefore was intubated.  His Tmax while in the emergency room was 105.8 his blood pressure at its lowest dropped to the 80s systolic.  He was started on dobutamine infusion and broad-spectrum antibiotics have been started as well working diagnosis at time of initial evaluation was sepsis/septic shock felt secondary to urinary tract source complicated by AKI, non-STEMI, and flash pulmonary edema.  He was admitted to the intensive care,  antibiotics were continued as were resuscitation efforts and biotics.  Blood cultures were positive for GPC in pairs and chains with PCR detecting e faecalis.  Transthoracic echocardiogram suggesting endocarditis because of these findings he was transferred to [facility] for definitive care  Pertinent  Medical History  Recent Left hip replacement, pyelonephritis, hypertension, depression, anxiety, coronary artery disease, prior cardiac catheterization, prior hernia repair.  Last echocardiogram in July 2024 showed EF 55 to 60%, recurrent UTIs.  Discussed this  Significant Hospital Events: Including procedures, antibiotic start and stop dates in addition to other pertinent events    9/16 admitted with septic shock initially felt urinary tract source, abx started 9/16 through 9/17 cultures positive for GPC in pairs and clusters, PCR suggesting E facialis, echocardiogram suggesting mitral valve endocarditis, transferred to [facility] TEE obtained at bedside and ccm admitting  9/20 PROCEDURES:   MITRAL VALVE (MV) REPLACEMENT  ASCENDING AORTIC ROOT REPLACEMENT UTILIZING 27 KONECT RESILIA AORTIC VALVE CONDUIT (N/A) TRANSESOPHAGEAL ECHOCARDIOGRAM (N/A)  Interim History / Subjective:  No events. Remains on some pressors. Gtt levophed 18 mcg/min, insulin, prop, fent  Objective   Blood pressure 115/61, pulse 88, temperature 98.4 F (36.9 C), temperature source Core, resp. rate (!) 26, weight 113.6 kg, SpO2 99%. PAP: (22-40)/(11-25) 22/11 CVP:  [0 mmHg-28 mmHg] 4 mmHg CO:  [2.7 L/min-4.9 L/min] 4.3 L/min CI:  [1.19 L/min/m2-2.11 L/min/m2] 1.87 L/min/m2  Vent Mode: PRVC FiO2 (%):  [40 %-100 %] 40 % Set Rate:  [22 bmp-26 bmp] 26 bmp Vt Set:  [600 mL] 600 mL PEEP:  [5 cmH20-10 cmH20] 5 cmH20 Plateau Pressure:  [18 cmH20-22 cmH20] 22 cmH20   Intake/Output Summary (Last 24 hours) at 04/01/2023 1121 Last data  filed at 04/01/2023 0700 Gross per 24 hour  Intake 7862.17 ml  Output 4535 ml  Net 3327.17 ml    Filed Weights   03/30/23 0349 03/31/23 0430 04/01/23 0245  Weight: 111.8 kg 112.1 kg 113.6 kg    Examination: No distress Pupils equal Starting to move purposefully Drains and sternotomy site look okay Ext warm  BMP ok ABLA noted, getting more pRBC CXR looks improved  Resolved Hospital Problem list     Assessment & Plan:   Septic shock secondary to  E Facialis bacteremia and AV/MV endocarditis, baseline aortic root dilation- s/p MVR + aortic root replacement 03/31/23 Acute hypoxic resp failure 2/2 pulmonary edema and possible element of ALI Acute renal failure secondary to septic shock Acute metabolic encephalopathy 2/2 sepsis and multifocal CVA 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; bilateral small ceribellar/parietal, R PCA larger infarct Chronic anemia  H/o CAD w/ elevated trop I presumed type 2 NSTEMI HLD HTN Hyperglycemia Hx AAA  - Drain management per TCTS - Pressors for MAP 65 - Balanced transfusion and diuresis strategy - Vent bundle, start weaning sedation today - PAD protocol - Prolonged abx as outlined by ID  Best Practice (right click and "Reselect all SmartList Selections" daily)   Diet/type: NPO w/ meds via tube; start TF if unable to wean from vent DVT prophylaxis: SCD GI prophylaxis: H2B Lines: Central line Foley:  Yes, and it is still needed Code Status:  full code Last date of multidisciplinary goals of care discussion [pending]  32 min cc time Myrla Halsted MD PCCM [facility] 04/01/2023 11:21 AM

## 2023-04-02 ENCOUNTER — Inpatient Hospital Stay (HOSPITAL_COMMUNITY): Payer: Medicaid Other

## 2023-04-02 DIAGNOSIS — R0602 Shortness of breath: Secondary | ICD-10-CM

## 2023-04-02 LAB — BPAM RBC
Blood Product Expiration Date: 202410192359
Blood Product Expiration Date: 202410202359
ISSUE DATE / TIME: 202409201547
ISSUE DATE / TIME: 202410192359
ISSUE DATE / TIME: 202410202359
Unit Type and Rh: 5100
Unit Type and Rh: 5100
Unit Type and Rh: 5100
Unit Type and Rh: 5100
Unit Type and Rh: 5100
Unit Type and Rh: 5100
Unit Type and Rh: 5100
Unit Type and Rh: 5100
Unit Type and Rh: 5100
Unit Type and Rh: 5100

## 2023-04-02 LAB — GLUCOSE, CAPILLARY
Glucose-Capillary: 123 mg/dL — ABNORMAL HIGH (ref 70–99)
Glucose-Capillary: 110 mg/dL — ABNORMAL HIGH (ref 70–99)
Glucose-Capillary: 116 mg/dL — ABNORMAL HIGH (ref 70–99)
Glucose-Capillary: 95 mg/dL (ref 70–99)
Glucose-Capillary: 89 mg/dL (ref 70–99)
Glucose-Capillary: 77 mg/dL (ref 70–99)

## 2023-04-02 LAB — TYPE AND SCREEN
ABO/RH(D): O POS
Antibody Screen: NEGATIVE
Unit division: 0
Unit division: 0
Unit division: 0
Unit division: 0
Unit division: 0

## 2023-04-02 LAB — ACID FAST SMEAR (AFB, MYCOBACTERIA): Acid Fast Smear: NEGATIVE

## 2023-04-02 LAB — CBC
HCT: 24.8 % — ABNORMAL LOW (ref 39.0–52.0)
Hemoglobin: 8.1 g/dL — ABNORMAL LOW (ref 13.0–17.0)
MCH: 28.5 pg (ref 26.0–34.0)
MCHC: 32.7 g/dL (ref 30.0–36.0)
MCV: 87.3 fL (ref 80.0–100.0)
Platelets: 121 10*3/uL — ABNORMAL LOW (ref 150–400)
RBC: 2.84 MIL/uL — ABNORMAL LOW (ref 4.22–5.81)
RDW: 16.6 % — ABNORMAL HIGH (ref 11.5–15.5)
WBC: 17.7 10*3/uL — ABNORMAL HIGH (ref 4.0–10.5)
nRBC: 1.2 % — ABNORMAL HIGH (ref 0.0–0.2)

## 2023-04-02 LAB — BASIC METABOLIC PANEL
Anion gap: 9 (ref 5–15)
BUN: 33 mg/dL — ABNORMAL HIGH (ref 6–20)
CO2: 26 mmol/L (ref 22–32)
Calcium: 8.9 mg/dL (ref 8.9–10.3)
Chloride: 111 mmol/L (ref 98–111)
Creatinine, Ser: 1.03 mg/dL (ref 0.61–1.24)
GFR, Estimated: >60 mL/min (ref >60)
Glucose, Bld: 124 mg/dL — ABNORMAL HIGH (ref 70–99)
Potassium: 3.7 mmol/L (ref 3.5–5.1)
Sodium: 146 mmol/L — ABNORMAL HIGH (ref 135–145)

## 2023-04-02 MED ORDER — EZETIMIBE 10 MG PO TABS
10.0000 mg | ORAL_TABLET | Freq: Every day | ORAL | Status: DC
  Administered 2023-04-03 – 2023-04-09 (×7): 10 mg via ORAL
  Filled 2023-04-02 (×7): qty 1

## 2023-04-02 MED ORDER — ACETAMINOPHEN 500 MG PO TABS
1000.0000 mg | ORAL_TABLET | Freq: Four times a day (QID) | ORAL | Status: DC
  Administered 2023-04-02 – 2023-04-03 (×4): 1000 mg via ORAL
  Filled 2023-04-02 (×4): qty 2

## 2023-04-02 MED ORDER — FAMOTIDINE 20 MG PO TABS
20.0000 mg | ORAL_TABLET | Freq: Two times a day (BID) | ORAL | Status: DC
  Administered 2023-04-02 – 2023-04-03 (×2): 20 mg via ORAL
  Filled 2023-04-02 (×2): qty 1

## 2023-04-02 MED ORDER — INSULIN DETEMIR 100 UNIT/ML ~~LOC~~ SOLN
20.0000 [IU] | Freq: Every day | SUBCUTANEOUS | Status: DC
  Administered 2023-04-02: 20 [IU] via SUBCUTANEOUS
  Filled 2023-04-02 (×2): qty 0.2

## 2023-04-02 MED ORDER — ACETAMINOPHEN 160 MG/5ML PO SOLN: 1000.0000 mg | Freq: Four times a day (QID) | ORAL | Status: DC

## 2023-04-02 MED ORDER — ACETAMINOPHEN 325 MG PO TABS: 650.0000 mg | ORAL_TABLET | ORAL | Status: DC | PRN

## 2023-04-02 MED ORDER — POTASSIUM CHLORIDE 10 MEQ/50ML IV SOLN
10.0000 meq | INTRAVENOUS | Status: AC
  Administered 2023-04-02 (×3): 10 meq via INTRAVENOUS
  Filled 2023-04-02: qty 50

## 2023-04-02 MED ORDER — PANTOPRAZOLE SODIUM 40 MG IV SOLR
40.0000 mg | INTRAVENOUS | Status: DC
  Administered 2023-04-02: 40 mg via INTRAVENOUS
  Filled 2023-04-02: qty 10

## 2023-04-02 MED ORDER — ENOXAPARIN SODIUM 40 MG/0.4ML IJ SOSY
40.0000 mg | PREFILLED_SYRINGE | INTRAMUSCULAR | Status: DC
  Administered 2023-04-02 – 2023-04-04 (×3): 40 mg via SUBCUTANEOUS
  Filled 2023-04-02 (×3): qty 0.4

## 2023-04-02 MED ORDER — PANTOPRAZOLE SODIUM 40 MG PO TBEC
40.0000 mg | DELAYED_RELEASE_TABLET | Freq: Every day | ORAL | Status: DC
  Administered 2023-04-03: 40 mg via ORAL
  Filled 2023-04-02: qty 1

## 2023-04-02 MED ORDER — ASPIRIN 325 MG PO TABS
325.0000 mg | ORAL_TABLET | Freq: Once | ORAL | Status: AC
  Administered 2023-04-02: 325 mg via ORAL
  Filled 2023-04-02: qty 1

## 2023-04-02 MED ORDER — ATORVASTATIN CALCIUM 80 MG PO TABS
80.0000 mg | ORAL_TABLET | Freq: Every day | ORAL | Status: DC
  Administered 2023-04-03 – 2023-04-09 (×7): 80 mg via ORAL
  Filled 2023-04-02 (×7): qty 1

## 2023-04-02 MED ORDER — FUROSEMIDE 10 MG/ML IJ SOLN
40.0000 mg | Freq: Two times a day (BID) | INTRAMUSCULAR | Status: AC
  Administered 2023-04-02 (×2): 40 mg via INTRAVENOUS
  Filled 2023-04-02 (×2): qty 4

## 2023-04-02 MED ORDER — POTASSIUM CHLORIDE 10 MEQ/50ML IV SOLN
10.0000 meq | INTRAVENOUS | Status: AC
  Administered 2023-04-02 (×3): 10 meq via INTRAVENOUS
  Filled 2023-04-02 (×3): qty 50

## 2023-04-02 NOTE — Progress Notes (Signed)
NAME:  Dale Mills, MRN:  [id number], DOB:  14-Jun-1966, LOS: 5 ADMISSION DATE:  03/28/2023, CONSULTATION DATE:  9/17 REFERRING MD:  August Saucer, CHIEF COMPLAINT:  endocarditis    History of Present Illness:  57 year old male patient with history as mentioned below which includes hypertension, coronary artery disease, recurrent urinary tract infections and pyelonephritis, and recent left hip arthroplasty with surgery completed back in April 2024.  Presents to the emergency room with chief complaint of nausea, lightheadedness, generalized weakness, fever and chills.  Presented from work on 9/16 with the above symptoms, initially found to be slightly hypotensive with systolic blood pressure 95 heart rate 92 white blood cell count of 20.4 hemoglobin 10.9 BUN was 39 and serum creatinine 2.6, baseline 0.6.  His BNP was 2880, UA was concerning for possible UTI chest x-ray was clear CT and abdomen was negative for acute findings a right upper quadrant ultrasound was obtained which showed cholelithiasis but no evidence of cholecystitis his heart rate continue to elevate while in the emergency room noted to be in sinus tachycardia heart rate 110s to 120s blood pressure remained in the soft 90s systolic cultures were sent he was administered IV crystalloid and 1 during resuscitation effort required increasing supplemental oxygen with progressive increased work of breathing and follow-up chest x-ray suggesting flash pulmonary edema.  He was unable to tolerate noninvasive ventilation and therefore was intubated.  His Tmax while in the emergency room was 105.8 his blood pressure at its lowest dropped to the 80s systolic.  He was started on dobutamine infusion and broad-spectrum antibiotics have been started as well working diagnosis at time of initial evaluation was sepsis/septic shock felt secondary to urinary tract source complicated by AKI, non-STEMI, and flash pulmonary edema.  He was admitted to the intensive care,  antibiotics were continued as were resuscitation efforts and biotics.  Blood cultures were positive for GPC in pairs and chains with PCR detecting e faecalis.  Transthoracic echocardiogram suggesting endocarditis because of these findings he was transferred to [facility] for definitive care  Pertinent  Medical History  Recent Left hip replacement, pyelonephritis, hypertension, depression, anxiety, coronary artery disease, prior cardiac catheterization, prior hernia repair.  Last echocardiogram in July 2024 showed EF 55 to 60%, recurrent UTIs.  Discussed this  Significant Hospital Events: Including procedures, antibiotic start and stop dates in addition to other pertinent events    9/16 admitted with septic shock initially felt urinary tract source, abx started 9/16 through 9/17 cultures positive for GPC in pairs and clusters, PCR suggesting E facialis, echocardiogram suggesting mitral valve endocarditis, transferred to [facility] TEE obtained at bedside and ccm admitting  9/20 PROCEDURES:   MITRAL VALVE (MV) REPLACEMENT  ASCENDING AORTIC ROOT REPLACEMENT UTILIZING 27 KONECT RESILIA AORTIC VALVE CONDUIT (N/A) TRANSESOPHAGEAL ECHOCARDIOGRAM (N/A)  Interim History / Subjective:  Extubated Off pressors Some mild pain and thirst  Objective   Blood pressure 111/81, pulse 95, temperature 100.1 F (37.8 C), resp. rate 17, weight 114.8 kg, SpO2 96%. PAP: (17-27)/(8-13) 17/8 CVP:  [1 mmHg-8 mmHg] 1 mmHg  Vent Mode: PSV;CPAP FiO2 (%):  [4 %-40 %] 4 % PEEP:  [5 cmH20] 5 cmH20 Pressure Support:  [10 cmH20] 10 cmH20   Intake/Output Summary (Last 24 hours) at 04/02/2023 0842 Last data filed at 04/02/2023 0813 Gross per 24 hour  Intake 2775.49 ml  Output 4105 ml  Net -1329.51 ml   Filed Weights   03/31/23 0430 04/01/23 0245 04/02/23 0500  Weight: 112.1 kg 113.6 kg 114.8 kg  Examination: No distress MM dry Facial droop noted R>L weakness Mediastinal drains in place  Patient  Lines/Drains/Airways Status     Active Line/Drains/Airways     Name Placement date Placement time Site Days   Arterial Line 03/31/23 Right Brachial 03/31/23  0733  Brachial  2   Peripheral IV 03/28/23 20 G Anterior;Left Forearm 03/28/23  1400  Forearm  5   CVC Triple Lumen 03/28/23 Right Internal jugular 03/28/23  1400  -- 5   Urethral Catheter 03/28/23  1400  --  5   Y Chest Tube 1, 2, and 3 1 Medial Mediastinal 36 Fr. 2 Medial Mediastinal 32 Fr. 3 Medial Mediastinal 32 Fr. 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; bilateral small ceribellar/parietal, R PCA larger infarct Chronic anemia  H/o CAD w/ elevated trop I presumed type 2 NSTEMI HLD HTN Hyperglycemia- SSI Hx AAA  - Drain management per TCTS - Prolonged abx as outlined by ID - Start diuresis - PT/OT/SLP to see; GDMT per neuro/TCTS - De-lining per TCTS - Will follow while in ICU  Best Practice (right click and "Reselect all SmartList Selections" daily)   Diet/type: NPO w/ meds via tube; start TF if unable to wean from vent DVT prophylaxis: lovenox GI prophylaxis: H2B Lines: Central line Foley:  Yes, and it is still needed Code Status:  full code Last date of multidisciplinary goals of care discussion [updated bedside 04/02/23]  Myrla Halsted MD PCCM [place] [facility] 04/02/2023 8:42 AM

## 2023-04-02 NOTE — Progress Notes (Addendum)
2 Days Post-Op Procedure(s) (LRB): MITRAL VALVE (MV) REPLACEMENT UTILIZING SIZE 33 MOSAIC PORCINE HEART VALVE (N/A) ASCENDING AORTIC ROOT REPLACEMENT UTILIZING 27 KONECT RESILIA  AORTIC VALVE CONDUIT (N/A) TRANSESOPHAGEAL ECHOCARDIOGRAM (N/A) Subjective:  Thirsty.  Pain control good.  Objective: Vital signs in last 24 hours: Temp:  [98.2 F (36.8 C)-100.1 F (37.8 C)] 100.1 F (37.8 C) (09/22 0300) Pulse Rate:  [81-104] 95 (09/22 0700) Cardiac Rhythm: Normal sinus rhythm (09/21 2100) Resp:  [10-26] 17 (09/22 0700) BP: (96-121)/(61-84) 111/81 (09/22 0700) SpO2:  [93 %-100 %] 96 % (09/22 0700) Arterial Line BP: (70-134)/(49-74) 123/73 (09/22 0700) FiO2 (%):  [4 %-40 %] 4 % (09/21 1500) Weight:  [114.8 kg] 114.8 kg (09/22 0500)  Hemodynamic parameters for last 24 hours: PAP: (17-27)/(8-13) 17/8 CVP:  [1 mmHg-8 mmHg] 1 mmHg  Intake/Output from previous day: 09/21 0701 - 09/22 0700 In: 2882.9 [I.V.:1112.8; Blood:660; NG/GT:230; IV Piggyback:880.1] Out: 4080 [Urine:3630; Chest Tube:450] Intake/Output this shift: Total I/O In: 59.8 [I.V.:10; IV Piggyback:49.8] Out: -   General appearance: alert and cooperative Neurologic: good strength in all ext. Denies visual changes.  Heart: regular rate and rhythm, S1, S2 normal, no murmur Lungs: clear to auscultation bilaterally Abdomen: soft, non-tender; bowel sounds normal Extremities: edema moderate Wound: chest incision ok CT's 310 cc last shift, 200 cc this am after turning ( old dark thin)  Lab Results: Recent Labs    04/01/23 1610 04/02/23 0241  WBC 18.6* 17.7*  HGB 8.4* 8.1*  HCT 25.8* 24.8*  PLT 110* 121*   BMET:  Recent Labs    04/01/23 1610 04/02/23 0241  NA 147* 146*  K 3.4* 3.7  CL 113* 111  CO2 24 26  GLUCOSE 101* 124*  BUN 40* 33*  CREATININE 1.12 1.03  CALCIUM 8.6* 8.9    PT/INR:  Recent Labs    03/31/23 1956  LABPROT 18.0*  INR 1.5*   ABG    Component Value Date/Time   PHART 7.371  04/01/2023 1401   HCO3 23.1 04/01/2023 1401   TCO2 24 04/01/2023 1401   ACIDBASEDEF 2.0 04/01/2023 1401   O2SAT 98 04/01/2023 1401   CBG (last 3)  Recent Labs    04/01/23 1957 04/01/23 2318 04/02/23 0248  GLUCAP 124* 117* 123*   CXR: clear  Assessment/Plan: S/P Procedure(s) (LRB): MITRAL VALVE (MV) REPLACEMENT UTILIZING SIZE 33 MOSAIC PORCINE HEART VALVE (N/A) ASCENDING AORTIC ROOT REPLACEMENT UTILIZING 27 KONECT RESILIA  AORTIC VALVE CONDUIT (N/A) TRANSESOPHAGEAL ECHOCARDIOGRAM (N/A)  POD 2  Hemodynamically stable in sinus rhythm.   -1200 cc yesterday but weight increased. Will continue diuresis today and replace K+  Keep chest tubes in this am and get up to chair. Will remove later if output remains low.  DC left neck sleeve. Keep right neck line in. Keep foley since diuresing.  ST eval of swallowing before starting po.  PT/OT.  Continue antibiotics for endocarditis. Operative cultures pending. Will need PICC at some point.  Start Lovenox for DVT prophylaxis. He has SCD's on.  LOS: 5 days    Alleen Borne 04/02/2023

## 2023-04-02 NOTE — Evaluation (Signed)
Clinical/Bedside Swallow Evaluation Patient Details  Name: Dale Mills MRN: [id number] Date of Birth: 1965-09-04  Today's Date: 04/02/2023 Time: SLP Start Time (ACUTE ONLY): 0902 SLP Stop Time (ACUTE ONLY): 0914 SLP Time Calculation (min) (ACUTE ONLY): 12 min  Past Medical History:  Past Medical History:  Diagnosis Date   Angina pectoris (HCC) [place] classification 2 04/10/2020   Anxiety    Arthritis    Coronary artery disease coronary CT angio did not show any major issues, suspicion for distal disease 06/12/2020   Depression    DJD (degenerative joint disease)    Dyslipidemia 02/13/2020   Essential hypertension 02/13/2020   Precordial chest pain 02/13/2020   Past Surgical History:  Past Surgical History:  Procedure Laterality Date   ASCENDING AORTIC ROOT REPLACEMENT N/A 03/31/2023   Procedure: ASCENDING AORTIC ROOT REPLACEMENT UTILIZING 27 KONECT RESILIA  AORTIC VALVE CONDUIT;  Surgeon: Eugenio Hoes, MD;  Location: [facility] OR;  Service: Open Heart Surgery;  Laterality: N/A;   COLONOSCOPY W/ POLYPECTOMY     FOREARM SURGERY     MASS REMOVED   HIP ARTHROSCOPY Left    LASIK Bilateral    MITRAL VALVE REPLACEMENT N/A 03/31/2023   Procedure: MITRAL VALVE (MV) REPLACEMENT UTILIZING SIZE 33 MOSAIC PORCINE HEART VALVE;  Surgeon: Eugenio Hoes, MD;  Location: [facility] OR;  Service: Open Heart Surgery;  Laterality: N/A;   TEE WITHOUT CARDIOVERSION N/A 03/31/2023   Procedure: TRANSESOPHAGEAL ECHOCARDIOGRAM;  Surgeon: Eugenio Hoes, MD;  Location: [facility] OR;  Service: Open Heart Surgery;  Laterality: N/A;   TONSILECTOMY, ADENOIDECTOMY, BILATERAL MYRINGOTOMY AND TUBES  2011   UMBILICAL HERNIA REPAIR     VASECTOMY     HPI:  57 y.o. 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 MV replacement + aortic root replacement  9/20; MRI 9/18: multifocal CVA-  bilateral  small ceribellar/parietal, R PCA larger infarct. PMH includes CAD, anxiety, arthritis, dyslipidemia, HTN, recent hip arthoplasty 4/24.    Assessment / Plan / Recommendation  Clinical Impression  Dale Mills presents with functional swallowing s/p acute CVA and four-day intubation.  He was eager to eat. Oral mechanism exam suggests left focal CN VII deficits; tongue midline with decreased effort when lateralizing. Right gaze preference; flat affect; confusion/disorientation noted.  Pt demonstrated adequate oral manipulation of trial boluses, solid and thin.  Swallow was palpable. There were no s/s of aspiration during consumption of thin water, applesauce, and crackers. Pt acknowledges minimal appetite. RN reports pt endorsing nausea after sips of water.  Recommend starting full liquid diet for now after discussion with nurse.  Upright for PO intake; assist with self-feeding. SLP will follow; pt will need speech/language/cognitive assessment given new neuro dx. SLP Visit Diagnosis: Dysphagia, unspecified (R13.10)    Aspiration Risk  No limitations    Diet Recommendation   Other (Comment) (full liquids)  Medication Administration: Whole meds with puree    Other  Recommendations Oral Care Recommendations: Oral care BID    Recommendations for follow up therapy are one component of a multi-disciplinary discharge planning process, led by the attending physician.  Recommendations may be updated based on patient status, additional functional criteria and insurance authorization.  Follow up Recommendations Other (comment) (tba)      Assistance Recommended at Discharge  tba  Functional Status Assessment Patient has had a recent decline in their functional status and demonstrates the ability to make significant improvements in function in a reasonable  and predictable amount of time.  Frequency and Duration min 2x/week  2 weeks       Prognosis Prognosis for improved oropharyngeal function:  Good Barriers to Reach Goals: Cognitive deficits      Swallow Study   General Date of Onset: 03/28/23 HPI: 57 y.o. 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 MV replacement + aortic root replacement  9/20; MRI 9/18: multifocal CVA-  bilateral small ceribellar/parietal, R PCA larger infarct. PMH includes CAD, anxiety, arthritis, dyslipidemia, HTN, recent hip arthoplasty 4/24. Type of Study: Bedside Swallow Evaluation Previous Swallow Assessment: no Diet Prior to this Study: NPO Temperature Spikes Noted: No Respiratory Status: Nasal cannula History of Recent Intubation: Yes Total duration of intubation (days): 4 days Date extubated: 04/01/23 Behavior/Cognition: Alert;Confused Oral Cavity Assessment: Within Functional Limits Oral Care Completed by SLP: Recent completion by staff Oral Cavity - Dentition: Adequate natural dentition Self-Feeding Abilities: Needs assist Patient Positioning: Partially reclined Baseline Vocal Quality: Normal Volitional Cough: Strong Volitional Swallow: Able to elicit    Oral/Motor/Sensory Function Overall Oral Motor/Sensory Function: Mild impairment Facial ROM: Reduced left;Suspected CN VII (facial) dysfunction Facial Symmetry: Abnormal symmetry left;Suspected CN VII (facial) dysfunction Facial Sensation: Within Functional Limits Lingual ROM: Within Functional Limits Lingual Symmetry: Within Functional Limits Lingual Strength: Reduced Mandible: Within Functional Limits   Ice Chips Ice chips: Within functional limits   Thin Liquid Thin Liquid: Within functional limits    Nectar Thick Nectar Thick Liquid: Not tested   Honey Thick Honey Thick Liquid: Not tested   Puree Puree: Within functional limits   Solid     Solid: Within functional limits      Blenda Mounts Dale Mills 04/02/2023,9:26 AM  Marchelle Folks L.  Samson Frederic, MA CCC/SLP Clinical Specialist - Acute Care SLP Acute Rehabilitation Services Office number [phone]

## 2023-04-02 NOTE — Progress Notes (Signed)
Patient ID: Dale Mills, male   DOB: October 23, 1965, 57 y.o.   MRN: [id number] TCTS Evening Rounds:  Hemodynamically stable   -1200 cc so far today.  Low grade fever 100.2. needs to work on IS.

## 2023-04-03 ENCOUNTER — Other Ambulatory Visit: Payer: Self-pay

## 2023-04-03 ENCOUNTER — Inpatient Hospital Stay (HOSPITAL_COMMUNITY): Payer: Medicaid Other

## 2023-04-03 DIAGNOSIS — I76 Septic arterial embolism: Secondary | ICD-10-CM

## 2023-04-03 DIAGNOSIS — I358 Other nonrheumatic aortic valve disorders: Secondary | ICD-10-CM

## 2023-04-03 DIAGNOSIS — Z952 Presence of prosthetic heart valve: Secondary | ICD-10-CM

## 2023-04-03 DIAGNOSIS — I059 Rheumatic mitral valve disease, unspecified: Secondary | ICD-10-CM

## 2023-04-03 LAB — BASIC METABOLIC PANEL
Anion gap: 7 (ref 5–15)
BUN: 20 mg/dL (ref 6–20)
CO2: 29 mmol/L (ref 22–32)
Calcium: 9 mg/dL (ref 8.9–10.3)
Chloride: 109 mmol/L (ref 98–111)
Creatinine, Ser: 0.84 mg/dL (ref 0.61–1.24)
GFR, Estimated: >60 mL/min (ref >60)
Glucose, Bld: 92 mg/dL (ref 70–99)
Potassium: 4 mmol/L (ref 3.5–5.1)
Sodium: 145 mmol/L (ref 135–145)

## 2023-04-03 LAB — GLUCOSE, CAPILLARY
Glucose-Capillary: 116 mg/dL — ABNORMAL HIGH (ref 70–99)
Glucose-Capillary: 151 mg/dL — ABNORMAL HIGH (ref 70–99)
Glucose-Capillary: 93 mg/dL (ref 70–99)

## 2023-04-03 LAB — CULTURE, BLOOD (ROUTINE X 2)
Culture: NO GROWTH
Culture: NO GROWTH
Special Requests: ADEQUATE
Special Requests: ADEQUATE

## 2023-04-03 LAB — BPAM FFP
Blood Product Expiration Date: 202409222359
Blood Product Expiration Date: 202409222359
ISSUE DATE / TIME: 202409200958
ISSUE DATE / TIME: 202409200958
Unit Type and Rh: 6200
Unit Type and Rh: 6200

## 2023-04-03 LAB — PREPARE FRESH FROZEN PLASMA: Unit division: 0

## 2023-04-03 LAB — CBC
HCT: 26.6 % — ABNORMAL LOW (ref 39.0–52.0)
Hemoglobin: 8.2 g/dL — ABNORMAL LOW (ref 13.0–17.0)
MCH: 27.9 pg (ref 26.0–34.0)
MCHC: 30.8 g/dL (ref 30.0–36.0)
MCV: 90.5 fL (ref 80.0–100.0)
Platelets: 147 10*3/uL — ABNORMAL LOW (ref 150–400)
RBC: 2.94 MIL/uL — ABNORMAL LOW (ref 4.22–5.81)
RDW: 18.8 % — ABNORMAL HIGH (ref 11.5–15.5)
WBC: 18.9 10*3/uL — ABNORMAL HIGH (ref 4.0–10.5)
nRBC: 1 % — ABNORMAL HIGH (ref 0.0–0.2)

## 2023-04-03 LAB — ACID FAST SMEAR (AFB, MYCOBACTERIA): Acid Fast Smear: NEGATIVE

## 2023-04-03 LAB — SURGICAL PATHOLOGY

## 2023-04-03 MED ORDER — ACETAMINOPHEN 160 MG/5ML PO SOLN
1000.0000 mg | Freq: Four times a day (QID) | ORAL | Status: DC
  Administered 2023-04-03 – 2023-04-05 (×8): 1000 mg via ORAL
  Filled 2023-04-03 (×9): qty 40.6

## 2023-04-03 MED ORDER — AMIODARONE HCL IN DEXTROSE 360-4.14 MG/200ML-% IV SOLN
60.0000 mg/h | INTRAVENOUS | Status: DC
  Administered 2023-04-03 (×2): 60 mg/h via INTRAVENOUS
  Filled 2023-04-03 (×2): qty 200

## 2023-04-03 MED ORDER — FUROSEMIDE 10 MG/ML IJ SOLN
40.0000 mg | Freq: Two times a day (BID) | INTRAMUSCULAR | Status: AC
  Administered 2023-04-03 (×2): 40 mg via INTRAVENOUS
  Filled 2023-04-03 (×2): qty 4

## 2023-04-03 MED ORDER — POTASSIUM CHLORIDE 10 MEQ/50ML IV SOLN
10.0000 meq | INTRAVENOUS | Status: AC
  Administered 2023-04-03 (×2): 10 meq via INTRAVENOUS
  Filled 2023-04-03 (×2): qty 50

## 2023-04-03 MED ORDER — ASPIRIN 81 MG PO TBEC
81.0000 mg | DELAYED_RELEASE_TABLET | Freq: Every day | ORAL | Status: DC
  Administered 2023-04-03 – 2023-04-09 (×7): 81 mg via ORAL
  Filled 2023-04-03 (×7): qty 1

## 2023-04-03 MED ORDER — PANTOPRAZOLE SODIUM 40 MG IV SOLR
40.0000 mg | Freq: Every day | INTRAVENOUS | Status: DC
  Administered 2023-04-04 – 2023-04-07 (×4): 40 mg via INTRAVENOUS
  Filled 2023-04-03 (×4): qty 10

## 2023-04-03 MED ORDER — INSULIN DETEMIR 100 UNIT/ML ~~LOC~~ SOLN
12.0000 [IU] | Freq: Every day | SUBCUTANEOUS | Status: DC
  Administered 2023-04-03 – 2023-04-04 (×2): 12 [IU] via SUBCUTANEOUS
  Filled 2023-04-03 (×3): qty 0.12

## 2023-04-03 MED ORDER — POTASSIUM CHLORIDE CRYS ER 20 MEQ PO TBCR
20.0000 meq | EXTENDED_RELEASE_TABLET | ORAL | Status: AC
  Administered 2023-04-03 (×2): 20 meq via ORAL
  Filled 2023-04-03 (×2): qty 1

## 2023-04-03 MED ORDER — AMIODARONE HCL IN DEXTROSE 360-4.14 MG/200ML-% IV SOLN
30.0000 mg/h | INTRAVENOUS | Status: DC
  Administered 2023-04-03 – 2023-04-04 (×3): 30 mg/h via INTRAVENOUS
  Filled 2023-04-03 (×3): qty 200

## 2023-04-03 MED ORDER — AMIODARONE LOAD VIA INFUSION
150.0000 mg | Freq: Once | INTRAVENOUS | Status: AC
  Administered 2023-04-03: 150 mg via INTRAVENOUS
  Filled 2023-04-03: qty 83.34

## 2023-04-03 MED ORDER — ENSURE ENLIVE PO LIQD
237.0000 mL | Freq: Three times a day (TID) | ORAL | Status: DC
  Administered 2023-04-03 – 2023-04-09 (×14): 237 mL via ORAL

## 2023-04-03 MED FILL — Fentanyl Citrate Preservative Free (PF) Inj 2500 MCG/50ML: INTRAMUSCULAR | Qty: 50 | Status: AC

## 2023-04-03 MED FILL — Sodium Chloride IV Soln 0.9%: INTRAVENOUS | Qty: 250 | Status: AC

## 2023-04-03 NOTE — Progress Notes (Signed)
Inpatient Rehab Admissions Coordinator:  ? ?Per therapy recommendations,  patient was screened for CIR candidacy by Devaney Segers, MS, CCC-SLP. At this time, Pt. Appears to be a a potential candidate for CIR. I will place   order for rehab consult per protocol for full assessment. Please contact me any with questions. ? ?Trine Fread, MS, CCC-SLP ?Rehab Admissions Coordinator  ?[phone] (celll) ?[phone] (office) ? ?

## 2023-04-03 NOTE — Progress Notes (Signed)
[address]       [place] [address]             [phone]      3 Days Post-Op  Procedure(s) (LRB): MITRAL VALVE (MV) REPLACEMENT UTILIZING SIZE 33 MOSAIC PORCINE HEART VALVE (N/A) ASCENDING AORTIC ROOT REPLACEMENT UTILIZING 27 KONECT RESILIA  AORTIC VALVE CONDUIT (N/A) TRANSESOPHAGEAL ECHOCARDIOGRAM (N/A)   Total Length of Stay:  LOS: 6 days    SUBJECTIVE: Awake and wanting to eat breakfast No other complaints  Vitals:   04/03/23 0500 04/03/23 0600  BP: 121/71 115/85  Pulse: 89 (!) 104  Resp: (!) 23 18  Temp:    SpO2: 97% 96%    Intake/Output      09/22 0701 09/23 0700 09/23 0701 09/24 0700   P.O. 600    I.V. (mL/kg) 10 (0.1)    Blood     NG/GT     IV Piggyback 1001.4    Total Intake(mL/kg) 1611.4 (14.1)    Urine (mL/kg/hr) 3445 (1.3)    Stool 0    Chest Tube 490    Total Output 3935    Net -2323.6         Stool Occurrence 3 x        sodium chloride     amiodarone     Followed by   amiodarone     ampicillin (OMNIPEN) IV Stopped (04/03/23 0347)   cefTRIAXone (ROCEPHIN)  IV Stopped (04/02/23 2251)    CBC    Component Value Date/Time   WBC 18.9 (H) 04/03/2023 0321   RBC 2.94 (L) 04/03/2023 0321   HGB 8.2 (L) 04/03/2023 0321   HCT 26.6 (L) 04/03/2023 0321   PLT 147 (L) 04/03/2023 0321   MCV 90.5 04/03/2023 0321   MCH 27.9 04/03/2023 0321   MCHC 30.8 04/03/2023 0321   RDW 18.8 (H) 04/03/2023 0321   CMP     Component Value Date/Time   NA 145 04/03/2023 0321   K 4.0 04/03/2023 0321   CL 109 04/03/2023 0321   CO2 29 04/03/2023 0321   GLUCOSE 92 04/03/2023 0321   BUN 20 04/03/2023 0321   CREATININE 0.84 04/03/2023 0321   CREATININE 1.38 (H) 03/17/2023 0851   CALCIUM 9.0 04/03/2023 0321   PROT 5.7 (L) 03/29/2023 0304   PROT 6.4 11/13/2020 1330   ALBUMIN 2.0 (L) 03/29/2023 0304   ALBUMIN 4.0 11/13/2020 1330   AST 49 (H) 03/29/2023 0304   AST 23 03/17/2023 0851   ALT 29 03/29/2023 0304   ALT 27 03/17/2023 0851    ALKPHOS 104 03/29/2023 0304   BILITOT 2.2 (H) 03/29/2023 0304   BILITOT 1.0 03/17/2023 0851   GFRNONAA >60 04/03/2023 0321   GFRNONAA 60 (L) 03/17/2023 0851   ABG    Component Value Date/Time   PHART 7.371 04/01/2023 1401   PCO2ART 39.9 04/01/2023 1401   PO2ART 108 04/01/2023 1401   HCO3 23.1 04/01/2023 1401   TCO2 24 04/01/2023 1401   ACIDBASEDEF 2.0 04/01/2023 1401   O2SAT 98 04/01/2023 1401   CBG (last 3)  Recent Labs    04/02/23 1710 04/02/23 1954 04/02/23 2329  GLUCAP 95 89 77  EXAM Neuro: intact Lungs: rhonchorous Card:tachy Ext: warm   ASSESSMENT: POD 3 sp Bental/MVR Hemodynamics ok. Appears in afib. Will start amio drip and observe Pulmonary: on [place] oxygen; will wean. Cts with too much drainage leave in for now ID; WBC slightly increased.On antibiotics. Cultures positive from OR. Need  ID to comment cont present antibx Nutrition; passed swallow. On oral  Heme: blood loss anemia. Need to start coumadin when chest tubes out Deconditioned: needs PT and OT Renal: ok will continue diuretics   Eugenio Hoes, MD 04/03/2023

## 2023-04-03 NOTE — Progress Notes (Signed)
[facility]    Date of Admission:  03/28/2023   Total days of antibiotics 2 (Reset)   ID: Dale Mills is a 57 y.o. 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 OR tissue cx from 9/20 still + e.faecalis. complicated by septic CNS emboli Principal Problem:   Endocarditis Active Problems:   Acute respiratory failure (HCC)   Septic shock (HCC)   Enterococcal bacteremia   Protein-calorie malnutrition, severe   S/P aortic valve replacement and aortoplasty    Subjective: Afebrile, sleepy this afternoon. Easily opens eyes.Chest tubes still in place.   Tele - still in afib  Medications:    stroke: early stages of recovery book   Does not apply Once   acetaminophen  1,000 mg Oral Q6H   aspirin EC  81 mg Oral Daily   atorvastatin  80 mg Oral Daily   bisacodyl  10 mg Oral Daily   Or   bisacodyl  10 mg Rectal Daily   Chlorhexidine Gluconate Cloth  6 each Topical Daily   enoxaparin (LOVENOX) injection  40 mg Subcutaneous Q24H   ezetimibe  10 mg Oral Daily   famotidine  20 mg Oral BID   feeding supplement  237 mL Oral TID BM   furosemide  40 mg Intravenous BID   insulin aspart  2-6 Units Subcutaneous Q4H   insulin detemir  12 Units Subcutaneous Daily   pantoprazole  40 mg Oral Daily   sodium chloride flush  3 mL Intravenous Q12H   tamsulosin  0.4 mg Oral BID    Objective: Vital signs in last 24 hours: Temp:  [97.9 F (36.6 C)-99.2 F (37.3 C)] 97.9 F (36.6 C) (09/23 1137) Pulse Rate:  [76-104] 81 (09/23 1600) Resp:  [13-25] 13 (09/23 1600) BP: (99-130)/(69-97) 112/85 (09/23 1600) SpO2:  [94 %-99 %] 97 % (09/23 1600) Weight:  [831 kg] 114 kg (09/23 0500)  Physical Exam  Constitutional: He is oriented to person, place, and time. He appears well-developed and well-nourished. No distress.   HENT:  Mouth/Throat: Oropharynx is clear and moist. No oropharyngeal exudate.  Cardiovascular: Normal rate, regular rhythm and normal heart sounds. Exam reveals no gallop and no friction rub.  No murmur heard.  Pulmonary/Chest: Effort normal and breath sounds normal. No respiratory distress. He has no wheezes. Chest tube in place- serosanginous fluid Abdominal: Soft. Bowel sounds are normal. He exhibits no distension. There is no tenderness.  Lymphadenopathy:  He has no cervical adenopathy.  Neurological: He is alert and oriented to person, place, and time.  Skin: Skin is warm and dry. No rash noted. No erythema.  Psychiatric: He has a normal mood and affect. His behavior is normal.    Lab Results Recent Labs    04/02/23 0241 04/03/23 0321  WBC 17.7* 18.9*  HGB 8.1* 8.2*  HCT 24.8* 26.6*  NA 146* 145  K 3.7 4.0  CL 111 109  CO2 26 29  BUN 33* 20  CREATININE 1.03 0.84    Microbiology: 9/20 MV= moderate e.faecalis 9/20 AV = few e.faecalis Studies/Results: DG CHEST PORT 1 VIEW  Result Date: 04/03/2023 CLINICAL DATA:  Status post AVR. EXAM: PORTABLE CHEST 1 VIEW COMPARISON:  Prior chest radiographs 04/02/2023 and earlier. FINDINGS: Right IJ approach central venous catheter with tip at the level of the superior cavoatrial junction. Redemonstrated  chest tubes. Prior median sternotomy and aortic valve replacement. The cardiomediastinal silhouette is unchanged. Unchanged lucency along the left aspect of the aortic knob. Increased prominence of the interstitial lung markings as compared to the prior examination of 04/02/2023. Persistent ill-defined opacity at the left lung base. No definite pleural effusion. No evidence of pneumothorax. Spondylosis and dextrocurvature of the thoracic spine. IMPRESSION: 1. Support apparatus as described. 2. Increased prominence of the interstitial lung markings as compared to the prior examination of 04/02/2023, likely reflecting pulmonary interstitial  edema. 3. Persistent ill-defined opacity within the left lung base, favored to reflect atelectasis. 4. Unchanged lucency along the left aspect of the aortic knob, suspicious for small-volume pneumomediastinum. Electronically Signed   By: Jackey Loge D.O.   On: 04/03/2023 08:19   DG CHEST PORT 1 VIEW  Result Date: 04/02/2023 CLINICAL DATA:  [id number] S/P AVR [id number] EXAM: PORTABLE CHEST 1 VIEW COMPARISON:  April 01, 2023 FINDINGS: Incomplete visualization of the apices. The cardiomediastinal silhouette is unchanged and enlarged in contour.Status post aortic valve replacement. Interval extubation. LEFT-sided chest tube and midline drains. RIGHT IJ CVC tip terminates over the inferior SVC. LEFT IJ sheath tip terminates over the LEFT brachiocephalic vein. No large pleural effusion. No significant pneumothorax. Mildly increased interstitial prominence and vascular indistinctness. Triangular lucency to the LEFT of the aorta. IMPRESSION: 1. Mildly increased interstitial prominence and vascular indistinctness, likely reflecting pulmonary edema. 2. Triangular lucency to the LEFT of the aorta, possibly small volume subcutaneous or mediastinal air versus summation artifact. Recommend attention on follow-up. Electronically Signed   By: Meda Klinefelter M.D.   On: 04/02/2023 09:06     Assessment/Plan: E.faecalis MV and AV endocarditis = plan to continue with dual beta lactam therapy with ampicillin plus ceftriaxone. Restarting antibiotic clock for 6 wk using 9/22 as day 1.  Will discuss that he may extend a longer course of oral abtx thereafter  Hx of left hip arthroplasty = once more stable, recommend that he has mri of left hip to evaluate for septic pji.  Cns septic emboli = continue with pt as tolerated once chest tube removed and can mobilize easier  Venous access = can get picc line (dual lumen) when primary team clears him.  [facility] Pager:  [phone]  04/03/2023, 4:41 PM

## 2023-04-03 NOTE — Plan of Care (Signed)
  Problem: Clinical Measurements: Goal: Ability to maintain clinical measurements within normal limits will improve Outcome: Progressing Goal: Will remain free from infection Outcome: Progressing Goal: Diagnostic test results will improve Outcome: Progressing Goal: Respiratory complications will improve Outcome: Progressing Goal: Cardiovascular complication will be avoided Outcome: Progressing   Problem: Activity: Goal: Risk for activity intolerance will decrease Outcome: Progressing   Problem: Nutrition: Goal: Adequate nutrition will be maintained Outcome: Progressing   Problem: Coping: Goal: Level of anxiety will decrease Outcome: Progressing   Problem: Elimination: Goal: Will not experience complications related to bowel motility Outcome: Progressing Goal: Will not experience complications related to urinary retention Outcome: Progressing   Problem: Pain Managment: Goal: General experience of comfort will improve Outcome: Progressing   Problem: Safety: Goal: Ability to remain free from injury will improve Outcome: Progressing   Problem: Skin Integrity: Goal: Risk for impaired skin integrity will decrease Outcome: Progressing   Problem: Activity: Goal: Ability to tolerate increased activity will improve Outcome: Progressing   Problem: Respiratory: Goal: Ability to maintain a clear airway and adequate ventilation will improve Outcome: Progressing   Problem: Fluid Volume: Goal: Ability to maintain a balanced intake and output will improve Outcome: Progressing   Problem: Health Behavior/Discharge Planning: Goal: Ability to identify and utilize available resources and services will improve Outcome: Progressing Goal: Ability to manage health-related needs will improve Outcome: Progressing   Problem: Metabolic: Goal: Ability to maintain appropriate glucose levels will improve Outcome: Progressing   Problem: Nutritional: Goal: Maintenance of adequate  nutrition will improve Outcome: Progressing Goal: Progress toward achieving an optimal weight will improve Outcome: Progressing   Problem: Skin Integrity: Goal: Risk for impaired skin integrity will decrease Outcome: Progressing   Problem: Tissue Perfusion: Goal: Adequacy of tissue perfusion will improve Outcome: Progressing   Problem: Education: Goal: Will demonstrate proper wound care and an understanding of methods to prevent future damage Outcome: Progressing Goal: Knowledge of disease or condition will improve Outcome: Progressing Goal: Knowledge of the prescribed therapeutic regimen will improve Outcome: Progressing Goal: Individualized Educational Video(s) Outcome: Progressing   Problem: Activity: Goal: Risk for activity intolerance will decrease Outcome: Progressing   Problem: Cardiac: Goal: Will achieve and/or maintain hemodynamic stability Outcome: Progressing   Problem: Clinical Measurements: Goal: Postoperative complications will be avoided or minimized Outcome: Progressing   Problem: Respiratory: Goal: Respiratory status will improve Outcome: Progressing   Problem: Skin Integrity: Goal: Wound healing without signs and symptoms of infection Outcome: Progressing Goal: Risk for impaired skin integrity will decrease Outcome: Progressing   Problem: Urinary Elimination: Goal: Ability to achieve and maintain adequate renal perfusion and functioning will improve Outcome: Progressing

## 2023-04-03 NOTE — Progress Notes (Signed)
      [address]       [name] [address]             [phone]      S/p MVR, aortic root replacement  Sleeping currently  BP 112/85   Pulse 81   Temp 97.9 F (36.6 C) (Oral)   Resp 13   Wt 114 kg   SpO2 97%   BMI 35.05 kg/m  In SR in 80s on amiodarone drip 2L [place] 96% sat   Intake/Output Summary (Last 24 hours) at 04/03/2023 1806 Last data filed at 04/03/2023 1600 Gross per 24 hour  Intake 1996.1 ml  Output 1945 ml  Net 51.1 ml   CBG mildly elevated  Viviann Spare C. Dorris Fetch, MD [facility] [phone]

## 2023-04-03 NOTE — Evaluation (Signed)
Occupational Therapy Evaluation Patient Details Name: Dale Mills MRN: [id number] DOB: July 07, 1966 Today's Date: 04/03/2023   History of Present Illness Pt is a 57 y/o M presenting to [facility] with fever, nausea, chills, hypotensive, CXR with flash pulmonary edema, intubated. MRI head obtained due to persistent encephalopathy and found to have R PCA territory occipital lobe CVA with small acute infarcts in bilateral parietal lobes/cerebellum, small acute SA hemorrhage.Transferred to [facility] for severe mitral valve endocarditis s/p MVR on 9/20.  PMH includes HTN, CAD, recurrent UTI, pyelonephritis, L THA   Clinical Impression   PTA, pt was living alone, ind with ADLs/mobility. Did drive but was not working, plans to d/c to his daughter's home which is handicapped accessible per pt. Pt currently needing set up -max A for ADLs, mod-max A +2 for bed mobility and mod-max A +2 for transfers with 2 person HHA. Pt with decr cognition/awareness of deficits, appears to have decr coordination in LUE  > RUE and decr visual tracking/attention to L side during session, keeps head turned R but can turn to L with cues. Pt presenting with impairments listed below, will follow acutely. Patient will benefit from intensive inpatient follow up therapy, >3 hours/day to maximize safety/ind with ADLs/functional mobility.       If plan is discharge home, recommend the following: Two people to help with walking and/or transfers;A lot of help with bathing/dressing/bathroom;Assistance with cooking/housework;Direct supervision/assist for medications management;Direct supervision/assist for financial management;Assist for transportation;Help with stairs or ramp for entrance;Supervision due to cognitive status    Functional Status Assessment  Patient has had a recent decline in their functional status and demonstrates the ability to make significant improvements in function in a reasonable and predictable amount of time.   Equipment Recommendations  Other (comment) (defer)    Recommendations for Other Services PT consult;Rehab consult     Precautions / Restrictions Precautions Precautions: Sternal;Fall Restrictions Weight Bearing Restrictions: Yes (sternal precautions) RUE Weight Bearing: Non weight bearing LUE Weight Bearing: Non weight bearing Other Position/Activity Restrictions: chest tube, ext pacer      Mobility Bed Mobility Overal bed mobility: Needs Assistance Bed Mobility: Sidelying to Sit   Sidelying to sit: Mod assist, Max assist, +2 for physical assistance, HOB elevated            Transfers Overall transfer level: Needs assistance Equipment used: 2 person hand held assist Transfers: Sit to/from Stand, Bed to chair/wheelchair/BSC Sit to Stand: Mod assist, +2 physical assistance     Step pivot transfers: Max assist, +2 physical assistance     General transfer comment: use of bed pad, pt flexing trunk, needs cues for step sequencing      Balance Overall balance assessment: Needs assistance Sitting-balance support: Feet supported Sitting balance-Leahy Scale: Fair Sitting balance - Comments: sits statically, tends to lean posteriorly but needs incr cues                                   ADL either performed or assessed with clinical judgement   ADL Overall ADL's : Needs assistance/impaired Eating/Feeding: Set up;Sitting Eating/Feeding Details (indicate cue type and reason): drinking water Grooming: Set up;Sitting   Upper Body Bathing: Moderate assistance   Lower Body Bathing: Maximal assistance   Upper Body Dressing : Moderate assistance   Lower Body Dressing: Maximal assistance   Toilet Transfer: Moderate assistance;Maximal assistance;+2 for physical assistance   Toileting- Clothing Manipulation and Hygiene: Total assistance  Functional mobility during ADLs: Moderate assistance;Maximal assistance;+2 for physical assistance        Vision Baseline Vision/History: 1 Wears glasses Vision Assessment?: Yes Ocular Range of Motion: Restricted on the left;Impaired-to be further tested in functional context Alignment/Gaze Preference: Head tilt;Gaze right Tracking/Visual Pursuits: Decreased smoothness of horizontal tracking;Decreased smoothness of vertical tracking;Requires cues, head turns, or add eye shifts to track Saccades: Undershoots Visual Fields: Impaired-to be further tested in functional context;Left visual field deficit Depth Perception: Undershoots Additional Comments: undershoots when coordinating finger to nose task with L >R UE, identifies 3/5 numbers correctly when held in different areas of visual fields, requries cues to look to L and turns head frequently during assessment     Perception Perception: Not tested       Praxis Praxis: Not tested       Pertinent Vitals/Pain Pain Assessment Pain Assessment: No/denies pain     Extremity/Trunk Assessment Upper Extremity Assessment Upper Extremity Assessment: Right hand dominant;LUE deficits/detail;RUE deficits/detail RUE Deficits / Details: global weakness, but WFL for basic ROM within limits of sternal prec LUE Deficits / Details: global weakness compared to RUE, decr coordination during functional reach task   Lower Extremity Assessment Lower Extremity Assessment: Defer to PT evaluation   Cervical / Trunk Assessment Cervical / Trunk Assessment: Other exceptions (head tilt to R side)   Communication Communication Communication: No apparent difficulties   Cognition Arousal: Lethargic Behavior During Therapy: Flat affect Overall Cognitive Status: Impaired/Different from baseline Area of Impairment: Attention, Memory, Following commands, Safety/judgement, Awareness, Problem solving, Orientation                 Orientation Level: Time Current Attention Level: Focused Memory: Decreased short-term memory, Decreased recall of  precautions Following Commands: Follows one step commands with increased time Safety/Judgement: Decreased awareness of deficits Awareness: Intellectual Problem Solving: Slow processing, Requires verbal cues, Difficulty sequencing, Decreased initiation General Comments: able to recall why he is in the hospital, cannot recall date after being asked by therapist, overall decr awareness of deficits     General Comments  VSS on supplemental O2    Exercises     Shoulder Instructions      Home Living Family/patient expects to be discharged to:: Private residence Living Arrangements: Alone Available Help at Discharge: Family Type of Home: House Home Access: Ramped entrance     Home Layout: One level                   Additional Comments: plans to go to daughter's home, reports is handicapped accessable      Prior Functioning/Environment Prior Level of Function : Independent/Modified Independent;Driving             Mobility Comments: no AD ADLs Comments: ind, was not working        OT Problem List: Decreased strength;Decreased range of motion;Decreased activity tolerance;Impaired vision/perception;Impaired balance (sitting and/or standing);Decreased coordination;Decreased cognition;Decreased safety awareness;Impaired UE functional use;Cardiopulmonary status limiting activity      OT Treatment/Interventions: Self-care/ADL training;Therapeutic exercise;Energy conservation;DME and/or AE instruction;Therapeutic activities;Patient/family education;Balance training;Visual/perceptual remediation/compensation;Cognitive remediation/compensation    OT Goals(Current goals can be found in the care plan section) Acute Rehab OT Goals Patient Stated Goal: none stated OT Goal Formulation: With patient Time For Goal Achievement: 04/17/23 Potential to Achieve Goals: Good  OT Frequency: Min 1X/week    Co-evaluation PT/OT/SLP Co-Evaluation/Treatment: Yes Reason for Co-Treatment:  Complexity of the patient's impairments (multi-system involvement);Necessary to address cognition/behavior during functional activity;To address functional/ADL transfers   OT goals addressed during  session: ADL's and self-care      AM-PAC OT "6 Clicks" Daily Activity     Outcome Measure Help from another person eating meals?: A Little Help from another person taking care of personal grooming?: A Little Help from another person toileting, which includes using toliet, bedpan, or urinal?: Total Help from another person bathing (including washing, rinsing, drying)?: A Lot Help from another person to put on and taking off regular upper body clothing?: A Lot Help from another person to put on and taking off regular lower body clothing?: A Lot 6 Click Score: 13   End of Session Equipment Utilized During Treatment: Oxygen Nurse Communication: Mobility status  Activity Tolerance: Patient tolerated treatment well Patient left: in chair;with call bell/phone within reach;with chair alarm set;with family/visitor present  OT Visit Diagnosis: Unsteadiness on feet (R26.81);Other abnormalities of gait and mobility (R26.89);Muscle weakness (generalized) (M62.81);Other symptoms and signs involving the nervous system (R29.898);Other symptoms and signs involving cognitive function                Time: 8119-1478 OT Time Calculation (min): 29 min Charges:  OT General Charges $OT Visit: 1 Visit OT Evaluation $OT Eval Moderate Complexity: 1 Mod  Joyce Leckey K, OTD, OTR/L SecureChat Preferred Acute Rehab [phone]   Kairav Russomanno K Koonce 04/03/2023, 10:30 AM

## 2023-04-03 NOTE — Evaluation (Signed)
Physical Therapy Evaluation Patient Details Name: Dale Mills MRN: [id number] DOB: 12-05-1965 Today's Date: 04/03/2023  History of Present Illness  Pt is a 57 y/o M presenting to [facility] with fever, nausea, chills, hypotensive, CXR with flash pulmonary edema, intubated. MRI head obtained due to persistent encephalopathy and found to have R PCA territory occipital lobe CVA with small acute infarcts in bilateral parietal lobes/cerebellum, small acute SA hemorrhage.Transferred to [facility] for severe mitral valve endocarditis s/p MVR on 9/20.  PMH includes HTN, CAD, recurrent UTI, pyelonephritis, L THA   Clinical Impression  Pt admitted with above. Pt with noted R gaze preference, difficulty tracking to the L, noted L sided inattention, generalized weakness and decondition from MVR and R PCA infarct. Pt currently requiring modA for bed mobility and maxA for OOB transfer to chair. Pt with plans to return home with daughter once achieved supervision level of function. Pt to strongly benefit from aggressive inpatient rehab program > 3hrs/day to achieve this level of function for safe transition home with daughter. Acute PT to cont to follow.        If plan is discharge home, recommend the following: A lot of help with walking and/or transfers;A lot of help with bathing/dressing/bathroom;Supervision due to cognitive status;Help with stairs or ramp for entrance;Assist for transportation;Direct supervision/assist for financial management;Direct supervision/assist for medications management;Assistance with cooking/housework   Can travel by private vehicle        Equipment Recommendations Other (comment) (TBD next venue)  Recommendations for Other Services  Rehab consult    Functional Status Assessment Patient has had a recent decline in their functional status and demonstrates the ability to make significant improvements in function in a reasonable and predictable amount of time.      Precautions / Restrictions Precautions Precautions: Sternal;Fall Precaution Booklet Issued: No Precaution Comments: educated on precautions Restrictions Weight Bearing Restrictions: Yes (sternal precautions) RUE Weight Bearing: Non weight bearing LUE Weight Bearing: Non weight bearing Other Position/Activity Restrictions: chest tube, ext pacer      Mobility  Bed Mobility Overal bed mobility: Needs Assistance Bed Mobility: Supine to Sit     Supine to sit: Mod assist, Max assist, HOB elevated, +2 for safety/equipment     General bed mobility comments: directional verbal cues to sequence task, HOB elevated, min    Transfers Overall transfer level: Needs assistance Equipment used: 2 person hand held assist Transfers: Sit to/from Stand, Bed to chair/wheelchair/BSC Sit to Stand: Mod assist, +2 physical assistance   Step pivot transfers: Max assist, +2 physical assistance       General transfer comment: use of bed pad, pt flexing trunk, needs cues for step sequencing    Ambulation/Gait               General Gait Details: limited to steps to chair, 2 attempts, max tactile cues for weight shifting using bed pads under patient's bottom  Stairs            Wheelchair Mobility     Tilt Bed    Modified Rankin (Stroke Patients Only)       Balance Overall balance assessment: Needs assistance Sitting-balance support: Feet supported Sitting balance-Leahy Scale: Fair Sitting balance - Comments: sits statically, tends to lean posteriorly but needs incr cues to bring chest forward, difficult to maintain   Standing balance support: During functional activity, Reliant on assistive device for balance Standing balance-Leahy Scale: Zero Standing balance comment: dependent on external support  Pertinent Vitals/Pain Pain Assessment Pain Assessment: No/denies pain    Home Living Family/patient expects to be discharged to::  Private residence Living Arrangements: Alone Available Help at Discharge: Family Type of Home: House Home Access: Ramped entrance       Home Layout: One level   Additional Comments: plans to go to daughter's home, reports is handicapped accessable    Prior Function Prior Level of Function : Independent/Modified Independent;Driving             Mobility Comments: no AD ADLs Comments: ind, was not working     Extremity/Trunk Assessment   Upper Extremity Assessment Upper Extremity Assessment: Defer to OT evaluation    Lower Extremity Assessment Lower Extremity Assessment: Generalized weakness (difficulty sequencing steps)    Cervical / Trunk Assessment Cervical / Trunk Assessment: Other exceptions (head tilt to R side)  Communication   Communication Communication: No apparent difficulties  Cognition Arousal: Lethargic Behavior During Therapy: Flat affect Overall Cognitive Status: Impaired/Different from baseline Area of Impairment: Attention, Memory, Following commands, Safety/judgement, Awareness, Problem solving, Orientation                 Orientation Level: Time Current Attention Level: Focused Memory: Decreased short-term memory, Decreased recall of precautions Following Commands: Follows one step commands with increased time Safety/Judgement: Decreased awareness of deficits Awareness: Intellectual Problem Solving: Slow processing, Requires verbal cues, Difficulty sequencing, Decreased initiation General Comments: able to recall why he is in the hospital, cannot recall date after being asked by therapist, overall decr awareness of deficitsk, delayed processing        General Comments General comments (skin integrity, edema, etc.): VSS    Exercises     Assessment/Plan    PT Assessment Patient needs continued PT services  PT Problem List Decreased strength;Decreased range of motion;Decreased activity tolerance;Decreased balance;Decreased  mobility;Decreased coordination;Decreased cognition;Decreased knowledge of use of DME;Decreased safety awareness;Decreased knowledge of precautions       PT Treatment Interventions DME instruction;Gait training;Functional mobility training;Therapeutic activities;Therapeutic exercise;Balance training;Neuromuscular re-education    PT Goals (Current goals can be found in the Care Plan section)  Acute Rehab PT Goals Patient Stated Goal: get better PT Goal Formulation: With patient Time For Goal Achievement: 04/17/23 Potential to Achieve Goals: Good    Frequency Min 1X/week     Co-evaluation PT/OT/SLP Co-Evaluation/Treatment: Yes Reason for Co-Treatment: Complexity of the patient's impairments (multi-system involvement);Necessary to address cognition/behavior during functional activity;To address functional/ADL transfers PT goals addressed during session: Mobility/safety with mobility         AM-PAC PT "6 Clicks" Mobility  Outcome Measure Help needed turning from your back to your side while in a flat bed without using bedrails?: A Lot Help needed moving from lying on your back to sitting on the side of a flat bed without using bedrails?: A Lot Help needed moving to and from a bed to a chair (including a wheelchair)?: A Lot Help needed standing up from a chair using your arms (e.g., wheelchair or bedside chair)?: A Lot Help needed to walk in hospital room?: Total Help needed climbing 3-5 steps with a railing? : Total 6 Click Score: 10    End of Session Equipment Utilized During Treatment: Oxygen Activity Tolerance: Patient tolerated treatment well Patient left: in chair;with call bell/phone within reach;with chair alarm set;with nursing/sitter in room Nurse Communication: Mobility status (RN present to manage lines for transfer) PT Visit Diagnosis: Difficulty in walking, not elsewhere classified (R26.2)    Time: 1610-9604 PT Time Calculation (min) (ACUTE  ONLY): 31  min   Charges:   PT Evaluation $PT Eval Moderate Complexity: 1 Mod   PT General Charges $$ ACUTE PT VISIT: 1 Visit         Lewis Shock, PT, DPT Acute Rehabilitation Services Secure chat preferred Office #: [phone]   Iona Hansen 04/03/2023, 2:14 PM

## 2023-04-03 NOTE — Progress Notes (Signed)
NAME:  Dale Mills, MRN:  [id number], DOB:  20-Dec-1965, LOS: 6 ADMISSION DATE:  03/28/2023, CONSULTATION DATE:  9/17 REFERRING MD:  August Saucer, CHIEF COMPLAINT:  endocarditis    History of Present Illness:  57 year old male patient with history as mentioned below which includes hypertension, coronary artery disease, recurrent urinary tract infections and pyelonephritis, and recent left hip arthroplasty with surgery completed back in April 2024.  Presents to the emergency room with chief complaint of nausea, lightheadedness, generalized weakness, fever and chills.  Presented from work on 9/16 with the above symptoms, initially found to be slightly hypotensive with systolic blood pressure 95 heart rate 92 white blood cell count of 20.4 hemoglobin 10.9 BUN was 39 and serum creatinine 2.6, baseline 0.6.  His BNP was 2880, UA was concerning for possible UTI chest x-ray was clear CT and abdomen was negative for acute findings a right upper quadrant ultrasound was obtained which showed cholelithiasis but no evidence of cholecystitis his heart rate continue to elevate while in the emergency room noted to be in sinus tachycardia heart rate 110s to 120s blood pressure remained in the soft 90s systolic cultures were sent he was administered IV crystalloid and 1 during resuscitation effort required increasing supplemental oxygen with progressive increased work of breathing and follow-up chest x-ray suggesting flash pulmonary edema.  He was unable to tolerate noninvasive ventilation and therefore was intubated.  His Tmax while in the emergency room was 105.8 his blood pressure at its lowest dropped to the 80s systolic.  He was started on dobutamine infusion and broad-spectrum antibiotics have been started as well working diagnosis at time of initial evaluation was sepsis/septic shock felt secondary to urinary tract source complicated by AKI, non-STEMI, and flash pulmonary edema.  He was admitted to the intensive care,  antibiotics were continued as were resuscitation efforts and biotics.  Blood cultures were positive for GPC in pairs and chains with PCR detecting e faecalis.  Transthoracic echocardiogram suggesting endocarditis because of these findings he was transferred to [facility] for definitive care  Pertinent  Medical History  Recent Left hip replacement, pyelonephritis, hypertension, depression, anxiety, coronary artery disease, prior cardiac catheterization, prior hernia repair.  Last echocardiogram in July 2024 showed EF 55 to 60%, recurrent UTIs.  Discussed this  Significant Hospital Events: Including procedures, antibiotic start and stop dates in addition to other pertinent events    9/16 admitted with septic shock initially felt urinary tract source, abx started 9/16 through 9/17 cultures positive for GPC in pairs and clusters, PCR suggesting E facialis, echocardiogram suggesting mitral valve endocarditis, transferred to [facility] TEE obtained at bedside and ccm admitting  9/20 PROCEDURES:   MITRAL VALVE (MV) REPLACEMENT  ASCENDING AORTIC ROOT REPLACEMENT UTILIZING 27 KONECT RESILIA AORTIC VALVE CONDUIT (N/A) TRANSESOPHAGEAL ECHOCARDIOGRAM (N/A)  Interim History / Subjective:  No events Diuresing well Too weak to eat breakfast however  Objective   Blood pressure 115/85, pulse (!) 104, temperature 99 F (37.2 C), temperature source Axillary, resp. rate 18, weight 114 kg, SpO2 96%.        Intake/Output Summary (Last 24 hours) at 04/03/2023 0728 Last data filed at 04/03/2023 0600 Gross per 24 hour  Intake 1551.58 ml  Output 3935 ml  Net -2383.42 ml   Filed Weights   04/01/23 0245 04/02/23 0500 04/03/23 0500  Weight: 113.6 kg 114.8 kg 114 kg    Examination: No distress Heart sounds irregular, occasionally going into Afib/RVR Sternotomy site looks okay Mediastinal drain minimal output Not much edema  Weak on both sides for me, slightly slurred speech; defer full exam to neuro Seems  oriented  BMP benign Stable leukocytosis, anemia Thrombocytopenia improved Sugars okay   Resolved Hospital Problem list     Assessment & Plan:   Septic shock secondary to  E Facialis bacteremia and AV/MV endocarditis, baseline aortic root dilation- s/p MVR + aortic root replacement 03/31/23 Acute hypoxic resp failure 2/2 pulmonary edema and possible element of ALI Acute renal failure secondary to septic shock Acute metabolic encephalopathy 2/2 sepsis and multifocal CVA 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; bilateral small ceribellar/parietal, R PCA larger infarct Chronic anemia  Postop afib- to start amio 04/03/23 H/o CAD w/ elevated trop I presumed type 2 NSTEMI HLD HTN Hyperglycemia- SSI Hx AAA  - Drain management per TCTS - Prolonged abx as outlined by ID - Amio to start today, if recurrent may need to consider full AC - Continue diuresis - PT/OT/SLP to see; GDMT per neuro/TCTS; ?CIR - De-lining per TCTS - Will follow while in ICU; great improvement seen so far  Myrla Halsted MD PCCM [facility] 04/03/2023 7:28 AM

## 2023-04-04 DIAGNOSIS — I059 Rheumatic mitral valve disease, unspecified: Secondary | ICD-10-CM

## 2023-04-04 LAB — CBC
HCT: 25.6 % — ABNORMAL LOW (ref 39.0–52.0)
Hemoglobin: 7.9 g/dL — ABNORMAL LOW (ref 13.0–17.0)
MCH: 28.6 pg (ref 26.0–34.0)
MCHC: 30.9 g/dL (ref 30.0–36.0)
MCV: 92.8 fL (ref 80.0–100.0)
Platelets: 154 10*3/uL (ref 150–400)
RBC: 2.76 MIL/uL — ABNORMAL LOW (ref 4.22–5.81)
RDW: 19.5 % — ABNORMAL HIGH (ref 11.5–15.5)
WBC: 12.4 10*3/uL — ABNORMAL HIGH (ref 4.0–10.5)
nRBC: 0.2 % (ref 0.0–0.2)

## 2023-04-04 LAB — BASIC METABOLIC PANEL
Anion gap: 8 (ref 5–15)
BUN: 16 mg/dL (ref 6–20)
CO2: 29 mmol/L (ref 22–32)
Calcium: 8.7 mg/dL — ABNORMAL LOW (ref 8.9–10.3)
Chloride: 102 mmol/L (ref 98–111)
Creatinine, Ser: 0.69 mg/dL (ref 0.61–1.24)
GFR, Estimated: >60 mL/min (ref >60)
Glucose, Bld: 106 mg/dL — ABNORMAL HIGH (ref 70–99)
Potassium: 3.7 mmol/L (ref 3.5–5.1)
Sodium: 139 mmol/L (ref 135–145)

## 2023-04-04 LAB — GLUCOSE, CAPILLARY
Glucose-Capillary: 85 mg/dL (ref 70–99)
Glucose-Capillary: 117 mg/dL — ABNORMAL HIGH (ref 70–99)
Glucose-Capillary: 135 mg/dL — ABNORMAL HIGH (ref 70–99)
Glucose-Capillary: 86 mg/dL (ref 70–99)
Glucose-Capillary: 129 mg/dL — ABNORMAL HIGH (ref 70–99)
Glucose-Capillary: 114 mg/dL — ABNORMAL HIGH (ref 70–99)
Glucose-Capillary: 113 mg/dL — ABNORMAL HIGH (ref 70–99)
Glucose-Capillary: 140 mg/dL — ABNORMAL HIGH (ref 70–99)
Glucose-Capillary: 91 mg/dL (ref 70–99)

## 2023-04-04 LAB — MAGNESIUM: Magnesium: 2 mg/dL (ref 1.7–2.4)

## 2023-04-04 MED ORDER — TRAZODONE HCL 50 MG PO TABS
50.0000 mg | ORAL_TABLET | Freq: Every evening | ORAL | Status: DC | PRN
  Administered 2023-04-04 – 2023-04-08 (×4): 50 mg via ORAL
  Filled 2023-04-04 (×4): qty 1

## 2023-04-04 MED ORDER — POTASSIUM CHLORIDE 10 MEQ/50ML IV SOLN
10.0000 meq | INTRAVENOUS | Status: AC
  Administered 2023-04-04 (×4): 10 meq via INTRAVENOUS
  Filled 2023-04-04 (×4): qty 50

## 2023-04-04 MED ORDER — FUROSEMIDE 10 MG/ML IJ SOLN
40.0000 mg | Freq: Two times a day (BID) | INTRAMUSCULAR | Status: AC
  Administered 2023-04-04 (×2): 40 mg via INTRAVENOUS
  Filled 2023-04-04 (×2): qty 4

## 2023-04-04 MED ORDER — WARFARIN - PHARMACIST DOSING INPATIENT: Freq: Every day | Status: DC

## 2023-04-04 MED ORDER — WARFARIN SODIUM 5 MG PO TABS
5.0000 mg | ORAL_TABLET | Freq: Once | ORAL | Status: AC
  Administered 2023-04-04: 5 mg via ORAL
  Filled 2023-04-04: qty 1

## 2023-04-04 MED ORDER — POTASSIUM CHLORIDE CRYS ER 20 MEQ PO TBCR
20.0000 meq | EXTENDED_RELEASE_TABLET | ORAL | Status: DC
  Administered 2023-04-04: 20 meq via ORAL
  Filled 2023-04-04 (×2): qty 1

## 2023-04-04 NOTE — TOC Initial Note (Signed)
Transition of Care [facility]) - Initial/Assessment Note    Patient Details  Name: Dale Mills MRN: [id number] Date of Birth: 1966/04/14  Transition of Care [facility]) CM/SW Contact:    Gala Lewandowsky, RN Phone Number: 04/04/2023, 3:17 PM  Clinical Narrative: Patient presented for nausea, lightheadedness, generalized weakness, fever, and chills. PTA patient was from home alone and he was working. Patient is without insurance at this time. Case Manager was able to speak with daughter and she states that the patient has applied for Medicaid. Case Manager will continue to follow for transition of care needs. PT/OT recommendations are for Inpatient Rehab-will continue to follow.                     Expected Discharge Plan: IP Rehab Facility Barriers to Discharge: Continued Medical Work up  Expected Discharge Plan and Services   Discharge Planning Services: CM Consult Post Acute Care Choice: IP Rehab Living arrangements for the past 2 months: Single Family Home  Prior Living Arrangements/Services Living arrangements for the past 2 months: Single Family Home Lives with:: Self Patient language and need for interpreter reviewed:: Yes        Need for Family Participation in Patient Care: Yes (Comment) Care giver support system in place?: Yes (comment)   Criminal Activity/Legal Involvement Pertinent to Current Situation/Hospitalization: No - Comment as needed  Activities of Daily Living Home Assistive Devices/Equipment: None ADL Screening (condition at time of admission) Is the patient deaf or have difficulty hearing?: No Does the patient have difficulty seeing, even when wearing glasses/contacts?: No Does the patient have difficulty concentrating, remembering, or making decisions?: No  Permission Sought/Granted Permission sought to share information with : Case Manager, Family Supports   Emotional Assessment Appearance:: Appears stated age  Alcohol / Substance Use: Not  Applicable Psych Involvement: No (comment)  Admission diagnosis:  Endocarditis [I38] S/P aortic valve replacement and aortoplasty [Z95.2] Patient Active Problem List   Diagnosis Date Noted   S/P aortic valve replacement and aortoplasty 03/31/2023   Protein-calorie malnutrition, severe 03/30/2023   Acute respiratory failure (HCC) 03/29/2023   Septic shock (HCC) 03/29/2023   Enterococcal bacteremia 03/29/2023   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   Iron deficiency anemia 03/20/2023   Coronary artery disease coronary CT angio did not show any major issues, suspicion for distal disease 06/12/2020   Angina pectoris (HCC) Canadian classification 2 04/10/2020   Precordial chest pain 02/13/2020   Essential hypertension 02/13/2020   Dyslipidemia 02/13/2020   PCP:  Nonnie Done., MD Pharmacy:   [facility] [address] [place] - [address] [facility] [place] [address] Phone: [phone] Fax: [phone] Social Determinants of Health (SDOH) Social History: SDOH Screenings   Food Insecurity: Patient Unable To Answer (03/28/2023)  Recent Concern: Food Insecurity - Food Insecurity Present (03/17/2023)  Housing: Patient Unable To Answer (03/28/2023)  Transportation Needs: Patient Unable To Answer (03/28/2023)  Utilities: Patient Unable To Answer (03/28/2023)  Depression (PHQ2-9): Low Risk  (03/17/2023)  Tobacco Use: Low Risk  (03/28/2023)  Readmission Risk Interventions     No data to display

## 2023-04-04 NOTE — Progress Notes (Addendum)
Inpatient Rehab Coordinator Note:  I met with patient at bedside to discuss CIR recommendations and goals/expectations of CIR stay.  We reviewed 3 hrs/day of therapy, physician follow up, and average length of stay 2 weeks (dependent upon progress) with goals of supervision.  He states he is thinking of staying with his daughter Catering manager) at discharge and that she is a Runner, broadcasting/film/video.  We reviewed cost of rehab and he asks that I share process with his daughters so they can make decisions.  I will call to speak to them today or try to meet with them at the bedside this afternoon.   Addendum: Spoke to pt's daughters, Chrissy and Hospital doctor, on the phone to review above.  Both work, and are trying to consider how to provide 24/7 supervision at discharge.  We discussed likely plan for a month or so, tapering off supervision as pt demonstrates improved insight/awareness/problem solving/etc.  We discussed cost of care and they are already working on [place].  I will f/u with them either late tomorrow or Thursday for decision.    Estill Dooms, PT, DPT Admissions Coordinator [phone] 04/04/23  12:24 PM

## 2023-04-04 NOTE — Progress Notes (Signed)
SLP Cancellation Note  Patient Details Name: Dale Mills MRN: [id number] DOB: [birth year]   Cancelled treatment:        Attempted to see pt for dysphagia management and possible cognitive linguistic assessment.  Pt working with PT at time of attempt.  SLP will reattempt as schedule permits.   Kerrie Pleasure, MA, CCC-SLP Acute Rehabilitation Services Office: [phone] 04/04/2023, 11:15 AM

## 2023-04-04 NOTE — Progress Notes (Signed)
   04/04/23 1547  Spiritual Encounters  Type of Visit Initial  Care provided to: Pt and family  Referral source Patient request  Reason for visit Advance directives  OnCall Visit No  Interventions  Spiritual Care Interventions Made Established relationship of care and support;Compassionate presence;Reflective listening;Prayer   Chaplain responded to Pt request for Advance Care Directive education and paperwork.  Chaplain provided requested information. Chaplain remained and provided compassionate presence to Pt and daughter who were in the room.  Pt asked for prayer and chaplain provided it.  Chaplain explained to both pt and daughter how to contact Spiritual Care with questions or concerns.  Chaplain services remain available by Spiritual Consult or for emergent cases, paging [phone]  Chaplain Raelene Bott, MDiv [email or web address] [phone]

## 2023-04-04 NOTE — Progress Notes (Signed)
Nutrition Follow-up  DOCUMENTATION CODES:   Severe malnutrition in context of acute illness/injury  INTERVENTION:   Liberalize diet to Regular  Magic cup BID with meals, each supplement provides 290 kcal and 9 grams of protein  If po intake does not improve, recommend Cortrak placement   NUTRITION DIAGNOSIS:   Severe Malnutrition related to acute illness (possible chronic component as well) as evidenced by moderate fat depletion, moderate muscle depletion.  Continues but being addressed  GOAL:   Patient will meet greater than or equal to 90% of their needs  Not Met but being addressed  MONITOR:   Vent status, Labs, Weight trends, TF tolerance  REASON FOR ASSESSMENT:   Consult, Ventilator Enteral/tube feeding initiation and management  ASSESSMENT:   57 yo male admitted with septic shock with bacteremia, mitral valve endocarditis from enterococcus faecalis, acute pulmonary edema and respiratory failure requiring intubation, AKI. PMH includes CAD, anxiety, arthritis, dyslipidemia, HTN  9/17 Admitted 9/18 Trickle TF initiated with PS x 4 9/20 OR for MVR and aortic root replacement; advancing TF towards goal, added free water flushes 9/21 Extubated 9/22 Diet advanced to FL  Pt sitting up in chart today, alert.   Diet advanced today to Heart Healthy Carb Modified. Yesterday pt on FL diet but did not find any of the options appealing. Pt also tried Ensure but said he does not like  Recorded po intake on FL 0-25% of meals. Noted pt ate 20% of solid po diet for lunch  Noted weight trending up; current wt 115.6 kg; admit wt 110.4 kg  UOP 2.5 L in 24 hours, 1.3 L thus far today. Net negative 1.5 L since admission. Mild edema per RN assessment  Labs: reviewed Meds: lasix, ss novolog, levemir, dulcolax   Diet Order:   Diet Order             Diet heart healthy/carb modified Room service appropriate? Yes; Fluid consistency: Thin  Diet effective now                    EDUCATION NEEDS:   Education needs have been addressed (discussed nutrition plan with family)  Skin:  Skin Assessment: Reviewed RN Assessment  Last BM:  9/23  Height:   Ht Readings from Last 1 Encounters:  03/17/23 5\' 11"  (1.803 m)    Weight:   Wt Readings from Last 1 Encounters:  04/04/23 115.6 kg     BMI:  Body mass index is 35.54 kg/m.  Estimated Nutritional Needs:   Kcal:  2100-2300 kcals  Protein:  120-150 g  Fluid:  >/= 2L    Romelle Starcher MS, RDN, LDN, CNSC Registered Dietitian 3 Clinical Nutrition RD Pager and On-Call Pager Number Located in [place]

## 2023-04-04 NOTE — Progress Notes (Signed)
Physical Therapy Treatment Patient Details Name: Dale Mills MRN: [id number] DOB: 1966-04-06 Today's Date: 04/04/2023   History of Present Illness Pt is a 57 y/o M presenting to [facility] with fever, nausea, chills, hypotensive, CXR with flash pulmonary edema, intubated. 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  PMH includes HTN, CAD, recurrent UTI, pyelonephritis, L THA    PT Comments  Pt progressing well towards all goals this date. Pt able to amb with EVA walker with modAx2 and supportive staff to manage lines and chair follow. Pt with noted L inattention, near cross over gait pattern, generalized weakness, impaired balance, decreased activity tolerance and is at increased falls risk. Pt to continue to benefit from aggressive inpatient rehab program > 3 hrs a day to address above deficits to progress towards indep. Acute PT to cont to follow.    If plan is discharge home, recommend the following: A lot of help with walking and/or transfers;A lot of help with bathing/dressing/bathroom;Supervision due to cognitive status;Help with stairs or ramp for entrance;Assist for transportation;Direct supervision/assist for financial management;Direct supervision/assist for medications management;Assistance with cooking/housework   Can travel by private vehicle        Equipment Recommendations  Other (comment) (TBD next venue)    Recommendations for Other Services Rehab consult     Precautions / Restrictions Precautions Precautions: Sternal;Fall Precaution Booklet Issued: No Precaution Comments: pt able to recall precautions however requires constant cues to adhere to them functionally Restrictions Weight Bearing Restrictions: Yes (sternal precautions) RUE Weight  Bearing: Non weight bearing LUE Weight Bearing: Non weight bearing Other Position/Activity Restrictions: chest tube     Mobility  Bed Mobility Overal bed mobility: Needs Assistance Bed Mobility: Sit to Sidelying         Sit to sidelying: Mod assist, +2 for physical assistance General bed mobility comments: modA for LE management, modA to control trunk descent and watch lines    Transfers Overall transfer level: Needs assistance Equipment used: 2 person hand held assist Transfers: Sit to/from Stand, Bed to chair/wheelchair/BSC Sit to Stand: Mod assist, +2 physical assistance           General transfer comment: held pillow over chest, PT and tech assist with rocking motion forward and then to power up to EVA walker    Ambulation/Gait Ambulation/Gait assistance: Mod assist, +2 physical assistance (+2 for IV pole and chair follow) Gait Distance (Feet): 160 Feet Assistive device: Fara Boros Gait Pattern/deviations: Step-through pattern, Decreased stride length, Narrow base of support, Drifts right/left Gait velocity: dec Gait velocity interpretation: <1.31 ft/sec, indicative of household ambulator   General Gait Details: pt with near cross over gait pattern, modAx2 to manage EVA walker and provide tactile cues at hips for stability and equal weight shifting, 3 standing rest breaks   Stairs             Wheelchair Mobility     Tilt Bed    Modified Rankin (Stroke Patients Only)       Balance Overall balance assessment: Needs assistance Sitting-balance support: Feet supported Sitting balance-Leahy Scale: Fair Sitting balance - Comments: sits statically, tends to lean posteriorly but needs incr cues to bring chest forward, difficult to maintain   Standing balance support: During functional activity, Reliant on assistive device for balance Standing balance-Leahy Scale: Fair Standing  balance comment: dependent on external support                             Cognition Arousal: Alert Behavior During Therapy: WFL for tasks assessed/performed Overall Cognitive Status: Impaired/Different from baseline Area of Impairment: Attention, Memory, Following commands, Safety/judgement, Awareness, Problem solving, Orientation                 Orientation Level: Time Current Attention Level: Sustained Memory: Decreased short-term memory, Decreased recall of precautions Following Commands: Follows one step commands with increased time Safety/Judgement: Decreased awareness of deficits Awareness: Intellectual Problem Solving: Slow processing, Requires verbal cues, Difficulty sequencing, Decreased initiation General Comments: requires increased time to process        Exercises General Exercises - Lower Extremity Long Arc Quad: AROM, Both, 10 reps, Seated Hip Flexion/Marching: AROM, Both, 10 reps, Seated Toe Raises: AROM, Both, 10 reps, Seated Heel Raises: AROM, Both, 10 reps, Seated    General Comments General comments (skin integrity, edema, etc.): VSS      Pertinent Vitals/Pain Pain Assessment Pain Assessment: No/denies pain    Home Living                          Prior Function            PT Goals (current goals can now be found in the care plan section) Acute Rehab PT Goals Patient Stated Goal: get better PT Goal Formulation: With patient Time For Goal Achievement: 04/17/23 Potential to Achieve Goals: Good Progress towards PT goals: Progressing toward goals    Frequency    Min 1X/week      PT Plan      Co-evaluation              AM-PAC PT "6 Clicks" Mobility   Outcome Measure  Help needed turning from your back to your side while in a flat bed without using bedrails?: A Lot Help needed moving from lying on your back to sitting on the side of a flat bed without using bedrails?: A Lot Help needed moving to and from a bed to a chair (including a wheelchair)?: A Lot Help needed standing up from  a chair using your arms (e.g., wheelchair or bedside chair)?: A Lot Help needed to walk in hospital room?: A Lot Help needed climbing 3-5 steps with a railing? : Total 6 Click Score: 11    End of Session   Activity Tolerance: Patient tolerated treatment well Patient left: with call bell/phone within reach;with nursing/sitter in room;in bed;with bed alarm set Nurse Communication: Mobility status (RN present to manage lines) PT Visit Diagnosis: Difficulty in walking, not elsewhere classified (R26.2)     Time: 7829-5621 PT Time Calculation (min) (ACUTE ONLY): 26 min  Charges:    $Gait Training: 8-22 mins $Therapeutic Exercise: 8-22 mins PT General Charges $$ ACUTE PT VISIT: 1 Visit                     Lewis Shock, PT, DPT Acute Rehabilitation Services Secure chat preferred Office #: [phone]    Iona Hansen 04/04/2023, 1:16 PM

## 2023-04-04 NOTE — Progress Notes (Signed)
[facility]    Date of Admission:  03/28/2023   Total days of antibiotics 3   ID: Dale Mills is a 57 y.o. 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 OR tissue cx from 9/20 still + e.faecalis. complicated by septic CNS emboli. Has hip replacement in April 2024- complicated course healing Principal Problem:   Endocarditis of mitral valve Active Problems:   Acute respiratory failure (HCC)   Septic shock (HCC)   Enterococcal bacteremia   Protein-calorie malnutrition, severe   S/P aortic valve replacement and aortoplasty    Subjective: Afebrile, sitting up in chair this morning. Less pain to chest wall. Denies hip pain Medications:    stroke: early stages of recovery book   Does not apply Once   acetaminophen (TYLENOL) oral liquid 160 mg/5 mL  1,000 mg Oral Q6H   aspirin EC  81 mg Oral Daily   atorvastatin  80 mg Oral Daily   bisacodyl  10 mg Oral Daily   Or   bisacodyl  10 mg Rectal Daily   Chlorhexidine Gluconate Cloth  6 each Topical Daily   enoxaparin (LOVENOX) injection  40 mg Subcutaneous Q24H   ezetimibe  10 mg Oral Daily   feeding supplement  237 mL Oral TID BM   furosemide  40 mg Intravenous BID   insulin aspart  2-6 Units Subcutaneous Q4H   insulin detemir  12 Units Subcutaneous Daily   pantoprazole (PROTONIX) IV  40 mg Intravenous Daily   sodium chloride flush  3 mL Intravenous Q12H   tamsulosin  0.4 mg Oral BID   warfarin  5 mg Oral ONCE-1600   Warfarin - Pharmacist Dosing Inpatient   Does not apply q1600    Objective: Vital signs in last 24 hours: Temp:  [97.8 F (36.6 C)-98.2 F (36.8 C)] 97.8 F (36.6 C) (09/23 2300) Pulse Rate:  [77-103] 98 (09/24 1300) Resp:  [13-22] 22 (09/24 1300) BP: (88-128)/(62-85) 110/75 (09/24 1300) SpO2:  [93 %-100 %] 93 % (09/24  1300) Weight:  [115.6 kg] 115.6 kg (09/24 0500) Physical Exam  Constitutional: He is oriented to person, place, and time. He appears well-developed and well-nourished. No distress.  HENT: right ij Mouth/Throat: Oropharynx is clear and moist. No oropharyngeal exudate.  Cardiovascular: Normal rate, regular rhythm and normal heart sounds. Exam reveals no gallop and no friction rub.  No murmur heard.  Pulmonary/Chest: Effort normal and breath sounds normal. No respiratory distress. He has no wheezes. Chest tube Abdominal: Soft. Bowel sounds are normal. He exhibits no distension. There is no tenderness. Foley in place Lymphadenopathy:  He has no cervical adenopathy.  Neurological: He is alert and oriented to person, place, and time.  Skin: Skin is warm and dry. No rash noted. No erythema.  Psychiatric: He has a normal mood and affect. His behavior is normal.    Lab Results Recent Labs    04/03/23 0321 04/04/23 0401  WBC 18.9* 12.4*  HGB 8.2* 7.9*  HCT 26.6* 25.6*  NA 145 139  K 4.0 3.7  CL 109 102  CO2 29 29  BUN 20 16  CREATININE 0.84 0.69    Microbiology: 9/20: av cx - enterococcus 9/20: mv cx - enterococcus Studies/Results: [place] EKG SITE RITE  Result Date: 04/03/2023 If Site Rite image not attached, placement could not be confirmed  due to current cardiac rhythm.  DG CHEST PORT 1 VIEW  Result Date: 04/03/2023 CLINICAL DATA:  Status post AVR. EXAM: PORTABLE CHEST 1 VIEW COMPARISON:  Prior chest radiographs 04/02/2023 and earlier. FINDINGS: Right IJ approach central venous catheter with tip at the level of the superior cavoatrial junction. Redemonstrated chest tubes. Prior median sternotomy and aortic valve replacement. The cardiomediastinal silhouette is unchanged. Unchanged lucency along the left aspect of the aortic knob. Increased prominence of the interstitial lung markings as compared to the prior examination of 04/02/2023. Persistent ill-defined opacity at the left lung  base. No definite pleural effusion. No evidence of pneumothorax. Spondylosis and dextrocurvature of the thoracic spine. IMPRESSION: 1. Support apparatus as described. 2. Increased prominence of the interstitial lung markings as compared to the prior examination of 04/02/2023, likely reflecting pulmonary interstitial edema. 3. Persistent ill-defined opacity within the left lung base, favored to reflect atelectasis. 4. Unchanged lucency along the left aspect of the aortic knob, suspicious for small-volume pneumomediastinum. Electronically Signed   By: Jackey Loge D.O.   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 Chest tube to be removed today  Hx of THA in April 2024 = concern that it was seeded during prolonged process up to when he was hospitalized if can tolerate MRI at end of the week, repeat to do imaging for evaluation. Preferably aspirate for cell count and culture  Leukocytosis = improving since surgery and source control/treatment of enterococcal disseminated infection  Cns septic emboli =also treated with abtx, and will continue to evaluate for any deficits  Agree that he would be good candidate for CIR    [facility] Pager: [phone]  04/04/2023, 2:22 PM

## 2023-04-04 NOTE — Anesthesia Postprocedure Evaluation (Signed)
Anesthesia Post Note  Patient: Dale Mills  Procedure(s) Performed: MITRAL VALVE (MV) REPLACEMENT UTILIZING SIZE 33 MOSAIC PORCINE HEART VALVE (Chest) ASCENDING AORTIC ROOT REPLACEMENT UTILIZING 27 KONECT RESILIA  AORTIC VALVE CONDUIT (Chest) TRANSESOPHAGEAL ECHOCARDIOGRAM     Patient location during evaluation: SICU Anesthesia Type: General Level of consciousness: sedated Pain management: pain level controlled Vital Signs Assessment: post-procedure vital signs reviewed and stable Respiratory status: patient remains intubated per anesthesia plan Cardiovascular status: stable Postop Assessment: no apparent nausea or vomiting Anesthetic complications: no   No notable events documented.  Last Vitals:  Vitals:   04/04/23 0630 04/04/23 0700  BP:  (!) 88/71  Pulse: 88 100  Resp: 13 18  Temp:    SpO2: 96% 98%    Last Pain:  Vitals:   04/04/23 0545  TempSrc:   PainSc: 3    Pain Goal: Patients Stated Pain Goal: 0 (04/04/23 0500)                 Rheanne Cortopassi

## 2023-04-04 NOTE — Progress Notes (Signed)
NAME:  Dale Mills, MRN:  [id number], DOB:  05-12-1966, LOS: 7 ADMISSION DATE:  03/28/2023, CONSULTATION DATE:  9/17 REFERRING MD:  August Saucer, CHIEF COMPLAINT:  endocarditis    History of Present Illness:  57 year old male patient with history as mentioned below which includes hypertension, coronary artery disease, recurrent urinary tract infections and pyelonephritis, and recent left hip arthroplasty with surgery completed back in April 2024.  Presents to the emergency room with chief complaint of nausea, lightheadedness, generalized weakness, fever and chills.  Presented from work on 9/16 with the above symptoms, initially found to be slightly hypotensive with systolic blood pressure 95 heart rate 92 white blood cell count of 20.4 hemoglobin 10.9 BUN was 39 and serum creatinine 2.6, baseline 0.6.  His BNP was 2880, UA was concerning for possible UTI chest x-ray was clear CT and abdomen was negative for acute findings a right upper quadrant ultrasound was obtained which showed cholelithiasis but no evidence of cholecystitis his heart rate continue to elevate while in the emergency room noted to be in sinus tachycardia heart rate 110s to 120s blood pressure remained in the soft 90s systolic cultures were sent he was administered IV crystalloid and 1 during resuscitation effort required increasing supplemental oxygen with progressive increased work of breathing and follow-up chest x-ray suggesting flash pulmonary edema.  He was unable to tolerate noninvasive ventilation and therefore was intubated.  His Tmax while in the emergency room was 105.8 his blood pressure at its lowest dropped to the 80s systolic.  He was started on dobutamine infusion and broad-spectrum antibiotics have been started as well working diagnosis at time of initial evaluation was sepsis/septic shock felt secondary to urinary tract source complicated by AKI, non-STEMI, and flash pulmonary edema.  He was admitted to the intensive care,  antibiotics were continued as were resuscitation efforts and biotics.  Blood cultures were positive for GPC in pairs and chains with PCR detecting e faecalis.  Transthoracic echocardiogram suggesting endocarditis because of these findings he was transferred to [name] for definitive care  Pertinent  Medical History  Recent Left hip replacement, pyelonephritis, hypertension, depression, anxiety, coronary artery disease, prior cardiac catheterization, prior hernia repair.  Last echocardiogram in July 2024 showed EF 55 to 60%, recurrent UTIs.  Discussed this  Significant Hospital Events: Including procedures, antibiotic start and stop dates in addition to other pertinent events    9/16 admitted with septic shock initially felt urinary tract source, abx started 9/16 through 9/17 cultures positive for GPC in pairs and clusters, PCR suggesting E facialis, echocardiogram suggesting mitral valve endocarditis, transferred to [facility] TEE obtained at bedside and ccm admitting  9/20 PROCEDURES:   MITRAL VALVE (MV) REPLACEMENT  ASCENDING AORTIC ROOT REPLACEMENT UTILIZING 27 KONECT RESILIA AORTIC VALVE CONDUIT (N/A) TRANSESOPHAGEAL ECHOCARDIOGRAM (N/A)  Interim History / Subjective:  No events, improving strength. Wants to try advancing diet.  Objective   Blood pressure 128/78, pulse 98, temperature 97.8 F (36.6 C), temperature source Oral, resp. rate 19, weight 115.6 kg, SpO2 98%.        Intake/Output Summary (Last 24 hours) at 04/04/2023 1252 Last data filed at 04/04/2023 1215 Gross per 24 hour  Intake 2735.64 ml  Output 3100 ml  Net -364.36 ml   Filed Weights   04/02/23 0500 04/03/23 0500 04/04/23 0500  Weight: 114.8 kg 114 kg 115.6 kg    Examination: No distress Heart sounds regular Moves to command Dysarthria improving Ext warm Sternotomy site healing  Patient Lines/Drains/Airways Status  Active Line/Drains/Airways     Name Placement date Placement time Site Days    Peripheral IV 03/28/23 20 G Anterior;Left Forearm 03/28/23  1400  Forearm  7   CVC Triple Lumen 03/28/23 Right Internal jugular 03/28/23  1400  -- 7   Urethral Catheter 03/28/23  1400  --  7   Y Chest Tube 1, 2, and 3 1 Medial Mediastinal 36 Fr. 2 Medial Mediastinal 32 Fr. 3 Medial Mediastinal 32 Fr. 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

## 2023-04-04 NOTE — Progress Notes (Signed)
ANTICOAGULATION CONSULT NOTE - Initial Consult  Pharmacy Consult for warfarin Indication: atrial fibrillation  No Known Allergies  Patient Measurements: Weight: 115.6 kg (254 lb 13.6 oz) Heparin Dosing Weight: 100 kg 93.75 Vital Signs: BP: 128/78 (09/24 1200) Pulse Rate: 98 (09/24 1200)  Labs: Recent Labs    04/02/23 0241 04/03/23 0321 04/04/23 0401  HGB 8.1* 8.2* 7.9*  HCT 24.8* 26.6* 25.6*  PLT 121* 147* 154  CREATININE 1.03 0.84 0.69    Estimated Creatinine Clearance: 131.7 mL/min (by C-G formula based on SCr of 0.69 mg/dL).   Medical History: Past Medical History:  Diagnosis Date   Angina pectoris (HCC) [place] classification 2 04/10/2020   Anxiety    Arthritis    Coronary artery disease coronary CT angio did not show any major issues, suspicion for distal disease 06/12/2020   Depression    DJD (degenerative joint disease)    Dyslipidemia 02/13/2020   Essential hypertension 02/13/2020   Precordial chest pain 02/13/2020    Medications:  Scheduled:    stroke: early stages of recovery book   Does not apply Once   acetaminophen (TYLENOL) oral liquid 160 mg/5 mL  1,000 mg Oral Q6H   aspirin EC  81 mg Oral Daily   atorvastatin  80 mg Oral Daily   bisacodyl  10 mg Oral Daily   Or   bisacodyl  10 mg Rectal Daily   Chlorhexidine Gluconate Cloth  6 each Topical Daily   enoxaparin (LOVENOX) injection  40 mg Subcutaneous Q24H   ezetimibe  10 mg Oral Daily   feeding supplement  237 mL Oral TID BM   furosemide  40 mg Intravenous BID   insulin aspart  2-6 Units Subcutaneous Q4H   insulin detemir  12 Units Subcutaneous Daily   pantoprazole (PROTONIX) IV  40 mg Intravenous Daily   sodium chloride flush  3 mL Intravenous Q12H   tamsulosin  0.4 mg Oral BID   Infusions:   sodium chloride     amiodarone 30 mg/hr (04/04/23 1300)   ampicillin (OMNIPEN) IV Stopped (04/04/23 1233)   cefTRIAXone (ROCEPHIN)  IV Stopped (04/04/23 1117)   potassium chloride 50 mL/hr at  04/04/23 1300    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 Having recurrent Afib postop, pharmacy now consulted to start warfarin for Afib.   Given that patient is POD 4 and on amiodarone and ampicillin that may increase serum warfarin, will start with a conservative dose and monitor closely for bleeding. Patient eating ~ 25% of meals.    Goal of Therapy:  INR 2-3 Monitor platelets by anticoagulation protocol: Yes   Plan:  Give warfarin 5mg  x1 Monitor daily INR Monitor for signs/symptoms of bleeding  Romie Minus, PharmD PGY1 Pharmacy Resident  Please check AMION for all [facility] Pharmacy phone numbers After 10:00 PM, call Main Pharmacy [phone] 04/04/2023,1:12 PM

## 2023-04-04 NOTE — Progress Notes (Addendum)
TCTS DAILY ICU PROGRESS NOTE                   [address]            [place] [address]          [phone]   4 Days Post-Op Procedure(s) (LRB): MITRAL VALVE (MV) REPLACEMENT UTILIZING SIZE 33 MOSAIC PORCINE HEART VALVE (N/A) ASCENDING AORTIC ROOT REPLACEMENT UTILIZING 27 KONECT RESILIA  AORTIC VALVE CONDUIT (N/A) TRANSESOPHAGEAL ECHOCARDIOGRAM (N/A)  Total Length of Stay:  LOS: 7 days   Subjective: Up in the bedside chair, alert and appropriate mental status. No new concerns.   Amiodarone infusing.   Objective: Vital signs in last 24 hours: Temp:  [97.8 F (36.6 C)-98.2 F (36.8 C)] 97.8 F (36.6 C) (09/23 2300) Pulse Rate:  [77-101] 100 (09/24 0700) Cardiac Rhythm: Normal sinus rhythm (09/24 0500) Resp:  [13-25] 18 (09/24 0700) BP: (88-122)/(62-90) 88/71 (09/24 0700) SpO2:  [95 %-100 %] 98 % (09/24 0700) Weight:  [115.6 kg] 115.6 kg (09/24 0500)  Filed Weights   04/02/23 0500 04/03/23 0500 04/04/23 0500  Weight: 114.8 kg 114 kg 115.6 kg    Weight change: 1.6 kg   Hemodynamic parameters for last 24 hours:    Intake/Output from previous day: 09/23 0701 - 09/24 0700 In: 2649.7 [P.O.:1200; I.V.:549.7; IV Piggyback:900] Out: 2800 [Urine:2445; Chest Tube:355]  Intake/Output this shift: No intake/output data recorded.  Current Meds: Scheduled Meds:   stroke: early stages of recovery book   Does not apply Once   acetaminophen (TYLENOL) oral liquid 160 mg/5 mL  1,000 mg Oral Q6H   aspirin EC  81 mg Oral Daily   atorvastatin  80 mg Oral Daily   bisacodyl  10 mg Oral Daily   Or   bisacodyl  10 mg Rectal Daily   Chlorhexidine Gluconate Cloth  6 each Topical Daily   enoxaparin (LOVENOX) injection  40 mg Subcutaneous Q24H   ezetimibe  10 mg Oral Daily   feeding supplement  237 mL Oral TID BM   furosemide  40 mg Intravenous BID   insulin aspart  2-6 Units Subcutaneous Q4H   insulin detemir  12 Units Subcutaneous Daily   pantoprazole (PROTONIX)  IV  40 mg Intravenous Daily   potassium chloride  20 mEq Oral Q4H   sodium chloride flush  3 mL Intravenous Q12H   tamsulosin  0.4 mg Oral BID   Continuous Infusions:  sodium chloride     amiodarone 30 mg/hr (04/04/23 0700)   ampicillin (OMNIPEN) IV Stopped (04/04/23 0440)   cefTRIAXone (ROCEPHIN)  IV Stopped (04/03/23 2320)   PRN Meds:.metoprolol tartrate, morphine injection, ondansetron (ZOFRAN) IV, oxyCODONE, sodium chloride flush, traMADol  General appearance: alert, cooperative, and no distress Neurologic: intact Heart: NSR Lungs: normal work of breathing, breath sounds clear, stable sats on 1LncO2.  Abdomen: soft, no tenderness. Extremities: well perfused, mild LE edema.   Lab Results: CBC: Recent Labs    04/03/23 0321 04/04/23 0401  WBC 18.9* 12.4*  HGB 8.2* 7.9*  HCT 26.6* 25.6*  PLT 147* 154   BMET:  Recent Labs    04/03/23 0321 04/04/23 0401  NA 145 139  K 4.0 3.7  CL 109 102  CO2 29 29  GLUCOSE 92 106*  BUN 20 16  CREATININE 0.84 0.69  CALCIUM 9.0 8.7*    CMET: Lab Results  Component Value Date   WBC 12.4 (H) 04/04/2023   HGB 7.9 (L) 04/04/2023   HCT  25.6 (L) 04/04/2023   PLT 154 04/04/2023   GLUCOSE 106 (H) 04/04/2023   CHOL 142 03/31/2023   TRIG 189 (H) 04/01/2023   HDL <10 (L) 03/31/2023   LDLCALC NOT CALCULATED 03/31/2023   ALT 29 03/29/2023   AST 49 (H) 03/29/2023   NA 139 04/04/2023   K 3.7 04/04/2023   CL 102 04/04/2023   CREATININE 0.69 04/04/2023   BUN 16 04/04/2023   CO2 29 04/04/2023   INR 1.5 (H) 03/31/2023   HGBA1C 6.2 (H) 03/29/2023      PT/INR: No results for input(s): "LABPROT", "INR" in the last 72 hours. Radiology: [place] EKG SITE RITE  Result Date: 04/03/2023 If [facility] image not attached, placement could not be confirmed due to current cardiac rhythm.    Assessment/Plan: S/P Procedure(s) (LRB): MITRAL VALVE (MV) REPLACEMENT UTILIZING SIZE 33 MOSAIC PORCINE HEART VALVE (N/A) ASCENDING AORTIC ROOT REPLACEMENT  UTILIZING 27 KONECT RESILIA  AORTIC VALVE CONDUIT (N/A) TRANSESOPHAGEAL ECHOCARDIOGRAM (N/A)  -POD4. MVR / [place] for enterococcus endocarditis. Stable BP and cardiac rhythm. Continues to progress. Continuing diuresis.  D/C chest tubes today.   -ID- WBC trending down, no fever. Recommendations per ID for another 6 weeks ceftriaxone and ampicillin beginning 6/22.  PICC requested.  Will plan to get MRI of left hip prosthesis later this week.   -GI- passing gas, no BM yet but has had limited oral intake.  Planning to try to advance his diet today.  Hoping to avoid placing a feeding tube.   -PULM- normal WOB, weaning O2. Encouraged pulm hygiene.   -Post-op a-fib- back in SR on IV amiodarone.  Continue for now until able to take oral medications.   -ENDO- no h/o DM. Good control with Levemir 12u daily and SSI.   -DVT PPX- on enoxaparin.   Leary Roca, PA-C [phone] 04/04/2023 8:30 AM  Agree with above If not able to take adequate nutrition will need feeding tube for supplementation Will need to start coumadin now that chest tubes out

## 2023-04-05 ENCOUNTER — Inpatient Hospital Stay (HOSPITAL_COMMUNITY): Payer: Medicaid Other

## 2023-04-05 LAB — GLUCOSE, CAPILLARY
Glucose-Capillary: 88 mg/dL (ref 70–99)
Glucose-Capillary: 118 mg/dL — ABNORMAL HIGH (ref 70–99)
Glucose-Capillary: 135 mg/dL — ABNORMAL HIGH (ref 70–99)
Glucose-Capillary: 98 mg/dL (ref 70–99)
Glucose-Capillary: 123 mg/dL — ABNORMAL HIGH (ref 70–99)
Glucose-Capillary: 132 mg/dL — ABNORMAL HIGH (ref 70–99)

## 2023-04-05 LAB — CBC
HCT: 28.3 % — ABNORMAL LOW (ref 39.0–52.0)
Hemoglobin: 8.5 g/dL — ABNORMAL LOW (ref 13.0–17.0)
MCH: 27.3 pg (ref 26.0–34.0)
MCHC: 30 g/dL (ref 30.0–36.0)
MCV: 91 fL (ref 80.0–100.0)
Platelets: 212 10*3/uL (ref 150–400)
RBC: 3.11 MIL/uL — ABNORMAL LOW (ref 4.22–5.81)
RDW: 20.2 % — ABNORMAL HIGH (ref 11.5–15.5)
WBC: 11.9 10*3/uL — ABNORMAL HIGH (ref 4.0–10.5)
nRBC: 0 % (ref 0.0–0.2)

## 2023-04-05 LAB — AEROBIC/ANAEROBIC CULTURE W GRAM STAIN (SURGICAL/DEEP WOUND)
Gram Stain: NONE SEEN
Gram Stain: NONE SEEN

## 2023-04-05 LAB — BASIC METABOLIC PANEL
Anion gap: 11 (ref 5–15)
BUN: 11 mg/dL (ref 6–20)
CO2: 27 mmol/L (ref 22–32)
Calcium: 8.8 mg/dL — ABNORMAL LOW (ref 8.9–10.3)
Chloride: 102 mmol/L (ref 98–111)
Creatinine, Ser: 0.7 mg/dL (ref 0.61–1.24)
GFR, Estimated: >60 mL/min (ref >60)
Glucose, Bld: 94 mg/dL (ref 70–99)
Potassium: 3.8 mmol/L (ref 3.5–5.1)
Sodium: 140 mmol/L (ref 135–145)

## 2023-04-05 LAB — PROTIME-INR
INR: 1.3 — ABNORMAL HIGH (ref 0.8–1.2)
Prothrombin Time: 16.5 seconds — ABNORMAL HIGH (ref 11.4–15.2)

## 2023-04-05 LAB — MAGNESIUM: Magnesium: 1.7 mg/dL (ref 1.7–2.4)

## 2023-04-05 MED ORDER — ACETAMINOPHEN 500 MG PO TABS
1000.0000 mg | ORAL_TABLET | Freq: Four times a day (QID) | ORAL | Status: AC
  Administered 2023-04-05 – 2023-04-06 (×5): 1000 mg via ORAL
  Filled 2023-04-05 (×5): qty 2

## 2023-04-05 MED ORDER — ORAL CARE MOUTH RINSE: 15.0000 mL | OROMUCOSAL | Status: DC | PRN

## 2023-04-05 MED ORDER — SODIUM CHLORIDE 0.9% FLUSH: 10.0000 mL | INTRAVENOUS | Status: DC | PRN

## 2023-04-05 MED ORDER — POTASSIUM CHLORIDE CRYS ER 20 MEQ PO TBCR
20.0000 meq | EXTENDED_RELEASE_TABLET | Freq: Once | ORAL | Status: AC
  Administered 2023-04-05: 20 meq via ORAL
  Filled 2023-04-05: qty 1

## 2023-04-05 MED ORDER — AMIODARONE HCL 200 MG PO TABS
400.0000 mg | ORAL_TABLET | Freq: Two times a day (BID) | ORAL | Status: DC
  Administered 2023-04-05 – 2023-04-09 (×9): 400 mg via ORAL
  Filled 2023-04-05 (×9): qty 2

## 2023-04-05 MED ORDER — FUROSEMIDE 10 MG/ML IJ SOLN
40.0000 mg | Freq: Two times a day (BID) | INTRAMUSCULAR | Status: AC
  Administered 2023-04-05 (×2): 40 mg via INTRAVENOUS
  Filled 2023-04-05 (×2): qty 4

## 2023-04-05 MED ORDER — ORAL CARE MOUTH RINSE
15.0000 mL | OROMUCOSAL | Status: DC
  Administered 2023-04-05 – 2023-04-09 (×15): 15 mL via OROMUCOSAL

## 2023-04-05 MED ORDER — WARFARIN SODIUM 5 MG PO TABS
5.0000 mg | ORAL_TABLET | Freq: Once | ORAL | Status: AC
  Administered 2023-04-05: 5 mg via ORAL
  Filled 2023-04-05: qty 1

## 2023-04-05 MED ORDER — GADOBUTROL 1 MMOL/ML IV SOLN
10.0000 mL | Freq: Once | INTRAVENOUS | Status: AC | PRN
  Administered 2023-04-05: 10 mL via INTRAVENOUS

## 2023-04-05 MED ORDER — SODIUM CHLORIDE 0.9 % IV SOLN: INTRAVENOUS | Status: DC | PRN

## 2023-04-05 MED ORDER — METOPROLOL TARTRATE 12.5 MG HALF TABLET
12.5000 mg | ORAL_TABLET | Freq: Two times a day (BID) | ORAL | Status: DC
  Administered 2023-04-05 – 2023-04-09 (×9): 12.5 mg via ORAL
  Filled 2023-04-05 (×9): qty 1

## 2023-04-05 MED ORDER — SODIUM CHLORIDE 0.9% FLUSH
10.0000 mL | Freq: Two times a day (BID) | INTRAVENOUS | Status: DC
  Administered 2023-04-05: 10 mL
  Administered 2023-04-05: 20 mL
  Administered 2023-04-06 – 2023-04-09 (×5): 10 mL

## 2023-04-05 MED ORDER — INSULIN DETEMIR 100 UNIT/ML ~~LOC~~ SOLN
8.0000 [IU] | Freq: Every day | SUBCUTANEOUS | Status: DC
  Administered 2023-04-05 – 2023-04-09 (×5): 8 [IU] via SUBCUTANEOUS
  Filled 2023-04-05 (×5): qty 0.08

## 2023-04-05 MED FILL — Sodium Chloride IV Soln 0.9%: INTRAVENOUS | Qty: 2000 | Status: AC

## 2023-04-05 MED FILL — Albumin, Human Inj 5%: INTRAVENOUS | Qty: 250 | Status: AC

## 2023-04-05 MED FILL — Lidocaine HCl Local Preservative Free (PF) Inj 2%: INTRAMUSCULAR | Qty: 14 | Status: AC

## 2023-04-05 MED FILL — Sodium Bicarbonate IV Soln 8.4%: INTRAVENOUS | Qty: 50 | Status: AC

## 2023-04-05 MED FILL — Electrolyte-R (PH 7.4) Solution: INTRAVENOUS | Qty: 3000 | Status: AC

## 2023-04-05 MED FILL — Mannitol IV Soln 20%: INTRAVENOUS | Qty: 500 | Status: AC

## 2023-04-05 MED FILL — Heparin Sodium (Porcine) Inj 1000 Unit/ML: Qty: 1000 | Status: AC

## 2023-04-05 MED FILL — Potassium Chloride Inj 2 mEq/ML: INTRAVENOUS | Qty: 40 | Status: AC

## 2023-04-05 MED FILL — Heparin Sodium (Porcine) Inj 1000 Unit/ML: INTRAMUSCULAR | Qty: 30 | Status: AC

## 2023-04-05 NOTE — Progress Notes (Signed)
NAME:  Dale Mills, MRN:  [id number], DOB:  February 16, 1966, LOS: 8 ADMISSION DATE:  03/28/2023, CONSULTATION DATE:  9/17 REFERRING MD:  August Saucer, CHIEF COMPLAINT:  endocarditis    History of Present Illness:  57 year old male patient with history as mentioned below which includes hypertension, coronary artery disease, recurrent urinary tract infections and pyelonephritis, and recent left hip arthroplasty with surgery completed back in April 2024.  Presents to the emergency room with chief complaint of nausea, lightheadedness, generalized weakness, fever and chills.  Presented from work on 9/16 with the above symptoms, initially found to be slightly hypotensive with systolic blood pressure 95 heart rate 92 white blood cell count of 20.4 hemoglobin 10.9 BUN was 39 and serum creatinine 2.6, baseline 0.6.  His BNP was 2880, UA was concerning for possible UTI chest x-ray was clear CT and abdomen was negative for acute findings a right upper quadrant ultrasound was obtained which showed cholelithiasis but no evidence of cholecystitis his heart rate continue to elevate while in the emergency room noted to be in sinus tachycardia heart rate 110s to 120s blood pressure remained in the soft 90s systolic cultures were sent he was administered IV crystalloid and 1 during resuscitation effort required increasing supplemental oxygen with progressive increased work of breathing and follow-up chest x-ray suggesting flash pulmonary edema.  He was unable to tolerate noninvasive ventilation and therefore was intubated.  His Tmax while in the emergency room was 105.8 his blood pressure at its lowest dropped to the 80s systolic.  He was started on dobutamine infusion and broad-spectrum antibiotics have been started as well working diagnosis at time of initial evaluation was sepsis/septic shock felt secondary to urinary tract source complicated by AKI, non-STEMI, and flash pulmonary edema.  He was admitted to the intensive care,  antibiotics were continued as were resuscitation efforts and biotics.  Blood cultures were positive for GPC in pairs and chains with PCR detecting e faecalis.  Transthoracic echocardiogram suggesting endocarditis because of these findings he was transferred to [facility] for definitive care  Pertinent  Medical History  Recent Left hip replacement, pyelonephritis, hypertension, depression, anxiety, coronary artery disease, prior cardiac catheterization, prior hernia repair.  Last echocardiogram in July 2024 showed EF 55 to 60%, recurrent UTIs.  Discussed this  Significant Hospital Events: Including procedures, antibiotic start and stop dates in addition to other pertinent events    9/16 admitted with septic shock initially felt urinary tract source, abx started 9/16 through 9/17 cultures positive for GPC in pairs and clusters, PCR suggesting E facialis, echocardiogram suggesting mitral valve endocarditis, transferred to [facility] TEE obtained at bedside and ccm admitting  9/20 PROCEDURES:   MITRAL VALVE (MV) REPLACEMENT  ASCENDING AORTIC ROOT REPLACEMENT UTILIZING 27 KONECT RESILIA AORTIC VALVE CONDUIT (N/A) TRANSESOPHAGEAL ECHOCARDIOGRAM (N/A)  Interim History / Subjective:  Continues to improve. Tolerating regular diet.  Objective   Blood pressure 106/73, pulse (!) 106, temperature 98.8 F (37.1 C), temperature source Oral, resp. rate (!) 22, weight 116.3 kg, SpO2 95%.        Intake/Output Summary (Last 24 hours) at 04/05/2023 0948 Last data filed at 04/05/2023 0900 Gross per 24 hour  Intake 1916.28 ml  Output 3240 ml  Net -1323.72 ml   Filed Weights   04/03/23 0500 04/04/23 0500 04/05/23 0500  Weight: 114 kg 115.6 kg 116.3 kg    Examination: No distress Heart sounds regular Moves to command Dysarthria nearly resolved Ext warm Sternotomy site healing  Patient Lines/Drains/Airways Status  Active Line/Drains/Airways     Name Placement date Placement time Site Days    Peripheral IV 03/28/23 20 G Anterior;Left Forearm 03/28/23  1400  Forearm  8   PICC Double Lumen 04/05/23 Right Brachial 42 cm 0 cm 04/05/23  0845  -- less than 1   CVC Triple Lumen 03/28/23 Right Internal jugular 03/28/23  1400  -- 8   Incision (Closed) 03/31/23 Chest 03/31/23  1251  -- 5           BMP normal CBC normal  Resolved Hospital Problem list     Assessment & Plan:   Septic shock secondary to  E Facialis bacteremia and AV/MV endocarditis, baseline aortic root dilation- s/p MVR + aortic root replacement 03/31/23 Acute hypoxic resp failure 2/2 pulmonary edema and possible element of ALI Acute renal failure secondary to septic shock Acute metabolic encephalopathy 2/2 sepsis and multifocal CVA 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

## 2023-04-05 NOTE — Progress Notes (Signed)
ANTICOAGULATION CONSULT NOTE - Initial Consult  Pharmacy Consult for warfarin Indication: atrial fibrillation  No Known Allergies  Patient Measurements: Weight: 116.3 kg (256 lb 6.3 oz) Heparin Dosing Weight: 100 kg 93.75 Vital Signs: Temp: 98.8 F (37.1 C) (09/25 0800) Temp Source: Oral (09/25 0800) BP: 106/73 (09/25 0800) Pulse Rate: 106 (09/25 0800)  Labs: Recent Labs    04/03/23 0321 04/04/23 0401 04/05/23 0500 04/05/23 0524  HGB 8.2* 7.9*  --  8.5*  HCT 26.6* 25.6*  --  28.3*  PLT 147* 154  --  212  LABPROT  --   --  16.5*  --   INR  --   --  1.3*  --   CREATININE 0.84 0.69 0.70  --     Estimated Creatinine Clearance: 132.1 mL/min (by C-G formula based on SCr of 0.7 mg/dL).   Medical History: Past Medical History:  Diagnosis Date   Angina pectoris (HCC) [place] classification 2 04/10/2020   Anxiety    Arthritis    Coronary artery disease coronary CT angio did not show any major issues, suspicion for distal disease 06/12/2020   Depression    DJD (degenerative joint disease)    Dyslipidemia 02/13/2020   Essential hypertension 02/13/2020   Precordial chest pain 02/13/2020    Medications:  Scheduled:    stroke: early stages of recovery book   Does not apply Once   acetaminophen (TYLENOL) oral liquid 160 mg/5 mL  1,000 mg Oral Q6H   amiodarone  400 mg Oral BID   aspirin EC  81 mg Oral Daily   atorvastatin  80 mg Oral Daily   bisacodyl  10 mg Oral Daily   Or   bisacodyl  10 mg Rectal Daily   Chlorhexidine Gluconate Cloth  6 each Topical Daily   ezetimibe  10 mg Oral Daily   feeding supplement  237 mL Oral TID BM   furosemide  40 mg Intravenous BID   insulin aspart  2-6 Units Subcutaneous Q4H   insulin detemir  12 Units Subcutaneous Daily   metoprolol tartrate  12.5 mg Oral BID   mouth rinse  15 mL Mouth Rinse 4 times per day   pantoprazole (PROTONIX) IV  40 mg Intravenous Daily   sodium chloride flush  10-40 mL Intracatheter Q12H   sodium chloride  flush  3 mL Intravenous Q12H   tamsulosin  0.4 mg Oral BID   Warfarin - Pharmacist Dosing Inpatient   Does not apply q1600   Infusions:   sodium chloride     ampicillin (OMNIPEN) IV Stopped (04/05/23 0525)   cefTRIAXone (ROCEPHIN)  IV Stopped (04/04/23 2147)    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 Having recurrent Afib postop, pharmacy now consulted to start warfarin for Afib.   INR of 1.3 is subtherapeutic after warfarin 5 mg dose yesterday. Patient eating ~ 20% of meals, 1 Ensure yesterday. No new DDIs identified, patient remains on amiodarone and ampicillin which may increase serum warfarin. CBC is stable.  Goal of Therapy:  INR 2-3 Monitor platelets by anticoagulation protocol: Yes   Plan:  Give warfarin 5 mg x1 Monitor daily INR Monitor for signs/symptoms of bleeding  Romie Minus, PharmD PGY1 Pharmacy Resident  Please check AMION for all [facility] Pharmacy phone numbers After 10:00 PM, call Main Pharmacy [phone] 04/05/2023,9:39 AM

## 2023-04-05 NOTE — Evaluation (Signed)
Speech Language Pathology Evaluation Patient Details Name: Dale Mills MRN: [id number] DOB: 15-Aug-1965 Today's Date: 04/05/2023 Time: 2595-6387 SLP Time Calculation (min) (ACUTE ONLY): 14 min  Problem List:  Patient Active Problem List   Diagnosis Date Noted   S/P aortic valve replacement and aortoplasty 03/31/2023   Protein-calorie malnutrition, severe 03/30/2023   Acute respiratory failure (HCC) 03/29/2023   Septic shock (HCC) 03/29/2023   Enterococcal bacteremia 03/29/2023   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   Iron deficiency anemia 03/20/2023   Coronary artery disease coronary CT angio did not show any major issues, suspicion for distal disease 06/12/2020   Angina pectoris (HCC) Canadian classification 2 04/10/2020   Precordial chest pain 02/13/2020   Essential hypertension 02/13/2020   Dyslipidemia 02/13/2020   Past Medical History:  Past Medical History:  Diagnosis Date   Angina pectoris (HCC) Canadian classification 2 04/10/2020   Anxiety    Arthritis    Coronary artery disease coronary CT angio did not show any major issues, suspicion for distal disease 06/12/2020   Depression    DJD (degenerative joint disease)    Dyslipidemia 02/13/2020   Essential hypertension 02/13/2020   Precordial chest pain 02/13/2020   Past Surgical History:  Past Surgical History:  Procedure Laterality Date   ASCENDING AORTIC ROOT REPLACEMENT N/A 03/31/2023   Procedure: ASCENDING AORTIC ROOT REPLACEMENT UTILIZING 27 KONECT RESILIA  AORTIC VALVE CONDUIT;  Surgeon: Eugenio Hoes, MD;  Location: [facility] OR;  Service: Open Heart Surgery;  Laterality: N/A;   COLONOSCOPY W/ POLYPECTOMY     FOREARM SURGERY     MASS REMOVED   HIP ARTHROSCOPY Left    LASIK Bilateral    MITRAL VALVE REPLACEMENT N/A 03/31/2023   Procedure: MITRAL VALVE (MV) REPLACEMENT UTILIZING SIZE 33 MOSAIC PORCINE HEART VALVE;  Surgeon: Eugenio Hoes, MD;  Location: [facility] OR;  Service: Open Heart Surgery;  Laterality: N/A;    TEE WITHOUT CARDIOVERSION N/A 03/31/2023   Procedure: TRANSESOPHAGEAL ECHOCARDIOGRAM;  Surgeon: Eugenio Hoes, MD;  Location: [facility] OR;  Service: Open Heart Surgery;  Laterality: N/A;   TONSILECTOMY, ADENOIDECTOMY, BILATERAL MYRINGOTOMY AND TUBES  2011   UMBILICAL HERNIA REPAIR     VASECTOMY     HPI:  57 y.o. 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 MV replacement + aortic root replacement  9/20; MRI 9/18: multifocal CVA-  bilateral small ceribellar/parietal, R PCA larger infarct. PMH includes CAD, anxiety, arthritis, dyslipidemia, HTN, recent hip arthoplasty 4/24.   Assessment / Plan / Recommendation Clinical Impression  Pt demonstrates moderate cognitive impairments s/p assessment. He exhibits left CN VII impairments with tongue sllightly deviated to the right on protrusion. He was conversant with appropriate receptive and expressive language with minimal and intermittent dysarthria due to articulatory imprecision however 100% intelligible. He stated he was confused and aware he scored lower on the assessment. Pt scored a 16/30 on the SLUMS with impairments in declarative memory recalling 3/5 words (correct with cues suggestive of retrieval deficit). Pt had difficulty with divergent naming 8 items in one minute. Inaccurate clock drawing with visual disturbance on the left and pt states his vision is blurry. Pt with mildly delayed processing at times and needed cues to recall information with questions on assessment. Pt would benefit from continued ST on acute and from inpatient rehab > 3 hours therapy a day.    SLP Assessment  SLP Recommendation/Assessment: Patient needs continued Speech [facility] Pathology Services  SLP Visit Diagnosis: Cognitive communication deficit (R41.841)    Recommendations for follow up therapy are one component of a  multi-disciplinary discharge planning process, led by the attending physician.  Recommendations may be updated based on patient status, additional functional criteria and insurance authorization.    Follow Up Recommendations  Acute inpatient rehab (3hours/day)    Assistance Recommended at Discharge  Frequent or constant Supervision/Assistance  Functional Status Assessment Patient has had a recent decline in their functional status and demonstrates the ability to make significant improvements in function in a reasonable and predictable amount of time.  Frequency and Duration min 2x/week  2 weeks      SLP Evaluation Cognition  Overall Cognitive Status: Impaired/Different from baseline Arousal/Alertness: Awake/alert Orientation Level: Oriented to person;Oriented to place;Oriented to time Year: 2024 Day of Week: Correct Attention: Sustained Sustained Attention: Appears intact Memory: Impaired Memory Impairment: Retrieval deficit (3/5 words) Awareness: Impaired Awareness Impairment: Intellectual impairment;Emergent impairment Problem Solving: Impaired Safety/Judgment: Impaired       Comprehension  Auditory Comprehension Overall Auditory Comprehension: Appears within functional limits for tasks assessed Visual Recognition/Discrimination Discrimination: Not tested Reading Comprehension Reading Status:  (TBA)    Expression Expression Primary Mode of Expression: Verbal Verbal Expression Overall Verbal Expression: Appears within functional limits for tasks assessed Initiation: No impairment Level of Generative/Spontaneous Verbalization: Conversation Repetition:  (NT) Naming: Impairment Divergent: 50-74% accurate Pragmatics: No impairment Written Expression Dominant Hand: Left Written Expression: Not tested   Oral / Motor  Oral Motor/Sensory Function Overall Oral Motor/Sensory Function: Mild impairment Facial ROM: Reduced left;Suspected CN VII (facial) dysfunction Facial  Symmetry: Abnormal symmetry left;Suspected CN VII (facial) dysfunction Lingual Symmetry: Abnormal symmetry right;Suspected CN XII (hypoglossal) dysfunction Mandible: Within Functional Limits Motor Speech Overall Motor Speech: Impaired Respiration: Within functional limits Phonation: Normal Resonance: Within functional limits Articulation: Impaired Level of Impairment: Conversation (slightly distorted) Intelligibility: Intelligible Motor Planning: Witnin functional limits Motor Speech Errors: Not applicable            [name] 04/05/2023, 3:06 PM

## 2023-04-05 NOTE — Plan of Care (Signed)
Problem: Education: Goal: Knowledge of General Education information will improve Description: Including pain rating scale, medication(s)/side effects and non-pharmacologic comfort measures Outcome: Progressing   Problem: Health Behavior/Discharge Planning: Goal: Ability to manage health-related needs will improve Outcome: Progressing   Problem: Clinical Measurements: Goal: Ability to maintain clinical measurements within normal limits will improve Outcome: Progressing Goal: Respiratory complications will improve Outcome: Progressing   Problem: Activity: Goal: Risk for activity intolerance will decrease Outcome: Progressing   Problem: Nutrition: Goal: Adequate nutrition will be maintained Outcome: Progressing   Problem: Coping: Goal: Level of anxiety will decrease Outcome: Progressing   Problem: Elimination: Goal: Will not experience complications related to bowel motility Outcome: Progressing Goal: Will not experience complications related to urinary retention Outcome: Progressing   Problem: Pain Managment: Goal: General experience of comfort will improve Outcome: Progressing

## 2023-04-05 NOTE — Progress Notes (Signed)
   04/05/23 1000  Spiritual Encounters  Type of Visit Initial  Care provided to: Patient  Referral source Patient request  Reason for visit Advance directives  OnCall Visit No   Chaplain has responded to the request to notarize the AD. Pt's daughters were not in the room. Pt will notify the chaplain when his daughters arrive.   M.Kubra Magalene Mclear, MA Chaplain Intern [phone]

## 2023-04-05 NOTE — Progress Notes (Addendum)
Occupational Therapy Treatment Patient Details Name: Dale Mills MRN: [id number] DOB: 09/23/1965 Today's Date: 04/05/2023   History of present illness Pt is a 57 y/o M presenting to [facility] with fever, nausea, chills, hypotensive, CXR with flash pulmonary edema, intubated. MRI head obtained due to persistent encephalopathy and found to have R PCA territory occipital lobe CVA with small acute infarcts in bilateral parietal lobes/cerebellum, small acute SA hemorrhage.Transferred to [facility] for severe mitral valve endocarditis s/p MVR on 9/20.  PMH includes HTN, CAD, recurrent UTI, pyelonephritis, L THA.   OT comments  Pt progressing towards goals, still with decr cognition and inattention to L side. Pt needing min -mod A for ADLs, min A for bed mobility and mod A +2 for transfers with Carley Hammed walker. Pt able to stand at sink for grooming task, needed cues for sequencing and clean up, pt verbalizing steps out loud during task. Pt able to complete hallway distance ambulation, able to identify sign on wall to R side without difficulty, missed room # on L side, and read incorrectly when therapist pointed out sign. Pt presenting with impairments listed below, will follow acutely. Patient will benefit from intensive inpatient follow up therapy, >3 hours/day to maximize safety/ind with ADLs/functional mobility.       If plan is discharge home, recommend the following:  Two people to help with walking and/or transfers;A lot of help with bathing/dressing/bathroom;Assistance with cooking/housework;Direct supervision/assist for medications management;Direct supervision/assist for financial management;Assist for transportation;Help with stairs or ramp for entrance;Supervision due to cognitive status   Equipment Recommendations  Other (comment) (defer)    Recommendations for Other Services PT consult;Rehab consult    Precautions / Restrictions Precautions Precautions: Sternal;Fall  Precaution Booklet  Issued: No Precaution Comments: pt able to recall precautions however requires constant cues to adhere to them functionally       Mobility Bed Mobility Overal bed mobility: Needs Assistance Bed Mobility: Sit to Sidelying   Sidelying to sit: Min assist Supine to sit: Min assist     General bed mobility comments: cues for sternal prec    Transfers Overall transfer level: Needs assistance Equipment used:  (eva walker) Transfers: Sit to/from Stand, Bed to chair/wheelchair/BSC Sit to Stand: Mod assist, +2 safety/equipment           General transfer comment: use of heart pillow to stand     Balance Overall balance assessment: Needs assistance Sitting-balance support: Feet supported Sitting balance-Leahy Scale: Fair     Standing balance support: During functional activity, Reliant on assistive device for balance Standing balance-Leahy Scale: Fair Standing balance comment: dependent on external support                           ADL either performed or assessed with clinical judgement   ADL Overall ADL's : Needs assistance/impaired     Grooming: Minimal assistance;Sitting Grooming Details (indicate cue type and reason): cues for sequencing         Upper Body Dressing : Minimal assistance;Sitting Upper Body Dressing Details (indicate cue type and reason): donning gown     Toilet Transfer: Moderate assistance;+2 for safety/equipment Toilet Transfer Details (indicate cue type and reason): with Carley Hammed walker         Functional mobility during ADLs: Moderate assistance;+2 for safety/equipment      Extremity/Trunk Assessment Upper Extremity Assessment Upper Extremity Assessment: RUE deficits/detail;LUE deficits/detail RUE Deficits / Details: global weakness, but WFL for basic ROM within limits of sternal prec  RUE Coordination: decreased fine motor LUE Deficits / Details: global weakness compared to RUE, decr coordination during functional reach task LUE  Coordination: decreased fine motor   Lower Extremity Assessment Lower Extremity Assessment: Defer to PT evaluation        Vision   Vision Assessment?: Yes Ocular Range of Motion: Restricted on the left;Impaired-to be further tested in functional context Alignment/Gaze Preference: Head tilt;Gaze right Tracking/Visual Pursuits: Decreased smoothness of horizontal tracking;Decreased smoothness of vertical tracking;Requires cues, head turns, or add eye shifts to track Saccades: Undershoots Visual Fields: Impaired-to be further tested in functional context;Left visual field deficit Depth Perception: Undershoots Additional Comments: difficulty identifying room #s in L visual field during hallway ambulation   Perception Perception Perception: Not tested   Praxis Praxis Praxis: Not tested    Cognition Arousal: Alert Behavior During Therapy: WFL for tasks assessed/performed Overall Cognitive Status: Impaired/Different from baseline Area of Impairment: Attention, Memory, Following commands, Safety/judgement, Awareness, Problem solving, Orientation                   Current Attention Level: Sustained Memory: Decreased short-term memory, Decreased recall of precautions Following Commands: Follows one step commands with increased time Safety/Judgement: Decreased awareness of deficits Awareness: Emergent Problem Solving: Slow processing, Requires verbal cues, Difficulty sequencing, Decreased initiation General Comments: slow processing, incr cues for safety, able to recall going to MRI earlier today, questions why he has to go again        Exercises      Shoulder Instructions       General Comments VSS on RA    Pertinent Vitals/ Pain       Pain Assessment Pain Assessment: No/denies pain  Home Living       Type of Home: House                                  Prior Functioning/Environment              Frequency  Min 1X/week        Progress  Toward Goals  OT Goals(current goals can now be found in the care plan section)  Progress towards OT goals: Progressing toward goals  Acute Rehab OT Goals Patient Stated Goal: none stated OT Goal Formulation: With patient Time For Goal Achievement: 04/17/23 Potential to Achieve Goals: Good ADL Goals Pt Will Perform Upper Body Dressing: with min assist;sitting Pt Will Perform Lower Body Dressing: with mod assist;sit to/from stand;sitting/lateral leans Pt Will Transfer to Toilet: with min assist;squat pivot transfer;stand pivot transfer;bedside commode Additional ADL Goal #1: pt will demonstrate visual scanning techniques in order to identify items in L visual field for ADLs Additional ADL Goal #2: pt will perform 3 functional tasks with min cues for sternal precautions  Plan      Co-evaluation    PT/OT/SLP Co-Evaluation/Treatment: Yes Reason for Co-Treatment: Complexity of the patient's impairments (multi-system involvement);Necessary to address cognition/behavior during functional activity;To address functional/ADL transfers  PT goals addressed during session: Mobility/safety with mobility OT goals addressed during session: ADL's and self-care      AM-PAC OT "6 Clicks" Daily Activity     Outcome Measure   Help from another person eating meals?: A Little Help from another person taking care of personal grooming?: A Little Help from another person toileting, which includes using toliet, bedpan, or urinal?: A Lot Help from another person bathing (including washing, rinsing, drying)?: A Lot Help from another  person to put on and taking off regular upper body clothing?: A Lot Help from another person to put on and taking off regular lower body clothing?: A Lot 6 Click Score: 14    End of Session Equipment Utilized During Treatment: Gait belt;Other (comment) Carley Hammed walker)  OT Visit Diagnosis: Unsteadiness on feet (R26.81);Other abnormalities of gait and mobility (R26.89);Muscle  weakness (generalized) (M62.81);Other symptoms and signs involving the nervous system (R29.898);Other symptoms and signs involving cognitive function   Activity Tolerance Patient tolerated treatment well   Patient Left in bed;with call bell/phone within reach;with bed alarm set (pt going to MRI)   Nurse Communication Mobility status        Time: 6295-2841 OT Time Calculation (min): 28 min  Charges: OT General Charges $OT Visit: 1 Visit OT Treatments $Self Care/Home Management : 8-22 mins  Carver Fila, OTD, OTR/L SecureChat Preferred Acute Rehab [phone]   Carver Fila Koonce 04/05/2023, 5:25 PM

## 2023-04-05 NOTE — Progress Notes (Signed)
Peripherally Inserted Central Catheter Placement  The IV Nurse has discussed with the patient and/or persons authorized to consent for the patient, the purpose of this procedure and the potential benefits and risks involved with this procedure.  The benefits include less needle sticks, lab draws from the catheter, and the patient may be discharged home with the catheter. Risks include, but not limited to, infection, bleeding, blood clot (thrombus formation), and puncture of an artery; nerve damage and irregular heartbeat and possibility to perform a PICC exchange if needed/ordered by physician.  Alternatives to this procedure were also discussed.  Bard Power PICC patient education guide, fact sheet on infection prevention and patient information card has been provided to patient /or left at bedside.    PICC Placement Documentation  PICC Double Lumen 04/05/23 Right Brachial 42 cm 0 cm (Active)  Indication for Insertion or Continuance of Line Prolonged intravenous therapies 04/05/23 0800  Exposed Catheter (cm) 0 cm 04/05/23 0800  Site Assessment Clean, Dry, Intact 04/05/23 0800  Lumen #1 Status Flushed;Saline locked;Blood return noted 04/05/23 0800  Lumen #2 Status Flushed;Saline locked;Blood return noted 04/05/23 0800  Dressing Type Transparent;Securing device 04/05/23 0800  Dressing Status Antimicrobial disc in place;Clean, Dry, Intact 04/05/23 0800  Line Care Connections checked and tightened 04/05/23 0800  Line Adjustment (NICU/IV Team Only) No 04/05/23 0800  Dressing Intervention New dressing 04/05/23 0800  Dressing Change Due 04/12/23 04/05/23 0800       Franne Grip Renee 04/05/2023, 8:50 AM

## 2023-04-05 NOTE — Progress Notes (Signed)
[facility]    Date of Admission:  03/28/2023   Total days of antibiotics 4   ID: Dale Mills is a 57 y.o. 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 Principal Problem:   Endocarditis of mitral valve Active Problems:   Acute respiratory failure (HCC)   Septic shock (HCC)   Enterococcal bacteremia   Protein-calorie malnutrition, severe   S/P aortic valve replacement and aortoplasty    Subjective: Afebrile, chest tube removed, and right arm picc line placed without difficulty. Sitting up in chair and was able to ambulate alittle  bit today.   Medications:    stroke: early stages of recovery book   Does not apply Once   acetaminophen (TYLENOL) oral liquid 160 mg/5 mL  1,000 mg Oral Q6H   amiodarone  400 mg Oral BID   aspirin EC  81 mg Oral Daily   atorvastatin  80 mg Oral Daily   bisacodyl  10 mg Oral Daily   Or   bisacodyl  10 mg Rectal Daily   Chlorhexidine Gluconate Cloth  6 each Topical Daily   ezetimibe  10 mg Oral Daily   feeding supplement  237 mL Oral TID BM   furosemide  40 mg Intravenous BID   insulin aspart  2-6 Units Subcutaneous Q4H   insulin detemir  8 Units Subcutaneous Daily   metoprolol tartrate  12.5 mg Oral BID   mouth rinse  15 mL Mouth Rinse 4 times per day   pantoprazole (PROTONIX) IV  40 mg Intravenous Daily   sodium chloride flush  10-40 mL Intracatheter Q12H   sodium chloride flush  3 mL Intravenous Q12H   tamsulosin  0.4 mg Oral BID   warfarin  5 mg Oral ONCE-1600   Warfarin - Pharmacist Dosing Inpatient   Does not apply q1600    Objective: Vital signs in last 24 hours: Temp:  [98.6 F (37 C)-99.4 F (37.4 C)] 98.9 F (37.2 C) (09/25 1130) Pulse Rate:  [86-113] 106 (09/25 0800) Resp:  [9-24] 22 (09/25 0800) BP: (99-131)/(73-87) 106/73 (09/25 0800) SpO2:  [87 %-98 %] 95 % (09/25 0800) Weight:  [116.3 kg] 116.3 kg (09/25 0500)  Physical Exam   Constitutional: He is oriented to person, place, and time. He appears well-developed and well-nourished. No distress.  HENT: right ij Mouth/Throat: Oropharynx is clear and moist. No oropharyngeal exudate.  Cardiovascular: Normal rate, regular rhythm and normal heart sounds. Exam reveals no gallop and no friction rub.  No murmur heard. Midline incision is c/d/i Pulmonary/Chest: Effort normal and breath sounds normal. No respiratory distress. He has no wheezes.  Abdominal: Soft. Bowel sounds are normal. He exhibits no distension. There is no tenderness.  ZOX:WRUEA picc line is c/d/i Neurological: He is alert and oriented to person, place, and time.  Skin: Skin is warm and dry. No rash noted. No erythema.  Psychiatric: He has a normal mood and affect. His behavior is normal.    Lab Results Recent Labs    04/04/23 0401 04/05/23 0500 04/05/23 0524  WBC 12.4*  --  11.9*  HGB 7.9*  --  8.5*  HCT 25.6*  --  28.3*  NA 139 140  --   K 3.7 3.8  --   CL 102 102  --   CO2 29 27  --   BUN 16 11  --   CREATININE 0.69 0.70  --    Liver Panel  No results for input(s): "PROT", "ALBUMIN", "AST", "ALT", "ALKPHOS", "BILITOT", "BILIDIR", "IBILI" in the last 72 hours. Sedimentation Rate No results for input(s): "ESRSEDRATE" in the last 72 hours. C-Reactive Protein No results for input(s): "CRP" in the last 72 hours.  Microbiology: 9/20 mv and av tissue cx = enterococcus + Studies/Results: DG CHEST PORT 1 VIEW  Result Date: 04/05/2023 CLINICAL DATA:  Postoperative status. EXAM: PORTABLE CHEST 1 VIEW COMPARISON:  April 03, 2023. FINDINGS: Stable cardiomegaly. Status post aortic valve repair. Right internal jugular catheter is unchanged. Bibasilar atelectasis is noted with possible small left pleural effusion. Left-sided chest tube has been removed without pneumothorax. IMPRESSION: Bibasilar atelectasis with small left pleural effusion. Electronically Signed   By: Lupita Raider M.D.   On:  04/05/2023 10:40   [place] EKG SITE RITE  Result Date: 04/03/2023 If Site Rite image not attached, placement could not be confirmed due to current cardiac rhythm.    Assessment/Plan: Enterococcal aortic and mitral valve endocarditis = continue with dual therapy with ampicillin and  ceftriaxone 2gm IV Q 12h via picc line.  Can d/c right internal jugular  Hx of recent left THA, with pain = will get mri w and wo contrast to evaluate for any signs concerning for PJI  Constipation = recommend bisacodyl suppository in addition to oral bowel regimen   [facility] Infectious Diseases Pager: [phone]  04/05/2023, 2:06 PM

## 2023-04-05 NOTE — Progress Notes (Signed)
Physical Therapy Treatment Patient Details Name: Dale Mills MRN: [id number] DOB: 1965/08/03 Today's Date: 04/05/2023   History of Present Illness Pt is a 57 y/o M presenting to [facility] with fever, nausea, chills, hypotensive, CXR with flash pulmonary edema, intubated. 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  PMH includes HTN, CAD, recurrent UTI, pyelonephritis, L THA    PT Comments  Pt has shown good progress toward goals since last treatment, but still shows little insight into his deficits and is slow to process tasks.  Emphasis on transitions to EOB based on sternal prec, scooting, work on sit to stand technique, standing activity at the sink with CGA/min assist and up to mod assist cuing for sequencing of tasks with conclusion of session with ambulation in the EVA walker with mod assist for stability and maneuvering the EVA with +2 for safety and lines.     If plan is discharge home, recommend the following: A little help with walking and/or transfers;A lot of help with bathing/dressing/bathroom;Assistance with cooking/housework;Direct supervision/assist for medications management;Direct supervision/assist for financial management;Assist for transportation   Can travel by private vehicle        Equipment Recommendations  Other (comment) (TBD)    Recommendations for Other Services Rehab consult     Precautions / Restrictions Precautions Precautions: Sternal;Fall Precaution Booklet Issued: No Precaution Comments: pt able to recall precautions however requires constant cues to adhere to them functionally     Mobility  Bed Mobility Overal bed mobility: Needs Assistance Bed Mobility: Supine to Sit   Sidelying to sit: Min assist Supine to sit:  Mod assist, +2 for physical assistance     General bed mobility comments: pt maneuvered LE's off bed.  Assisted trunk up and forward.  LE assist to supine.  Pt able to scoot to EOB without assist.    Transfers Overall transfer level: Needs assistance   Transfers: Sit to/from Stand Sit to Stand: Mod assist, +2 physical assistance           General transfer comment: use of heart pillow to stand    Ambulation/Gait Ambulation/Gait assistance: Mod assist, +2 safety/equipment Gait Distance (Feet): 150 Feet Assistive device: Fara Boros Gait Pattern/deviations: Step-through pattern, Decreased stride length, Narrow base of support, Drifts right/left, Scissoring   Gait velocity interpretation: <1.31 ft/sec, indicative of household ambulator   General Gait Details: generally unstead with cross-over/scissoring or narrowed BOS.  weak heel/toe on the R.  Pt needed stability assist and often help controlling the EVA walker.   Stairs             Wheelchair Mobility     Tilt Bed    Modified Rankin (Stroke Patients Only)       Balance Overall balance assessment: Needs assistance Sitting-balance support: Feet supported Sitting balance-Leahy Scale: Fair     Standing balance support: During functional activity, Reliant on assistive device for balance Standing balance-Leahy Scale: Fair Standing balance comment: dependent on external support                            Cognition Arousal: Alert Behavior During Therapy: WFL for tasks assessed/performed Overall Cognitive Status: Impaired/Different from baseline Area of Impairment: Attention, Memory, Following commands, Safety/judgement, Awareness, Problem solving, Orientation  Current Attention Level: Sustained Memory: Decreased short-term memory, Decreased recall of precautions Following Commands: Follows one step commands with increased time Safety/Judgement: Decreased awareness of  deficits Awareness: Intellectual Problem Solving: Slow processing, Requires verbal cues, Difficulty sequencing, Decreased initiation General Comments: requires increased time to process        Exercises      General Comments General comments (skin integrity, edema, etc.): pt completed a task standing at the sink min to CGA, with/without 1 UE stability assist for over 10 min.      Pertinent Vitals/Pain Pain Assessment Pain Assessment: No/denies pain    Home Living       Type of Home: House                  Prior Function            PT Goals (current goals can now be found in the care plan section) Acute Rehab PT Goals PT Goal Formulation: With patient Time For Goal Achievement: 04/17/23 Potential to Achieve Goals: Good Progress towards PT goals: Progressing toward goals    Frequency    Min 1X/week      PT Plan      Co-evaluation PT/OT/SLP Co-Evaluation/Treatment: Yes Reason for Co-Treatment: Complexity of the patient's impairments (multi-system involvement);Necessary to address cognition/behavior during functional activity;To address functional/ADL transfers PT goals addressed during session: Mobility/safety with mobility OT goals addressed during session: ADL's and self-care      AM-PAC PT "6 Clicks" Mobility   Outcome Measure  Help needed turning from your back to your side while in a flat bed without using bedrails?: A Little Help needed moving from lying on your back to sitting on the side of a flat bed without using bedrails?: Total Help needed moving to and from a bed to a chair (including a wheelchair)?: Total Help needed standing up from a chair using your arms (e.g., wheelchair or bedside chair)?: A Lot Help needed to walk in hospital room?: Total Help needed climbing 3-5 steps with a railing? : Total 6 Click Score: 9    End of Session   Activity Tolerance: Patient tolerated treatment well;Patient limited by fatigue Patient left: with  call bell/phone within reach;with nursing/sitter in room;in bed Nurse Communication: Mobility status PT Visit Diagnosis: Difficulty in walking, not elsewhere classified (R26.2)     Time: 3086-5784 PT Time Calculation (min) (ACUTE ONLY): 28 min  Charges:    $Gait Training: 8-22 mins PT General Charges $$ ACUTE PT VISIT: 1 Visit                     04/05/2023  Jacinto Halim., PT Acute Rehabilitation Services [phone]  (office)   Eliseo Gum Envy Meno 04/05/2023, 6:32 PM

## 2023-04-05 NOTE — Progress Notes (Signed)
Speech Language Pathology Treatment: Dysphagia  Patient Details Name: Dale Mills MRN: [id number] DOB: [birth year] Today's Date: 04/05/2023 Time: 8413-2440 SLP Time Calculation (min) (ACUTE ONLY): 8 min  Assessment / Plan / Recommendation Clinical Impression  Pt seen for swallow intervention. Initial assessment recommended full liquids due to nausea and pt's diet texture has since been upgraded to regular. Pt denies difficulty and stated he consumed a hamburger for lunch without problems.  He consumed multiple trials of thin with straw without s/s aspiration. Masticated regular texture swiftly with adequate transit and no oral residue. He denies any nausea and stated that was several days ago. Recommend he continue regular texture, thin liquids, pills whole in puree or with thin if able. No further ST needed for swallow.   Pt has had multifocal CVA per MRI 9/18 and speech-language-cognitive eval is recommended.    HPI HPI: 57 y.o. 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 MV replacement + aortic root replacement  9/20; MRI 9/18: multifocal CVA-  bilateral small ceribellar/parietal, R PCA larger infarct. PMH includes CAD, anxiety, arthritis, dyslipidemia, HTN, recent hip arthoplasty 4/24.      SLP Plan  Discharge SLP treatment due to (comment);All goals met      Recommendations for follow up therapy are one component of a multi-disciplinary discharge planning process, led by the attending physician.  Recommendations may be updated based on patient status, additional functional criteria and insurance authorization.    Recommendations  Diet recommendations: Regular;Thin liquid Liquids provided via: Cup;Straw Medication Administration: Whole meds with puree Supervision: Patient able to self feed Compensations: Slow rate;Small  sips/bites Postural Changes and/or Swallow Maneuvers: Seated upright 90 degrees                  Oral care BID   Intermittent Supervision/Assistance Dysphagia, unspecified (R13.10)     Discharge SLP treatment due to (comment);All goals met     [name]  04/05/2023, 2:02 PM

## 2023-04-05 NOTE — Progress Notes (Signed)
   04/05/23 1417  Spiritual Encounters  Type of Visit Follow up  Care provided to: Patient  Conversation partners present during encounter Other (comment) (Notary and 2 Witnesses)  Reason for visit Advance directives  OnCall Visit No   Chaplain returned to room to check over Pt Advance Directive.  Paperwork was completed and ready for notarization.  Chaplain obtained 2 witnesses and the notary and had document signed  and notarized. Paperwork scanned to system, copy filed in Pt paper chart, original returned to Pt along with 2 copies for the proxies.  Chaplain services remain available by Spiritual Consult or for emergent cases, paging [phone]  Chaplain Raelene Bott, MDiv [email or web address] [phone]

## 2023-04-05 NOTE — Progress Notes (Signed)
      [address]       [place] [address]             [phone]      5 Days Post-Op  Procedure(s) (LRB): MITRAL VALVE (MV) REPLACEMENT UTILIZING SIZE 33 MOSAIC PORCINE HEART VALVE (N/A) ASCENDING AORTIC ROOT REPLACEMENT UTILIZING 27 KONECT RESILIA  AORTIC VALVE CONDUIT (N/A) TRANSESOPHAGEAL ECHOCARDIOGRAM (N/A)   Total Length of Stay:  LOS: 8 days    SUBJECTIVE: Eating better Walked the entire unit No complaints  Vitals:   04/05/23 0600 04/05/23 0700  BP: 126/76 116/86  Pulse: (!) 113 (!) 104  Resp: (!) 21 (!) 21  Temp:    SpO2: 97% 94%    Intake/Output      09/24 0701 09/25 0700 09/25 0701 09/26 0700   P.O. 720    I.V. (mL/kg) 386.3 (3.3)    IV Piggyback 1000    Total Intake(mL/kg) 2106.3 (18.1)    Urine (mL/kg/hr) 3140 (1.1)    Chest Tube 100    Total Output 3240    Net -1133.7             sodium chloride     ampicillin (OMNIPEN) IV Stopped (04/05/23 0525)   cefTRIAXone (ROCEPHIN)  IV Stopped (04/04/23 2147)    CBC    Component Value Date/Time   WBC 11.9 (H) 04/05/2023 0524   RBC 3.11 (L) 04/05/2023 0524   HGB 8.5 (L) 04/05/2023 0524   HCT 28.3 (L) 04/05/2023 0524   PLT 212 04/05/2023 0524   MCV 91.0 04/05/2023 0524   MCH 27.3 04/05/2023 0524   MCHC 30.0 04/05/2023 0524   RDW 20.2 (H) 04/05/2023 0524   CMP     Component Value Date/Time   NA 140 04/05/2023 0500   K 3.8 04/05/2023 0500   CL 102 04/05/2023 0500   CO2 27 04/05/2023 0500   GLUCOSE 94 04/05/2023 0500   BUN 11 04/05/2023 0500   CREATININE 0.70 04/05/2023 0500   CREATININE 1.38 (H) 03/17/2023 0851   CALCIUM 8.8 (L) 04/05/2023 0500   PROT 5.7 (L) 03/29/2023 0304   PROT 6.4 11/13/2020 1330   ALBUMIN 2.0 (L) 03/29/2023 0304   ALBUMIN 4.0 11/13/2020 1330   AST 49 (H) 03/29/2023 0304   AST 23 03/17/2023 0851   ALT 29 03/29/2023 0304   ALT 27 03/17/2023 0851   ALKPHOS 104 03/29/2023 0304   BILITOT 2.2 (H) 03/29/2023 0304   BILITOT 1.0 03/17/2023 0851    GFRNONAA >60 04/05/2023 0500   GFRNONAA 60 (L) 03/17/2023 0851   ABG    Component Value Date/Time   PHART 7.371 04/01/2023 1401   PCO2ART 39.9 04/01/2023 1401   PO2ART 108 04/01/2023 1401   HCO3 23.1 04/01/2023 1401   TCO2 24 04/01/2023 1401   ACIDBASEDEF 2.0 04/01/2023 1401   O2SAT 98 04/01/2023 1401   CBG (last 3)  Recent Labs    04/04/23 2257 04/05/23 0500 04/05/23 0731  GLUCAP 91 88 118*  Lungs: clear Card: RR soft murmur Ext: warm with 1+ edema Neuro: alert   ASSESSMENT: POD #5 SP Bental/MVR Hemodynamics ok. Will start bb and switch to po amiodarone Pulmonary: on RA  will encourage ambulation and ISB Renal: continue diuresis For PICC today and once in to remove neck line Will need rehab (started Medicaid application) Started coumadin Hopefully to floor tomorrow   Eugenio Hoes, MD 04/05/2023

## 2023-04-06 ENCOUNTER — Other Ambulatory Visit: Payer: Self-pay | Admitting: Cardiology

## 2023-04-06 ENCOUNTER — Inpatient Hospital Stay (HOSPITAL_COMMUNITY): Payer: Medicaid Other

## 2023-04-06 DIAGNOSIS — Z952 Presence of prosthetic heart valve: Secondary | ICD-10-CM

## 2023-04-06 DIAGNOSIS — M7989 Other specified soft tissue disorders: Secondary | ICD-10-CM

## 2023-04-06 LAB — BASIC METABOLIC PANEL
Anion gap: 7 (ref 5–15)
BUN: 12 mg/dL (ref 6–20)
CO2: 30 mmol/L (ref 22–32)
Calcium: 8.6 mg/dL — ABNORMAL LOW (ref 8.9–10.3)
Chloride: 103 mmol/L (ref 98–111)
Creatinine, Ser: 0.71 mg/dL (ref 0.61–1.24)
GFR, Estimated: >60 mL/min (ref >60)
Glucose, Bld: 91 mg/dL (ref 70–99)
Potassium: 3.4 mmol/L — ABNORMAL LOW (ref 3.5–5.1)
Sodium: 140 mmol/L (ref 135–145)

## 2023-04-06 LAB — GLUCOSE, CAPILLARY
Glucose-Capillary: 120 mg/dL — ABNORMAL HIGH (ref 70–99)
Glucose-Capillary: 89 mg/dL (ref 70–99)
Glucose-Capillary: 145 mg/dL — ABNORMAL HIGH (ref 70–99)
Glucose-Capillary: 147 mg/dL — ABNORMAL HIGH (ref 70–99)
Glucose-Capillary: 111 mg/dL — ABNORMAL HIGH (ref 70–99)
Glucose-Capillary: 146 mg/dL — ABNORMAL HIGH (ref 70–99)

## 2023-04-06 LAB — CBC
HCT: 26.5 % — ABNORMAL LOW (ref 39.0–52.0)
Hemoglobin: 7.9 g/dL — ABNORMAL LOW (ref 13.0–17.0)
MCH: 27.1 pg (ref 26.0–34.0)
MCHC: 29.8 g/dL — ABNORMAL LOW (ref 30.0–36.0)
MCV: 91.1 fL (ref 80.0–100.0)
Platelets: 199 10*3/uL (ref 150–400)
RBC: 2.91 MIL/uL — ABNORMAL LOW (ref 4.22–5.81)
RDW: 19.9 % — ABNORMAL HIGH (ref 11.5–15.5)
WBC: 10.3 10*3/uL (ref 4.0–10.5)
nRBC: 0 % (ref 0.0–0.2)

## 2023-04-06 LAB — PROTIME-INR
INR: 1.8 — ABNORMAL HIGH (ref 0.8–1.2)
Prothrombin Time: 20.9 seconds — ABNORMAL HIGH (ref 11.4–15.2)

## 2023-04-06 MED ORDER — HEPARIN (PORCINE) 25000 UT/250ML-% IV SOLN
2200.0000 [IU]/h | INTRAVENOUS | Status: DC
  Administered 2023-04-06: 1600 [IU]/h via INTRAVENOUS
  Administered 2023-04-07: 1900 [IU]/h via INTRAVENOUS
  Filled 2023-04-06: qty 250

## 2023-04-06 MED ORDER — POTASSIUM CHLORIDE CRYS ER 20 MEQ PO TBCR
20.0000 meq | EXTENDED_RELEASE_TABLET | ORAL | Status: AC
  Administered 2023-04-06 (×3): 20 meq via ORAL
  Filled 2023-04-06 (×3): qty 1

## 2023-04-06 MED ORDER — WARFARIN SODIUM 3 MG PO TABS
3.0000 mg | ORAL_TABLET | Freq: Once | ORAL | Status: AC
  Administered 2023-04-06: 3 mg via ORAL
  Filled 2023-04-06: qty 1

## 2023-04-06 MED ORDER — HEPARIN BOLUS VIA INFUSION
5000.0000 [IU] | Freq: Once | INTRAVENOUS | Status: AC
  Administered 2023-04-06: 5000 [IU] via INTRAVENOUS
  Filled 2023-04-06: qty 5000

## 2023-04-06 MED ORDER — FUROSEMIDE 40 MG PO TABS
40.0000 mg | ORAL_TABLET | Freq: Every day | ORAL | Status: DC
  Administered 2023-04-06 – 2023-04-09 (×4): 40 mg via ORAL
  Filled 2023-04-06 (×4): qty 1

## 2023-04-06 NOTE — Plan of Care (Signed)
  Problem: Education: Goal: Knowledge of General Education information will improve Description: Including pain rating scale, medication(s)/side effects and non-pharmacologic comfort measures Outcome: Progressing   Problem: Health Behavior/Discharge Planning: Goal: Ability to manage health-related needs will improve Outcome: Progressing   Problem: Clinical Measurements: Goal: Diagnostic test results will improve Outcome: Progressing Goal: Respiratory complications will improve Outcome: Progressing   Problem: Activity: Goal: Risk for activity intolerance will decrease Outcome: Progressing   Problem: Nutrition: Goal: Adequate nutrition will be maintained Outcome: Progressing   Problem: Elimination: Goal: Will not experience complications related to bowel motility Outcome: Progressing Goal: Will not experience complications related to urinary retention Outcome: Progressing   Problem: Pain Managment: Goal: General experience of comfort will improve Outcome: Progressing

## 2023-04-06 NOTE — Progress Notes (Signed)
ANTICOAGULATION CONSULT NOTE - Initial Consult  Pharmacy Consult for warfarin Indication: atrial fibrillation  No Known Allergies  Patient Measurements: Weight: 114.3 kg (251 lb 15.8 oz) Heparin Dosing Weight: 100 kg 93.75 Vital Signs: Temp: 97.4 F (36.3 C) (09/26 1211) Temp Source: Oral (09/26 1211) BP: 116/76 (09/26 1105) Pulse Rate: 108 (09/26 1105)  Labs: Recent Labs    04/04/23 0401 04/05/23 0500 04/05/23 0524 04/06/23 0444  HGB 7.9*  --  8.5* 7.9*  HCT 25.6*  --  28.3* 26.5*  PLT 154  --  212 199  LABPROT  --  16.5*  --  20.9*  INR  --  1.3*  --  1.8*  CREATININE 0.69 0.70  --  0.71    Estimated Creatinine Clearance: 131 mL/min (by C-G formula based on SCr of 0.71 mg/dL).   Medical History: Past Medical History:  Diagnosis Date   Angina pectoris (HCC) Canadian classification 2 04/10/2020   Anxiety    Arthritis    Coronary artery disease coronary CT angio did not show any major issues, suspicion for distal disease 06/12/2020   Depression    DJD (degenerative joint disease)    Dyslipidemia 02/13/2020   Essential hypertension 02/13/2020   Precordial chest pain 02/13/2020    Medications:  Scheduled:    stroke: early stages of recovery book   Does not apply Once   acetaminophen  1,000 mg Oral Q6H   amiodarone  400 mg Oral BID   aspirin EC  81 mg Oral Daily   atorvastatin  80 mg Oral Daily   bisacodyl  10 mg Oral Daily   Or   bisacodyl  10 mg Rectal Daily   Chlorhexidine Gluconate Cloth  6 each Topical Daily   ezetimibe  10 mg Oral Daily   feeding supplement  237 mL Oral TID BM   furosemide  40 mg Oral Daily   insulin aspart  2-6 Units Subcutaneous Q4H   insulin detemir  8 Units Subcutaneous Daily   metoprolol tartrate  12.5 mg Oral BID   mouth rinse  15 mL Mouth Rinse 4 times per day   pantoprazole (PROTONIX) IV  40 mg Intravenous Daily   potassium chloride  20 mEq Oral Q4H   sodium chloride flush  10-40 mL Intracatheter Q12H   sodium chloride  flush  3 mL Intravenous Q12H   tamsulosin  0.4 mg Oral BID   Warfarin - Pharmacist Dosing Inpatient   Does not apply q1600   Infusions:   sodium chloride     sodium chloride Stopped (04/06/23 1048)   ampicillin (OMNIPEN) IV 2 g (04/06/23 1247)   cefTRIAXone (ROCEPHIN)  IV Stopped (04/06/23 1007)    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 Having recurrent Afib postop, pharmacy now consulted to start warfarin for Afib.   INR of 1.8 is subtherapeutic after warfarin 5 mg dose the last 2 days. Patient had 1 Ensure yesterday, unsure how much of his meals he is eating. No new DDIs identified, patient remains on amiodarone and ampicillin which may increase serum warfarin. CBC is stable.  Goal of Therapy:  INR 2-3 Monitor platelets by anticoagulation protocol: Yes   Plan:  Give warfarin 3 mg x1 Monitor daily INR Monitor for signs/symptoms of bleeding  Romie Minus, PharmD PGY1 Pharmacy Resident  Please check AMION for all [name] Pharmacy phone numbers  After 10:00 PM, call Main Pharmacy [phone] 04/06/2023,1:50 PM

## 2023-04-06 NOTE — Addendum Note (Signed)
Addended by: Laverda Page B on: 04/06/2023 04:01 PM   Modules accepted: Orders

## 2023-04-06 NOTE — Progress Notes (Signed)
LUE venous duplex has been completed.  Preliminary results given to [facility], Charity fundraiser.    Results can be found under chart review under CV PROC. 04/06/2023 4:41 PM Kanylah Muench RVT, RDMS

## 2023-04-06 NOTE — Progress Notes (Signed)
[address]       [place] [address]             [phone]      6 Days Post-Op Procedure(s) (LRB): MITRAL VALVE (MV) REPLACEMENT UTILIZING SIZE 33 MOSAIC PORCINE HEART VALVE (N/A) ASCENDING AORTIC ROOT REPLACEMENT UTILIZING 27 KONECT RESILIA  AORTIC VALVE CONDUIT (N/A) TRANSESOPHAGEAL ECHOCARDIOGRAM (N/A) Subjective:  Awake and alert, up in the chair after walking a full lap in the unit.  Tolerating the regular diet with no trouble.  On RA.  PICC placed yesterday.   Objective: Vital signs in last 24 hours: Temp:  [98.5 F (36.9 C)-98.9 F (37.2 C)] 98.5 F (36.9 C) (09/26 0400) Pulse Rate:  [85-108] 92 (09/26 0500) Cardiac Rhythm: Sinus tachycardia (09/25 1934) Resp:  [14-28] 14 (09/26 0500) BP: (78-129)/(60-99) 129/81 (09/26 0500) SpO2:  [79 %-100 %] 99 % (09/26 0500) Weight:  [114.3 kg] 114.3 kg (09/26 0500)     Intake/Output from previous day: 09/25 0701 - 09/26 0700 In: 939.3 [I.V.:171.2; IV Piggyback:768.1] Out: 2900 [Urine:2900] Intake/Output this shift: No intake/output data recorded.  General appearance: alert, cooperative, and no distress Neurologic: prefers not to use his left arm this morning but able to move normally.  Heart: RRR, monitor showing SR / ST with no arrhythmias. Lungs: normal WOB on RA Abdomen: soft, NT Extremities: left hand and forrearm swollen this morning. No tenderness. Wound: the sternotomy incision is intact and dry.  Lab Results: Recent Labs    04/05/23 0524 04/06/23 0444  WBC 11.9* 10.3  HGB 8.5* 7.9*  HCT 28.3* 26.5*  PLT 212 199   BMET:  Recent Labs    04/05/23 0500 04/06/23 0444  NA 140 140  K 3.8 3.4*  CL 102 103  CO2 27 30  GLUCOSE 94 91  BUN 11 12  CREATININE 0.70 0.71  CALCIUM 8.8* 8.6*    PT/INR:  Recent Labs    04/06/23 0444  LABPROT 20.9*  INR 1.8*   ABG    Component Value Date/Time   PHART 7.371 04/01/2023 1401   HCO3 23.1 04/01/2023 1401   TCO2 24 04/01/2023  1401   ACIDBASEDEF 2.0 04/01/2023 1401   O2SAT 98 04/01/2023 1401   CBG (last 3)  Recent Labs    04/05/23 1955 04/05/23 2300 04/06/23 0448  GLUCAP 123* 132* 89    CLINICAL DATA:  Left hip arthroplasty 5 months ago. Septic arthritis suspected in setting of disseminated enterococcal infection.   EXAM: MRI OF THE LEFT HIP WITHOUT AND WITH CONTRAST   TECHNIQUE: Multiplanar, multisequence MR imaging was performed both before and after administration of intravenous contrast.   CONTRAST:  10mL GADAVIST GADOBUTROL 1 MMOL/ML IV SOLN   COMPARISON:  Ultrasound examination 03/28/2023. Intraoperative radiographs 11/02/2022. Pelvic CT 01/26/2023.   FINDINGS: Initial images are limited by incomplete fat saturation on the T2 weighted images. Patient was returned for additional imaging consisting of coronal and axial inversion recovery images through the pelvis. The fat saturated T1 weighted images are limited by artifact from the prior left total hip arthroplasty.   Bones: Status post left total hip arthroplasty with associated susceptibility artifact. No gross hardware loosening, surrounding marrow edema or suspicious marrow enhancement identified allowing for the artifact. There is T2 hyperintensity within the left aspect of the pubis ramus which may be degenerative. No displaced fracture identified. The sacroiliac joints appear normal. Nonspecific mild T2 hyperintensity within the right iliac bone along the inferior aspect of the right  sacroiliac joint, likely incidental.   Articular cartilage and labrum   Articular cartilage: Status post left total hip arthroplasty. Mild right hip degenerative changes.   Joint or bursal effusion   Joint effusion: Allowing for the artifact associated with the prior left total hip arthroplasty, no significant hip joint effusion or abnormal synovial enhancement identified. No significant right hip joint effusion.   Bursae: Small amount of  asymmetric fluid in the left greater trochanteric bursa.   Muscles and tendons   Muscles and tendons: Asymmetric edema within the left gluteus and proximal quadriceps musculature. There is mild atrophy of the left gluteus musculature. No focal intramuscular fluid collections are identified. The gluteus, common hamstring and iliopsoas tendons appear intact.   Other findings   Miscellaneous: Generalized subcutaneous edema laterally in pelvis and proximal thighs bilaterally without focal fluid collection. A moderate amount of free pelvic fluid has developed since the previous pelvic CT.   IMPRESSION: 1. Status post left total hip arthroplasty. No gross hardware loosening, surrounding marrow edema or suspicious marrow enhancement identified allowing for the associated hardware artifact. No specific evidence of septic arthritis. 2. Nonspecific T2 hyperintensity within the left aspect of the symphysis pubis. 3. Asymmetric edema within the left gluteus and proximal quadriceps musculature which could be infectious or inflammatory in etiology. No focal intramuscular fluid collections identified. 4. Nonspecific subcutaneous edema laterally in the pelvis and proximal thighs bilaterally without focal fluid collection. 5. Moderate amount of free pelvic fluid has developed since the previous pelvic CT.     Electronically Signed   By: Carey Bullocks M.D.   On: 04/05/2023 17:31  Assessment/Plan: S/P Procedure(s) (LRB): MITRAL VALVE (MV) REPLACEMENT UTILIZING SIZE 33 MOSAIC PORCINE HEART VALVE (N/A) ASCENDING AORTIC ROOT REPLACEMENT UTILIZING 27 KONECT RESILIA  AORTIC VALVE CONDUIT (N/A) TRANSESOPHAGEAL ECHOCARDIOGRAM (N/A)  -POD 6 MVR and Bio-[place]entall for endocarditis. Progressing well. On ASA, low-dose metoprolol, Coumadin. INR 1.8. Coumadin dosing per pharmacy.  Plan transfer to 4E today.    -Left arm swelling- new finding. No tenderness but prefers not to use it. Will check a venous  duplex scan today to r/o DVT.  He is being anticoagulated.   -ID- Enterococcus bacteremia on admission. PICC placed yesterday in RUE. On amp / ceftriaxone for 6 weeks from 6./22 per ID. MRI left hip prosthesis yesterday with no obvious indication of septic arthritis.   -PULM- on RA with good sats. CXR yesterday with no unexpected changes, mild basilar ATX. Good effort with IS. Ambulate.   -Post op a-fib- back in SR and or oral amiodarone. Correct K+.   -GI- tolerating regular diet, BM a few days ago.   -CVA- pre-op,  suspected embolic from valvular vegetation. SLP/PT/OT on board, considering CIR.   -Disposition- Would benefit from inpatient rehab if this can be arranged. Rehab coordinator has discussed with patient and family and is following.    LOS: 9 days    Leary Roca, [place] [phone] 04/06/2023

## 2023-04-06 NOTE — Progress Notes (Signed)
Inpatient Rehab Admissions Coordinator:   Spoke with pt's daughters on the phone regarding caregiver support and discharge options.  We reviewed several different options extensively.  They are not in favor of SNF at this time, and in any case, it would be difficult to place him given no payor source.  We reviewed that we could not discharge from CIR to SNF.  We discussed possibility of admission to CIR with planned transition to ALF, either directly from CIR, or discharging home with Amber for a few weeks until bed at ALF becomes available.  I encouraged them to begin calling places to initiate that process as it can sometimes take time.  I will not have a bed available for this patient to admit to CIR today, but will touch base with medical team to see if they feel he would be ready in the next 1-2 days.   Estill Dooms, PT, DPT Admissions Coordinator [phone] 04/06/23  12:42 PM

## 2023-04-06 NOTE — Progress Notes (Signed)
Nutrition Follow-up  DOCUMENTATION CODES:   Severe malnutrition in context of acute illness/injury  INTERVENTION:   Continue Regular Diet  Continue Ensure Enlive po TID, each supplement provides 350 kcal and 20 grams of protein. LOVES CHOCOLATE   NUTRITION DIAGNOSIS:   Severe Malnutrition related to acute illness (possible chronic component as well) as evidenced by moderate fat depletion, moderate muscle depletion.  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 PMH includes CAD, anxiety, arthritis, dyslipidemia, HTN  9/17 Admitted 9/18 Trickle TF initiated with PS x 4 9/20 OR for MVR and aortic root replacement; advancing TF towards goal, added free water flushes 9/21 Extubated 9/22 Diet advanced to [facility] 9/24 Diet advanced to Regular  Pt up and walking with PT this AM  PO intake and appetite improving. Ate good breakfast this AM. RN yesterday reported around 50-100% of meals. No recorded po however since diet advanced to Solids  Pt also drinking Ensure shakes, loves the chocolate and drank 100% this AM  Labs: CBGs 89-145, potassium 3.4, Creatinine/BUN wdl Meds: lasix, ss novolog, levemir, dulcolax, KCl  Diet Order:   Diet Order             Diet regular Room service appropriate? Yes with Assist; Fluid consistency: Thin  Diet effective now                   EDUCATION NEEDS:   Education needs have been addressed (discussed nutrition plan with family)  Skin:  Skin Assessment: Reviewed RN  Assessment  Last BM:  9/23  Height:   Ht Readings from Last 1 Encounters:  03/17/23 5\' 11"  (1.803 m)    Weight:   Wt Readings from Last 1 Encounters:  04/06/23 114.3 kg     BMI:  Body mass index is 35.14 kg/m.  Estimated Nutritional Needs:   Kcal:  2100-2300 kcals  Protein:  120-150 g  Fluid:  >/= 2L    Romelle Starcher MS, RDN, LDN, CNSC Registered Dietitian 3 Clinical Nutrition RD Pager and On-Call Pager Number Located in [place]

## 2023-04-06 NOTE — Progress Notes (Signed)
Physical Therapy Treatment Patient Details Name: Dale Mills MRN: [id number] DOB: May 08, 1966 Today's Date: 04/06/2023   History of Present Illness Pt is a 57 y/o M presenting to [facility] with fever, nausea, chills, hypotensive, CXR with flash pulmonary edema, intubated. MRI head obtained due to persistent encephalopathy and found to have R PCA territory occipital lobe CVA with small acute infarcts in bilateral parietal lobes/cerebellum, small acute SA hemorrhage.Transferred to [facility] for severe mitral valve endocarditis s/p MVR on 9/20.  PMH includes HTN, CAD, recurrent UTI, pyelonephritis, L THA    PT Comments  Progressing toward goals steadily, encephalopathy remains, but notable improvements, pt acknowledging problems.  Emphasis on transition, transfers following precautions and progression of gait with the RW working on improving heel/toe-- normalizing gait.     If plan is discharge home, recommend the following: A little help with walking and/or transfers;A lot of help with bathing/dressing/bathroom;Assistance with cooking/housework;Direct supervision/assist for medications management;Direct supervision/assist for financial management;Assist for transportation   Can travel by private vehicle        Equipment Recommendations  Other (comment) (TBD)    Recommendations for Other Services Rehab consult     Precautions / Restrictions Precautions Precautions: Sternal;Fall Precaution Comments: pt able to recall precautions however requires constant cues to adhere to them functionally Restrictions Weight Bearing Restrictions: Yes     Mobility  Bed Mobility Overal bed mobility: Needs Assistance Bed Mobility: Sit to Supine   Sidelying to sit: Min assist     Sit to sidelying: Mod assist      Transfers Overall transfer level: Needs assistance   Transfers: Sit to/from Stand Sit to Stand: Mod assist           General transfer comment: light mod assist for coming  forward more than boost, use of heart pillow to reinforce no use of UE's    Ambulation/Gait Ambulation/Gait assistance: Min assist, Mod assist Gait Distance (Feet): 160 Feet Assistive device: Rolling walker (2 wheels) Gait Pattern/deviations: Step-through pattern, Decreased stride length, Narrow base of support, Drifts right/left, Scissoring   Gait velocity interpretation: <1.31 ft/sec, indicative of household ambulator   General Gait Details: mild unsteadiness overall with some degradation as his involved side fatigued.  Mod assist generally with short episodes of min assist.   Stairs             Wheelchair Mobility     Tilt Bed    Modified Rankin (Stroke Patients Only) Modified Rankin (Stroke Patients Only) Pre-Morbid Rankin Score: No symptoms Modified Rankin: Moderately severe disability     Balance   Sitting-balance support: Feet supported Sitting balance-Leahy Scale: Fair     Standing balance support: Reliant on assistive device for balance Standing balance-Leahy Scale: Poor Standing balance comment: dependent on external support                            Cognition Arousal: Alert Behavior During Therapy: WFL for tasks assessed/performed Overall Cognitive Status: Impaired/Different from baseline Area of Impairment: Attention, Memory, Following commands, Safety/judgement, Awareness, Problem solving, Orientation                   Current Attention Level: Sustained, Selective Memory: Decreased short-term memory, Decreased recall of precautions Following Commands: Follows one step commands with increased time Safety/Judgement: Decreased awareness of deficits Awareness: Intellectual Problem Solving: Slow processing, Requires verbal cues, Difficulty sequencing, Decreased initiation General Comments: requires increased time to process, but incremental improvement.  Pt feeling that  it is all need to wake up.        Exercises       General Comments General comments (skin integrity, edema, etc.): VSS on RA when PLETH reads appropriately      Pertinent Vitals/Pain Pain Assessment Pain Assessment: Faces Faces Pain Scale: No hurt Pain Intervention(s): Monitored during session    Home Living                          Prior Function            PT Goals (current goals can now be found in the care plan section) Acute Rehab PT Goals Patient Stated Goal: get better PT Goal Formulation: With patient Time For Goal Achievement: 04/17/23 Potential to Achieve Goals: Good Progress towards PT goals: Progressing toward goals    Frequency    Min 1X/week      PT Plan      Co-evaluation              AM-PAC PT "6 Clicks" Mobility   Outcome Measure  Help needed turning from your back to your side while in a flat bed without using bedrails?: A Little Help needed moving from lying on your back to sitting on the side of a flat bed without using bedrails?: A Lot Help needed moving to and from a bed to a chair (including a wheelchair)?: A Lot Help needed standing up from a chair using your arms (e.g., wheelchair or bedside chair)?: A Lot Help needed to walk in hospital room?: A Lot Help needed climbing 3-5 steps with a railing? : Total 6 Click Score: 12    End of Session   Activity Tolerance: Patient tolerated treatment well;Patient limited by fatigue Patient left: with call bell/phone within reach;with nursing/sitter in room;in bed Nurse Communication: Mobility status PT Visit Diagnosis: Other abnormalities of gait and mobility (R26.89);Muscle weakness (generalized) (M62.81);Other symptoms and signs involving the nervous system (R29.898)     Time: 5409-8119 PT Time Calculation (min) (ACUTE ONLY): 35 min  Charges:    $Gait Training: 8-22 mins $Therapeutic Activity: 8-22 mins PT General Charges $$ ACUTE PT VISIT: 1 Visit                     04/06/2023  Jacinto Halim., PT Acute Rehabilitation  Services [phone]  (office)   Dale Mills 04/06/2023, 12:52 PM

## 2023-04-06 NOTE — Progress Notes (Signed)
NAME:  Dale Mills, MRN:  [id number], DOB:  1965-12-29, LOS: 9 ADMISSION DATE:  03/28/2023, CONSULTATION DATE:  9/17 REFERRING MD:  August Saucer, CHIEF COMPLAINT:  endocarditis    History of Present Illness:  57 year old male patient with history as mentioned below which includes hypertension, coronary artery disease, recurrent urinary tract infections and pyelonephritis, and recent left hip arthroplasty with surgery completed back in April 2024.  Presents to the emergency room with chief complaint of nausea, lightheadedness, generalized weakness, fever and chills.  Presented from work on 9/16 with the above symptoms, initially found to be slightly hypotensive with systolic blood pressure 95 heart rate 92 white blood cell count of 20.4 hemoglobin 10.9 BUN was 39 and serum creatinine 2.6, baseline 0.6.  His BNP was 2880, UA was concerning for possible UTI chest x-ray was clear CT and abdomen was negative for acute findings a right upper quadrant ultrasound was obtained which showed cholelithiasis but no evidence of cholecystitis his heart rate continue to elevate while in the emergency room noted to be in sinus tachycardia heart rate 110s to 120s blood pressure remained in the soft 90s systolic cultures were sent he was administered IV crystalloid and 1 during resuscitation effort required increasing supplemental oxygen with progressive increased work of breathing and follow-up chest x-ray suggesting flash pulmonary edema.  He was unable to tolerate noninvasive ventilation and therefore was intubated.  His Tmax while in the emergency room was 105.8 his blood pressure at its lowest dropped to the 80s systolic.  He was started on dobutamine infusion and broad-spectrum antibiotics have been started as well working diagnosis at time of initial evaluation was sepsis/septic shock felt secondary to urinary tract source complicated by AKI, non-STEMI, and flash pulmonary edema.  He was admitted to the intensive care,  antibiotics were continued as were resuscitation efforts and biotics.  Blood cultures were positive for GPC in pairs and chains with PCR detecting e faecalis.  Transthoracic echocardiogram suggesting endocarditis because of these findings he was transferred to [facility] for definitive care  Pertinent  Medical History  Recent Left hip replacement, pyelonephritis, hypertension, depression, anxiety, coronary artery disease, prior cardiac catheterization, prior hernia repair.  Last echocardiogram in July 2024 showed EF 55 to 60%, recurrent UTIs.  Discussed this  Significant Hospital Events: Including procedures, antibiotic start and stop dates in addition to other pertinent events    9/16 admitted with septic shock initially felt urinary tract source, abx started 9/16 through 9/17 cultures positive for GPC in pairs and clusters, PCR suggesting E facialis, echocardiogram suggesting mitral valve endocarditis, transferred to [facility] TEE obtained at bedside and ccm admitting  9/20 PROCEDURES:   MITRAL VALVE (MV) REPLACEMENT  ASCENDING AORTIC ROOT REPLACEMENT UTILIZING 27 KONECT RESILIA AORTIC VALVE CONDUIT (N/A) TRANSESOPHAGEAL ECHOCARDIOGRAM (N/A)  Interim History / Subjective:  No events. Having some LUE weakness. Good diuresis, remains in good spirits.  Objective   Blood pressure 129/81, pulse 92, temperature 98.9 F (37.2 C), temperature source Oral, resp. rate 14, weight 114.3 kg, SpO2 99%.        Intake/Output Summary (Last 24 hours) at 04/06/2023 0957 Last data filed at 04/06/2023 0500 Gross per 24 hour  Intake 889.32 ml  Output 2500 ml  Net -1610.68 ml   Filed Weights   04/04/23 0500 04/05/23 0500 04/06/23 0500  Weight: 115.6 kg 116.3 kg 114.3 kg    Examination: No distress Some mild ongoing anasarca LUE weakness Moves to command Aox3 Lungs clear Sternotomy looks good  BMP  stable, repleting K H/H down slightly  Resolved Hospital Problem list     Assessment & Plan:    Septic shock secondary to  E Facialis bacteremia and AV/MV endocarditis, baseline aortic root dilation- s/p MVR + aortic root replacement 03/31/23 Acute hypoxic resp failure 2/2 pulmonary edema and possible element of ALI Acute renal failure secondary to septic shock Acute metabolic encephalopathy 2/2 sepsis and multifocal CVA 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; bilateral small ceribellar/parietal, R PCA larger infarct; some lingering LUE weakness Chronic anemia  Postop afib- amio 04/03/23 H/o CAD w/ elevated trop I 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  Myrla Halsted MD PCCM [place] [facility] 04/06/2023 9:57 AM

## 2023-04-06 NOTE — Progress Notes (Signed)
error 

## 2023-04-06 NOTE — Progress Notes (Addendum)
PHARMACY CONSULT NOTE FOR:  OUTPATIENT  PARENTERAL ANTIBIOTIC THERAPY (OPAT)  Indication:  Enterococcus faecalis endocarditis  Regimen: Ampicillin IV 12g Q24H as a continuous infusion, Ceftriaxone IV 2g Q12H End date: 05/12/2023  IV antibiotic discharge orders are pended. To discharging provider:  please sign these orders via discharge navigator,  Select New Orders & click on the button choice - Manage This Unsigned Work.     Thank you for allowing pharmacy to be a part of this patient's care.  [place]  Utomwen 04/06/2023, 9:01 AM   Sharin Mons, PharmD, BCPS, BCIDP Infectious Diseases Clinical Pharmacist Phone: [phone] 04/06/2023 1:11 PM

## 2023-04-06 NOTE — Discharge Summary (Addendum)
Physician Discharge Summary  Patient ID: DENNARD VEZINA MRN: [id number] DOB/AGE: 10-22-1965 57 y.o.  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  He presented to [facility] with complaints nausea, weakness, fever, and chills.  Workup in the ED showed the patient to be hypotensive, + leukocytosis, AKI with creatinine level of 2.6, and tachycardia.  He was started on fluids, which unfortunately let to flash pulmonary edema that required intubation.  He was started on broad spectrum ABX, blood cultures were obtained which showed Enterococcus Faecalis.  TTE was obtained and was concerning for a Mitral Valve Vegetation.  He was transferred to [facility] for further care.  Urgent Cardiology consult was obtained due to loud murmurs on exam.  Stat Echocardiogram was performed and showed normal biventricular function, there was severe AI prompting concern for Aortic Dissection  and ICU TEE was performed at bedside.  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  Dissection was able to be excluded with TEE.  He remains critically ill, intubated, on broad spectrum antibiotics, and Levophed for hypotensive septic shock.  Cardiothoracic surgery consultation has been requested.  The patient is sedated on vent.  His daughters are at bedside.  They states he hasn't been himself since his hip surgery.  They states he just kept saying I was fine until I had this done.  He has been having episodic N/V 2-3 times per day.  They state his primary physician felt it was related to anemia and he was scheduled to have IV iron infusion this coming Friday.  He has his own teeth and was most recently seen at the dentist prior to his hip replacement.  Mr. Och was evaluated by Dr. Leafy Ro and felt to be a candidate for urgent mitral valve replacement and aortic root replacement.  Hospital Course: Mr. Uvaldo Rising was taken to the operating room on 03/31/2023 where aortic valve replacement was carried out utilizing a right prosthetic valve.  The anterior leaflet was frayed and had a large vegetation.  It was excised and the posterior leaflet was left intact.  Aortic root replacement was carried out with a 27 mm Konect pericardial valve conduit.  Following the procedure, he separated from cardiopulmonary bypass without difficulty on vasopressin and norepinephrine.  He was transported to the surgical ICU in stable condition.  He received 2 units of fresh frozen plasma and 1 unit of packed red blood cells intraoperatively.  Ceftriaxone and ampicillin were continued  postoperatively. OR culture showed gram positive cocci in pairs. Per neurology, pre op patient had an acute ischemic infarct:  embolic shower with largest at right PCA infarct  and small right frontal SAH, etiology: Cardioembolic in the setting of endocarditis. MRI showed confluent right PCA territory infarct, additional similar acute infarcts in bilateral parietal lobes in the cerebellum, similar small volume acute subarachnoid hemorrhage along right frontal convexity. Patient was able to move all extremities post op. He was extubated the afternoon of post op day one. He was maintaining sinus rhythm. He was volume overloaded and diuresed. He was started on Lovenox for DVT prophylaxis. A clear liquid diet was started and he tolerated this well with no evidence of aspiration.  The diet was then advanced to regular which he tolerated with no trouble. Activity was also advanced and he was able to walk a full lap in the ICU by the 4th post-op day.  A PICC line was placed on post-op day 5 for long-term IV antibiotics per ID recommendations. An MRI of the left hip prosthesis (placed in April 2024) was requested per ID recommendation to rule out this as a source of the bacterial endocarditis. The study showed no evidence of septic arthritis in the left hip.  On post-op day 7 he was noted to have swelling in his left arm and hand. A venous duplex scan was obtained demonstrating a left internal jugular DVT and left superficial cephalic vein thrombosis. Anticoagulation with Coumadin was continued with Coumadin with the INR at 1.9 at discharge.  In addition, he will be continued on subcutaneous enoxaparin (managed by pharmacy)  until the INR is stable at 2.0 or greater.  He was felt to benefit from additional therapy in the inpatient rehab unit and was accepted for transfer to that facility on 04/07/23.  The plan was discussed with Mr. Mcmains and his family and all were in agreement. He is medically stable for discharge to CIR today.   Consults: cardiology, pulmonary/intensive care, and ID  Significant Diagnostic Studies:   CLINICAL DATA:  Postoperative status.   EXAM: PORTABLE CHEST 1  VIEW   COMPARISON:  April 03, 2023.   FINDINGS: Stable cardiomegaly. Status post aortic valve repair. Right internal jugular catheter is unchanged. Bibasilar atelectasis is noted with possible small left pleural effusion. Left-sided chest tube has been removed without pneumothorax.   IMPRESSION: Bibasilar atelectasis with small left pleural effusion.     Electronically Signed   By: Lupita Raider M.D.   On: 04/05/2023 10:40    CLINICAL DATA:  Mental status change.  Infective endocarditis.   EXAM: CT HEAD WITHOUT CONTRAST   TECHNIQUE: Contiguous axial images were obtained from the base of the skull through the vertex without intravenous contrast.   RADIATION DOSE REDUCTION: This exam was performed according to the departmental dose-optimization program which includes automated exposure control, adjustment of the mA and/or kV according to patient size and/or use of iterative reconstruction technique.   COMPARISON:  None available   FINDINGS: Brain: A subacute medial right occipital lobe nonhemorrhagic infarct is present. No other acute cortical infarct is present. Focal extra-axial hyperdensity is present over the posterior right frontal convexity. Subarachnoid blood is present subjacent to this area. No other focal extracranial hemorrhage is present. No acute parenchymal hemorrhage is present. No significant white matter lesions are present. Deep brain nuclei are within normal limits. The ventricles are of normal size.   The brainstem and cerebellum are within normal limits. midline structures are within  normal limits.   Vascular: No hyperdense vessel or unexpected calcification.   Skull: Calvarium is intact. No focal lytic or blastic lesions are present. No significant extracranial soft tissue lesion is present.   Sinuses/Orbits: Right greater than left mastoid effusions are present. Endotracheal tube and orogastric tube are in place. Paranasal sinuses are  otherwise clear.   IMPRESSION: 1. Subacute medial right occipital lobe nonhemorrhagic infarct. 2. Focal extra-axial hyperdensity over the posterior right frontal convexity likely represents a small subdural hematoma. 3. Subarachnoid blood subjacent to this area. 4. Right greater than left mastoid effusions. 5. Endotracheal tube and orogastric tube in place.   In the setting of infective endocarditis, the infarct and possibly hemorrhage could be related to septic emboli. MRI of the brain without and with contrast may be useful for further evaluation.   These results were called by telephone at the time of interpretation on 03/29/2023 at 4:55 pm to provider Audie Box , who verbally acknowledged these results.     Electronically Signed   By: Marin Roberts M.D.   On: 03/29/2023 16:55     TRANSESOPHOGEAL ECHO REPORT       Patient Name:   CLIFTON SAFLEY Isenhower Date of Exam: 03/28/2023  Medical Rec #:  [id number]      Height:       71.0 in  Accession #:    [id number]     Weight:       249.2 lb  Date of Birth:  [birth year]      BSA:          2.315 m  Patient Age:    57 years       BP:           94/54 mmHg  Patient Gender: M              HR:           92 bpm.  Exam Location:  Inpatient   Procedure: 3D Echo, Transesophageal Echo, Color Doppler and Cardiac  Doppler   Indications:    Endocarditis.    History:         Patient has prior history of Echocardiogram examinations,  most                  recent 01/28/2023. Risk Factors:Hypertension and  Dyslipidemia.    Sonographer:    Delcie Roch RDCS  Referring Phys:  [id number] Parke Poisson  Diagnosing Phys: Weston Brass MD     Sonographer Comments: MD comments: TEE performed emergently to rule out  aortic dissection with recognition of severe AI.    PROCEDURE: After discussion of the risks and benefits of a TEE, an  informed consent was obtained emergent. The patient was intubated. TEE  procedure time was 40  minutes. The transesophogeal probe was passed  without difficulty through the esophogus of the  patient. Imaged were obtained with the patient in a supine position.  Sedation performed by different physician. Image quality was excellent.  The patient's vital signs; including heart rate, blood pressure, and  oxygen saturation; remained stable  throughout the procedure. Supplementary images were obtained from  transthoracic windows as indicated to answer the clinical question. The  patient developed no complications during the procedure.    IMPRESSIONS     1. 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 and  anterior leaflet windsock aneurysm with torrential mitral valve  regurgitation. Vegetation is at least 2.5 x 1.2 cm. The mitral valve is  abnormal.  Torrential mitral valve regurgitation.   2. 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 Severe aortic valve regurgitation with holosystolic reversals  in the descending thoracic aorta, reversal TVI   16 cm. Small aortic valve vegetation seen below AV along aortomitral  continuity but not contiguous with mitral valve vegetation. No evidence of  aortic root abscess . The aortic valve is abnormal. Aortic valve  regurgitation is severe. Aortic  regurgitation PHT measures 198 msec.   3. No evidence of aortic dissection. Aortic dilatation noted. 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 There is severe dilatation of the aortic  root.   4. Left ventricular ejection fraction, by estimation, is 65 to 70%. The  left ventricle has normal function.   5. Right ventricular systolic function is normal. The right ventricular  size is normal.   6. Left atrial size was mildly dilated. No left atrial/left atrial  appendage thrombus was detected.   7. A small pericardial effusion is present.   FINDINGS   Left Ventricle: Left  ventricular ejection fraction, by estimation, is 65  to 70%. The left ventricle has normal function. The left ventricular  internal cavity size was normal in size.   Right Ventricle: The right ventricular size is normal. No increase in  right ventricular wall thickness. Right ventricular systolic function is  normal.   Left Atrium: Left atrial size was mildly dilated. No left atrial/left  atrial appendage thrombus was detected.   Right Atrium: Right atrial size was normal in size.   Pericardium: A small pericardial effusion is present.   Mitral Valve: 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  and anterior leaflet windsock aneurysm with torrential mitral valve  regurgitation. Vegetation is at least 2.5 x 1.2 cm. The mitral valve is  abnormal. Torrential mitral valve  regurgitation.   Tricuspid Valve: The tricuspid valve is grossly normal. Tricuspid valve  regurgitation is trivial.   Aortic Valve: Tricuspid aortic valve with poor central coaptation with  mixed mechaism (aortic valve endocarditis and dilation due to aortic root  aneurysm). Severe aortic valve regurgitation with holosystolic reversals  in the descending thoracic aorta,  reversal TVI 16 cm. Small aortic valve vegetation seen below AV along  aortomitral continuity but not contiguous with mitral valve vegetation. No  evidence of aortic root abscess. The aortic valve is abnormal. Aortic  valve regurgitation is severe. Aortic  regurgitation PHT measures 198 msec.   Pulmonic Valve: The pulmonic valve was grossly normal. Pulmonic valve  regurgitation is not visualized.   Aorta: No evidence of aortic dissection. Aortic dilatation noted. There is  severe dilatation of the aortic root. There is an aneurysm involving the  aortic root. There is minimal (Grade I) plaque involving the aortic arch  and descending aorta.   Venous: A pattern of systolic flow reversal, suggestive of severe mitral  regurgitation  is recorded from the left upper pulmonary vein and the right  upper pulmonary vein.   IAS/Shunts: No atrial level shunt detected by color flow Doppler.   Additional Comments: Spectral Doppler performed.   AORTIC VALVE  AI PHT:      198 msec    AORTA  Ao Root diam: 5.30 cm  Ao Asc diam:  3.50 cm   TRICUSPID VALVE  TR Peak grad:   26.0 mmHg  TR Vmax:        255.00 cm/s   Weston Brass MD  Electronically signed by Weston Brass MD  Signature Date/Time: 03/28/2023/6:32:1      CLINICAL DATA:  Left hip arthroplasty 5 months ago. Septic arthritis suspected in setting of disseminated enterococcal infection.   EXAM: MRI OF THE LEFT HIP WITHOUT AND WITH CONTRAST   TECHNIQUE: Multiplanar, multisequence MR imaging was performed both before and after administration of intravenous contrast.   CONTRAST:  10mL GADAVIST GADOBUTROL 1 MMOL/ML IV SOLN   COMPARISON:  Ultrasound examination 03/28/2023. Intraoperative radiographs 11/02/2022. Pelvic CT 01/26/2023.   FINDINGS: Initial images are limited by incomplete fat saturation on the T2 weighted images. Patient was returned for additional imaging consisting of coronal and axial inversion recovery images through the pelvis. The fat saturated T1 weighted images are limited by artifact from the prior left total hip arthroplasty.   Bones: Status post left total hip arthroplasty with associated susceptibility artifact. No gross hardware loosening, surrounding marrow edema or suspicious marrow enhancement identified allowing for the artifact. There is T2 hyperintensity within the left aspect of the pubis ramus which may be degenerative. No displaced fracture identified. The sacroiliac joints appear normal. Nonspecific mild T2 hyperintensity within the right iliac bone along the inferior aspect of the right sacroiliac joint, likely incidental.   Articular cartilage and labrum   Articular cartilage: Status post left total hip  arthroplasty. Mild right hip degenerative changes.   Joint or bursal effusion   Joint effusion: Allowing for the artifact associated with the prior left total hip arthroplasty, no significant hip joint effusion or abnormal synovial enhancement identified. No significant right hip joint effusion.   Bursae: Small amount of asymmetric fluid in the left greater trochanteric bursa.   Muscles and tendons   Muscles and tendons: Asymmetric edema within the left gluteus and proximal quadriceps musculature. There is mild atrophy of the left gluteus musculature. No focal intramuscular fluid collections are identified. The gluteus, common hamstring and iliopsoas tendons appear intact.   Other findings   Miscellaneous: Generalized subcutaneous edema laterally in pelvis and proximal thighs bilaterally without focal fluid collection. A moderate amount of free pelvic fluid has developed since the previous pelvic CT.   IMPRESSION: 1. Status post left total hip arthroplasty. No gross hardware loosening, surrounding marrow edema or suspicious marrow enhancement identified allowing for the associated hardware artifact. No specific evidence of septic arthritis. 2. Nonspecific T2 hyperintensity within the left aspect of the symphysis pubis. 3. Asymmetric edema within the left gluteus and proximal quadriceps musculature which could be infectious or inflammatory in etiology. No focal intramuscular fluid collections identified. 4. Nonspecific subcutaneous edema laterally in the pelvis and proximal thighs bilaterally without focal fluid collection. 5. Moderate amount of free pelvic fluid has developed since the previous pelvic CT.     Electronically Signed   By: Carey Bullocks M.D.   On: 04/05/2023 17:31           Treatments:   CARDIOVASCULAR SURGERY OPERATIVE NOTE   03/31/2023 RAESHAUN SIMSON [id number]   Surgeon:  Ashley Akin, MD   First Assistant: Jillyn Hidden [facility]                 Preoperative Diagnosis: Endocarditis of the mitral and aortic valve with severe regurgitation of both valves and large vegetation on mitral valve    Postoperative Diagnosis:  Same   Procedure: Mitral Valve replacement with a 31 mm Mosaic Porcine valve  Aortic Root replacement  (Bental) with a 27mm Connect Pericardial valve conduit   Anesthesia:  General Endotracheal  Clinical History/Surgical Indication:   Patient critically ill with Hypotensive Septic Shock, Aortic Valve Endocarditis with severe AI and Aortic Root Enlargement, and Mitral Valve Endocarditis with severe MR.    Findings: Ventricular function appeared preserved on preoperative TEE.  There was severe mitral and aortic valve regurgitation with large vegetation on the anterior leaflet of the mitral valve which was very mobile.  Conclusion the procedure the patient was in normal sinus rhythm with a well-functioning mitral valve apparatus along with the aortic valve.  Patient had preserved ventricular function.  Discharge Exam: Blood pressure 122/78, pulse 100, temperature 99.7 F (37.6 C), temperature source Oral, resp. rate 20, weight 110.6 kg, SpO2 95%.  General appearance: alert, cooperative, and no distress Neurologic: no change in exam Heart: RRR, monitor showing SR / ST with no arrhythmias. Lungs: normal WOB on RA Abdomen: soft, NT Extremities: left hand and forearm swollen (unchanged) No tenderness. Wound: the sternotomy incision is intact and dry.  Disposition: Discharge disposition: [facility] Not Defined      Discharged to CIR in stable condition.  Discharge Instructions     [facility] pharmacist to adjust dose for Vancomycin, Aminoglycosides and other anti-infective therapies as requested by physician.   Complete by: As directed    [facility] to provide Cath Flo 2mg    Complete by: As directed    Administer for PICC line occlusion and as  ordered by physician for other access device issues.   Amb Referral to Cardiac Rehabilitation   Complete by: As directed    Diagnosis: Valve Replacement   Valve: Mitral   After initial evaluation and assessments completed: Virtual Based Care may be provided alone or in conjunction with Phase 2 Cardiac Rehab based on patient barriers.: Yes   Intensive Cardiac Rehabilitation (ICR) [facility] location only OR Traditional Cardiac Rehabilitation (TCR) *If criteria for ICR are not met will enroll in TCR [name] only): Yes   Anaphylaxis Kit: Provided to treat any anaphylactic reaction to the medication being provided to the patient if First Dose or when requested by physician   Complete by: As directed    Epinephrine 1mg /ml vial / amp: Administer 0.3mg  (0.63ml) subcutaneously once for moderate to severe anaphylaxis, nurse to call physician and pharmacy when reaction occurs and call 911 if needed for immediate care   Diphenhydramine 50mg /ml IV vial: Administer 25-50mg  IV/IM PRN for first dose reaction, rash, itching, mild reaction, nurse to call physician and pharmacy when reaction occurs   Sodium Chloride 0.9% NS IV: Administer if needed for hypovolemic blood pressure drop or as ordered by physician after call to physician with anaphylactic reaction   Change dressing on IV access line weekly and PRN   Complete by: As directed    Flush IV access with Sodium Chloride 0.9% and Heparin 10 units/ml or 100 units/ml   Complete by: As directed    Home infusion instructions - [facility]   Complete by: As directed    Instructions: Flush IV access with Sodium Chloride 0.9% and Heparin 10units/ml or 100units/ml   Change dressing on IV access line: Weekly and PRN   Instructions Cath Flo 2mg : Administer for PICC Line occlusion and as ordered by physician for other access device   [facility] pharmacist to adjust dose for: Vancomycin, Aminoglycosides and other anti-infective therapies as requested  by physician   Method of administration may be changed at the discretion of home infusion pharmacist based upon assessment of the patient and/or caregiver's  ability to self-administer the medication ordered   Complete by: As directed       Allergies as of 04/07/2023   No Known Allergies      Medication List     STOP taking these medications    atenolol 25 MG tablet Commonly known as: TENORMIN   celecoxib 200 MG capsule Commonly known as: CELEBREX   diclofenac 75 MG EC tablet Commonly known as: VOLTAREN   ondansetron 4 MG disintegrating tablet Commonly known as: ZOFRAN-ODT   ranolazine 1000 MG SR tablet Commonly known as: RANEXA       TAKE these medications    amiodarone 400 MG tablet Commonly known as: PACERONE Take 1 tablet (400 mg total) by mouth 2 (two) times daily. Through 04/14/2023 then decrease the dose to 200mg  by mouth twice daily.   ampicillin IVPB Inject 12 g into the vein daily. As a continuous infusion Indication:  Enterococcus faecalis endocarditis  First Dose: Yes Last Day of Therapy: 05/12/2023 Labs - Once weekly:  CBC/D and BMP, Labs - Once weekly: ESR and CRP Method of administration: Ambulatory Pump (Continuous Infusion) Method of administration may be changed at the discretion of home infusion pharmacist based upon assessment of the patient and/or caregiver's ability to self-administer the medication ordered.   aspirin EC 81 MG tablet Take 1 tablet (81 mg total) by mouth daily. Swallow whole. Start taking on: April 08, 2023 What changed:  medication strength how much to take additional instructions   atorvastatin 80 MG tablet Commonly known as: LIPITOR Take 1 tablet (80 mg total) by mouth daily.   cefTRIAXone IVPB Commonly known as: ROCEPHIN Inject 2 g into the vein every 12 (twelve) hours. Indication:   Enterococcus faecalis endocarditis  First Dose: Yes Last Day of Therapy:  05/12/2023 Labs - Once weekly:  CBC/D and BMP, Labs -  Once weekly: ESR and CRP Method of administration: IV Push Method of administration may be changed at the discretion of home infusion pharmacist based upon assessment of the patient and/or caregiver's ability to self-administer the medication ordered.   enoxaparin 120 MG/0.8ML injection Commonly known as: LOVENOX Inject 0.74 mLs (110 mg total) into the skin every 12 (twelve) hours. Start taking on: April 08, 2023   ezetimibe 10 MG tablet Commonly known as: ZETIA Take 1 tablet (10 mg total) by mouth daily. Start taking on: April 08, 2023   Fe Fum-Vit C-Vit B12-FA Caps capsule Commonly known as: TRIGELS-F FORTE Take 1 capsule by mouth daily after breakfast. Start taking on: April 08, 2023   feeding supplement Liqd Take 237 mLs by mouth 3 (three) times daily between meals.   furosemide 40 MG tablet Commonly known as: LASIX Take 1 tablet (40 mg total) by mouth daily. Start taking on: April 08, 2023   metoprolol tartrate 25 MG tablet Commonly known as: LOPRESSOR Take 0.5 tablets (12.5 mg total) by mouth 2 (two) times daily.   mouth rinse Liqd solution 15 mLs by Mouth Rinse route 2 (two) times daily.   nitroGLYCERIN 0.4 MG SL tablet Commonly known as: NITROSTAT Place 1 tablet (0.4 mg total) under the tongue every 5 (five) minutes as needed for chest pain.   pantoprazole 40 MG tablet Commonly known as: PROTONIX Take 40 mg by mouth daily.   sodium chloride flush 0.9 % Soln Commonly known as: NS 10-40 mLs by Intracatheter route every 12 (twelve) hours.   sodium chloride flush 0.9 % Soln Commonly known as: NS 10-40 mLs by Intracatheter route as needed (  flush).   tamsulosin 0.4 MG Caps capsule Commonly known as: FLOMAX Take 0.4 mg by mouth 2 (two) times daily.   traMADol 50 MG tablet Commonly known as: ULTRAM Take 1-2 tablets (50-100 mg total) by mouth every 6 (six) hours as needed for moderate pain.               Discharge Care Instructions   (From admission, onward)           Start     Ordered   04/07/23 0000  Change dressing on IV access line weekly and PRN  (Home infusion instructions - [facility] )        04/07/23 1033            Follow-up Information     Judyann Munson, MD Follow up.   Specialty: Infectious Diseases Why: 05/09/23 at 1:30pm. Please call to reschedule if you are not able to make this appointment. [address] [place] [address] [phone]         [facility] . Go on 04/24/2023.   Specialty: Cardiothoracic Surgery Why: Your appointment is at 2pm. Please obtain a chest x-ray at [facility] [address] [place] 1 hour before the appointment. Contact information: [address] [place], [address] [place] [address] [phone]        [facility] [place]. Go on 05/04/2023.   Specialty: Cardiology Why: Your appointment with Wallis Bamberg is at 11:20am. Contact information: [address] [place] [address] [phone]        [facility] at [place]. Go on 05/12/2023.   Specialty: Cardiology Why: Your appointment for follow up Echocardiogram is at 1PM. Contact information: [address] [place] [address] [phone]                The patient has been discharged on:   1.Beta Blocker:  Yes [   x]                              No   [   ]                              If No, reason:  2.Ace Inhibitor/ARB: Yes [   ]                                     No  [  x  ]                                     If No, reason: Not indicated  3.Statin:   Yes [  x ]                  No  [   ]                  If No, reason:  4.Ecasa:  Yes  [  x ]                  No   [   ]  If No, reason:  5. ACS on Admission? No  P2Y12 Inhibitor:  Yes  [   ]                                No  [ x  ]    Signed: Leary Roca 04/07/2023, 12:39 PM

## 2023-04-06 NOTE — Progress Notes (Signed)
Patient brought to 4E from 2H. VSS. Telemetry box applied, CCMD notified. Patient oriented to room and staff. Call bell in reach. ? ?Kenard Gower, RN  ?

## 2023-04-06 NOTE — Progress Notes (Addendum)
ANTICOAGULATION CONSULT NOTE - Initial Consult  Pharmacy Consult for warfarin Indication: atrial fibrillation  No Known Allergies  Patient Measurements: Weight: 114.3 kg (251 lb 15.8 oz) Heparin Dosing Weight: 100 kg  Vital Signs: Temp: 98.6 F (37 C) (09/26 1852) Temp Source: Oral (09/26 1852) BP: 121/81 (09/26 1852) Pulse Rate: 103 (09/26 1852)  Labs: Recent Labs    04/04/23 0401 04/05/23 0500 04/05/23 0524 04/06/23 0444  HGB 7.9*  --  8.5* 7.9*  HCT 25.6*  --  28.3* 26.5*  PLT 154  --  212 199  LABPROT  --  16.5*  --  20.9*  INR  --  1.3*  --  1.8*  CREATININE 0.69 0.70  --  0.71    Estimated Creatinine Clearance: 131 mL/min (by C-G formula based on SCr of 0.71 mg/dL).   Medical History: Past Medical History:  Diagnosis Date   Angina pectoris (HCC) Canadian classification 2 04/10/2020   Anxiety    Arthritis    Coronary artery disease coronary CT angio did not show any major issues, suspicion for distal disease 06/12/2020   Depression    DJD (degenerative joint disease)    Dyslipidemia 02/13/2020   Essential hypertension 02/13/2020   Precordial chest pain 02/13/2020    Medications:  Scheduled:    stroke: early stages of recovery book   Does not apply Once   amiodarone  400 mg Oral BID   aspirin EC  81 mg Oral Daily   atorvastatin  80 mg Oral Daily   bisacodyl  10 mg Oral Daily   Or   bisacodyl  10 mg Rectal Daily   Chlorhexidine Gluconate Cloth  6 each Topical Daily   ezetimibe  10 mg Oral Daily   feeding supplement  237 mL Oral TID BM   furosemide  40 mg Oral Daily   heparin  5,000 Units Intravenous Once   insulin aspart  2-6 Units Subcutaneous Q4H   insulin detemir  8 Units Subcutaneous Daily   metoprolol tartrate  12.5 mg Oral BID   mouth rinse  15 mL Mouth Rinse 4 times per day   pantoprazole (PROTONIX) IV  40 mg Intravenous Daily   sodium chloride flush  10-40 mL Intracatheter Q12H   sodium chloride flush  3 mL Intravenous Q12H    tamsulosin  0.4 mg Oral BID   Warfarin - Pharmacist Dosing Inpatient   Does not apply q1600   Infusions:   sodium chloride     sodium chloride Stopped (04/06/23 1048)   ampicillin (OMNIPEN) IV 2 g (04/06/23 1659)   cefTRIAXone (ROCEPHIN)  IV Stopped (04/06/23 1007)   heparin      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 Having recurrent Afib postop, pharmacy now consulted to start warfarin for Afib.   INR of 1.8 is subtherapeutic after warfarin 5 mg dose the last 2 days. Patient had 1 Ensure yesterday, unsure how much of his meals he is eating. No new DDIs identified, patient remains on amiodarone and ampicillin which may increase serum warfarin. CBC is stable.   Goal of Therapy:  INR 2-3 Heparin level 0.3-0.7 units/ml Monitor platelets by anticoagulation protocol: Yes   Plan:  Give warfarin 3 mg x1 Monitor daily INR Monitor for signs/symptoms of bleeding  9/26 PM Update: acute DVT noted in the left internal jugular and a superficial thrombosis of  left upper arm.  Pt has been refusing SCD's.  Given subtherapeutic INR (1.8), will start heparin drip.  Plan: Continue warfarin as above Give heparin bolus 5000 units x1, then start heparin infusion at 1600 units/hr 6 hour heparin level Daily heparin level, INR, CBC Stop heparin bridge when INR >2 for 24h  Trixie Rude, PharmD Clinical Pharmacist 04/06/2023  7:37 PM

## 2023-04-06 NOTE — Progress Notes (Signed)
Occupational Therapy Treatment Patient Details Name: Dale Mills MRN: [id number] DOB: [birth year] Today's Date: 04/06/2023   History of present illness Pt is a 57 y/o M presenting to [name] with fever, nausea, chills, hypotensive, CXR with flash pulmonary edema, intubated. 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  PMH includes HTN, CAD, recurrent UTI, pyelonephritis, L THA   OT comments  Patient with fair progress toward patient focused goals.  Patient needing less assist for sit to stand and in room mobility/toileting.  Continues to struggle with vision to the left, fair balance and fair safety.  OT will continue efforts in the acute setting to address deficits, and Patient will benefit from intensive inpatient follow up therapy, >3 hours/day       If plan is discharge home, recommend the following:  Two people to help with walking and/or transfers;A lot of help with bathing/dressing/bathroom;Assistance with cooking/housework;Direct supervision/assist for medications management;Direct supervision/assist for financial management;Assist for transportation;Help with stairs or ramp for entrance;Supervision due to cognitive status   Equipment Recommendations       Recommendations for Other Services      Precautions / Restrictions Precautions Precautions: Sternal;Fall Precaution Comments: pt able to recall precautions however requires constant cues to adhere to them functionally Restrictions Weight Bearing Restrictions: Yes       Mobility Bed Mobility             Sit to sidelying: Mod assist   Patient Response: Cooperative  Transfers Overall transfer level: Needs assistance   Transfers: Sit to/from Stand, Bed to chair/wheelchair/BSC Sit to  Stand: Mod assist     Step pivot transfers: Mod assist           Balance Overall balance assessment: Needs assistance Sitting-balance support: Feet supported Sitting balance-Leahy Scale: Fair     Standing balance support: Reliant on assistive device for balance Standing balance-Leahy Scale: Poor                             ADL either performed or assessed with clinical judgement   ADL                   Upper Body Dressing : Minimal assistance;Sitting   Lower Body Dressing: Maximal assistance;Sit to/from stand   Toilet Transfer: Moderate assistance;Rolling walker (2 wheels);Regular Toilet   Toileting- Clothing Manipulation and Hygiene: Sit to/from stand;Maximal assistance              Extremity/Trunk Assessment Upper Extremity Assessment Upper Extremity Assessment: Generalized weakness   Lower Extremity Assessment Lower Extremity Assessment: Defer to PT evaluation   Cervical / Trunk Assessment Cervical / Trunk Assessment: Other exceptions    Vision Baseline Vision/History: 1 Wears glasses Ocular Range of Motion: Restricted on the left;Impaired-to be further tested in functional context Alignment/Gaze Preference: Head tilt;Gaze right   Perception Perception Perception: Impaired Preception Impairment Details: Inattention/Neglect   Praxis      Cognition Arousal: Alert Behavior During Therapy: WFL for tasks assessed/performed Overall Cognitive Status: Impaired/Different from baseline                       Memory: Decreased short-term memory, Decreased recall of precautions Following Commands: Follows one step commands with increased time Safety/Judgement: Decreased awareness  of deficits Awareness: Intellectual Problem Solving: Slow processing, Requires verbal cues, Difficulty sequencing, Decreased initiation                             Pertinent Vitals/ Pain       Pain Assessment Pain Assessment: Faces Faces  Pain Scale: No hurt                                                          Frequency  Min 1X/week        Progress Toward Goals  OT Goals(current goals can now be found in the care plan section)  Progress towards OT goals: Progressing toward goals  Acute Rehab OT Goals OT Goal Formulation: With patient Time For Goal Achievement: 04/17/23 Potential to Achieve Goals: Good  Plan      Co-evaluation                 AM-PAC OT "6 Clicks" Daily Activity     Outcome Measure   Help from another person eating meals?: A Little Help from another person taking care of personal grooming?: A Little Help from another person toileting, which includes using toliet, bedpan, or urinal?: A Lot Help from another person bathing (including washing, rinsing, drying)?: A Lot Help from another person to put on and taking off regular upper body clothing?: A Lot Help from another person to put on and taking off regular lower body clothing?: A Lot 6 Click Score: 14    End of Session Equipment Utilized During Treatment: Gait belt  OT Visit Diagnosis: Unsteadiness on feet (R26.81);Other abnormalities of gait and mobility (R26.89);Muscle weakness (generalized) (M62.81);Other symptoms and signs involving the nervous system (R29.898);Other symptoms and signs involving cognitive function   Activity Tolerance Patient tolerated treatment well   Patient Left in bed;with call bell/phone within reach;with bed alarm set   Nurse Communication Mobility status        Time: 1035-1106 OT Time Calculation (min): 31 min  Charges: OT General Charges $OT Visit: 1 Visit OT Treatments $Self Care/Home Management : 23-37 mins  04/06/2023  RP, OTR/L  Acute Rehabilitation Services  Office:  [phone]   Suzanna Obey 04/06/2023, 11:14 AM

## 2023-04-06 NOTE — Progress Notes (Signed)
Patient ID: Dale Mills, male   DOB: Sep 06, 1965, 57 y.o.   MRN: [id number]  TCTS Evening Rounds:  Hemodynamically stable   Sats 97%  Good urine output.  Venous duplex today shows acute DVT in the left internal jugular vein and superficial thrombosis of left cephalic vein.   He is on Coumadin with INR 1.8.

## 2023-04-06 NOTE — Discharge Instructions (Addendum)
Discharge Instructions:  1. You may shower, please wash incisions daily with soap and water and keep dry.  If you wish to cover wounds with dressing you may do so but please keep clean and change daily.  No tub baths or swimming until incisions have completely healed.  If your incisions become red or develop any drainage please call our office at [phone]  2. No Driving until cleared by Dr. Karolee Ohs office and you are no longer using narcotic pain medications  3. Monitor your weight daily.. Please use the same scale and weigh at same time... If you gain 5-10 lbs in 48 hours with associated lower extremity swelling, please contact our office at [phone]  4. Fever of 101.5 for at least 24 hours with no source, please contact our office at [phone]  5. Activity- up as tolerated, please walk at least 3 times per day.  Avoid strenuous activity, no lifting, pushing, or pulling with your arms over 8-10 lbs for a minimum of 6 weeks  6. If any questions or concerns arise, please do not hesitate to contact our office at [phone]  7. You will need to remain on the IV antibiotics as prescribed by Dr. Drue Second (infectious disease) for 6 weeks  with a start date of 03/30/25.   Information on my medicine - Coumadin   (Warfarin)  This medication education was reviewed with me or my healthcare representative as part of my discharge preparation.  The pharmacist that spoke with me during my hospital stay was:  Cedric Fishman, [facility]  Why was Coumadin prescribed for you? Coumadin was prescribed for you because you have a blood clot or a medical condition that can cause an increased risk of forming blood clots. Blood clots can cause serious health problems by blocking the flow of blood to the heart, lung, or brain. Coumadin can prevent harmful blood clots from forming. As a reminder your indication for Coumadin is:  Deep Venous Thrombosis treatment  What test will check on my response to  Coumadin? While on Coumadin (warfarin) you will need to have an INR test regularly to ensure that your dose is keeping you in the desired range. The INR (international normalized ratio) number is calculated from the result of the laboratory test called prothrombin time (PT).  If an INR APPOINTMENT HAS NOT ALREADY BEEN MADE FOR YOU please schedule an appointment to have this lab work done by your health care provider within 7 days. Your INR goal is usually a number between:  2 to 3 or your provider may give you a more narrow range like 2-2.5.  Ask your health care provider during an office visit what your goal INR is.  What  do you need to  know  About  COUMADIN? Take Coumadin (warfarin) exactly as prescribed by your healthcare provider about the same time each day.  DO NOT stop taking without talking to the doctor who prescribed the medication.  Stopping without other blood clot prevention medication to take the place of Coumadin may increase your risk of developing a new clot or stroke.  Get refills before you run out.  What do you do if you miss a dose? If you miss a dose, take it as soon as you remember on the same day then continue your regularly scheduled regimen the next day.  Do not take two doses of Coumadin at the same time.  Important Safety Information A possible side effect of Coumadin (Warfarin) is an increased risk of bleeding.  You should call your healthcare provider right away if you experience any of the following: Bleeding from an injury or your nose that does not stop. Unusual colored urine (red or dark brown) or unusual colored stools (red or black). Unusual bruising for unknown reasons. A serious fall or if you hit your head (even if there is no bleeding).  Some foods or medicines interact with Coumadin (warfarin) and might alter your response to warfarin. To help avoid this: Eat a balanced diet, maintaining a consistent amount of Vitamin K. Notify your provider about major  diet changes you plan to make. Avoid alcohol or limit your intake to 1 drink for women and 2 drinks for men per day. (1 drink is 5 oz. wine, 12 oz. beer, or 1.5 oz. liquor.)  Make sure that ANY health care provider who prescribes medication for you knows that you are taking Coumadin (warfarin).  Also make sure the healthcare provider who is monitoring your Coumadin knows when you have started a new medication including herbals and non-prescription products.  Coumadin (Warfarin)  Major Drug Interactions  Increased Warfarin Effect Decreased Warfarin Effect  Alcohol (large quantities) Antibiotics (esp. Septra/Bactrim, Flagyl, Cipro) Amiodarone (Cordarone) Aspirin (ASA) Cimetidine (Tagamet) Megestrol (Megace) NSAIDs (ibuprofen, naproxen, etc.) Piroxicam (Feldene) Propafenone (Rythmol SR) Propranolol (Inderal) Isoniazid (INH) Posaconazole (Noxafil) Barbiturates (Phenobarbital) Carbamazepine (Tegretol) Chlordiazepoxide (Librium) Cholestyramine (Questran) Griseofulvin Oral Contraceptives Rifampin Sucralfate (Carafate) Vitamin K   Coumadin (Warfarin) Major Herbal Interactions  Increased Warfarin Effect Decreased Warfarin Effect  Garlic Ginseng Ginkgo biloba Coenzyme Q10 Green tea St. John's wort    Coumadin (Warfarin) FOOD Interactions  Eat a consistent number of servings per week of foods HIGH in Vitamin K (1 serving =  cup)  Collards (cooked, or boiled & drained) Kale (cooked, or boiled & drained) Mustard greens (cooked, or boiled & drained) Parsley *serving size only =  cup Spinach (cooked, or boiled & drained) Swiss chard (cooked, or boiled & drained) Turnip greens (cooked, or boiled & drained)  Eat a consistent number of servings per week of foods MEDIUM-HIGH in Vitamin K (1 serving = 1 cup)  Asparagus (cooked, or boiled & drained) Broccoli (cooked, boiled & drained, or raw & chopped) Brussel sprouts (cooked, or boiled & drained) *serving size only =   cup Lettuce, raw (green leaf, endive, romaine) Spinach, raw Turnip greens, raw & chopped   These websites have more information on Coumadin (warfarin):  http://www.king-russell.com/; https://www.hines.net/;

## 2023-04-06 NOTE — Progress Notes (Signed)
[facility]  Date of Admission:  03/28/2023     Total days of antibiotics 10         ASSESSMENT:  Mr. Greim MRI of the left hip with no clear evidence of infection and overall continues to clinically improve. 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 Anticipate likely suppressive course with oral antibiotics after completion of IV with duration to be determined. PICC line in place. OPAT/Home Health orders. EOT tentatively planned for 05/12/23. Will arrange follow up in ID clinic. Remaining medical and supportive care per CCM/CVTS.   PLAN:  Continue current dose of ampicillin and ceftriaxone through 05/12/23.  Home Health / OPAT. Post operative wound care per CVTS. Follow up in ID clinic. Remaining medical and supportive care per CCM.  ID will sign off. Please re-consult if needed.  Diagnosis:  Mitral valve/Aortic Valve Endocarditis and Bacteremia  Culture Result: Enterococcus faecalis  No Known Allergies  OPAT Orders Discharge antibiotics to be given via PICC line Discharge antibiotics: Ampicillin / Ceftriaxone Per pharmacy protocol  Aim for Vancomycin trough 15-20 or AUC 400-550 (unless otherwise indicated) Duration: 6 weeks  End Date: 05/12/23  [facility] Care Per Protocol:  Home health RN for IV administration and teaching; PICC line care and labs.    Labs weekly while on IV antibiotics: _X_ CBC with differential _X_ BMP __ CMP _X_ CRP _X_ ESR __ Vancomycin trough __ CK  __ Please pull PIC at completion of IV antibiotics __ Please leave PIC in place until doctor has seen patient or been notified  Fax weekly labs to [phone]  Clinic Follow Up Appt:  05/09/23 at 1:30 pm with Dr. Drue Second   Principal Problem:   Endocarditis of mitral valve Active Problems:   Enterococcal bacteremia   Acute respiratory failure  (HCC)   Septic shock (HCC)   Protein-calorie malnutrition, severe   S/P aortic valve replacement and aortoplasty     stroke: early stages of recovery book   Does not apply Once   acetaminophen  1,000 mg Oral Q6H   amiodarone  400 mg Oral BID   aspirin EC  81 mg Oral Daily   atorvastatin  80 mg Oral Daily   bisacodyl  10 mg Oral Daily   Or   bisacodyl  10 mg Rectal Daily   Chlorhexidine Gluconate Cloth  6 each Topical Daily   ezetimibe  10 mg Oral Daily   feeding supplement  237 mL Oral TID BM   furosemide  40 mg Oral Daily   insulin aspart  2-6 Units Subcutaneous Q4H   insulin detemir  8 Units Subcutaneous Daily   metoprolol tartrate  12.5 mg Oral BID   mouth rinse  15 mL Mouth Rinse 4 times per day   pantoprazole (PROTONIX) IV  40 mg Intravenous Daily   potassium chloride  20 mEq Oral Q4H   sodium chloride flush  10-40 mL Intracatheter Q12H   sodium chloride flush  3 mL Intravenous Q12H   tamsulosin  0.4 mg Oral BID   Warfarin - Pharmacist Dosing Inpatient   Does not apply q1600    SUBJECTIVE:  Afebrile overnight with no acute events. No new concerns/complaints.   No Known Allergies   Review of Systems: Review of Systems  Constitutional:  Negative for chills, fever and weight loss.  Respiratory:  Negative for cough, shortness of breath and wheezing.  Cardiovascular:  Negative for chest pain and leg swelling.  Gastrointestinal:  Negative for abdominal pain, constipation, diarrhea, nausea and vomiting.  Skin:  Negative for rash.      OBJECTIVE: Vitals:   04/06/23 0800 04/06/23 0900 04/06/23 1000 04/06/23 1105  BP: 114/73 105/66 117/79 116/76  Pulse: (!) 106 (!) 104 (!) 103 (!) 108  Resp: (!) 26 (!) 21 (!) 24 (!) 23  Temp:      TempSrc:      SpO2: 95% 94% 95% 94%  Weight:       Body mass index is 35.14 kg/m.  Physical Exam Constitutional:      General: He is not in acute distress.    Appearance: He is well-developed.  Cardiovascular:     Rate and  Rhythm: Normal rate and regular rhythm.     Heart sounds: Normal heart sounds.     Comments: PICC line right upper extremity.  Pulmonary:     Effort: Pulmonary effort is normal.     Breath sounds: Normal breath sounds.  Skin:    General: Skin is warm and dry.  Neurological:     Mental Status: He is alert and oriented to person, place, and time.     Lab Results Lab Results  Component Value Date   WBC 10.3 04/06/2023   HGB 7.9 (L) 04/06/2023   HCT 26.5 (L) 04/06/2023   MCV 91.1 04/06/2023   PLT 199 04/06/2023    Lab Results  Component Value Date   CREATININE 0.71 04/06/2023   BUN 12 04/06/2023   NA 140 04/06/2023   K 3.4 (L) 04/06/2023   CL 103 04/06/2023   CO2 30 04/06/2023    Lab Results  Component Value Date   ALT 29 03/29/2023   AST 49 (H) 03/29/2023   ALKPHOS 104 03/29/2023   BILITOT 2.2 (H) 03/29/2023     Microbiology: Recent Results (from the past 240 hour(s))  MRSA Next Gen by PCR, Nasal     Status: None   Collection Time: 03/28/23  5:38 PM   Specimen: Nasal Mucosa; Nasal Swab  Result Value Ref Range Status   MRSA by PCR Next Gen NOT DETECTED NOT DETECTED Final    Comment: (NOTE) The GeneXpert MRSA Assay (FDA approved for NASAL specimens only), is one component of a comprehensive MRSA colonization surveillance program. It is not intended to diagnose MRSA infection nor to guide or monitor treatment for MRSA infections. Test performance is not FDA approved in patients less than 87 years old. Performed at [name], [address] [place], [place] [address]   Culture, blood (Routine X 2) w Reflex to ID Panel     Status: None   Collection Time: 03/29/23  3:01 AM   Specimen: BLOOD RIGHT HAND  Result Value Ref Range Status   Specimen Description BLOOD RIGHT HAND  Final   Special Requests   Final    BOTTLES DRAWN AEROBIC AND ANAEROBIC Blood Culture adequate volume   Culture   Final    NO GROWTH 5 DAYS Performed at [facility], [address], [place], [place] [address]    Report Status 04/03/2023 FINAL  Final  Culture, blood (Routine X 2) w Reflex to ID Panel     Status: None   Collection Time: 03/29/23  3:04 AM   Specimen: BLOOD RIGHT ARM  Result Value Ref Range Status   Specimen Description BLOOD RIGHT ARM  Final   Special Requests   Final  BOTTLES DRAWN AEROBIC AND ANAEROBIC Blood Culture adequate volume   Culture   Final    NO GROWTH 5 DAYS Performed at [facility], [address] [place], [place] [address]    Report Status 04/03/2023 FINAL  Final  Fungus Culture With Stain     Status: None (Preliminary result)   Collection Time: 03/31/23 10:08 AM   Specimen: Path Tissue  Result Value Ref Range Status   Fungus Stain Final report  Final    Comment: (NOTE) Performed At: [facility] [address] [place], [place] [id number] Jolene Schimke MD [id number]    Fungus (Mycology) Culture PENDING  Incomplete   Fungal Source anterior mitral valve leafle  Final    Comment: Performed at [facility], [address], [place], [place] [address]  Aerobic/Anaerobic Culture w Gram Stain (surgical/deep wound)     Status: None   Collection Time: 03/31/23 10:08 AM   Specimen: Path Tissue  Result Value Ref Range Status   Specimen Description TISSUE  Final   Special Requests anterior mitral valve leafle  Final   Gram Stain NO WBC SEEN ABUNDANT GRAM POSITIVE COCCI   Final   Culture   Final    MODERATE ENTEROCOCCUS FAECALIS NO ANAEROBES ISOLATED Performed at [facility], [address], [place], [place] [address]    Report Status 04/05/2023 FINAL  Final   Organism ID, Bacteria ENTEROCOCCUS FAECALIS  Final      Susceptibility   Enterococcus faecalis - MIC*    AMPICILLIN <=2 SENSITIVE Sensitive     VANCOMYCIN 2 SENSITIVE Sensitive     GENTAMICIN SYNERGY SENSITIVE Sensitive     * MODERATE ENTEROCOCCUS FAECALIS  Acid Fast Smear (AFB)     Status: None   Collection Time: 03/31/23 10:08 AM    Specimen: Path Tissue  Result Value Ref Range Status   AFB Specimen Processing Comment  Final    Comment: Tissue Grinding and Digestion/Decontamination   Acid Fast Smear Negative  Final    Comment: (NOTE) Performed At: [facility] [address] [place], [place] [id number] Jolene Schimke MD [id number]:[id number]    Source (AFB) anterior mitral valve leaflet  Final    Comment: Performed at [facility], [address], [place], [place] [address]  Fungus Culture Result     Status: None   Collection Time: 03/31/23 10:08 AM  Result Value Ref Range Status   Result 1 Comment  Final    Comment: (NOTE) KOH/Calcofluor preparation:  no fungus observed. Performed At: [facility] [address] [place] [address] [place], [place] [id number] Jolene Schimke MD [id number]   Fungus Culture With Stain     Status: None (Preliminary result)   Collection Time: 03/31/23 10:23 AM   Specimen: Path Tissue  Result Value Ref Range Status   Fungus Stain Final report  Final    Comment: (NOTE) Performed At: [facility] [address] [place], [place] [id number] Jolene Schimke MD [id number]    Fungus (Mycology) Culture PENDING  Incomplete   Fungal Source aortic valve leaflet  Final    Comment: Performed at [facility], [address] [place], [place] [address]  Aerobic/Anaerobic Culture w Gram Stain (surgical/deep wound)     Status: None   Collection Time: 03/31/23 10:23 AM   Specimen: Path Tissue  Result Value Ref Range Status   Specimen Description TISSUE  Final   Special Requests aortic valve leaflet  Final   Gram Stain NO WBC SEEN RARE  GRAM POSITIVE COCCI IN PAIRS   Final   Culture   Final    FEW ENTEROCOCCUS FAECALIS SUSCEPTIBILITIES PERFORMED ON PREVIOUS CULTURE WITHIN THE LAST 5 DAYS. NO ANAEROBES ISOLATED Performed at [name], [address], [place], [place] [address]    Report Status 04/05/2023 FINAL  Final  Acid Fast Smear (AFB)     Status: None    Collection Time: 03/31/23 10:23 AM   Specimen: Path Tissue  Result Value Ref Range Status   AFB Specimen Processing Comment  Final    Comment: Tissue Grinding and Digestion/Decontamination   Acid Fast Smear Negative  Final    Comment: (NOTE) Performed At: [facility] [address] [place], [place] [id number] Jolene Schimke MD [id number]    Source (AFB) aortic valve leaflet  Final    Comment: Performed at [facility], [address], [place], [place] [address]  Fungus Culture Result     Status: None   Collection Time: 03/31/23 10:23 AM  Result Value Ref Range Status   Result 1 Comment  Final    Comment: (NOTE) KOH/Calcofluor preparation:  no fungus observed. Performed At: [facility] [address] [place], [place] [id number] Jolene Schimke MD [id number]      Marcos Eke, NP [facility]  04/06/2023  11:59 AM

## 2023-04-07 ENCOUNTER — Ambulatory Visit: Payer: Self-pay

## 2023-04-07 LAB — BASIC METABOLIC PANEL
Anion gap: 9 (ref 5–15)
BUN: 13 mg/dL (ref 6–20)
CO2: 27 mmol/L (ref 22–32)
Calcium: 8.4 mg/dL — ABNORMAL LOW (ref 8.9–10.3)
Chloride: 103 mmol/L (ref 98–111)
Creatinine, Ser: 0.73 mg/dL (ref 0.61–1.24)
GFR, Estimated: >60 mL/min (ref >60)
Glucose, Bld: 114 mg/dL — ABNORMAL HIGH (ref 70–99)
Potassium: 3.9 mmol/L (ref 3.5–5.1)
Sodium: 139 mmol/L (ref 135–145)

## 2023-04-07 LAB — GLUCOSE, CAPILLARY
Glucose-Capillary: 108 mg/dL — ABNORMAL HIGH (ref 70–99)
Glucose-Capillary: 110 mg/dL — ABNORMAL HIGH (ref 70–99)
Glucose-Capillary: 112 mg/dL — ABNORMAL HIGH (ref 70–99)
Glucose-Capillary: 119 mg/dL — ABNORMAL HIGH (ref 70–99)
Glucose-Capillary: 129 mg/dL — ABNORMAL HIGH (ref 70–99)
Glucose-Capillary: 145 mg/dL — ABNORMAL HIGH (ref 70–99)
Glucose-Capillary: 98 mg/dL (ref 70–99)

## 2023-04-07 LAB — HEPARIN LEVEL (UNFRACTIONATED)
Heparin Unfractionated: <0.1 [IU]/mL — ABNORMAL LOW (ref 0.30–0.70)
Heparin Unfractionated: <0.1 [IU]/mL — ABNORMAL LOW (ref 0.30–0.70)

## 2023-04-07 LAB — CBC
HCT: 25.1 % — ABNORMAL LOW (ref 39.0–52.0)
Hemoglobin: 7.6 g/dL — ABNORMAL LOW (ref 13.0–17.0)
MCH: 27.7 pg (ref 26.0–34.0)
MCHC: 30.3 g/dL (ref 30.0–36.0)
MCV: 91.6 fL (ref 80.0–100.0)
Platelets: 225 10*3/uL (ref 150–400)
RBC: 2.74 MIL/uL — ABNORMAL LOW (ref 4.22–5.81)
RDW: 19.7 % — ABNORMAL HIGH (ref 11.5–15.5)
WBC: 11.2 10*3/uL — ABNORMAL HIGH (ref 4.0–10.5)
nRBC: 0 % (ref 0.0–0.2)

## 2023-04-07 LAB — PROTIME-INR
INR: 1.9 — ABNORMAL HIGH (ref 0.8–1.2)
Prothrombin Time: 21.7 s — ABNORMAL HIGH (ref 11.4–15.2)

## 2023-04-07 MED ORDER — ENOXAPARIN SODIUM 120 MG/0.8ML IJ SOSY
110.0000 mg | PREFILLED_SYRINGE | Freq: Two times a day (BID) | INTRAMUSCULAR | Status: DC
  Administered 2023-04-08: 110 mg via SUBCUTANEOUS
  Filled 2023-04-07: qty 0.74

## 2023-04-07 MED ORDER — PANTOPRAZOLE SODIUM 40 MG PO TBEC
40.0000 mg | DELAYED_RELEASE_TABLET | Freq: Every day | ORAL | Status: DC
  Administered 2023-04-08 – 2023-04-09 (×2): 40 mg via ORAL
  Filled 2023-04-07 (×2): qty 1

## 2023-04-07 MED ORDER — SODIUM CHLORIDE 0.9 % IV SOLN: 2.0000 g | Freq: Two times a day (BID) | INTRAVENOUS | Status: DC

## 2023-04-07 MED ORDER — FE FUM-VIT C-VIT B12-FA 460-60-0.01-1 MG PO CAPS: 1.0000 | ORAL_CAPSULE | Freq: Every day | ORAL | Status: DC

## 2023-04-07 MED ORDER — AMPICILLIN IV (FOR PTA / DISCHARGE USE ONLY): 12.0000 g | INTRAVENOUS | 0 refills | Status: DC

## 2023-04-07 MED ORDER — SODIUM CHLORIDE 0.9% FLUSH: 10.0000 mL | INTRAVENOUS | Status: DC | PRN

## 2023-04-07 MED ORDER — SODIUM CHLORIDE 0.9% FLUSH: 10.0000 mL | Freq: Two times a day (BID) | INTRAVENOUS | Status: DC

## 2023-04-07 MED ORDER — TRAMADOL HCL 50 MG PO TABS: 50.0000 mg | ORAL_TABLET | Freq: Four times a day (QID) | ORAL | Status: DC | PRN

## 2023-04-07 MED ORDER — FE FUM-VIT C-VIT B12-FA 460-60-0.01-1 MG PO CAPS
1.0000 | ORAL_CAPSULE | Freq: Every day | ORAL | Status: DC
  Administered 2023-04-07 – 2023-04-09 (×3): 1 via ORAL
  Filled 2023-04-07 (×3): qty 1

## 2023-04-07 MED ORDER — EZETIMIBE 10 MG PO TABS: 10.0000 mg | ORAL_TABLET | Freq: Every day | ORAL | Status: DC

## 2023-04-07 MED ORDER — AMIODARONE HCL 400 MG PO TABS: 400.0000 mg | ORAL_TABLET | Freq: Two times a day (BID) | ORAL | Status: DC

## 2023-04-07 MED ORDER — INSULIN ASPART 100 UNIT/ML IJ SOLN
2.0000 [IU] | Freq: Three times a day (TID) | INTRAMUSCULAR | Status: DC
  Administered 2023-04-08 – 2023-04-09 (×3): 2 [IU] via SUBCUTANEOUS

## 2023-04-07 MED ORDER — ENOXAPARIN SODIUM 120 MG/0.8ML IJ SOSY: 110.0000 mg | PREFILLED_SYRINGE | Freq: Two times a day (BID) | INTRAMUSCULAR | Status: DC

## 2023-04-07 MED ORDER — WARFARIN SODIUM 2 MG PO TABS
3.0000 mg | ORAL_TABLET | Freq: Once | ORAL | Status: AC
  Administered 2023-04-07: 3 mg via ORAL
  Filled 2023-04-07: qty 1

## 2023-04-07 MED ORDER — HEPARIN BOLUS VIA INFUSION
3000.0000 [IU] | Freq: Once | INTRAVENOUS | Status: AC
  Administered 2023-04-07: 3000 [IU] via INTRAVENOUS
  Filled 2023-04-07: qty 3000

## 2023-04-07 MED ORDER — METOPROLOL TARTRATE 25 MG PO TABS: 12.5000 mg | ORAL_TABLET | Freq: Two times a day (BID) | ORAL | Status: DC

## 2023-04-07 MED ORDER — ENSURE ENLIVE PO LIQD: 237.0000 mL | Freq: Three times a day (TID) | ORAL | Status: DC

## 2023-04-07 MED ORDER — ORAL CARE MOUTH RINSE: 15.0000 mL | Freq: Two times a day (BID) | OROMUCOSAL | Status: DC

## 2023-04-07 MED ORDER — ASPIRIN 81 MG PO TBEC: 81.0000 mg | DELAYED_RELEASE_TABLET | Freq: Every day | ORAL | Status: DC

## 2023-04-07 MED ORDER — FUROSEMIDE 40 MG PO TABS: 40.0000 mg | ORAL_TABLET | Freq: Every day | ORAL | Status: DC

## 2023-04-07 MED ORDER — HEPARIN BOLUS VIA INFUSION
2000.0000 [IU] | Freq: Once | INTRAVENOUS | Status: AC
  Administered 2023-04-07: 2000 [IU] via INTRAVENOUS
  Filled 2023-04-07: qty 2000

## 2023-04-07 MED ORDER — CEFTRIAXONE IV (FOR PTA / DISCHARGE USE ONLY): 2.0000 g | Freq: Two times a day (BID) | INTRAVENOUS | Status: DC

## 2023-04-07 MED ORDER — ENOXAPARIN SODIUM 120 MG/0.8ML IJ SOSY
110.0000 mg | PREFILLED_SYRINGE | Freq: Two times a day (BID) | INTRAMUSCULAR | Status: AC
  Administered 2023-04-07: 110 mg via SUBCUTANEOUS
  Filled 2023-04-07: qty 0.74

## 2023-04-07 NOTE — Progress Notes (Signed)
ANTICOAGULATION CONSULT NOTE - Initial Consult  Pharmacy Consult for warfarin Indication: atrial fibrillation  No Known Allergies  Patient Measurements: Weight: 110.6 kg (243 lb 13.3 oz) (done by donna rn) Heparin Dosing Weight: 100 kg  Vital Signs: Temp: 99.7 F (37.6 C) (09/27 1116) Temp Source: Oral (09/27 1116) BP: 122/78 (09/27 1116) Pulse Rate: 100 (09/27 1116)  Labs: Recent Labs    04/05/23 0500 04/05/23 0524 04/05/23 0524 04/06/23 0444 04/07/23 0240 04/07/23 1003  HGB  --  8.5*   < > 7.9* 7.6*  --   HCT  --  28.3*  --  26.5* 25.1*  --   PLT  --  212  --  199 225  --   LABPROT 16.5*  --   --  20.9* 21.7*  --   INR 1.3*  --   --  1.8* 1.9*  --   HEPARINUNFRC  --   --   --   --  <0.10* <0.10*  CREATININE 0.70  --   --  0.71 0.73  --    < > = values in this interval not displayed.    Estimated Creatinine Clearance: 128.8 mL/min (by C-G formula based on SCr of 0.73 mg/dL).   Medical History: Past Medical History:  Diagnosis Date   Angina pectoris (HCC) Canadian classification 2 04/10/2020   Anxiety    Arthritis    Coronary artery disease coronary CT angio did not show any major issues, suspicion for distal disease 06/12/2020   Depression    DJD (degenerative joint disease)    Dyslipidemia 02/13/2020   Essential hypertension 02/13/2020   Precordial chest pain 02/13/2020    Medications:  Scheduled:    stroke: early stages of recovery book   Does not apply Once   amiodarone  400 mg Oral BID   aspirin EC  81 mg Oral Daily   atorvastatin  80 mg Oral Daily   bisacodyl  10 mg Oral Daily   Or   bisacodyl  10 mg Rectal Daily   Chlorhexidine Gluconate Cloth  6 each Topical Daily   enoxaparin (LOVENOX) injection  110 mg Subcutaneous Q12H   [START ON 04/08/2023] enoxaparin (LOVENOX) injection  110 mg Subcutaneous Q12H   ezetimibe  10 mg Oral Daily   Fe Fum-Vit C-Vit B12-FA  1 capsule Oral QPC breakfast   feeding supplement  237 mL Oral TID BM   furosemide   40 mg Oral Daily   heparin  3,000 Units Intravenous Once   insulin aspart  2-6 Units Subcutaneous TID AC & HS   insulin detemir  8 Units Subcutaneous Daily   metoprolol tartrate  12.5 mg Oral BID   mouth rinse  15 mL Mouth Rinse 4 times per day   pantoprazole (PROTONIX) IV  40 mg Intravenous Daily   sodium chloride flush  10-40 mL Intracatheter Q12H   sodium chloride flush  3 mL Intravenous Q12H   tamsulosin  0.4 mg Oral BID   Warfarin - Pharmacist Dosing Inpatient   Does not apply q1600   Infusions:   sodium chloride     sodium chloride Stopped (04/06/23 1048)   ampicillin (OMNIPEN) IV 2 g (04/07/23 1113)   cefTRIAXone (ROCEPHIN)  IV 2 g (04/07/23 1011)    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 Having recurrent Afib postop, pharmacy now consulted to start warfarin  for Afib.   INR of 1.9 is subtherapeutic after warfarin 5 mg dose x 2 days and a 1 time 3 mg dose yesterday. No new DDIs identified, patient remains on amiodarone and ampicillin which may increase serum warfarin. CBC is stable.   Acute DVT noted in the left internal jugular and a superficial thrombosis of left upper arm.  Pt has been refusing SCD's.  Given subtherapeutic INR (1.8), heparin drip started on 9/26. 9/27 changed heparin infusion to enoxaparin per pharmacy consult in conversation with CT surgery PA.    Goal of Therapy:  INR 2-3  Enoxaparin goal peak 0.6-1 units/mL, level monitoring not required for this patient   Plan: Stop heparin infusion. A 3000 unit bolus was given earlier for HL < 0.1. Timing of enoxaparin dosing today will take this into account.  Warfarin 3 mg x1 today  Enoxaparin 110 mg q12h. First dose due at 15:00 today x1. Tomorrow, dosing will resume at 06:00 and 18:00.  Stop enoxaparin bridge when INR >2 for 24h and at least 5 days  of parenteral anticoagulant has been given.  Daily INR, CBC  Cedric Fishman, PharmD, BCPS, BCCCP Clinical Pharmacist

## 2023-04-07 NOTE — PMR Pre-admission (Signed)
PMR Admission Coordinator Pre-Admission Assessment  Patient: Dale Mills is an 57 y.o., male MRN: [id number] DOB: [birth year] Height:   Weight: 110.6 kg (done by [employer])  [employer] HMO:     PPO:      PCP:      IPA:      80/20:      OTHER:  PRIMARY: uninsured      Policy#:       Subscriber:  CM Name:       Phone#:      Fax#:  Pre-Cert#:       Employer:  Benefits:  Phone #:      Name:  Eff. Date:      Deduct:       Out of Pocket Max:       Life Max:  CIR:       SNF:  Outpatient:      Co-Pay:  Home Health:       Co-Pay:  DME:      Co-Pay:  Providers:  SECONDARY:       Policy#:      Phone#:   Artist:       Phone#:   The Data processing manager" for patients in Inpatient Rehabilitation Facilities with attached "Privacy Act Statement-Health Care Records" was provided and verbally reviewed with: Patient and Family  Emergency Contact Information Contact Information     Name Relation Home Work Mobile   MORRIS,[name] Daughter   [phone]      Other Contacts     Name Relation Home Work Mobile   White,Amber Daughter   [phone]       Current Medical History  Patient Admitting Diagnosis: 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  In ED he was noted ot have leukocytosis, AKI, and tachycardia.  He was started on fluids which led to flash pulmonary edema requiring intubation.  He was started on broad spectrum ABC and blood cultures showed enterococcus faecalis.  TTE obtained and showed mitral valve vegetation.  Echo showed normal biventricular function with concern for aortic dissection.  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  He required pressors for septic shock.  CTS consulted and on 9/20 he underwent aortic valve and root replacement per Dr. Leafy Ro.  OR culture showed gram positive cocci in pairs.  Post op course complicated by persistent encephalopathy.  Neurology consulted and was discovered pt had acute ischemic infarct, embolic shower in the R PCA territory.  MRI confirmed infact in R PCA territory as well as acute infarcts in bilateral parietal lobes, cerebellum, and small vlumve acute SAH along the right frontal convexity.  He was extubated on 9/21.  He was diuresed and started on lovenox for DVT prophylaxis.  He is tolerating a regular diet.  He did require amio infusion for afib with rvr, currently on PO.  PICC line placed for long term IV abx treatment of his endocarditis per ID recommendations.  He did receive an MRI of his L hip prosthesis to confirm no infection and it was negative.  He did develop a LUE DVT on POD 7 and was started on heparin, which was discontinued on 9/27.  Therapy evaluations  were completed and pt was recommended for CIR>   Patient's medical record from [facility] has been reviewed by the rehabilitation admission coordinator and physician.  Past Medical History  Past Medical History:  Diagnosis Date   Angina pectoris (HCC) Canadian classification 2 04/10/2020   Anxiety    Arthritis    Coronary artery disease coronary CT angio did not show any major issues, suspicion for distal disease 06/12/2020   Depression    DJD (degenerative joint disease)    Dyslipidemia 02/13/2020   Essential hypertension 02/13/2020   Precordial chest pain 02/13/2020    Has the patient had major surgery during 100 days prior to admission? Yes  Family History   family history includes CAD in his mother; Cancer in his child; Heart attack in his father and paternal grandfather; Kidney failure in his mother.  Current Medications  Current Facility-Administered Medications:      stroke: early stages of recovery book, , Does not apply, Once, Roddenberry, Myron G, PA-C   0.9 %  sodium chloride infusion, 250 mL, Intravenous, Continuous, Roddenberry, Myron G, PA-C   0.9 %  sodium chloride infusion, , Intravenous, PRN, Lorin Glass, MD, Stopped at 04/06/23 1048   amiodarone (PACERONE) tablet 400 mg, 400 mg, Oral, BID, Eugenio Hoes, MD, 400 mg at 04/07/23 0845   ampicillin (OMNIPEN) 2 g in sodium chloride 0.9 % 100 mL IVPB, 2 g, Intravenous, Q4H, Roddenberry, Myron G, PA-C, Last Rate: 300 mL/hr at 04/07/23 0607, 2 g at 04/07/23 0607   aspirin EC tablet 81 mg, 81 mg, Oral, Daily, Eugenio Hoes, MD, 81 mg at 04/07/23 0845   atorvastatin (LIPITOR) tablet 80 mg, 80 mg, Oral, Daily, Eugenio Hoes, MD, 80 mg at 04/07/23 0845   bisacodyl (DULCOLAX) EC tablet 10 mg, 10 mg, Oral, Daily, 10 mg at 04/06/23 0948 **OR** bisacodyl (DULCOLAX) suppository 10 mg, 10 mg, Rectal, Daily, Roddenberry, Myron G, PA-C, 10 mg at 04/01/23 1021   cefTRIAXone (ROCEPHIN) 2 g in sodium chloride 0.9 % 100 mL IVPB, 2 g, Intravenous, Q12H, Roddenberry, Myron G, PA-C, Last Rate: 200 mL/hr at 04/07/23 1011, 2 g at 04/07/23 1011   Chlorhexidine Gluconate Cloth 2 % PADS 6 each, 6 each, Topical, Daily, Cheri Fowler, MD, 6 each at 04/07/23 0845   ezetimibe (ZETIA) tablet 10 mg, 10 mg, Oral, Daily, Eugenio Hoes, MD, 10 mg at 04/07/23 0844   Fe Fum-Vit C-Vit B12-FA (TRIGELS-F FORTE) capsule 1 capsule, 1 capsule, Oral, QPC breakfast, Lovett Sox, MD, 1 capsule at 04/07/23 1017   feeding supplement (ENSURE ENLIVE / ENSURE PLUS) liquid 237 mL, 237 mL, Oral, TID BM, Eugenio Hoes, MD, 237 mL at 04/07/23 0846   furosemide (LASIX) tablet 40 mg, 40 mg, Oral, Daily, Lorin Glass, MD, 40 mg at 04/07/23 0845   heparin ADULT infusion 100 units/mL (25000 units/223mL), 1,900 Units/hr, Intravenous, Continuous, Arabella Merles, RPH, Last Rate: 19 mL/hr at 04/07/23 0512, 1,900 Units/hr at 04/07/23 0512   insulin aspart  (novoLOG) injection 2-6 Units, 2-6 Units, Subcutaneous, TID AC & HS, Lovett Sox, MD   insulin detemir (LEVEMIR) injection 8 Units, 8 Units, Subcutaneous, Daily, Lorin Glass, MD, 8 Units at 04/07/23 1013   metoprolol tartrate (LOPRESSOR) injection 2.5-5 mg, 2.5-5 mg, Intravenous, Q2H PRN, Roddenberry, Myron G, PA-C   metoprolol tartrate (LOPRESSOR) tablet 12.5 mg, 12.5 mg, Oral, BID, Eugenio Hoes, MD, 12.5 mg at 04/07/23 0844   morphine (PF) 2 MG/ML injection 1-4 mg, 1-4 mg, Intravenous, Q1H PRN, Leary Roca, PA-C,  2 mg at 04/02/23 1934   ondansetron (ZOFRAN) injection 4 mg, 4 mg, Intravenous, Q6H PRN, Roddenberry, Myron G, PA-C, 4 mg at 04/02/23 1610   Oral care mouth rinse, 15 mL, Mouth Rinse, 4 times per day, Eugenio Hoes, MD, 15 mL at 04/07/23 0846   Oral care mouth rinse, 15 mL, Mouth Rinse, PRN, Eugenio Hoes, MD   oxyCODONE (Oxy IR/ROXICODONE) immediate release tablet 5-10 mg, 5-10 mg, Oral, Q3H PRN, Roddenberry, Myron G, PA-C, 10 mg at 04/04/23 1217   pantoprazole (PROTONIX) injection 40 mg, 40 mg, Intravenous, Daily, Silvana Newness, RPH, 40 mg at 04/07/23 0844   sodium chloride flush (NS) 0.9 % injection 10-40 mL, 10-40 mL, Intracatheter, Q12H, Eugenio Hoes, MD, 10 mL at 04/07/23 0847   sodium chloride flush (NS) 0.9 % injection 10-40 mL, 10-40 mL, Intracatheter, PRN, Eugenio Hoes, MD   sodium chloride flush (NS) 0.9 % injection 3 mL, 3 mL, Intravenous, Q12H, Roddenberry, Myron G, PA-C, 3 mL at 04/06/23 0957   sodium chloride flush (NS) 0.9 % injection 3 mL, 3 mL, Intravenous, PRN, Roddenberry, Myron G, PA-C   tamsulosin (FLOMAX) capsule 0.4 mg, 0.4 mg, Oral, BID, Roddenberry, Myron G, PA-C, 0.4 mg at 04/07/23 0845   traMADol (ULTRAM) tablet 50-100 mg, 50-100 mg, Oral, Q4H PRN, Roddenberry, Myron G, PA-C   traZODone (DESYREL) tablet 50 mg, 50 mg, Oral, QHS PRN, Eugenio Hoes, MD, 50 mg at 04/06/23 2306   Warfarin - Pharmacist Dosing Inpatient, , Does not apply, q1600,  [facility], Given at 04/06/23 1716  Patients Current Diet:  Diet Order             Diet regular Room service appropriate? Yes with Assist; Fluid consistency: Thin  Diet effective now                   Precautions / Restrictions Precautions Precautions: Sternal, Fall Precaution Booklet Issued: No Precaution Comments: pt able to recall precautions however requires constant cues to adhere to them functionally Restrictions Weight Bearing Restrictions: Yes (sternal precaution) RUE Weight Bearing: Non weight bearing LUE Weight Bearing: Non weight bearing Other Position/Activity Restrictions: chest tube   Has the patient had 2 or more falls or a fall with injury in the past year? No  Prior Activity Level Community (5-7x/wk): indep, not working full time, no DME used, driving  Prior Functional Level Self Care: Did the patient need help bathing, dressing, using the toilet or eating? Independent  Indoor Mobility: Did the patient need assistance with walking from room to room (with or without device)? Independent  Stairs: Did the patient need assistance with internal or external stairs (with or without device)? Independent  Functional Cognition: Did the patient need help planning regular tasks such as shopping or remembering to take medications? Independent  Patient Information Are you of Hispanic, Latino/a,or Spanish origin?: A. No, not of Hispanic, Latino/a, or Spanish origin What is your race?: A. White Do you need or want an interpreter to communicate with a doctor or health care staff?: 0. No  Patient's Response To:  Health Literacy and Transportation Is the patient able to respond to health literacy and transportation needs?: Yes Health Literacy - How often do you need to have someone help you when you read instructions, pamphlets, or other written material from your doctor or pharmacy?: Never In the past 12 months, has lack of transportation kept you from  medical appointments or from getting medications?: No In the past 12 months, has lack  of transportation kept you from meetings, work, or from getting things needed for daily living?: No  Journalist, newspaper / Equipment Home Assistive Devices/Equipment: None  Prior Device Use: Indicate devices/aids used by the patient prior to current illness, exacerbation or injury? None of the above  Current Functional Level Cognition  Arousal/Alertness: Awake/alert Overall Cognitive Status: Impaired/Different from baseline Current Attention Level: Sustained, Selective Orientation Level: Oriented X4 Following Commands: Follows one step commands with increased time Safety/Judgement: Decreased awareness of deficits General Comments: requires increased time to process, but incremental improvement.  Pt feeling that it is all need to wake up. Attention: Sustained Sustained Attention: Appears intact Memory: Impaired Memory Impairment: Retrieval deficit (3/5 words) Awareness: Impaired Awareness Impairment: Intellectual impairment, Emergent impairment Problem Solving: Impaired Safety/Judgment: Impaired    Extremity Assessment (includes Sensation/Coordination)  Upper Extremity Assessment: Generalized weakness RUE Deficits / Details: global weakness, but WFL for basic ROM within limits of sternal prec RUE Coordination: decreased fine motor LUE Deficits / Details: global weakness compared to RUE, decr coordination during functional reach task LUE Coordination: decreased fine motor  Lower Extremity Assessment: Defer to PT evaluation    ADLs  Overall ADL's : Needs assistance/impaired Eating/Feeding: Set up, Sitting Eating/Feeding Details (indicate cue type and reason): drinking water Grooming: Minimal assistance, Sitting Grooming Details (indicate cue type and reason): cues for sequencing Upper Body Bathing: Moderate assistance Lower Body Bathing: Maximal assistance Upper Body Dressing : Minimal  assistance, Sitting Upper Body Dressing Details (indicate cue type and reason): donning gown Lower Body Dressing: Maximal assistance, Sit to/from stand Toilet Transfer: Moderate assistance, Rolling walker (2 wheels), Regular Toilet Toilet Transfer Details (indicate cue type and reason): with Programmer, applications- Clothing Manipulation and Hygiene: Sit to/from stand, Maximal assistance Functional mobility during ADLs: Moderate assistance, +2 for safety/equipment    Mobility  Overal bed mobility: Needs Assistance Bed Mobility: Sit to Supine Sidelying to sit: Min assist Supine to sit: Mod assist, +2 for physical assistance Sit to sidelying: Mod assist General bed mobility comments: pt maneuvered LE's off bed.  Assisted trunk up and forward.  LE assist to supine.  Pt able to scoot to EOB without assist.    Transfers  Overall transfer level: Needs assistance Equipment used:  (eva walker) Transfers: Sit to/from Stand Sit to Stand: Mod assist Bed to/from chair/wheelchair/BSC transfer type:: Step pivot Step pivot transfers: Mod assist General transfer comment: light mod assist for coming forward more than boost, use of heart pillow to reinforce no use of UE's    Ambulation / Gait / Stairs / Wheelchair Mobility  Ambulation/Gait Ambulation/Gait assistance: Min assist, Mod assist Gait Distance (Feet): 160 Feet Assistive device: Rolling walker (2 wheels) Gait Pattern/deviations: Step-through pattern, Decreased stride length, Narrow base of support, Drifts right/left, Scissoring General Gait Details: mild unsteadiness overall with some degradation as his involved side fatigued.  Mod assist generally with short episodes of min assist. Gait velocity: dec Gait velocity interpretation: <1.31 ft/sec, indicative of household ambulator    Posture / Balance Dynamic Sitting Balance Sitting balance - Comments: sits statically, tends to lean posteriorly but needs incr cues to bring chest forward,  difficult to maintain Balance Overall balance assessment: Needs assistance Sitting-balance support: Feet supported Sitting balance-Leahy Scale: Fair Sitting balance - Comments: sits statically, tends to lean posteriorly but needs incr cues to bring chest forward, difficult to maintain Standing balance support: Reliant on assistive device for balance Standing balance-Leahy Scale: Poor Standing balance comment: dependent on external support    Special needs/care  consideration Continuous Drip IV  ampicillin and ceftriaxone, Skin sternal incision, and Diabetic management yes   Previous Home Environment (from acute therapy documentation) Living Arrangements: Alone Available Help at Discharge: Family Type of Home: House Home Layout: One level Home Access: Ramped entrance Home Care Services: No Additional Comments: plans to go to daughter's home, reports is handicapped accessable  Discharge Living Setting Plans for Discharge Living Setting: Other (Comment) (ALF versus home with daughter) Type of Home at Discharge: House Discharge Home Layout: One level Discharge Home Access: Ramped entrance Discharge Bathroom Shower/Tub: Tub/shower unit Discharge Bathroom Toilet: Standard Discharge Bathroom Accessibility: Yes How Accessible: Accessible via walker Does the patient have any problems obtaining your medications?: No  Social/Family/Support Systems Anticipated Caregiver: daughters Hospital doctor and [place] Anticipated Industrial/product designer Information: Hospital doctor [phone]; Chrissy [phone] Ability/Limitations of Caregiver: min assist Caregiver Availability: 24/7 Discharge Plan Discussed with Primary Caregiver: Yes Is Caregiver In Agreement with Plan?: Yes Does Caregiver/Family have Issues with Lodging/Transportation while Pt is in Rehab?: No  Goals Patient/Family Goal for Rehab: PT/OT/SLP supervision Expected length of stay: 10-14 days Additional Information: Discharge plan: ideally will  discharge directly to an ALF, however if that is not possible can d/c home with Amber until they are able to place him. Pt/Family Agrees to Admission and willing to participate: Yes Program Orientation Provided & Reviewed with Pt/Caregiver Including Roles  & Responsibilities: Yes Additional Information Needs: medicaid application pending  Barriers to Discharge: Insurance for SNF coverage, Decreased caregiver support  Decrease burden of Care through IP rehab admission: n/a  Possible need for SNF placement upon discharge:  Not anticipated. Plan for discharge directly to ALF versus home with daughter [name] if bed not available at time of discharge.    Patient Condition: I have reviewed medical records from [facility], spoken with  [facility] , and patient and daughter. I met with patient at the bedside and discussed via phone for inpatient rehabilitation assessment.  Patient will benefit from ongoing PT, OT, and SLP, can actively participate in 3 hours of therapy a day 5 days of the week, and can make measurable gains during the admission.  Patient will also benefit from the coordinated team approach during an Inpatient Acute Rehabilitation admission.  The patient will receive intensive therapy as well as Rehabilitation physician, nursing, social worker, and care management interventions.  Due to bladder management, bowel management, safety, skin/wound care, disease management, medication administration, pain management, and patient education the patient requires 24 hour a day rehabilitation nursing.  The patient is currently min assist to mod assist with mobility and basic ADLs.  Discharge setting and therapy post discharge at  home  is anticipated.  Patient has agreed to participate in the Acute Inpatient Rehabilitation Program and will admit today.  Preadmission Screen Completed By:  Stephania Fragmin, PT, DPT  04/07/2023 11:06 AM ______________________________________________________________________    Discussed status with Dr. Natale Lay on 04/07/23  at 11:06 AM  and received approval for admission today.  Admission Coordinator:  Stephania Fragmin, PT, DPT time 11:06 AM Dorna Bloom 04/07/23    Assessment/Plan: Diagnosis:Cardiac debility status post MVR and aortic root replacement 03/31/2023 Does the need for close, 24 hr/day Medical supervision in concert with the patient's rehab needs make it unreasonable for this patient to be served in a less intensive setting? Yes Co-Morbidities requiring supervision/potential complications: Set take shock secondary to bacteremia, acute hypoxic respiratory failure, acute renal failure secondary to septic shock, metabolic encephalopathy, multifocal CVA, chronic anemia, A-fib, hyperlipidemia, hyperglycemia, history of AAA,  left hip prosthesis, hypertension, CAD Due to bladder management, bowel management, safety, skin/wound care, disease management, medication administration, pain management, and patient education, does the patient require 24 hr/day rehab nursing? Yes Does the patient require coordinated care of a physician, rehab nurse, PT, OT, and SLP to address physical and functional deficits in the context of the above medical diagnosis(es)? Yes Addressing deficits in the following areas: balance, endurance, locomotion, strength, transferring, bowel/bladder control, bathing, dressing, feeding, grooming, toileting, cognition, speech, language, and psychosocial support Can the patient actively participate in an intensive therapy program of at least 3 hrs of therapy 5 days a week? Yes The potential for patient to make measurable gains while on inpatient rehab is excellent Anticipated functional outcomes upon discharge from inpatient rehab: supervision PT, supervision OT, supervision SLP Estimated rehab length of stay to reach the above functional goals is: 10-14 Anticipated discharge destination: Home 10. Overall Rehab/Functional Prognosis: good   MD  Signature: Fanny Dance

## 2023-04-07 NOTE — Progress Notes (Signed)
ANTICOAGULATION CONSULT NOTE - Initial Consult  Pharmacy Consult for warfarin Indication: atrial fibrillation  No Known Allergies  Patient Measurements: Weight: 114.3 kg (251 lb 15.8 oz) Heparin Dosing Weight: 100 kg  Vital Signs: Temp: 98.4 F (36.9 C) (09/27 0017) Temp Source: Oral (09/27 0017) BP: 116/77 (09/27 0017) Pulse Rate: 93 (09/27 0017)  Labs: Recent Labs    04/05/23 0500 04/05/23 0524 04/06/23 0444 04/07/23 0240  HGB  --  8.5* 7.9* 7.6*  HCT  --  28.3* 26.5* 25.1*  PLT  --  212 199 225  LABPROT 16.5*  --  20.9* 21.7*  INR 1.3*  --  1.8* 1.9*  HEPARINUNFRC  --   --   --  <0.10*  CREATININE 0.70  --  0.71 0.73    Estimated Creatinine Clearance: 131 mL/min (by C-G formula based on SCr of 0.73 mg/dL).   Medical History: Past Medical History:  Diagnosis Date   Angina pectoris (HCC) Canadian classification 2 04/10/2020   Anxiety    Arthritis    Coronary artery disease coronary CT angio did not show any major issues, suspicion for distal disease 06/12/2020   Depression    DJD (degenerative joint disease)    Dyslipidemia 02/13/2020   Essential hypertension 02/13/2020   Precordial chest pain 02/13/2020    Medications:  Scheduled:    stroke: early stages of recovery book   Does not apply Once   amiodarone  400 mg Oral BID   aspirin EC  81 mg Oral Daily   atorvastatin  80 mg Oral Daily   bisacodyl  10 mg Oral Daily   Or   bisacodyl  10 mg Rectal Daily   Chlorhexidine Gluconate Cloth  6 each Topical Daily   ezetimibe  10 mg Oral Daily   feeding supplement  237 mL Oral TID BM   furosemide  40 mg Oral Daily   insulin aspart  2-6 Units Subcutaneous Q4H   insulin detemir  8 Units Subcutaneous Daily   metoprolol tartrate  12.5 mg Oral BID   mouth rinse  15 mL Mouth Rinse 4 times per day   pantoprazole (PROTONIX) IV  40 mg Intravenous Daily   sodium chloride flush  10-40 mL Intracatheter Q12H   sodium chloride flush  3 mL Intravenous Q12H   tamsulosin   0.4 mg Oral BID   Warfarin - Pharmacist Dosing Inpatient   Does not apply q1600   Infusions:   sodium chloride     sodium chloride Stopped (04/06/23 1048)   ampicillin (OMNIPEN) IV 2 g (04/06/23 2304)   cefTRIAXone (ROCEPHIN)  IV 2 g (04/06/23 2129)   heparin 1,600 Units/hr (04/06/23 2105)    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 Having recurrent Afib postop, pharmacy now consulted to start warfarin for Afib.   INR of 1.8 is subtherapeutic after warfarin 5 mg dose the last 2 days. Patient had 1 Ensure yesterday, unsure how much of his meals he is eating. No new DDIs identified, patient remains on amiodarone and ampicillin which may increase serum warfarin. CBC is stable.  Acute DVT noted in the left internal jugular and a superficial thrombosis of left upper arm.  Pt has been refusing SCD's.  Given subtherapeutic INR (1.8), heparin drip started.  9/27 AM: heparin level returned at <0.1 on 1600 units/hr. Per RN, no issues with the heparin  infusion running continuously or signs/symptoms of bleeding. Drawn appropriately. Hgb low, stable  Goal of Therapy:  INR 2-3 Heparin level 0.3-0.7 units/ml Monitor platelets by anticoagulation protocol: Yes  Plan: Continue warfarin as above Give heparin bolus 2000 units x1, then start heparin infusion at 1900 units/hr 6 hour heparin level Daily heparin level, INR, CBC Stop heparin bridge when INR >2 for 24h  Arabella Merles, PharmD. Clinical Pharmacist 04/07/2023 3:30 AM

## 2023-04-07 NOTE — Progress Notes (Signed)
Fanny Dance, MD  Physician Physical Medicine and Rehabilitation   PMR Pre-admission    Signed   Date of Service: 04/07/2023 10:50 AM  Related encounter: Admission (Current) from 03/28/2023 in [facility] 4E CV SURGICAL PROGRESSIVE CARE   Signed     Expand All Collapse All  PMR Admission Coordinator Pre-Admission Assessment   Patient: Dale Mills is an 57 y.o., male MRN: [id number] DOB: 07-03-1966 Height:   Weight: 110.6 kg (done by [employer])   [employer] HMO:     PPO:      PCP:      IPA:      80/20:      OTHER:  PRIMARY: uninsured      Policy#:       Subscriber:  CM Name:       Phone#:      Fax#:  Pre-Cert#:       Employer:  Benefits:  Phone #:      Name:  Eff. Date:      Deduct:       Out of Pocket Max:       Life Max:  CIR:       SNF:  Outpatient:      Co-Pay:  Home Health:       Co-Pay:  DME:      Co-Pay:  Providers:  SECONDARY:       Policy#:      Phone#:    Artist:       Phone#:    The Data processing manager" for patients in Inpatient Rehabilitation Facilities with attached "Privacy Act Statement-Health Care Records" was provided and verbally reviewed with: Patient and Family   Emergency Contact Information Contact Information       Name Relation Home Work Mobile    MORRIS,[name] Daughter     [phone]         Other Contacts       Name Relation Home Work Mobile    White,Amber Daughter     [phone]           Current Medical History  Patient 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  In ED he was noted ot have leukocytosis, AKI, and tachycardia.  He was started on fluids which led to flash pulmonary edema requiring intubation.  He was started on broad spectrum ABC and blood cultures showed enterococcus faecalis.   TTE obtained and showed mitral valve vegetation.  Echo showed normal biventricular function with concern for aortic dissection.  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  He required pressors for septic shock.  CTS consulted and on 9/20 he underwent aortic valve and root replacement per Dr. Leafy Ro.  OR culture showed gram positive cocci in pairs.  Post op course complicated by persistent encephalopathy.  Neurology consulted and was discovered pt had acute ischemic infarct, embolic shower in the R PCA territory.  MRI confirmed infact in R PCA territory as well as acute infarcts in bilateral parietal lobes, cerebellum, and small vlumve acute SAH along the right frontal convexity.  He was extubated on 9/21.  He was diuresed and started on lovenox for DVT prophylaxis.  He  is tolerating a regular diet.  He did require amio infusion for afib with rvr, currently on PO.  PICC line placed for long term IV abx treatment of his endocarditis per ID recommendations.  He did receive an MRI of his L hip prosthesis to confirm no infection and it was negative.  He did develop a LUE DVT on POD 7 and was started on heparin, which was discontinued on 9/27.  Therapy evaluations were completed and pt was recommended for CIR>    Patient's medical record from [facility] has been reviewed by the rehabilitation admission coordinator and physician.   Past Medical History      Past Medical History:  Diagnosis Date   Angina pectoris (HCC) Canadian classification 2 04/10/2020   Anxiety     Arthritis     Coronary artery disease coronary CT angio did not show any major issues, suspicion for distal disease 06/12/2020   Depression     DJD (degenerative joint disease)     Dyslipidemia 02/13/2020   Essential hypertension 02/13/2020   Precordial chest pain  02/13/2020          Has the patient had major surgery during 100 days prior to admission? Yes   Family History   family history includes CAD in his mother; Cancer in his child; Heart attack in his father and paternal grandfather; Kidney failure in his mother.   Current Medications  Current Medications    Current Facility-Administered Medications:     stroke: early stages of recovery book, , Does not apply, Once, Roddenberry, Myron G, PA-C   0.9 %  sodium chloride infusion, 250 mL, Intravenous, Continuous, Roddenberry, Myron G, PA-C   0.9 %  sodium chloride infusion, , Intravenous, PRN, Lorin Glass, MD, Stopped at 04/06/23 1048   amiodarone (PACERONE) tablet 400 mg, 400 mg, Oral, BID, Eugenio Hoes, MD, 400 mg at 04/07/23 0845   ampicillin (OMNIPEN) 2 g in sodium chloride 0.9 % 100 mL IVPB, 2 g, Intravenous, Q4H, Roddenberry, Myron G, PA-C, Last Rate: 300 mL/hr at 04/07/23 0607, 2 g at 04/07/23 0607   aspirin EC tablet 81 mg, 81 mg, Oral, Daily, Eugenio Hoes, MD, 81 mg at 04/07/23 0845   atorvastatin (LIPITOR) tablet 80 mg, 80 mg, Oral, Daily, Eugenio Hoes, MD, 80 mg at 04/07/23 0845   bisacodyl (DULCOLAX) EC tablet 10 mg, 10 mg, Oral, Daily, 10 mg at 04/06/23 0948 **OR** bisacodyl (DULCOLAX) suppository 10 mg, 10 mg, Rectal, Daily, Roddenberry, Myron G, PA-C, 10 mg at 04/01/23 1021   cefTRIAXone (ROCEPHIN) 2 g in sodium chloride 0.9 % 100 mL IVPB, 2 g, Intravenous, Q12H, Roddenberry, Myron G, PA-C, Last Rate: 200 mL/hr at 04/07/23 1011, 2 g at 04/07/23 1011   Chlorhexidine Gluconate Cloth 2 % PADS 6 each, 6 each, Topical, Daily, Cheri Fowler, MD, 6 each at 04/07/23 0845   ezetimibe (ZETIA) tablet 10 mg, 10 mg, Oral, Daily, Eugenio Hoes, MD, 10 mg at 04/07/23 0844   Fe Fum-Vit C-Vit B12-FA (TRIGELS-F FORTE) capsule 1 capsule, 1 capsule, Oral, QPC breakfast, Lovett Sox, MD, 1 capsule at 04/07/23 1017   feeding supplement (ENSURE ENLIVE / ENSURE PLUS) liquid 237 mL, 237 mL, Oral,  TID BM, Eugenio Hoes, MD, 237 mL at 04/07/23 0846   furosemide (LASIX) tablet 40 mg, 40 mg, Oral, Daily, Lorin Glass, MD, 40 mg at 04/07/23 0845   heparin ADULT infusion 100 units/mL (25000 units/248mL), 1,900 Units/hr, Intravenous, Continuous, Arabella Merles, [facility], Last Rate: 19 mL/hr at 04/07/23 0512,  1,900 Units/hr at 04/07/23 0512   insulin aspart (novoLOG) injection 2-6 Units, 2-6 Units, Subcutaneous, TID AC & HS, Lovett Sox, MD   insulin detemir (LEVEMIR) injection 8 Units, 8 Units, Subcutaneous, Daily, Lorin Glass, MD, 8 Units at 04/07/23 1013   metoprolol tartrate (LOPRESSOR) injection 2.5-5 mg, 2.5-5 mg, Intravenous, Q2H PRN, Roddenberry, Myron G, PA-C   metoprolol tartrate (LOPRESSOR) tablet 12.5 mg, 12.5 mg, Oral, BID, Eugenio Hoes, MD, 12.5 mg at 04/07/23 0844   morphine (PF) 2 MG/ML injection 1-4 mg, 1-4 mg, Intravenous, Q1H PRN, Roddenberry, Myron G, PA-C, 2 mg at 04/02/23 1934   ondansetron (ZOFRAN) injection 4 mg, 4 mg, Intravenous, Q6H PRN, Roddenberry, Myron G, PA-C, 4 mg at 04/02/23 9485   Oral care mouth rinse, 15 mL, Mouth Rinse, 4 times per day, Eugenio Hoes, MD, 15 mL at 04/07/23 0846   Oral care mouth rinse, 15 mL, Mouth Rinse, PRN, Eugenio Hoes, MD   oxyCODONE (Oxy IR/ROXICODONE) immediate release tablet 5-10 mg, 5-10 mg, Oral, Q3H PRN, Roddenberry, Myron G, PA-C, 10 mg at 04/04/23 1217   pantoprazole (PROTONIX) injection 40 mg, 40 mg, Intravenous, Daily, Silvana Newness, RPH, 40 mg at 04/07/23 0844   sodium chloride flush (NS) 0.9 % injection 10-40 mL, 10-40 mL, Intracatheter, Q12H, Eugenio Hoes, MD, 10 mL at 04/07/23 0847   sodium chloride flush (NS) 0.9 % injection 10-40 mL, 10-40 mL, Intracatheter, PRN, Eugenio Hoes, MD   sodium chloride flush (NS) 0.9 % injection 3 mL, 3 mL, Intravenous, Q12H, Roddenberry, Myron G, PA-C, 3 mL at 04/06/23 0957   sodium chloride flush (NS) 0.9 % injection 3 mL, 3 mL, Intravenous, PRN, Roddenberry, Myron G, PA-C    tamsulosin (FLOMAX) capsule 0.4 mg, 0.4 mg, Oral, BID, Roddenberry, Myron G, PA-C, 0.4 mg at 04/07/23 0845   traMADol (ULTRAM) tablet 50-100 mg, 50-100 mg, Oral, Q4H PRN, Roddenberry, Myron G, PA-C   traZODone (DESYREL) tablet 50 mg, 50 mg, Oral, QHS PRN, Eugenio Hoes, MD, 50 mg at 04/06/23 2306   Warfarin - Pharmacist Dosing Inpatient, , Does not apply, q1600, [facility], Given at 04/06/23 1716     Patients Current Diet:  Diet Order                  Diet regular Room service appropriate? Yes with Assist; Fluid consistency: Thin  Diet effective now                         Precautions / Restrictions Precautions Precautions: Sternal, Fall Precaution Booklet Issued: No Precaution Comments: pt able to recall precautions however requires constant cues to adhere to them functionally Restrictions Weight Bearing Restrictions: Yes (sternal precaution) RUE Weight Bearing: Non weight bearing LUE Weight Bearing: Non weight bearing Other Position/Activity Restrictions: chest tube    Has the patient had 2 or more falls or a fall with injury in the past year? No   Prior Activity Level Community (5-7x/wk): indep, not working full time, no DME used, driving   Prior Functional Level Self Care: Did the patient need help bathing, dressing, using the toilet or eating? Independent   Indoor Mobility: Did the patient need assistance with walking from room to room (with or without device)? Independent   Stairs: Did the patient need assistance with internal or external stairs (with or without device)? Independent   Functional Cognition: Did the patient need help planning regular tasks such as shopping or remembering to take medications? Independent  Patient Information Are you of Hispanic, Latino/a,or Spanish origin?: A. No, not of Hispanic, Latino/a, or Spanish origin What is your race?: A. White Do you need or want an interpreter to communicate with a doctor or health care  staff?: 0. No   Patient's Response To:  Health Literacy and Transportation Is the patient able to respond to health literacy and transportation needs?: Yes Health Literacy - How often do you need to have someone help you when you read instructions, pamphlets, or other written material from your doctor or pharmacy?: Never In the past 12 months, has lack of transportation kept you from medical appointments or from getting medications?: No In the past 12 months, has lack of transportation kept you from meetings, work, or from getting things needed for daily living?: No   Journalist, newspaper / Equipment Home Assistive Devices/Equipment: None   Prior Device Use: Indicate devices/aids used by the patient prior to current illness, exacerbation or injury? None of the above   Current Functional Level Cognition   Arousal/Alertness: Awake/alert Overall Cognitive Status: Impaired/Different from baseline Current Attention Level: Sustained, Selective Orientation Level: Oriented X4 Following Commands: Follows one step commands with increased time Safety/Judgement: Decreased awareness of deficits General Comments: requires increased time to process, but incremental improvement.  Pt feeling that it is all need to wake up. Attention: Sustained Sustained Attention: Appears intact Memory: Impaired Memory Impairment: Retrieval deficit (3/5 words) Awareness: Impaired Awareness Impairment: Intellectual impairment, Emergent impairment Problem Solving: Impaired Safety/Judgment: Impaired    Extremity Assessment (includes Sensation/Coordination)   Upper Extremity Assessment: Generalized weakness RUE Deficits / Details: global weakness, but WFL for basic ROM within limits of sternal prec RUE Coordination: decreased fine motor LUE Deficits / Details: global weakness compared to RUE, decr coordination during functional reach task LUE Coordination: decreased fine motor  Lower Extremity Assessment: Defer  to PT evaluation     ADLs   Overall ADL's : Needs assistance/impaired Eating/Feeding: Set up, Sitting Eating/Feeding Details (indicate cue type and reason): drinking water Grooming: Minimal assistance, Sitting Grooming Details (indicate cue type and reason): cues for sequencing Upper Body Bathing: Moderate assistance Lower Body Bathing: Maximal assistance Upper Body Dressing : Minimal assistance, Sitting Upper Body Dressing Details (indicate cue type and reason): donning gown Lower Body Dressing: Maximal assistance, Sit to/from stand Toilet Transfer: Moderate assistance, Rolling walker (2 wheels), Regular Toilet Toilet Transfer Details (indicate cue type and reason): with Programmer, applications- Clothing Manipulation and Hygiene: Sit to/from stand, Maximal assistance Functional mobility during ADLs: Moderate assistance, +2 for safety/equipment     Mobility   Overal bed mobility: Needs Assistance Bed Mobility: Sit to Supine Sidelying to sit: Min assist Supine to sit: Mod assist, +2 for physical assistance Sit to sidelying: Mod assist General bed mobility comments: pt maneuvered LE's off bed.  Assisted trunk up and forward.  LE assist to supine.  Pt able to scoot to EOB without assist.     Transfers   Overall transfer level: Needs assistance Equipment used:  (eva walker) Transfers: Sit to/from Stand Sit to Stand: Mod assist Bed to/from chair/wheelchair/BSC transfer type:: Step pivot Step pivot transfers: Mod assist General transfer comment: light mod assist for coming forward more than boost, use of heart pillow to reinforce no use of UE's     Ambulation / Gait / Stairs / Wheelchair Mobility   Ambulation/Gait Ambulation/Gait assistance: Min assist, Mod assist Gait Distance (Feet): 160 Feet Assistive device: Rolling walker (2 wheels) Gait Pattern/deviations: Step-through pattern, Decreased stride length, Narrow  base of support, Drifts right/left, Scissoring General Gait Details:  mild unsteadiness overall with some degradation as his involved side fatigued.  Mod assist generally with short episodes of min assist. Gait velocity: dec Gait velocity interpretation: <1.31 ft/sec, indicative of household ambulator     Posture / Balance Dynamic Sitting Balance Sitting balance - Comments: sits statically, tends to lean posteriorly but needs incr cues to bring chest forward, difficult to maintain Balance Overall balance assessment: Needs assistance Sitting-balance support: Feet supported Sitting balance-Leahy Scale: Fair Sitting balance - Comments: sits statically, tends to lean posteriorly but needs incr cues to bring chest forward, difficult to maintain Standing balance support: Reliant on assistive device for balance Standing balance-Leahy Scale: Poor Standing balance comment: dependent on external support     Special needs/care consideration Continuous Drip IV  ampicillin and ceftriaxone, Skin sternal incision, and Diabetic management yes    Previous Home Environment (from acute therapy documentation) Living Arrangements: Alone Available Help at Discharge: Family Type of Home: House Home Layout: One level Home Access: Ramped entrance Home Care Services: No Additional Comments: plans to go to daughter's home, reports is handicapped accessable   Discharge Living Setting Plans for Discharge Living Setting: Other (Comment) (ALF versus home with daughter) Type of Home at Discharge: House Discharge Home Layout: One level Discharge Home Access: Ramped entrance Discharge Bathroom Shower/Tub: Tub/shower unit Discharge Bathroom Toilet: Standard Discharge Bathroom Accessibility: Yes How Accessible: Accessible via walker Does the patient have any problems obtaining your medications?: No   Social/Family/Support Systems Anticipated Caregiver: daughters Hospital doctor and [facility] Anticipated Industrial/product designer Information: Hospital doctor [phone]; Chrissy  [phone] Ability/Limitations of Caregiver: min assist Caregiver Availability: 24/7 Discharge Plan Discussed with Primary Caregiver: Yes Is Caregiver In Agreement with Plan?: Yes Does Caregiver/Family have Issues with Lodging/Transportation while Pt is in Rehab?: No   Goals Patient/Family Goal for Rehab: PT/OT/SLP supervision Expected length of stay: 10-14 days Additional Information: Discharge plan: ideally will discharge directly to an ALF, however if that is not possible can d/c home with Amber until they are able to place him. Pt/Family Agrees to Admission and willing to participate: Yes Program Orientation Provided & Reviewed with Pt/Caregiver Including Roles  & Responsibilities: Yes Additional Information Needs: medicaid application pending  Barriers to Discharge: Insurance for SNF coverage, Decreased caregiver support   Decrease burden of Care through IP rehab admission: n/a   Possible need for SNF placement upon discharge:  Not anticipated. Plan for discharge directly to ALF versus home with daughter [name] if bed not available at time of discharge.     Patient Condition: I have reviewed medical records from [facility], spoken with  [facility] , and patient and daughter. I met with patient at the bedside and discussed via phone for inpatient rehabilitation assessment.  Patient will benefit from ongoing PT, OT, and SLP, can actively participate in 3 hours of therapy a day 5 days of the week, and can make measurable gains during the admission.  Patient will also benefit from the coordinated team approach during an Inpatient Acute Rehabilitation admission.  The patient will receive intensive therapy as well as Rehabilitation physician, nursing, social worker, and care management interventions.  Due to bladder management, bowel management, safety, skin/wound care, disease management, medication administration, pain management, and patient education the patient requires 24 hour a day rehabilitation  nursing.  The patient is currently min assist to mod assist with mobility and basic ADLs.  Discharge setting and therapy post discharge at  home  is anticipated.  Patient  has agreed to participate in the Acute Inpatient Rehabilitation Program and will admit today.   Preadmission Screen Completed By:  Stephania Fragmin, PT, DPT  04/07/2023 11:06 AM ______________________________________________________________________   Discussed status with Dr. Natale Lay on 04/07/23  at 11:06 AM  and received approval for admission today.   Admission Coordinator:  Stephania Fragmin, PT, DPT time 11:06 AM Dorna Bloom 04/07/23     Assessment/Plan: Diagnosis:Cardiac debility status post MVR and aortic root replacement 03/31/2023 Does the need for close, 24 hr/day Medical supervision in concert with the patient's rehab needs make it unreasonable for this patient to be served in a less intensive setting? Yes Co-Morbidities requiring supervision/potential complications: Set take shock secondary to bacteremia, acute hypoxic respiratory failure, acute renal failure secondary to septic shock, metabolic encephalopathy, multifocal CVA, chronic anemia, A-fib, hyperlipidemia, hyperglycemia, history of AAA, left hip prosthesis, hypertension, CAD Due to bladder management, bowel management, safety, skin/wound care, disease management, medication administration, pain management, and patient education, does the patient require 24 hr/day rehab nursing? Yes Does the patient require coordinated care of a physician, rehab nurse, PT, OT, and SLP to address physical and functional deficits in the context of the above medical diagnosis(es)? Yes Addressing deficits in the following areas: balance, endurance, locomotion, strength, transferring, bowel/bladder control, bathing, dressing, feeding, grooming, toileting, cognition, speech, language, and psychosocial support Can the patient actively participate in an intensive therapy program of at least 3  hrs of therapy 5 days a week? Yes The potential for patient to make measurable gains while on inpatient rehab is excellent Anticipated functional outcomes upon discharge from inpatient rehab: supervision PT, supervision OT, supervision SLP Estimated rehab length of stay to reach the above functional goals is: 10-14 Anticipated discharge destination: Home 10. Overall Rehab/Functional Prognosis: good     MD Signature: Fanny Dance         Revision History

## 2023-04-07 NOTE — Progress Notes (Signed)
Inpatient Rehab Admissions Coordinator:   I have a bed available for this patient to admit to CIR today.  TCS team in agreement.  Will let pt/family know and make TOC aware.   Estill Dooms, PT, DPT Admissions Coordinator [phone] 04/07/23  10:42 AM

## 2023-04-07 NOTE — Progress Notes (Signed)
The patient is receiving Protonix by the intravenous route.  Based on criteria approved by the Pharmacy and Therapeutics Committee and the Medical Executive Committee, the medication is being converted to the equivalent oral dose form.  These criteria include: -No active GI bleeding -Able to tolerate diet of full liquids (or better) or tube feeding -Able to tolerate other medications by the oral or enteral route  If you have any questions about this conversion, please contact the Pharmacy Department (phone [phone]).  Thank you.  Cedric Fishman, PharmD, BCPS, BCCCP Clinical Pharmacist

## 2023-04-07 NOTE — Plan of Care (Signed)
  Problem: Clinical Measurements: Goal: Will remain free from infection Outcome: Progressing Goal: Respiratory complications will improve 04/07/2023 0612 by Jill Side, RN Outcome: Progressing 04/07/2023 0130 by Jill Side, RN Outcome: Progressing Goal: Cardiovascular complication will be avoided Outcome: Progressing   Problem: Nutrition: Goal: Adequate nutrition will be maintained Outcome: Progressing

## 2023-04-07 NOTE — Plan of Care (Signed)
  Problem: Clinical Measurements: Goal: Will remain free from infection Outcome: Progressing Goal: Respiratory complications will improve Outcome: Progressing Goal: Cardiovascular complication will be avoided Outcome: Progressing   

## 2023-04-07 NOTE — Progress Notes (Signed)
Discussed CRP2, will refer to [facility].  [phone]  Faustino Congress 04/07/2023 11:14 AM

## 2023-04-07 NOTE — H&P (Shared)
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 He was transferred to [facility] for management. He was started on IV antibiotics and pressors. TEE by Dr. 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 Family reported that he had not been himself  since hip surgery, was anemic and was having issues with N/V few times a day. TCTS consulted for input on surgical intervention and patient monitored till medically stable.  He has bouts of agitation w/lethargy and MRI/MRA brain done revealing confluent R-PCA occipital infarct with acute smaller infarcts in bilateral parietal lobes and in cerebellum, small volume SAH right frontal convexity question due to septic emboli.  Dr. Renold Don consulted for input and recommended hip imaging to rule out joint infection due to ongoing pain and swelling. Due to concerns of further decompensation, he was taken to OR for MVR, AVR and ascending aortic root replacement by Dr. Leafy Ro on 03/31/23. He tolerated extubation without difficulty and post op A fib treated with Amiodarone. Fluid overload managed with diuresis. He was cleared to start regular diet and PICC placed on 09/25    .   MRI hip 09/24 was negative for septic arthritis and edema left gluteus and proximal quad musculature felt to be infectious v/s inflammatory with SQ edema laterally in pelvis and proximal thighs bilaterally. Dr. Drue Second recommends 6 weeks IV ampicillin plus ceftriaxone with EOT 11/1 and follow up on  outpatient basis for likely extension with chronic oral antibiotics.  Coumadin added on 09/25 due to persistent A fib. Lethargy has resolved. LUE dopplered today due to edema and was positive for L-internal jugular DVT as well as superficial left cephalic DVT.  PT/OT/ST hs been working with patient who was found to have cognitive deficits with left inattention, delay in processing, weakness and balance deficits with wide BOS when walking. CIR recommended due to functional decline.    Review of Systems  Constitutional:  Negative for chills and fever.  Eyes:  Positive for blurred vision (has difficulty seeing foot).  Respiratory:  Positive for cough and shortness of breath.   Cardiovascular:  Positive for chest pain.  Gastrointestinal:  Positive for diarrhea. Negative for vomiting.  Neurological:  Positive for dizziness, speech change, focal weakness (LUE hard to use) and weakness.     Past Medical History:  Diagnosis Date   Angina pectoris (HCC) Canadian classification 2 04/10/2020   Anxiety    Arthritis    Coronary artery disease coronary CT angio did not show any major issues, suspicion for distal disease 06/12/2020   Depression    DJD (degenerative joint disease)    Dyslipidemia 02/13/2020   Essential hypertension 02/13/2020   Precordial chest pain 02/13/2020    Past Surgical History:  Procedure Laterality Date   ASCENDING AORTIC ROOT REPLACEMENT N/A 03/31/2023   Procedure: ASCENDING AORTIC ROOT REPLACEMENT UTILIZING 27 KONECT RESILIA  AORTIC VALVE CONDUIT;  Surgeon: Eugenio Hoes, MD;  Location: [facility] OR;  Service: Open Heart Surgery;  Laterality: N/A;   COLONOSCOPY W/ POLYPECTOMY  FOREARM SURGERY     MASS REMOVED   HIP ARTHROSCOPY Left    LASIK Bilateral    MITRAL VALVE REPLACEMENT N/A 03/31/2023   Procedure: MITRAL VALVE (MV) REPLACEMENT UTILIZING SIZE 33 MOSAIC PORCINE HEART VALVE;  Surgeon: Eugenio Hoes, MD;  Location: [facility] OR;  Service: Open Heart Surgery;  Laterality: N/A;    TEE WITHOUT CARDIOVERSION N/A 03/31/2023   Procedure: TRANSESOPHAGEAL ECHOCARDIOGRAM;  Surgeon: Eugenio Hoes, MD;  Location: [facility] OR;  Service: Open Heart Surgery;  Laterality: N/A;   TONSILECTOMY, ADENOIDECTOMY, BILATERAL MYRINGOTOMY AND TUBES  2011   UMBILICAL HERNIA REPAIR     VASECTOMY      Family History  Problem Relation Age of Onset   CAD Mother    Kidney failure Mother    Heart attack Father    Heart attack Paternal Grandfather    Cancer Child        RARE BONE CANCER RESULTING IN TOTAL AMPUTATION OF LIMB    Social History:  Lives alone and was working at [employer]. He reports that he has never smoked. He has never used smokeless tobacco. He reports that he does not drink alcohol and does not use drugs.   Allergies: No Known Allergies   Medications Prior to Admission  Medication Sig Dispense Refill   aspirin EC 325 MG tablet Take 325 mg by mouth daily.     atenolol (TENORMIN) 25 MG tablet Take 12.5 mg by mouth daily.     atorvastatin (LIPITOR) 80 MG tablet Take 1 tablet (80 mg total) by mouth daily. 90 tablet 3   celecoxib (CELEBREX) 200 MG capsule Take 200 mg by mouth 2 (two) times daily.     diclofenac (VOLTAREN) 75 MG EC tablet Take 75 mg by mouth 2 (two) times daily.     nitroGLYCERIN (NITROSTAT) 0.4 MG SL tablet Place 1 tablet (0.4 mg total) under the tongue every 5 (five) minutes as needed for chest pain. 25 tablet 11   ondansetron (ZOFRAN-ODT) 4 MG disintegrating tablet Take 4 mg by mouth 3 (three) times daily as needed.     pantoprazole (PROTONIX) 40 MG tablet Take 40 mg by mouth daily.     tamsulosin (FLOMAX) 0.4 MG CAPS capsule Take 0.4 mg by mouth 2 (two) times daily.     ranolazine (RANEXA) 1000 MG SR tablet Take 1 tablet by mouth once daily 90 tablet 3    Home: Home Living Family/patient expects to be discharged to:: Private residence Living Arrangements: Alone Available Help at Discharge: Family Type of Home: House Home Access: Ramped  entrance Home Layout: One level Additional Comments: plans to go to daughter's home, reports is handicapped accessable   Functional History: Prior Function Prior Level of Function : Independent/Modified Independent, Driving Mobility Comments: no AD ADLs Comments: ind, was not working  Functional Status:  Mobility: Bed Mobility Overal bed mobility: Needs Assistance Bed Mobility: Sit to Supine Sidelying to sit: Min assist Supine to sit: Mod assist, +2 for physical assistance Sit to sidelying: Mod assist General bed mobility comments: pt maneuvered LE's off bed.  Assisted trunk up and forward.  LE assist to supine.  Pt able to scoot to EOB without assist. Transfers Overall transfer level: Needs assistance Equipment used:  (eva walker) Transfers: Sit to/from Stand Sit to Stand: Mod assist Bed to/from chair/wheelchair/BSC transfer type:: Step pivot Step pivot transfers: Mod assist General transfer comment: light mod assist for coming forward more than boost, use of heart pillow to reinforce no use of UE's  Ambulation/Gait Ambulation/Gait assistance: Min assist, Mod assist Gait Distance (Feet): 160 Feet Assistive device: Rolling walker (2 wheels) Gait Pattern/deviations: Step-through pattern, Decreased stride length, Narrow base of support, Drifts right/left, Scissoring General Gait Details: mild unsteadiness overall with some degradation as his involved side fatigued.  Mod assist generally with short episodes of min assist. Gait velocity: dec Gait velocity interpretation: <1.31 ft/sec, indicative of household ambulator    ADL: ADL Overall ADL's : Needs assistance/impaired Eating/Feeding: Set up, Sitting Eating/Feeding Details (indicate cue type and reason): drinking water Grooming: Minimal assistance, Sitting Grooming Details (indicate cue type and reason): cues for sequencing Upper Body Bathing: Moderate assistance Lower Body Bathing: Maximal assistance Upper Body Dressing :  Minimal assistance, Sitting Upper Body Dressing Details (indicate cue type and reason): donning gown Lower Body Dressing: Maximal assistance, Sit to/from stand Toilet Transfer: Moderate assistance, Rolling walker (2 wheels), Regular Toilet Toilet Transfer Details (indicate cue type and reason): with Programmer, applications- Clothing Manipulation and Hygiene: Sit to/from stand, Maximal assistance Functional mobility during ADLs: Moderate assistance, +2 for safety/equipment  Cognition: Cognition Overall Cognitive Status: Impaired/Different from baseline Arousal/Alertness: Awake/alert Orientation Level: Oriented X4 Year: 2024 Day of Week: Correct Attention: Sustained Sustained Attention: Appears intact Memory: Impaired Memory Impairment: Retrieval deficit (3/5 words) Awareness: Impaired Awareness Impairment: Intellectual impairment, Emergent impairment Problem Solving: Impaired Safety/Judgment: Impaired Cognition Arousal: Alert Behavior During Therapy: WFL for tasks assessed/performed Overall Cognitive Status: Impaired/Different from baseline Area of Impairment: Attention, Memory, Following commands, Safety/judgement, Awareness, Problem solving, Orientation Orientation Level: Time Current Attention Level: Sustained, Selective Memory: Decreased short-term memory, Decreased recall of precautions Following Commands: Follows one step commands with increased time Safety/Judgement: Decreased awareness of deficits Awareness: Intellectual Problem Solving: Slow processing, Requires verbal cues, Difficulty sequencing, Decreased initiation General Comments: requires increased time to process, but incremental improvement.  Pt feeling that it is all need to wake up.  Physical Exam: Blood pressure 122/78, pulse 100, temperature 99.7 F (37.6 C), temperature source Oral, resp. rate 20, weight 110.6 kg, SpO2 95%. Physical Exam Vitals and nursing note reviewed.  Constitutional:      Appearance:  Normal appearance.  HENT:     Mouth/Throat:     Mouth: Mucous membranes are dry.  Pulmonary:     Comments: Increased WOB with conversation. Intermittent cough noted.  Chest:     Chest wall: Tenderness present.  Abdominal:     Palpations: Abdomen is soft.     Comments: Ecchymosis left lower abdomen  Musculoskeletal:        General: Swelling (2+ edema BLE distally) present.  Skin:    General: Skin is warm and dry.  Neurological:     General: No focal deficit present.     Mental Status: He is alert and oriented to person, place, and time.     Comments: Dysarthric speech. Blurry vision --new. He was able to answer date, city, state, [place], DOB. Is aware that he's had bouts of disorientation. He was able to follow simple 1 and 2 step motor commands but felt asleep by end of exam.     Results for orders placed or performed during the hospital encounter of 03/28/23 (from the past 48 hour(s))  Glucose, capillary     Status: None   Collection Time: 04/05/23  3:55 PM  Result Value Ref Range   Glucose-Capillary 98 70 - 99 mg/dL    Comment: Glucose reference range applies only to samples taken after fasting for at least 8 hours.  Glucose, capillary  Status: Abnormal   Collection Time: 04/05/23  7:35 PM  Result Value Ref Range   Glucose-Capillary 120 (H) 70 - 99 mg/dL    Comment: Glucose reference range applies only to samples taken after fasting for at least 8 hours.  Glucose, capillary     Status: Abnormal   Collection Time: 04/05/23  7:55 PM  Result Value Ref Range   Glucose-Capillary 123 (H) 70 - 99 mg/dL    Comment: Glucose reference range applies only to samples taken after fasting for at least 8 hours.  Glucose, capillary     Status: Abnormal   Collection Time: 04/05/23 11:00 PM  Result Value Ref Range   Glucose-Capillary 132 (H) 70 - 99 mg/dL    Comment: Glucose reference range applies only to samples taken after fasting for at least 8 hours.  Protime-INR     Status: Abnormal    Collection Time: 04/06/23  4:44 AM  Result Value Ref Range   Prothrombin Time 20.9 (H) 11.4 - 15.2 seconds   INR 1.8 (H) 0.8 - 1.2    Comment: (NOTE) INR goal varies based on device and disease states. Performed at [facility], [address], [place], [place] [address]   CBC     Status: Abnormal   Collection Time: 04/06/23  4:44 AM  Result Value Ref Range   WBC 10.3 4.0 - 10.5 K/uL   RBC 2.91 (L) 4.22 - 5.81 MIL/uL   Hemoglobin 7.9 (L) 13.0 - 17.0 g/dL   HCT 60.4 (L) 54.0 - 98.1 %   MCV 91.1 80.0 - 100.0 fL   MCH 27.1 26.0 - 34.0 pg   MCHC 29.8 (L) 30.0 - 36.0 g/dL   RDW 19.1 (H) 47.8 - 29.5 %   Platelets 199 150 - 400 K/uL   nRBC 0.0 0.0 - 0.2 %    Comment: Performed at [facility], [address] [place], [place] [address]  Basic metabolic panel     Status: Abnormal   Collection Time: 04/06/23  4:44 AM  Result Value Ref Range   Sodium 140 135 - 145 mmol/L   Potassium 3.4 (L) 3.5 - 5.1 mmol/L   Chloride 103 98 - 111 mmol/L   CO2 30 22 - 32 mmol/L   Glucose, Bld 91 70 - 99 mg/dL    Comment: Glucose reference range applies only to samples taken after fasting for at least 8 hours.   BUN 12 6 - 20 mg/dL   Creatinine, Ser 8.65 0.61 - 1.24 mg/dL   Calcium 8.6 (L) 8.9 - 10.3 mg/dL   GFR, Estimated >78 >46 mL/min    Comment: (NOTE) Calculated using the CKD-EPI Creatinine Equation (2021)    Anion gap 7 5 - 15    Comment: Performed at [facility], [address] [place], [place] [address]  Glucose, capillary     Status: None   Collection Time: 04/06/23  4:48 AM  Result Value Ref Range   Glucose-Capillary 89 70 - 99 mg/dL    Comment: Glucose reference range applies only to samples taken after fasting for at least 8 hours.  Glucose, capillary     Status: Abnormal   Collection Time: 04/06/23  7:50 AM  Result Value Ref Range   Glucose-Capillary 145 (H) 70 - 99 mg/dL    Comment: Glucose reference range applies only to samples taken after fasting for at  least 8 hours.  Glucose, capillary     Status: Abnormal   Collection  Time: 04/06/23 12:09 PM  Result Value Ref Range   Glucose-Capillary 147 (H) 70 - 99 mg/dL    Comment: Glucose reference range applies only to samples taken after fasting for at least 8 hours.  Glucose, capillary     Status: Abnormal   Collection Time: 04/06/23  4:53 PM  Result Value Ref Range   Glucose-Capillary 111 (H) 70 - 99 mg/dL    Comment: Glucose reference range applies only to samples taken after fasting for at least 8 hours.  Glucose, capillary     Status: Abnormal   Collection Time: 04/06/23  8:00 PM  Result Value Ref Range   Glucose-Capillary 146 (H) 70 - 99 mg/dL    Comment: Glucose reference range applies only to samples taken after fasting for at least 8 hours.   Comment 1 Notify RN    Comment 2 Document in Chart   Glucose, capillary     Status: Abnormal   Collection Time: 04/07/23 12:15 AM  Result Value Ref Range   Glucose-Capillary 110 (H) 70 - 99 mg/dL    Comment: Glucose reference range applies only to samples taken after fasting for at least 8 hours.   Comment 1 Notify RN    Comment 2 Document in Chart   Protime-INR     Status: Abnormal   Collection Time: 04/07/23  2:40 AM  Result Value Ref Range   Prothrombin Time 21.7 (H) 11.4 - 15.2 seconds   INR 1.9 (H) 0.8 - 1.2    Comment: (NOTE) INR goal varies based on device and disease states. Performed at [facility], [address], [place], [place] [address]   Basic metabolic panel     Status: Abnormal   Collection Time: 04/07/23  2:40 AM  Result Value Ref Range   Sodium 139 135 - 145 mmol/L   Potassium 3.9 3.5 - 5.1 mmol/L   Chloride 103 98 - 111 mmol/L   CO2 27 22 - 32 mmol/L   Glucose, Bld 114 (H) 70 - 99 mg/dL    Comment: Glucose reference range applies only to samples taken after fasting for at least 8 hours.   BUN 13 6 - 20 mg/dL   Creatinine, Ser 6.04 0.61 - 1.24 mg/dL   Calcium 8.4 (L) 8.9 - 10.3 mg/dL   GFR, Estimated >54  >09 mL/min    Comment: (NOTE) Calculated using the CKD-EPI Creatinine Equation (2021)    Anion gap 9 5 - 15    Comment: Performed at [facility], [address] [place], [place] [address]  CBC     Status: Abnormal   Collection Time: 04/07/23  2:40 AM  Result Value Ref Range   WBC 11.2 (H) 4.0 - 10.5 K/uL   RBC 2.74 (L) 4.22 - 5.81 MIL/uL   Hemoglobin 7.6 (L) 13.0 - 17.0 g/dL   HCT 47.8 (L) 29.5 - 62.1 %   MCV 91.6 80.0 - 100.0 fL   MCH 27.7 26.0 - 34.0 pg   MCHC 30.3 30.0 - 36.0 g/dL   RDW 30.8 (H) 65.7 - 84.6 %   Platelets 225 150 - 400 K/uL   nRBC 0.0 0.0 - 0.2 %    Comment: Performed at [name], [address] [place], [place] [address]  Heparin level (unfractionated)     Status: Abnormal   Collection Time: 04/07/23  2:40 AM  Result Value Ref Range   Heparin Unfractionated <0.10 (L) 0.30 - 0.70 IU/mL    Comment: (NOTE)  The clinical reportable range upper limit is being lowered to >1.10 to align with the FDA approved guidance for the current laboratory assay.  If heparin results are below expected values, and patient dosage has  been confirmed, suggest follow up testing of antithrombin III levels. Performed at [name], [address] [place], [place] [address]   Glucose, capillary     Status: None   Collection Time: 04/07/23  4:14 AM  Result Value Ref Range   Glucose-Capillary 98 70 - 99 mg/dL    Comment: Glucose reference range applies only to samples taken after fasting for at least 8 hours.  Glucose, capillary     Status: Abnormal   Collection Time: 04/07/23  8:02 AM  Result Value Ref Range   Glucose-Capillary 129 (H) 70 - 99 mg/dL    Comment: Glucose reference range applies only to samples taken after fasting for at least 8 hours.   Comment 1 Notify RN    Comment 2 Document in Chart   Heparin level (unfractionated)     Status: Abnormal   Collection Time: 04/07/23 10:03 AM  Result Value Ref Range   Heparin Unfractionated <0.10 (L)  0.30 - 0.70 IU/mL    Comment: (NOTE) The clinical reportable range upper limit is being lowered to >1.10 to align with the FDA approved guidance for the current laboratory assay.  If heparin results are below expected values, and patient dosage has  been confirmed, suggest follow up testing of antithrombin III levels. Performed at [facility], [address] [place], [place] [address]   Glucose, capillary     Status: Abnormal   Collection Time: 04/07/23 12:53 PM  Result Value Ref Range   Glucose-Capillary 119 (H) 70 - 99 mg/dL    Comment: Glucose reference range applies only to samples taken after fasting for at least 8 hours.   Comment 1 Notify RN    Comment 2 Document in Chart    VAS [place] UPPER EXTREMITY VENOUS DUPLEX  Result Date: 04/06/2023 UPPER VENOUS STUDY  Patient Name:  HUDSON MAJKOWSKI Straw  Date of Exam:   04/06/2023 Medical Rec #: [id number]       Accession #:    [id number] Date of Birth: [birth year]       Patient Gender: M Patient Age:   46 years Exam Location:  [facility] Procedure:      VAS [place] UPPER EXTREMITY VENOUS DUPLEX Referring Phys: Joette Catching RODDENBERRY --------------------------------------------------------------------------------  Indications: Swelling Other Indications: Hx of central line placement and piv to LUE. Comparison Study: No previous exams Performing Technologist: Jody Hill RVT, RDMS  Examination Guidelines: A complete evaluation includes B-mode imaging, spectral Doppler, color Doppler, and power Doppler as needed of all accessible portions of each vessel. Bilateral testing is considered an integral part of a complete examination. Limited examinations for reoccurring indications may be performed as noted.  Right Findings: +----------+------------+---------+-----------+----------+--------------------+ RIGHT     CompressiblePhasicitySpontaneousProperties      Summary        +----------+------------+---------+-----------+----------+--------------------+  Subclavian               Yes       Yes                   patent by  color/doppler     +----------+------------+---------+-----------+----------+--------------------+  Left Findings: +----------+------------+---------+-----------+----------+-------+ LEFT      CompressiblePhasicitySpontaneousPropertiesSummary +----------+------------+---------+-----------+----------+-------+ IJV           None       No        No                Acute  +----------+------------+---------+-----------+----------+-------+ Subclavian    Full       Yes       Yes                      +----------+------------+---------+-----------+----------+-------+ Axillary      Full       Yes       Yes                      +----------+------------+---------+-----------+----------+-------+ Brachial      Full       Yes       Yes                      +----------+------------+---------+-----------+----------+-------+ Radial        Full                                          +----------+------------+---------+-----------+----------+-------+ Ulnar         Full                                          +----------+------------+---------+-----------+----------+-------+ Cephalic    Partial      No        No                Acute  +----------+------------+---------+-----------+----------+-------+ Basilic       Full       Yes       Yes                      +----------+------------+---------+-----------+----------+-------+  Summary:  Right: No evidence of thrombosis in the subclavian.  Left: Findings consistent with acute deep vein thrombosis involving the left internal jugular vein. Findings consistent with acute superficial vein thrombosis involving the left cephalic vein.  *See table(s) above for measurements and observations.  Diagnosing physician: Heath Lark Electronically signed by Heath Lark on 04/06/2023 at 5:08:09 PM.     Final       Blood pressure 122/78, pulse 100, temperature 99.7 F (37.6 C), temperature source Oral, resp. rate 20, weight 110.6 kg, SpO2 95%.  Medical Problem List and Plan: 1. Functional deficits secondary to ***  -patient may *** shower  -ELOS/Goals: *** 2.  L-internal jugular DVT/ A fib/Antithrombotics: -DVT/anticoagulation:  Pharmaceutical: Coumadin and Heparin bridge to change to lovenox at discharge  -antiplatelet therapy: ASA 3. Pain Management:  oxycodone or tramadol prn for pain.  4. Mood/Behavior/Sleep: LCSW to follow for evaluation and support.   -antipsychotic agents: N/A  --melatonin prn for insomnia.  5. Neuropsych/cognition: This patient is capable of making decisions on his own behalf. 6. Skin/Wound Care: Routine pressure relief measures.  7. Fluids/Electrolytes/Nutrition: Strict I/O. Check CMET in am.  8. Sepsis due to E faecalis bacteremia/endocarditis: On ampicillin and Ceftriaxone X 6 weeks  (reset 09/20) with EOT   --Leucocytosis improving from 18-->35.6-->10.-->11.2 9. Fluid overload/SOB:  Lasix 40 mg decreased to daily on 09/25 --developed cough last nigh with SOB 09/27 pm.  --daily weights with monitoring for signs of overload. .  10. A fib: Continue on amiodarone 400 mg bid since 09/25-->taper? 11. Pre diabetes/Stress induced hyperglycemia: Monitor BS ac/hs.  --Hgb A1C 6.2 (prediabetic). --diet changed to CM from regular. Continue Levemir 8 units daily with SSI prn for tighter BS control. .  --Transition insulin to metformin as intake improves.   12 ABLA:  Has had slow drift down in Hgb to 7.6. Monitor for signs of bleeding  --transfuse prn <7.0 or if symptomatic.  13. Constipation: Resolved and now w/diarrhea. Will hold laxative.     ***  Jacquelynn Cree, PA-C 04/07/2023

## 2023-04-07 NOTE — Progress Notes (Addendum)
[address]       [place] [address]             [phone]      7 Days Post-Op Procedure(s) (LRB): MITRAL VALVE (MV) REPLACEMENT UTILIZING SIZE 33 MOSAIC PORCINE HEART VALVE (N/A) ASCENDING AORTIC ROOT REPLACEMENT UTILIZING 27 KONECT RESILIA  AORTIC VALVE CONDUIT (N/A) TRANSESOPHAGEAL ECHOCARDIOGRAM (N/A) Subjective:  Transferred to 4E yesterday. Just finished eating most of his breakfast.  Says he feels good and is appreciative of all of his caregivers.   On RA.     Objective: Vital signs in last 24 hours: Temp:  [97.4 F (36.3 C)-99.3 F (37.4 C)] 98.3 F (36.8 C) (09/27 0416) Pulse Rate:  [92-117] 94 (09/27 0416) Cardiac Rhythm: Normal sinus rhythm (09/26 2300) Resp:  [20-28] 20 (09/27 0416) BP: (99-122)/(66-84) 121/74 (09/27 0416) SpO2:  [94 %-100 %] 97 % (09/27 0416) Weight:  [110.6 kg] 110.6 kg (09/27 0500)     Intake/Output from previous day: 09/26 0701 - 09/27 0700 In: 1519.4 [P.O.:720; I.V.:149.6; IV Piggyback:649.8] Out: 1860 [Urine:1860] Intake/Output this shift: No intake/output data recorded.  General appearance: alert, cooperative, and no distress Neurologic: no change in exam Heart: RRR, monitor showing SR / ST with no arrhythmias. Lungs: normal WOB on RA Abdomen: soft, NT Extremities: left hand and forearm swollen (unchanged) No tenderness. Wound: the sternotomy incision is intact and dry.  Lab Results: Recent Labs    04/06/23 0444 04/07/23 0240  WBC 10.3 11.2*  HGB 7.9* 7.6*  HCT 26.5* 25.1*  PLT 199 225   BMET:  Recent Labs    04/06/23 0444 04/07/23 0240  NA 140 139  K 3.4* 3.9  CL 103 103  CO2 30 27  GLUCOSE 91 114*  BUN 12 13  CREATININE 0.71 0.73  CALCIUM 8.6* 8.4*    PT/INR:  Recent Labs    04/07/23 0240  LABPROT 21.7*  INR 1.9*   ABG    Component Value Date/Time   PHART 7.371 04/01/2023 1401   HCO3 23.1 04/01/2023 1401   TCO2 24 04/01/2023 1401   ACIDBASEDEF 2.0 04/01/2023 1401    O2SAT 98 04/01/2023 1401   CBG (last 3)  Recent Labs    04/06/23 2000 04/07/23 0015 04/07/23 0414  GLUCAP 146* 110* 98      UPPER VENOUS STUDY     Patient Name:  Dale Mills  Date of Exam:   04/06/2023  Medical Rec #: [id number]       Accession #:    [id number]  Date of Birth: 12-01-1965       Patient Gender: M  Patient Age:   57 years  Exam Location:   Hospital  Procedure:      VAS [place] UPPER EXTREMITY VENOUS DUPLEX  Referring Phys: MYRON RODDENBERRY    ---------------------------------------------------------------------------  -----    Indications: Swelling  Other Indications: Hx of central line placement and piv to LUE.  Comparison Study: No previous exams   Performing Technologist: Jody Hill RVT, RDMS     Examination Guidelines: A complete evaluation includes B-mode imaging,  spectral  Doppler, color Doppler, and power Doppler as needed of all accessible  portions  of each vessel. Bilateral testing is considered an integral part of a  complete  examination. Limited examinations for reoccurring indications may be  performed  as noted.     Right Findings:  +----------+------------+---------+-----------+----------+-----------------  ---+  RIGHT    CompressiblePhasicitySpontaneousProperties      Summary          +----------+------------+---------+-----------+----------+-----------------  ---+  Subclavian              Yes       Yes                   patent by                                                                color/doppler       +----------+------------+---------+-----------+----------+-----------------  ---+     Left Findings:  +----------+------------+---------+-----------+----------+-------+  LEFT     CompressiblePhasicitySpontaneousPropertiesSummary  +----------+------------+---------+-----------+----------+-------+  IJV          None       No        No                Acute    +----------+------------+---------+-----------+----------+-------+  Subclavian   Full       Yes       Yes                       +----------+------------+---------+-----------+----------+-------+  Axillary     Full       Yes       Yes                       +----------+------------+---------+-----------+----------+-------+  Brachial     Full       Yes       Yes                       +----------+------------+---------+-----------+----------+-------+  Radial       Full                                           +----------+------------+---------+-----------+----------+-------+  Ulnar        Full                                           +----------+------------+---------+-----------+----------+-------+  Cephalic   Partial      No        No                Acute   +----------+------------+---------+-----------+----------+-------+  Basilic      Full       Yes       Yes                       +----------+------------+---------+-----------+----------+-------+     Summary:    Right:  No evidence of thrombosis in the subclavian.    Left:  Findings consistent with acute deep vein thrombosis involving the left  internal  jugular vein. Findings consistent with acute superficial vein thrombosis  involving the left cephalic vein.     *See table(s) above for measurements and observations.    Diagnosing physician: Heath Lark  Electronically signed by Heath Lark on 04/06/2023 at 5:08:09 PM.       Assessment/Plan: S/P Procedure(s) (LRB): MITRAL VALVE (MV) REPLACEMENT UTILIZING SIZE 33 MOSAIC PORCINE HEART VALVE (N/A) ASCENDING AORTIC  ROOT REPLACEMENT UTILIZING 27 KONECT RESILIA  AORTIC VALVE CONDUIT (N/A) TRANSESOPHAGEAL ECHOCARDIOGRAM (N/A)  -POD 7 MVR and Bio-Bentall for endocarditis. Continuing to progress. On ASA, low-dose metoprolol, Coumadin. INR 1.9. Coumadin dosing per pharmacy.  IV heparin bridge started early this morning by  pharmacy with plan to continue until INR 2.0 or greater.      -Left arm swelling-  Venous duplex scan yesterday confirmed  DVT in left internal jugular and superficial thrombosis in the left cephalic vein.  He is being anticoagulated.   -ID- Enterococcus bacteremia on admission.  On amp / ceftriaxone for 6 weeks from 6/22 per ID. MRI left hip prosthesis 9/25 showed no obvious indication of septic arthritis.   -PULM- on RA with good sats. CXR pending.  Good effort with IS. Ambulate.   -Post op a-fib- back in SR and or oral amiodarone.  K+ 3.9  -GI- tolerating regular diet, BMs yesterday  -CVA- pre-op,  suspected embolic from valvular vegetation. SLP/PT/OT on board, considering CIR.   -Disposition- Would benefit from inpatient rehab if this can be arranged. Rehab coordinator has discussed with patient and family and is following.    LOS: 10 days    Leary Roca, [place] [phone] 04/07/2023  Patient examined, last CXR reviewed. Agree with above assessment and plan Receiving antibiotics thru R PICC line OR culture of Ao valve positive for strep faecalis Nsr, minimal edema, on heparin bridge until INR > 2  with thrombus in L internal jugular vein. Add Iron, folic acid for Hb 7.9  P Landen Breeland MD

## 2023-04-07 NOTE — TOC Transition Note (Signed)
Transition of Care [name]) - CM/SW Discharge Note   Patient Details  Name: Dale Mills MRN: [id number] Date of Birth: [birth year]  [facility] CM/SW Contact:  Lockie Pares, RN Phone Number: 04/07/2023, 10:49 AM   Clinical Narrative:    Patient will be accepted to CIR today  No other needs identified TOC signing off   Final next level of care: IP Rehab Facility Barriers to Discharge: No Barriers Identified   Patient Goals and CMS Choice      Discharge Placement                         Discharge Plan and Services Additional resources added to the After Visit Summary for     Discharge Planning Services: CM Consult Post Acute Care Choice: IP Rehab                               Social Determinants of Health (SDOH) Interventions SDOH Screenings   Food Insecurity: Patient Unable To Answer (03/28/2023)  Recent Concern: Food Insecurity - Food Insecurity Present (03/17/2023)  Housing: Patient Unable To Answer (03/28/2023)  Transportation Needs: Patient Unable To Answer (03/28/2023)  Utilities: Patient Unable To Answer (03/28/2023)  Depression (PHQ2-9): Low Risk  (03/17/2023)  Tobacco Use: Low Risk  (03/28/2023)     Readmission Risk Interventions     No data to display

## 2023-04-08 ENCOUNTER — Inpatient Hospital Stay (HOSPITAL_COMMUNITY): Payer: Medicaid Other

## 2023-04-08 LAB — HEPARIN LEVEL (UNFRACTIONATED): Heparin Unfractionated: 0.14 [IU]/mL — ABNORMAL LOW (ref 0.30–0.70)

## 2023-04-08 LAB — BASIC METABOLIC PANEL
Anion gap: 8 (ref 5–15)
BUN: 10 mg/dL (ref 6–20)
CO2: 22 mmol/L (ref 22–32)
Calcium: 8.3 mg/dL — ABNORMAL LOW (ref 8.9–10.3)
Chloride: 105 mmol/L (ref 98–111)
Creatinine, Ser: 0.76 mg/dL (ref 0.61–1.24)
GFR, Estimated: >60 mL/min (ref >60)
Glucose, Bld: 121 mg/dL — ABNORMAL HIGH (ref 70–99)
Potassium: 3.7 mmol/L (ref 3.5–5.1)
Sodium: 135 mmol/L (ref 135–145)

## 2023-04-08 LAB — GLUCOSE, CAPILLARY
Glucose-Capillary: 137 mg/dL — ABNORMAL HIGH (ref 70–99)
Glucose-Capillary: 95 mg/dL (ref 70–99)
Glucose-Capillary: 122 mg/dL — ABNORMAL HIGH (ref 70–99)
Glucose-Capillary: 133 mg/dL — ABNORMAL HIGH (ref 70–99)
Glucose-Capillary: 118 mg/dL — ABNORMAL HIGH (ref 70–99)
Glucose-Capillary: 133 mg/dL — ABNORMAL HIGH (ref 70–99)

## 2023-04-08 LAB — CBC
HCT: 24.9 % — ABNORMAL LOW (ref 39.0–52.0)
Hemoglobin: 7.6 g/dL — ABNORMAL LOW (ref 13.0–17.0)
MCH: 28.5 pg (ref 26.0–34.0)
MCHC: 30.5 g/dL (ref 30.0–36.0)
MCV: 93.3 fL (ref 80.0–100.0)
Platelets: 255 10*3/uL (ref 150–400)
RBC: 2.67 MIL/uL — ABNORMAL LOW (ref 4.22–5.81)
RDW: 19.1 % — ABNORMAL HIGH (ref 11.5–15.5)
WBC: 10.9 10*3/uL — ABNORMAL HIGH (ref 4.0–10.5)
nRBC: 0 % (ref 0.0–0.2)

## 2023-04-08 LAB — PROTIME-INR
INR: 1.7 — ABNORMAL HIGH (ref 0.8–1.2)
Prothrombin Time: 20.2 s — ABNORMAL HIGH (ref 11.4–15.2)

## 2023-04-08 MED ORDER — WARFARIN SODIUM 5 MG PO TABS
5.0000 mg | ORAL_TABLET | Freq: Once | ORAL | Status: AC
  Administered 2023-04-08: 5 mg via ORAL
  Filled 2023-04-08: qty 1

## 2023-04-08 MED ORDER — ENOXAPARIN SODIUM 120 MG/0.8ML IJ SOSY
120.0000 mg | PREFILLED_SYRINGE | Freq: Two times a day (BID) | INTRAMUSCULAR | Status: DC
  Administered 2023-04-08 – 2023-04-09 (×2): 120 mg via SUBCUTANEOUS
  Filled 2023-04-08 (×3): qty 0.8

## 2023-04-08 MED ORDER — PANTOPRAZOLE SODIUM 40 MG PO TBEC: 40.0000 mg | DELAYED_RELEASE_TABLET | Freq: Every day | ORAL | Status: DC

## 2023-04-08 NOTE — Progress Notes (Signed)
ANTICOAGULATION CONSULT NOTE   Pharmacy Consult for warfarin Indication: atrial fibrillation  No Known Allergies  Patient Measurements: Weight: 119.6 kg (263 lb 10.7 oz) Heparin Dosing Weight: 100 kg  Vital Signs: Temp: 98.4 F (36.9 C) (09/28 0745) Temp Source: Oral (09/28 0745) BP: 134/81 (09/28 0745) Pulse Rate: 94 (09/28 0745)  Labs: Recent Labs    04/06/23 0444 04/07/23 0240 04/07/23 1003 04/08/23 0454  HGB 7.9* 7.6*  --  7.6*  HCT 26.5* 25.1*  --  24.9*  PLT 199 225  --  255  LABPROT 20.9* 21.7*  --  20.2*  INR 1.8* 1.9*  --  1.7*  HEPARINUNFRC  --  <0.10* <0.10* 0.14*  CREATININE 0.71 0.73  --  0.76    Estimated Creatinine Clearance: 134 mL/min (by C-G formula based on SCr of 0.76 mg/dL).   Medical History: Past Medical History:  Diagnosis Date   Angina pectoris (HCC) Canadian classification 2 04/10/2020   Anxiety    Arthritis    Coronary artery disease coronary CT angio did not show any major issues, suspicion for distal disease 06/12/2020   Depression    DJD (degenerative joint disease)    Dyslipidemia 02/13/2020   Essential hypertension 02/13/2020   Precordial chest pain 02/13/2020    Medications:  Scheduled:    stroke: early stages of recovery book   Does not apply Once   amiodarone  400 mg Oral BID   aspirin EC  81 mg Oral Daily   atorvastatin  80 mg Oral Daily   bisacodyl  10 mg Oral Daily   Or   bisacodyl  10 mg Rectal Daily   Chlorhexidine Gluconate Cloth  6 each Topical Daily   enoxaparin (LOVENOX) injection  110 mg Subcutaneous Q12H   ezetimibe  10 mg Oral Daily   Fe Fum-Vit C-Vit B12-FA  1 capsule Oral QPC breakfast   feeding supplement  237 mL Oral TID BM   furosemide  40 mg Oral Daily   insulin aspart  2-6 Units Subcutaneous TID AC & HS   insulin detemir  8 Units Subcutaneous Daily   metoprolol tartrate  12.5 mg Oral BID   mouth rinse  15 mL Mouth Rinse 4 times per day   pantoprazole  40 mg Oral Daily   sodium chloride flush   10-40 mL Intracatheter Q12H   sodium chloride flush  3 mL Intravenous Q12H   tamsulosin  0.4 mg Oral BID   Warfarin - Pharmacist Dosing Inpatient   Does not apply q1600   Infusions:   sodium chloride     sodium chloride Stopped (04/06/23 1048)   ampicillin (OMNIPEN) IV 2 g (04/08/23 0748)   cefTRIAXone (ROCEPHIN)  IV 2 g (04/07/23 2111)    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 Having recurrent Afib postop, pharmacy now consulted to start warfarin for Afib. No new DDIs identified, patient remains on amiodarone and ampicillin which may increase serum warfarin.  Acute DVT noted in the left internal jugular and a superficial thrombosis of left upper arm.  Pt has been refusing SCD's.  Given subtherapeutic INR , heparin drip started on 9/26. 9/27 changed heparin infusion to enoxaparin per pharmacy consult in conversation with CT surgery PA.     9/28 AM: INR remains subtherapeutic at 1.7 after 4 days on warfarin. CBC remains stable. Per chart documentation, patient is  now consuming 100% of his meals (previously 30%). Weight documented as increasing 9 kg overnight. New standing weight 117 kg per RN.      Goal of Therapy:  INR 2-3  Enoxaparin goal peak 0.6-1 units/mL, level monitoring not required for this patient   Plan: Warfarin 5 mg PO x 1  Enoxaparin dose increase to 120 mg Q12H Stop enoxaparin bridge when INR >2 for 24h and at least 5 days of parenteral anticoagulant has been given.  Daily INR, CBC  Jani Gravel, PharmD Clinical Pharmacist  04/08/2023 8:26 AM

## 2023-04-08 NOTE — Progress Notes (Signed)
Mobility Specialist Progress Note:   04/08/23 1019  Mobility  Activity Stood at bedside;Transferred from bed to chair  Level of Assistance Minimal assist, patient does 75% or more (+2)  Assistive Device  (HHA)  Distance Ambulated (ft) 3 ft  RUE Weight Bearing NWB  LUE Weight Bearing NWB  Activity Response Tolerated well  Mobility Referral Yes  $Mobility charge 1 Mobility  Mobility Specialist Start Time (ACUTE ONLY) 0945  Mobility Specialist Stop Time (ACUTE ONLY) [id number]  Mobility Specialist Time Calculation (min) (ACUTE ONLY) 7 min   Pre Mobility: 107 HR  During Mobility: 109 HR Post Mobility: 106 HR   Pt received in bed, agreeable to mobility per RN request. Pt displayed slight unsteadiness of RLE but able to stand and pivot with MinA +2. Pt left in chair with RN present in room.  Leory Plowman  Mobility Specialist Please contact via [employer] office at [phone]

## 2023-04-08 NOTE — Plan of Care (Signed)
  Problem: Education: Goal: Knowledge of General Education information will improve Description: Including pain rating scale, medication(s)/side effects and non-pharmacologic comfort measures Outcome: Progressing   Problem: Health Behavior/Discharge Planning: Goal: Ability to manage health-related needs will improve Outcome: Progressing   Problem: Clinical Measurements: Goal: Ability to maintain clinical measurements within normal limits will improve Outcome: Progressing Goal: Will remain free from infection Outcome: Progressing Goal: Diagnostic test results will improve Outcome: Progressing Goal: Respiratory complications will improve Outcome: Progressing Goal: Cardiovascular complication will be avoided Outcome: Progressing   Problem: Activity: Goal: Risk for activity intolerance will decrease Outcome: Progressing   Problem: Nutrition: Goal: Adequate nutrition will be maintained Outcome: Progressing   Problem: Elimination: Goal: Will not experience complications related to bowel motility Outcome: Progressing Goal: Will not experience complications related to urinary retention Outcome: Progressing   Problem: Pain Managment: Goal: General experience of comfort will improve Outcome: Progressing   Problem: Safety: Goal: Ability to remain free from injury will improve Outcome: Progressing   Problem: Skin Integrity: Goal: Risk for impaired skin integrity will decrease Outcome: Progressing   Problem: Activity: Goal: Ability to tolerate increased activity will improve Outcome: Progressing   Problem: Respiratory: Goal: Ability to maintain a clear airway and adequate ventilation will improve Outcome: Progressing   Problem: Role Relationship: Goal: Method of communication will improve Outcome: Progressing   Problem: Education: Goal: Ability to describe self-care measures that may prevent or decrease complications (Diabetes Survival Skills Education) will  improve Outcome: Progressing Goal: Individualized Educational Video(s) Outcome: Progressing   Problem: Coping: Goal: Ability to adjust to condition or change in health will improve Outcome: Progressing   Problem: Fluid Volume: Goal: Ability to maintain a balanced intake and output will improve Outcome: Progressing   Problem: Health Behavior/Discharge Planning: Goal: Ability to identify and utilize available resources and services will improve Outcome: Progressing Goal: Ability to manage health-related needs will improve Outcome: Progressing   Problem: Metabolic: Goal: Ability to maintain appropriate glucose levels will improve Outcome: Progressing   Problem: Nutritional: Goal: Maintenance of adequate nutrition will improve Outcome: Progressing Goal: Progress toward achieving an optimal weight will improve Outcome: Progressing   Problem: Skin Integrity: Goal: Risk for impaired skin integrity will decrease Outcome: Progressing   Problem: Tissue Perfusion: Goal: Adequacy of tissue perfusion will improve Outcome: Progressing   Problem: Education: Goal: Will demonstrate proper wound care and an understanding of methods to prevent future damage Outcome: Progressing Goal: Knowledge of disease or condition will improve Outcome: Progressing Goal: Knowledge of the prescribed therapeutic regimen will improve Outcome: Progressing Goal: Individualized Educational Video(s) Outcome: Progressing   Problem: Activity: Goal: Risk for activity intolerance will decrease Outcome: Progressing   Problem: Cardiac: Goal: Will achieve and/or maintain hemodynamic stability Outcome: Progressing   Problem: Clinical Measurements: Goal: Postoperative complications will be avoided or minimized Outcome: Progressing   Problem: Respiratory: Goal: Respiratory status will improve Outcome: Progressing   Problem: Skin Integrity: Goal: Wound healing without signs and symptoms of  infection Outcome: Progressing Goal: Risk for impaired skin integrity will decrease Outcome: Progressing   Problem: Urinary Elimination: Goal: Ability to achieve and maintain adequate renal perfusion and functioning will improve Outcome: Progressing   Problem: Safety: Goal: Non-violent Restraint(s) Outcome: Progressing

## 2023-04-08 NOTE — Progress Notes (Addendum)
8 Days Post-Op Procedure(s) (LRB): MITRAL VALVE (MV) REPLACEMENT UTILIZING SIZE 33 MOSAIC PORCINE HEART VALVE (N/A) ASCENDING AORTIC ROOT REPLACEMENT UTILIZING 27 KONECT RESILIA  AORTIC VALVE CONDUIT (N/A) TRANSESOPHAGEAL ECHOCARDIOGRAM (N/A) Subjective: Feels ok, remains pretty weak but improving  Objective: Vital signs in last 24 hours: Temp:  [98.1 F (36.7 C)-99.7 F (37.6 C)] 98.1 F (36.7 C) (09/27 2305) Pulse Rate:  [93-101] 95 (09/27 2305) Cardiac Rhythm: Normal sinus rhythm;Bundle branch block;Heart block (09/27 1900) Resp:  [18-20] 18 (09/27 2305) BP: (117-159)/(72-92) 153/73 (09/27 2305) SpO2:  [95 %-96 %] 96 % (09/27 2305) Weight:  [119.6 kg] 119.6 kg (09/28 0456)  Hemodynamic parameters for last 24 hours:    Intake/Output from previous day: 09/27 0701 - 09/28 0700 In: 1483 [P.O.:840; I.V.:243; IV Piggyback:400] Out: 1800 [Urine:1800] Intake/Output this shift: No intake/output data recorded.  General appearance: alert, cooperative, and no distress Heart: regular rate and rhythm and soft systolic murmur Lungs: clear to auscultation bilaterally Abdomen: benign Extremities: no LE edema Wound: incis healing well  Lab Results: Recent Labs    04/07/23 0240 04/08/23 0454  WBC 11.2* 10.9*  HGB 7.6* 7.6*  HCT 25.1* 24.9*  PLT 225 255   BMET:  Recent Labs    04/07/23 0240 04/08/23 0454  NA 139 135  K 3.9 3.7  CL 103 105  CO2 27 22  GLUCOSE 114* 121*  BUN 13 10  CREATININE 0.73 0.76  CALCIUM 8.4* 8.3*    PT/INR:  Recent Labs    04/08/23 0454  LABPROT 20.2*  INR 1.7*   ABG    Component Value Date/Time   PHART 7.371 04/01/2023 1401   HCO3 23.1 04/01/2023 1401   TCO2 24 04/01/2023 1401   ACIDBASEDEF 2.0 04/01/2023 1401   O2SAT 98 04/01/2023 1401   CBG (last 3)  Recent Labs    04/07/23 2019 04/07/23 2335 04/08/23 0331  GLUCAP 112* 145* 137*    Meds Scheduled Meds:   stroke: early stages of recovery book   Does not apply Once    amiodarone  400 mg Oral BID   aspirin EC  81 mg Oral Daily   atorvastatin  80 mg Oral Daily   bisacodyl  10 mg Oral Daily   Or   bisacodyl  10 mg Rectal Daily   Chlorhexidine Gluconate Cloth  6 each Topical Daily   enoxaparin (LOVENOX) injection  110 mg Subcutaneous Q12H   ezetimibe  10 mg Oral Daily   Fe Fum-Vit C-Vit B12-FA  1 capsule Oral QPC breakfast   feeding supplement  237 mL Oral TID BM   furosemide  40 mg Oral Daily   insulin aspart  2-6 Units Subcutaneous TID AC & HS   insulin detemir  8 Units Subcutaneous Daily   metoprolol tartrate  12.5 mg Oral BID   mouth rinse  15 mL Mouth Rinse 4 times per day   pantoprazole  40 mg Oral Daily   sodium chloride flush  10-40 mL Intracatheter Q12H   sodium chloride flush  3 mL Intravenous Q12H   tamsulosin  0.4 mg Oral BID   Warfarin - Pharmacist Dosing Inpatient   Does not apply q1600   Continuous Infusions:  sodium chloride     sodium chloride Stopped (04/06/23 1048)   ampicillin (OMNIPEN) IV 2 g (04/08/23 0411)   cefTRIAXone (ROCEPHIN)  IV 2 g (04/07/23 2111)   PRN Meds:.sodium chloride, metoprolol tartrate, morphine injection, ondansetron (ZOFRAN) IV, mouth rinse, oxyCODONE, sodium chloride flush, sodium chloride flush, traMADol,  traZODone  Xrays VAS [place] UPPER EXTREMITY VENOUS DUPLEX  Result Date: 04/06/2023 UPPER VENOUS STUDY  Patient Name:  Dale Mills  Date of Exam:   04/06/2023 Medical Rec #: [id number]       Accession #:    [id number] Date of Birth: [birth year]       Patient Gender: M Patient Age:   57 years Exam Location:  [facility] Procedure:      VAS [place] UPPER EXTREMITY VENOUS DUPLEX Referring Phys: Joette Catching RODDENBERRY --------------------------------------------------------------------------------  Indications: Swelling Other Indications: Hx of central line placement and piv to LUE. Comparison Study: No previous exams Performing Technologist: Jody Hill RVT, RDMS  Examination Guidelines: A complete evaluation includes  B-mode imaging, spectral Doppler, color Doppler, and power Doppler as needed of all accessible portions of each vessel. Bilateral testing is considered an integral part of a complete examination. Limited examinations for reoccurring indications may be performed as noted.  Right Findings: +----------+------------+---------+-----------+----------+--------------------+ RIGHT     CompressiblePhasicitySpontaneousProperties      Summary        +----------+------------+---------+-----------+----------+--------------------+ Subclavian               Yes       Yes                   patent by                                                              color/doppler     +----------+------------+---------+-----------+----------+--------------------+  Left Findings: +----------+------------+---------+-----------+----------+-------+ LEFT      CompressiblePhasicitySpontaneousPropertiesSummary +----------+------------+---------+-----------+----------+-------+ IJV           None       No        No                Acute  +----------+------------+---------+-----------+----------+-------+ Subclavian    Full       Yes       Yes                      +----------+------------+---------+-----------+----------+-------+ Axillary      Full       Yes       Yes                      +----------+------------+---------+-----------+----------+-------+ Brachial      Full       Yes       Yes                      +----------+------------+---------+-----------+----------+-------+ Radial        Full                                          +----------+------------+---------+-----------+----------+-------+ Ulnar         Full                                          +----------+------------+---------+-----------+----------+-------+ Cephalic    Partial      No        No  Acute  +----------+------------+---------+-----------+----------+-------+ Basilic       Full        Yes       Yes                      +----------+------------+---------+-----------+----------+-------+  Summary:  Right: No evidence of thrombosis in the subclavian.  Left: Findings consistent with acute deep vein thrombosis involving the left internal jugular vein. Findings consistent with acute superficial vein thrombosis involving the left cephalic vein.  *See table(s) above for measurements and observations.  Diagnosing physician: Heath Lark Electronically signed by Heath Lark on 04/06/2023 at 5:08:09 PM.    Final     Assessment/Plan: S/P Procedure(s) (LRB): MITRAL VALVE (MV) REPLACEMENT UTILIZING SIZE 33 MOSAIC PORCINE HEART VALVE (N/A) ASCENDING AORTIC ROOT REPLACEMENT UTILIZING 27 KONECT RESILIA  AORTIC VALVE CONDUIT (N/A) TRANSESOPHAGEAL ECHOCARDIOGRAM (N/A) POD#8  1 afeb, S BP 117-159, sinus , on ASA , low dose beta blocker 2 O2 sats good on RA 3 good UOP 4 weight not accurate as says 9 kg greater than yesterday 5 + BM 6 normal renal fxn 7 BS well controlled 8 leukocytosis trend conts to improve, WBC 10.9 9 Hgb stable at 7.6, on iron 10 INR 1.7, pharmacy dosing lovenox and coumadin, has upper ext DVT as described in addition to Bio bentall/MVR 11 ID- abx as outlined for enterococcus bacteremia and endocarditis- amp/ceftriaxone 12 appears to have been accepted to CIR 13 CXR no significant effus or infilts    LOS: 11 days    Renee Rival [phone] 04/08/2023 I have seen Dale Mills and talked with him and his daughter Frequent stools- stop dulcolax Ambulate more  Viviann Spare C. Dorris Fetch, MD [facility] [phone]

## 2023-04-08 NOTE — Plan of Care (Signed)
Problem: Education: Goal: Knowledge of General Education information will improve Description: Including pain rating scale, medication(s)/side effects and non-pharmacologic comfort measures 04/08/2023 0447 by Luther Redo, RN Outcome: Progressing 04/08/2023 0446 by Luther Redo, RN Outcome: Progressing   Problem: Health Behavior/Discharge Planning: Goal: Ability to manage health-related needs will improve 04/08/2023 0447 by Luther Redo, RN Outcome: Progressing 04/08/2023 0446 by Luther Redo, RN Outcome: Progressing   Problem: Clinical Measurements: Goal: Ability to maintain clinical measurements within normal limits will improve 04/08/2023 0447 by Luther Redo, RN Outcome: Progressing 04/08/2023 0446 by Luther Redo, RN Outcome: Progressing Goal: Will remain free from infection 04/08/2023 0447 by Luther Redo, RN Outcome: Progressing 04/08/2023 0446 by Luther Redo, RN Outcome: Progressing Goal: Diagnostic test results will improve 04/08/2023 0447 by Luther Redo, RN Outcome: Progressing 04/08/2023 0446 by Luther Redo, RN Outcome: Progressing Goal: Respiratory complications will improve 04/08/2023 0447 by Luther Redo, RN Outcome: Progressing 04/08/2023 0446 by Luther Redo, RN Outcome: Progressing Goal: Cardiovascular complication will be avoided 04/08/2023 0447 by Luther Redo, RN Outcome: Progressing 04/08/2023 0446 by Luther Redo, RN Outcome: Progressing   Problem: Activity: Goal: Risk for activity intolerance will decrease 04/08/2023 0447 by Luther Redo, RN Outcome: Progressing 04/08/2023 0446 by Luther Redo, RN Outcome: Progressing   Problem: Nutrition: Goal: Adequate nutrition will be maintained 04/08/2023 0447 by Luther Redo, RN Outcome: Progressing 04/08/2023 0446 by Luther Redo, RN Outcome: Progressing   Problem: Elimination: Goal: Will not experience  complications related to bowel motility 04/08/2023 0447 by Luther Redo, RN Outcome: Progressing 04/08/2023 0446 by Luther Redo, RN Outcome: Progressing Goal: Will not experience complications related to urinary retention 04/08/2023 0447 by Luther Redo, RN Outcome: Progressing 04/08/2023 0446 by Luther Redo, RN Outcome: Progressing   Problem: Pain Managment: Goal: General experience of comfort will improve 04/08/2023 0447 by Luther Redo, RN Outcome: Progressing 04/08/2023 0446 by Luther Redo, RN Outcome: Progressing   Problem: Safety: Goal: Ability to remain free from injury will improve 04/08/2023 0447 by Luther Redo, RN Outcome: Progressing 04/08/2023 0446 by Luther Redo, RN Outcome: Progressing   Problem: Skin Integrity: Goal: Risk for impaired skin integrity will decrease 04/08/2023 0447 by Luther Redo, RN Outcome: Progressing 04/08/2023 0446 by Luther Redo, RN Outcome: Progressing   Problem: Activity: Goal: Ability to tolerate increased activity will improve 04/08/2023 0447 by Luther Redo, RN Outcome: Progressing 04/08/2023 0446 by Luther Redo, RN Outcome: Progressing   Problem: Respiratory: Goal: Ability to maintain a clear airway and adequate ventilation will improve 04/08/2023 0447 by Luther Redo, RN Outcome: Progressing 04/08/2023 0446 by Luther Redo, RN Outcome: Progressing   Problem: Role Relationship: Goal: Method of communication will improve 04/08/2023 0447 by Luther Redo, RN Outcome: Progressing 04/08/2023 0446 by Luther Redo, RN Outcome: Progressing   Problem: Education: Goal: Ability to describe self-care measures that may prevent or decrease complications (Diabetes Survival Skills Education) will improve 04/08/2023 0447 by Luther Redo, RN Outcome: Progressing 04/08/2023 0446 by Luther Redo, RN Outcome: Progressing Goal: Individualized  Educational Video(s) 04/08/2023 0447 by Luther Redo, RN Outcome: Progressing 04/08/2023 0446 by Luther Redo, RN Outcome: Progressing   Problem: Coping: Goal: Ability to adjust to condition or change in health will improve 04/08/2023 0447 by Luther Redo, RN Outcome: Progressing 04/08/2023 0446 by Luther Redo, RN Outcome: Progressing   Problem: Fluid Volume: Goal: Ability to maintain a balanced intake and output will improve 04/08/2023 0447 by Luther Redo, RN Outcome: Progressing 04/08/2023 0446 by Luther Redo, RN Outcome: Progressing   Problem: Health Behavior/Discharge Planning: Goal: Ability to identify  and utilize available resources and services will improve 04/08/2023 0447 by Luther Redo, RN Outcome: Progressing 04/08/2023 0446 by Luther Redo, RN Outcome: Progressing Goal: Ability to manage health-related needs will improve 04/08/2023 0447 by Luther Redo, RN Outcome: Progressing 04/08/2023 0446 by Luther Redo, RN Outcome: Progressing   Problem: Metabolic: Goal: Ability to maintain appropriate glucose levels will improve 04/08/2023 0447 by Luther Redo, RN Outcome: Progressing 04/08/2023 0446 by Luther Redo, RN Outcome: Progressing   Problem: Nutritional: Goal: Maintenance of adequate nutrition will improve 04/08/2023 0447 by Luther Redo, RN Outcome: Progressing 04/08/2023 0446 by Luther Redo, RN Outcome: Progressing Goal: Progress toward achieving an optimal weight will improve 04/08/2023 0447 by Luther Redo, RN Outcome: Progressing 04/08/2023 0446 by Luther Redo, RN Outcome: Progressing   Problem: Skin Integrity: Goal: Risk for impaired skin integrity will decrease 04/08/2023 0447 by Luther Redo, RN Outcome: Progressing 04/08/2023 0446 by Luther Redo, RN Outcome: Progressing   Problem: Tissue Perfusion: Goal: Adequacy of tissue perfusion will  improve 04/08/2023 0447 by Luther Redo, RN Outcome: Progressing 04/08/2023 0446 by Luther Redo, RN Outcome: Progressing   Problem: Education: Goal: Will demonstrate proper wound care and an understanding of methods to prevent future damage 04/08/2023 0447 by Luther Redo, RN Outcome: Progressing 04/08/2023 0446 by Luther Redo, RN Outcome: Progressing Goal: Knowledge of disease or condition will improve 04/08/2023 0447 by Luther Redo, RN Outcome: Progressing 04/08/2023 0446 by Luther Redo, RN Outcome: Progressing Goal: Knowledge of the prescribed therapeutic regimen will improve 04/08/2023 0447 by Luther Redo, RN Outcome: Progressing 04/08/2023 0446 by Luther Redo, RN Outcome: Progressing Goal: Individualized Educational Video(s) 04/08/2023 0447 by Luther Redo, RN Outcome: Progressing 04/08/2023 0446 by Luther Redo, RN Outcome: Progressing   Problem: Activity: Goal: Risk for activity intolerance will decrease 04/08/2023 0447 by Luther Redo, RN Outcome: Progressing 04/08/2023 0446 by Luther Redo, RN Outcome: Progressing   Problem: Cardiac: Goal: Will achieve and/or maintain hemodynamic stability 04/08/2023 0447 by Luther Redo, RN Outcome: Progressing 04/08/2023 0446 by Luther Redo, RN Outcome: Progressing   Problem: Clinical Measurements: Goal: Postoperative complications will be avoided or minimized 04/08/2023 0447 by Luther Redo, RN Outcome: Progressing 04/08/2023 0446 by Luther Redo, RN Outcome: Progressing   Problem: Respiratory: Goal: Respiratory status will improve 04/08/2023 0447 by Luther Redo, RN Outcome: Progressing 04/08/2023 0446 by Luther Redo, RN Outcome: Progressing   Problem: Skin Integrity: Goal: Wound healing without signs and symptoms of infection 04/08/2023 0447 by Luther Redo, RN Outcome: Progressing 04/08/2023 0446 by Luther Redo, RN Outcome: Progressing Goal: Risk for impaired skin integrity will decrease 04/08/2023 0447 by Luther Redo, RN Outcome: Progressing 04/08/2023 0446 by Luther Redo, RN Outcome: Progressing   Problem: Urinary Elimination: Goal: Ability to achieve and maintain adequate renal perfusion and functioning will improve 04/08/2023 0447 by Luther Redo, RN Outcome: Progressing 04/08/2023 0446 by Luther Redo, RN Outcome: Progressing   Problem: Safety: Goal: Non-violent Restraint(s) 04/08/2023 0447 by Luther Redo, RN Outcome: Progressing 04/08/2023 0446 by Luther Redo, RN Outcome: Progressing

## 2023-04-09 ENCOUNTER — Inpatient Hospital Stay (HOSPITAL_COMMUNITY)
Admission: RE | Admit: 2023-04-09 | Discharge: 2023-04-26 | DRG: 945 | Disposition: A | Discharge status: Home Health | Payer: Medicaid Other | Source: Intra-hospital | Attending: Physical Medicine & Rehabilitation | Admitting: Physical Medicine & Rehabilitation

## 2023-04-09 DIAGNOSIS — R600 Localized edema: Secondary | ICD-10-CM | POA: Diagnosis not present

## 2023-04-09 DIAGNOSIS — E785 Hyperlipidemia, unspecified: Secondary | ICD-10-CM | POA: Diagnosis present

## 2023-04-09 DIAGNOSIS — Z8249 Family history of ischemic heart disease and other diseases of the circulatory system: Secondary | ICD-10-CM

## 2023-04-09 DIAGNOSIS — R197 Diarrhea, unspecified: Secondary | ICD-10-CM | POA: Diagnosis present

## 2023-04-09 DIAGNOSIS — I9789 Other postprocedural complications and disorders of the circulatory system, not elsewhere classified: Secondary | ICD-10-CM

## 2023-04-09 DIAGNOSIS — R7303 Prediabetes: Secondary | ICD-10-CM | POA: Diagnosis present

## 2023-04-09 DIAGNOSIS — R414 Neurologic neglect syndrome: Secondary | ICD-10-CM | POA: Diagnosis present

## 2023-04-09 DIAGNOSIS — I69318 Other symptoms and signs involving cognitive functions following cerebral infarction: Secondary | ICD-10-CM | POA: Diagnosis not present

## 2023-04-09 DIAGNOSIS — Z7982 Long term (current) use of aspirin: Secondary | ICD-10-CM

## 2023-04-09 DIAGNOSIS — E119 Type 2 diabetes mellitus without complications: Secondary | ICD-10-CM

## 2023-04-09 DIAGNOSIS — I10 Essential (primary) hypertension: Secondary | ICD-10-CM | POA: Diagnosis present

## 2023-04-09 DIAGNOSIS — H53462 Homonymous bilateral field defects, left side: Secondary | ICD-10-CM | POA: Diagnosis present

## 2023-04-09 DIAGNOSIS — I4819 Other persistent atrial fibrillation: Secondary | ICD-10-CM | POA: Diagnosis present

## 2023-04-09 DIAGNOSIS — I69398 Other sequelae of cerebral infarction: Secondary | ICD-10-CM | POA: Diagnosis not present

## 2023-04-09 DIAGNOSIS — G4700 Insomnia, unspecified: Secondary | ICD-10-CM | POA: Diagnosis present

## 2023-04-09 DIAGNOSIS — Z952 Presence of prosthetic heart valve: Secondary | ICD-10-CM

## 2023-04-09 DIAGNOSIS — R0602 Shortness of breath: Secondary | ICD-10-CM

## 2023-04-09 DIAGNOSIS — I330 Acute and subacute infective endocarditis: Secondary | ICD-10-CM

## 2023-04-09 DIAGNOSIS — Z79899 Other long term (current) drug therapy: Secondary | ICD-10-CM

## 2023-04-09 DIAGNOSIS — I69322 Dysarthria following cerebral infarction: Secondary | ICD-10-CM | POA: Diagnosis not present

## 2023-04-09 DIAGNOSIS — H538 Other visual disturbances: Secondary | ICD-10-CM | POA: Diagnosis not present

## 2023-04-09 DIAGNOSIS — R0609 Other forms of dyspnea: Secondary | ICD-10-CM | POA: Diagnosis not present

## 2023-04-09 DIAGNOSIS — Z794 Long term (current) use of insulin: Secondary | ICD-10-CM

## 2023-04-09 DIAGNOSIS — Z841 Family history of disorders of kidney and ureter: Secondary | ICD-10-CM

## 2023-04-09 DIAGNOSIS — E8779 Other fluid overload: Secondary | ICD-10-CM | POA: Diagnosis not present

## 2023-04-09 DIAGNOSIS — I63532 Cerebral infarction due to unspecified occlusion or stenosis of left posterior cerebral artery: Principal | ICD-10-CM | POA: Diagnosis present

## 2023-04-09 DIAGNOSIS — Z5941 Food insecurity: Secondary | ICD-10-CM | POA: Diagnosis not present

## 2023-04-09 DIAGNOSIS — E876 Hypokalemia: Secondary | ICD-10-CM | POA: Diagnosis present

## 2023-04-09 DIAGNOSIS — I82622 Acute embolism and thrombosis of deep veins of left upper extremity: Secondary | ICD-10-CM

## 2023-04-09 DIAGNOSIS — Z8679 Personal history of other diseases of the circulatory system: Secondary | ICD-10-CM

## 2023-04-09 DIAGNOSIS — B952 Enterococcus as the cause of diseases classified elsewhere: Secondary | ICD-10-CM

## 2023-04-09 DIAGNOSIS — E871 Hypo-osmolality and hyponatremia: Secondary | ICD-10-CM | POA: Diagnosis present

## 2023-04-09 DIAGNOSIS — I059 Rheumatic mitral valve disease, unspecified: Secondary | ICD-10-CM | POA: Diagnosis not present

## 2023-04-09 DIAGNOSIS — Z23 Encounter for immunization: Secondary | ICD-10-CM | POA: Diagnosis not present

## 2023-04-09 DIAGNOSIS — D62 Acute posthemorrhagic anemia: Secondary | ICD-10-CM | POA: Diagnosis present

## 2023-04-09 DIAGNOSIS — I3139 Other pericardial effusion (noninflammatory): Secondary | ICD-10-CM | POA: Diagnosis not present

## 2023-04-09 DIAGNOSIS — R5381 Other malaise: Secondary | ICD-10-CM | POA: Diagnosis present

## 2023-04-09 DIAGNOSIS — I2510 Atherosclerotic heart disease of native coronary artery without angina pectoris: Secondary | ICD-10-CM | POA: Diagnosis present

## 2023-04-09 DIAGNOSIS — R7881 Bacteremia: Secondary | ICD-10-CM | POA: Diagnosis not present

## 2023-04-09 HISTORY — DX: Cerebral infarction due to unspecified occlusion or stenosis of left posterior cerebral artery: I63.532

## 2023-04-09 LAB — PROTIME-INR
INR: 2.1 — ABNORMAL HIGH (ref 0.8–1.2)
Prothrombin Time: 23.5 s — ABNORMAL HIGH (ref 11.4–15.2)

## 2023-04-09 LAB — GLUCOSE, CAPILLARY
Glucose-Capillary: 124 mg/dL — ABNORMAL HIGH (ref 70–99)
Glucose-Capillary: 102 mg/dL — ABNORMAL HIGH (ref 70–99)
Glucose-Capillary: 130 mg/dL — ABNORMAL HIGH (ref 70–99)
Glucose-Capillary: 104 mg/dL — ABNORMAL HIGH (ref 70–99)
Glucose-Capillary: 130 mg/dL — ABNORMAL HIGH (ref 70–99)

## 2023-04-09 LAB — BASIC METABOLIC PANEL
Anion gap: 9 (ref 5–15)
BUN: 9 mg/dL (ref 6–20)
CO2: 24 mmol/L (ref 22–32)
Calcium: 8.4 mg/dL — ABNORMAL LOW (ref 8.9–10.3)
Chloride: 102 mmol/L (ref 98–111)
Creatinine, Ser: 0.72 mg/dL (ref 0.61–1.24)
GFR, Estimated: >60 mL/min (ref >60)
Glucose, Bld: 112 mg/dL — ABNORMAL HIGH (ref 70–99)
Potassium: 3.8 mmol/L (ref 3.5–5.1)
Sodium: 135 mmol/L (ref 135–145)

## 2023-04-09 LAB — CBC
HCT: 25.7 % — ABNORMAL LOW (ref 39.0–52.0)
Hemoglobin: 7.9 g/dL — ABNORMAL LOW (ref 13.0–17.0)
MCH: 28.4 pg (ref 26.0–34.0)
MCHC: 30.7 g/dL (ref 30.0–36.0)
MCV: 92.4 fL (ref 80.0–100.0)
Platelets: 304 10*3/uL (ref 150–400)
RBC: 2.78 MIL/uL — ABNORMAL LOW (ref 4.22–5.81)
RDW: 18.8 % — ABNORMAL HIGH (ref 11.5–15.5)
WBC: 9 10*3/uL (ref 4.0–10.5)
nRBC: 0 % (ref 0.0–0.2)

## 2023-04-09 LAB — ECHO INTRAOPERATIVE TEE
AR max vel: 3 cm2
AV Area VTI: 3.28 cm2
AV Area mean vel: 3.11 cm2
AV Mean grad: 4 mm[Hg]
AV Peak grad: 7.1 mm[Hg]
Ao pk vel: 1.34 m/s
MV M vel: 4.57 m/s
MV Peak grad: 83.5 mm[Hg]
MV VTI: 2.59 cm2
MV Vena cont: 0.8 cm
P 1/2 time: 257 ms
Radius: 1.7 cm

## 2023-04-09 MED ORDER — WARFARIN SODIUM 5 MG PO TABS: 5.0000 mg | ORAL_TABLET | Freq: Once | ORAL | Status: DC

## 2023-04-09 MED ORDER — JUVEN PO PACK
1.0000 | PACK | Freq: Two times a day (BID) | ORAL | Status: DC
  Administered 2023-04-10 – 2023-04-26 (×29): 1 via ORAL
  Filled 2023-04-09 (×30): qty 1

## 2023-04-09 MED ORDER — COVID-19 MRNA VAC-TRIS(PFIZER) 30 MCG/0.3ML IM SUSY
0.3000 mL | PREFILLED_SYRINGE | Freq: Once | INTRAMUSCULAR | Status: DC
Start: 2023-04-09 — End: 2023-04-09

## 2023-04-09 MED ORDER — METOPROLOL TARTRATE 12.5 MG HALF TABLET
12.5000 mg | ORAL_TABLET | Freq: Two times a day (BID) | ORAL | Status: DC
  Administered 2023-04-09 – 2023-04-26 (×34): 12.5 mg via ORAL
  Filled 2023-04-09 (×34): qty 1

## 2023-04-09 MED ORDER — ACETAMINOPHEN 325 MG PO TABS: 325.0000 mg | ORAL_TABLET | ORAL | Status: DC | PRN

## 2023-04-09 MED ORDER — ORAL CARE MOUTH RINSE
15.0000 mL | OROMUCOSAL | Status: DC
  Administered 2023-04-09 – 2023-04-26 (×58): 15 mL via OROMUCOSAL

## 2023-04-09 MED ORDER — ALUM & MAG HYDROXIDE-SIMETH 200-200-20 MG/5ML PO SUSP: 30.0000 mL | ORAL | Status: DC | PRN

## 2023-04-09 MED ORDER — BISACODYL 10 MG RE SUPP: 10.0000 mg | Freq: Every day | RECTAL | Status: DC | PRN

## 2023-04-09 MED ORDER — PROCHLORPERAZINE MALEATE 5 MG PO TABS: 5.0000 mg | ORAL_TABLET | Freq: Four times a day (QID) | ORAL | Status: DC | PRN

## 2023-04-09 MED ORDER — ATORVASTATIN CALCIUM 80 MG PO TABS
80.0000 mg | ORAL_TABLET | Freq: Every day | ORAL | Status: DC
  Administered 2023-04-10 – 2023-04-26 (×17): 80 mg via ORAL
  Filled 2023-04-09 (×17): qty 1

## 2023-04-09 MED ORDER — VITAMIN C 500 MG PO TABS
500.0000 mg | ORAL_TABLET | Freq: Every day | ORAL | Status: DC
  Administered 2023-04-10 – 2023-04-26 (×17): 500 mg via ORAL
  Filled 2023-04-09 (×17): qty 1

## 2023-04-09 MED ORDER — PANTOPRAZOLE SODIUM 40 MG PO TBEC
40.0000 mg | DELAYED_RELEASE_TABLET | Freq: Every day | ORAL | Status: DC
  Administered 2023-04-10 – 2023-04-26 (×17): 40 mg via ORAL
  Filled 2023-04-09 (×17): qty 1

## 2023-04-09 MED ORDER — COVID-19 MRNA VAC-TRIS(PFIZER) 30 MCG/0.3ML IM SUSY
0.3000 mL | PREFILLED_SYRINGE | Freq: Once | INTRAMUSCULAR | Status: AC
  Administered 2023-04-12: 0.3 mL via INTRAMUSCULAR
  Filled 2023-04-09: qty 0.3

## 2023-04-09 MED ORDER — ENSURE MAX PROTEIN PO LIQD
11.0000 [oz_av] | Freq: Two times a day (BID) | ORAL | Status: DC
  Administered 2023-04-09 – 2023-04-26 (×31): 11 [oz_av] via ORAL

## 2023-04-09 MED ORDER — WARFARIN - PHARMACIST DOSING INPATIENT: Freq: Every day | Status: DC

## 2023-04-09 MED ORDER — OXYCODONE HCL 5 MG PO TABS
5.0000 mg | ORAL_TABLET | ORAL | Status: DC | PRN
  Administered 2023-04-20: 10 mg via ORAL
  Filled 2023-04-09: qty 2
  Filled 2023-04-09: qty 1

## 2023-04-09 MED ORDER — LIVING WELL WITH DIABETES BOOK
Freq: Once | Status: AC
  Filled 2023-04-09: qty 1

## 2023-04-09 MED ORDER — SODIUM CHLORIDE 0.9 % IV SOLN
2.0000 g | INTRAVENOUS | Status: DC
  Administered 2023-04-09 – 2023-04-26 (×98): 2 g via INTRAVENOUS
  Filled 2023-04-09 (×99): qty 2000

## 2023-04-09 MED ORDER — INSULIN DETEMIR 100 UNIT/ML ~~LOC~~ SOLN
8.0000 [IU] | Freq: Every day | SUBCUTANEOUS | Status: DC
  Administered 2023-04-10: 8 [IU] via SUBCUTANEOUS
  Filled 2023-04-09 (×2): qty 0.08

## 2023-04-09 MED ORDER — SODIUM CHLORIDE 0.9 % IV SOLN
2.0000 g | Freq: Two times a day (BID) | INTRAVENOUS | Status: DC
  Administered 2023-04-09 – 2023-04-26 (×34): 2 g via INTRAVENOUS
  Filled 2023-04-09 (×36): qty 20

## 2023-04-09 MED ORDER — PROSOURCE PLUS PO LIQD
30.0000 mL | Freq: Two times a day (BID) | ORAL | Status: DC
  Administered 2023-04-10 – 2023-04-26 (×32): 30 mL via ORAL
  Filled 2023-04-09 (×31): qty 30

## 2023-04-09 MED ORDER — TRAMADOL HCL 50 MG PO TABS: 50.0000 mg | ORAL_TABLET | ORAL | Status: DC | PRN

## 2023-04-09 MED ORDER — TAMSULOSIN HCL 0.4 MG PO CAPS
0.4000 mg | ORAL_CAPSULE | Freq: Two times a day (BID) | ORAL | Status: DC
  Administered 2023-04-10 – 2023-04-11 (×4): 0.4 mg via ORAL
  Filled 2023-04-09 (×5): qty 1

## 2023-04-09 MED ORDER — FLEET ENEMA RE ENEM: 1.0000 | ENEMA | Freq: Once | RECTAL | Status: DC | PRN

## 2023-04-09 MED ORDER — FE FUM-VIT C-VIT B12-FA 460-60-0.01-1 MG PO CAPS
1.0000 | ORAL_CAPSULE | Freq: Every day | ORAL | Status: DC
  Administered 2023-04-10 – 2023-04-26 (×17): 1 via ORAL
  Filled 2023-04-09 (×18): qty 1

## 2023-04-09 MED ORDER — PROCHLORPERAZINE EDISYLATE 10 MG/2ML IJ SOLN
5.0000 mg | Freq: Four times a day (QID) | INTRAMUSCULAR | Status: DC | PRN
  Administered 2023-04-10: 5 mg via INTRAVENOUS
  Filled 2023-04-09: qty 2

## 2023-04-09 MED ORDER — FUROSEMIDE 40 MG PO TABS
40.0000 mg | ORAL_TABLET | Freq: Every day | ORAL | Status: DC
  Administered 2023-04-10 – 2023-04-13 (×4): 40 mg via ORAL
  Filled 2023-04-09 (×4): qty 1

## 2023-04-09 MED ORDER — WARFARIN SODIUM 2.5 MG PO TABS
5.0000 mg | ORAL_TABLET | Freq: Once | ORAL | Status: AC
  Administered 2023-04-09: 5 mg via ORAL
  Filled 2023-04-09: qty 2

## 2023-04-09 MED ORDER — PROCHLORPERAZINE 25 MG RE SUPP: 12.5000 mg | Freq: Four times a day (QID) | RECTAL | Status: DC | PRN

## 2023-04-09 MED ORDER — EZETIMIBE 10 MG PO TABS
10.0000 mg | ORAL_TABLET | Freq: Every day | ORAL | Status: DC
  Administered 2023-04-10 – 2023-04-26 (×17): 10 mg via ORAL
  Filled 2023-04-09 (×17): qty 1

## 2023-04-09 MED ORDER — DIPHENHYDRAMINE HCL 25 MG PO CAPS: 25.0000 mg | ORAL_CAPSULE | Freq: Four times a day (QID) | ORAL | Status: DC | PRN

## 2023-04-09 MED ORDER — ENOXAPARIN SODIUM 120 MG/0.8ML IJ SOSY
120.0000 mg | PREFILLED_SYRINGE | Freq: Two times a day (BID) | INTRAMUSCULAR | Status: AC
  Administered 2023-04-09 – 2023-04-11 (×5): 120 mg via SUBCUTANEOUS
  Filled 2023-04-09 (×5): qty 0.8

## 2023-04-09 MED ORDER — INFLUENZA VIRUS VACC SPLIT PF (FLUZONE) 0.5 ML IM SUSY
0.5000 mL | PREFILLED_SYRINGE | INTRAMUSCULAR | Status: AC
  Administered 2023-04-10: 0.5 mL via INTRAMUSCULAR
  Filled 2023-04-09: qty 0.5

## 2023-04-09 MED ORDER — ASPIRIN 81 MG PO TBEC
81.0000 mg | DELAYED_RELEASE_TABLET | Freq: Every day | ORAL | Status: DC
  Administered 2023-04-10 – 2023-04-26 (×17): 81 mg via ORAL
  Filled 2023-04-09 (×17): qty 1

## 2023-04-09 MED ORDER — GUAIFENESIN-DM 100-10 MG/5ML PO SYRP
5.0000 mL | ORAL_SOLUTION | Freq: Four times a day (QID) | ORAL | Status: DC | PRN
  Administered 2023-04-09 – 2023-04-12 (×2): 10 mL via ORAL
  Filled 2023-04-09 (×2): qty 10

## 2023-04-09 MED ORDER — INSULIN ASPART 100 UNIT/ML IJ SOLN
2.0000 [IU] | Freq: Three times a day (TID) | INTRAMUSCULAR | Status: DC
  Administered 2023-04-12 – 2023-04-25 (×12): 2 [IU] via SUBCUTANEOUS

## 2023-04-09 MED ORDER — AMIODARONE HCL 200 MG PO TABS
400.0000 mg | ORAL_TABLET | Freq: Two times a day (BID) | ORAL | Status: DC
  Administered 2023-04-09 – 2023-04-13 (×9): 400 mg via ORAL
  Filled 2023-04-09 (×9): qty 2

## 2023-04-09 MED ORDER — CHLORHEXIDINE GLUCONATE CLOTH 2 % EX PADS: 6.0000 | MEDICATED_PAD | Freq: Every day | CUTANEOUS | Status: DC

## 2023-04-09 MED ORDER — TRAZODONE HCL 50 MG PO TABS
25.0000 mg | ORAL_TABLET | Freq: Every evening | ORAL | Status: DC | PRN
  Administered 2023-04-09: 50 mg via ORAL
  Filled 2023-04-09: qty 1

## 2023-04-09 NOTE — Progress Notes (Signed)
Pt arrived to 4w10 via bed. Patient is A:Ox4, pt has been oriented to unit and expresses understanding. No needs at this time, call light in reach, bed in lowest setting.

## 2023-04-09 NOTE — Progress Notes (Addendum)
ANTICOAGULATION CONSULT NOTE   Pharmacy Consult for warfarin Indication: atrial fibrillation  No Known Allergies  Patient Measurements: Weight: 116.6 kg (257 lb 0.9 oz) Heparin Dosing Weight: 100 kg  Vital Signs: Temp: 98.2 F (36.8 C) (09/29 0310) Temp Source: Oral (09/29 0310) BP: 118/87 (09/29 0739) Pulse Rate: 94 (09/29 0739)  Labs: Recent Labs    04/07/23 0240 04/07/23 1003 04/08/23 0454 04/09/23 0440  HGB 7.6*  --  7.6* 7.9*  HCT 25.1*  --  24.9* 25.7*  PLT 225  --  255 304  LABPROT 21.7*  --  20.2* 23.5*  INR 1.9*  --  1.7* 2.1*  HEPARINUNFRC <0.10* <0.10* 0.14*  --   CREATININE 0.73  --  0.76 0.72    Estimated Creatinine Clearance: 132.3 mL/min (by C-G formula based on SCr of 0.72 mg/dL).   Medical History: Past Medical History:  Diagnosis Date   Angina pectoris (HCC) Canadian classification 2 04/10/2020   Anxiety    Arthritis    Coronary artery disease coronary CT angio did not show any major issues, suspicion for distal disease 06/12/2020   Depression    DJD (degenerative joint disease)    Dyslipidemia 02/13/2020   Essential hypertension 02/13/2020   Precordial chest pain 02/13/2020    Medications:  Scheduled:    stroke: early stages of recovery book   Does not apply Once   amiodarone  400 mg Oral BID   aspirin EC  81 mg Oral Daily   atorvastatin  80 mg Oral Daily   Chlorhexidine Gluconate Cloth  6 each Topical Daily   enoxaparin (LOVENOX) injection  120 mg Subcutaneous Q12H   ezetimibe  10 mg Oral Daily   Fe Fum-Vit C-Vit B12-FA  1 capsule Oral QPC breakfast   feeding supplement  237 mL Oral TID BM   furosemide  40 mg Oral Daily   insulin aspart  2-6 Units Subcutaneous TID AC & HS   insulin detemir  8 Units Subcutaneous Daily   metoprolol tartrate  12.5 mg Oral BID   mouth rinse  15 mL Mouth Rinse 4 times per day   pantoprazole  40 mg Oral Daily   sodium chloride flush  10-40 mL Intracatheter Q12H   sodium chloride flush  3 mL  Intravenous Q12H   tamsulosin  0.4 mg Oral BID   Warfarin - Pharmacist Dosing Inpatient   Does not apply q1600   Infusions:   sodium chloride     sodium chloride Stopped (04/06/23 1048)   ampicillin (OMNIPEN) IV 2 g (04/09/23 0656)   cefTRIAXone (ROCEPHIN)  IV 2 g (04/08/23 2045)    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 Having recurrent Afib postop, pharmacy now consulted to start warfarin for Afib. No new DDIs identified, patient remains on amiodarone and ampicillin which may increase serum warfarin.  Acute DVT noted in the left internal jugular and a superficial thrombosis of left upper arm.  Pt has been refusing SCD's.  Given subtherapeutic INR , heparin drip started on 9/26. 9/27 changed heparin infusion to enoxaparin per pharmacy consult in conversation with CT surgery PA.     9/29 AM: INR now therapeutic at 2.1 on D# 6 of warfarin. Patient is now eating less of his meals. Patient has received ~4 days of parenteral anticoagulation- although heparin was started ~2100 on 9/26 and did not achieve  a therapeutic level prior to switching to lovenox on 9/27. Would continue with lovenox at least until 10/1 AM assuming patients INR remains >2. Hgb remains stable in the 7s. No signs of bleeding noted. Awaiting CIR bed.    Goal of Therapy:  INR 2-3  Enoxaparin goal peak 0.6-1 units/mL, level monitoring not required for this patient   Plan: Warfarin 5 mg PO x 1  Continue enoxaparin at 120 mg Q12H Stop enoxaparin bridge when INR >2 for 24h and at least 5 days of parenteral anticoagulant has been given.  Daily INR, CBC  Jani Gravel, PharmD Clinical Pharmacist  04/09/2023 8:30 AM

## 2023-04-09 NOTE — Progress Notes (Signed)
Inpatient Rehab Admissions Coordinator:    I have a CIR bed for this Pt. Today. Rn may call report to [phone].  Marquest Gunkel, MS, CCC-SLP Rehab Admissions Coordinator  [phone] (celll) [phone] (office)  

## 2023-04-09 NOTE — Progress Notes (Signed)
ANTICOAGULATION CONSULT NOTE   Pharmacy Consult for warfarin Indication: atrial fibrillation  No Known Allergies  Patient Measurements: Height: 6' (182.9 cm) Weight: 115.8 kg (255 lb 4.7 oz) IBW/kg (Calculated) : 77.6 Heparin Dosing Weight: 100 kg  Vital Signs: Temp: 98.4 F (36.9 C) (09/29 1558) Temp Source: Oral (09/29 1558) BP: 120/66 (09/29 1558) Pulse Rate: 100 (09/29 1558)  Labs: Recent Labs    04/07/23 0240 04/07/23 1003 04/08/23 0454 04/09/23 0440  HGB 7.6*  --  7.6* 7.9*  HCT 25.1*  --  24.9* 25.7*  PLT 225  --  255 304  LABPROT 21.7*  --  20.2* 23.5*  INR 1.9*  --  1.7* 2.1*  HEPARINUNFRC <0.10* <0.10* 0.14*  --   CREATININE 0.73  --  0.76 0.72    Estimated Creatinine Clearance: 133.9 mL/min (by C-G formula based on SCr of 0.72 mg/dL).   Medical History: Past Medical History:  Diagnosis Date   Angina pectoris (HCC) [place] classification 2 04/10/2020   Anxiety    Arthritis    Coronary artery disease coronary CT angio did not show any major issues, suspicion for distal disease 06/12/2020   Depression    DJD (degenerative joint disease)    Dyslipidemia 02/13/2020   Essential hypertension 02/13/2020   Precordial chest pain 02/13/2020    Medications:  Scheduled:   [START ON 04/10/2023] (feeding supplement) PROSource Plus  30 mL Oral BID BM   amiodarone  400 mg Oral BID   [START ON 04/10/2023] ascorbic acid  500 mg Oral Daily   [START ON 04/10/2023] aspirin EC  81 mg Oral Daily   [START ON 04/10/2023] atorvastatin  80 mg Oral Daily   [START ON 04/10/2023] Chlorhexidine Gluconate Cloth  6 each Topical Daily   [START ON 04/10/2023] ezetimibe  10 mg Oral Daily   [START ON 04/10/2023] Fe Fum-Vit C-Vit B12-FA  1 capsule Oral QPC breakfast   [START ON 04/10/2023] furosemide  40 mg Oral Daily   insulin aspart  2-6 Units Subcutaneous TID AC & HS   [START ON 04/10/2023] insulin detemir  8 Units Subcutaneous Daily   living well with diabetes book   Does not apply  Once   metoprolol tartrate  12.5 mg Oral BID   [START ON 04/10/2023] nutrition supplement (JUVEN)  1 packet Oral BID BM   mouth rinse  15 mL Mouth Rinse 4 times per day   [START ON 04/10/2023] pantoprazole  40 mg Oral Daily   Ensure Max Protein  11 oz Oral BID   tamsulosin  0.4 mg Oral BID   [START ON 04/10/2023] Warfarin - Pharmacist Dosing Inpatient   Does not apply q1600   Infusions:   ampicillin (OMNIPEN) IV     cefTRIAXone (ROCEPHIN)  IV      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 Having recurrent Afib postop, pharmacy now consulted to start warfarin for Afib. No new DDIs identified, patient remains on amiodarone and ampicillin which may increase serum warfarin.  Acute DVT noted in the left internal jugular and a superficial thrombosis of left upper arm.  Pt has been refusing SCD's.  Given subtherapeutic INR , heparin drip started on 9/26. 9/27 changed heparin infusion to enoxaparin per pharmacy consult in conversation with CT surgery PA.    INR now therapeutic at 2.1 on D# 6 of warfarin and he has  transferred to Rehab. Patient is now eating less of his meals. Patient has received ~4 days of parenteral anticoagulation- although heparin was started ~2100 on 9/26 and did not achieve a therapeutic level prior to switching to lovenox on 9/27. Would continue with lovenox at least until 10/1 AM assuming patients INR remains >2.   -last dose of lovenox given ~ 5am today -last warfarin given was 9/28  Goal of Therapy:  INR 2-3  Enoxaparin goal peak 0.6-1 units/mL, level monitoring not required for this patient   Plan: -Warfarin 5 mg PO x 1  -Continue enoxaparin at 120 mg Q12H -Stop enoxaparin bridge when INR >2 for 24h and at least 5 days of parenteral anticoagulant has been given.  -Daily INR, CBC every 3 days  Harland German, PharmD Clinical Pharmacist **Pharmacist phone directory can now be found on [email or web address] ([email or web address]).  Listed under [facility].  Harland German, PharmD Clinical Pharmacist **Pharmacist phone directory can now be found on [email or web address] ([email or web address]).  Listed under [facility].

## 2023-04-09 NOTE — PMR Pre-admission (Signed)
PMR Admission Coordinator Pre-Admission Assessment   Patient: Dale Mills is an 57 y.o., male MRN: [id number] DOB: 08-05-1965 Height:   Weight: 110.6 kg (done by [employer])   [employer] HMO:     PPO:      PCP:      IPA:      80/20:      OTHER:  PRIMARY: uninsured      Policy#:       Subscriber:  CM Name:       Phone#:      Fax#:  Pre-Cert#:       Employer:  Benefits:  Phone #:      Name:  Eff. Date:      Deduct:       Out of Pocket Max:       Life Max:  CIR:       SNF:  Outpatient:      Co-Pay:  Home Health:       Co-Pay:  DME:      Co-Pay:  Providers:  SECONDARY:       Policy#:      Phone#:    Artist:       Phone#:    The Data processing manager" for patients in Inpatient Rehabilitation Facilities with attached "Privacy Act Statement-Health Care Records" was provided and verbally reviewed with: Patient and Family   Emergency Contact Information Contact Information       Name Relation Home Work Mobile    MORRIS,[name] Daughter     [phone]         Other Contacts       Name Relation Home Work Mobile    White,Amber Daughter     [phone]           Current Medical History  Patient Admitting Diagnosis: 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  In ED he was noted ot have leukocytosis, AKI, and tachycardia.  He was started on fluids which led to flash pulmonary edema requiring intubation.  He was started on broad spectrum ABC and blood cultures showed enterococcus faecalis.  TTE obtained and showed mitral valve vegetation.  Echo showed normal biventricular function with concern for aortic dissection.  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  He required pressors for septic shock.  CTS consulted and on 9/20 he underwent aortic valve and root replacement per Dr. Leafy Ro.  OR culture showed gram positive cocci in pairs.  Post op course complicated by persistent encephalopathy.  Neurology consulted and was discovered pt had acute ischemic infarct, embolic shower in the R PCA territory.  MRI confirmed infact in R PCA territory as well as acute infarcts in bilateral parietal lobes, cerebellum, and small vlumve acute SAH along the right frontal convexity.  He was extubated on 9/21.  He was diuresed and started on lovenox for DVT prophylaxis.  He is tolerating a regular diet.  He did require amio infusion for afib with rvr, currently on PO.  PICC line placed for long term IV abx treatment of his endocarditis per ID recommendations.  He did receive an MRI of his L hip prosthesis to confirm no infection and it was negative.  He did develop a LUE DVT on POD 7 and was started on heparin, which was discontinued on 9/27.  Therapy evaluations were completed and pt was recommended for CIR>    Patient's medical record from [facility] has been reviewed by the rehabilitation admission coordinator and physician.   Past Medical History         Past Medical History:  Diagnosis Date   Angina pectoris (HCC) Canadian classification 2 04/10/2020   Anxiety     Arthritis     Coronary artery disease coronary CT angio did not show any major issues, suspicion for distal disease 06/12/2020   Depression     DJD (degenerative joint disease)     Dyslipidemia 02/13/2020   Essential hypertension 02/13/2020   Precordial chest pain 02/13/2020           Has the patient had major surgery during 100 days prior to admission? Yes   Family History   family history includes CAD in his mother; Cancer in his child; Heart attack in his father and paternal grandfather; Kidney failure in his mother.   Current  Medications   Current Medications    Current Facility-Administered Medications:     stroke: early stages of recovery book, , Does not apply, Once, Roddenberry, Myron G, PA-C   0.9 %  sodium chloride infusion, 250 mL, Intravenous, Continuous, Roddenberry, Myron G, PA-C   0.9 %  sodium chloride infusion, , Intravenous, PRN, Lorin Glass, MD, Stopped at 04/06/23 1048   amiodarone (PACERONE) tablet 400 mg, 400 mg, Oral, BID, Eugenio Hoes, MD, 400 mg at 04/07/23 0845   ampicillin (OMNIPEN) 2 g in sodium chloride 0.9 % 100 mL IVPB, 2 g, Intravenous, Q4H, Roddenberry, Myron G, PA-C, Last Rate: 300 mL/hr at 04/07/23 0607, 2 g at 04/07/23 0607   aspirin EC tablet 81 mg, 81 mg, Oral, Daily, Eugenio Hoes, MD, 81 mg at 04/07/23 0845   atorvastatin (LIPITOR) tablet 80 mg, 80 mg, Oral, Daily, Eugenio Hoes, MD, 80 mg at 04/07/23 0845   bisacodyl (DULCOLAX) EC tablet 10 mg, 10 mg, Oral, Daily, 10 mg at 04/06/23 0948 **OR** bisacodyl (DULCOLAX) suppository 10 mg, 10 mg, Rectal, Daily, Roddenberry, Myron G, PA-C, 10 mg at 04/01/23 1021   cefTRIAXone (ROCEPHIN) 2 g in sodium chloride 0.9 % 100 mL IVPB, 2 g, Intravenous, Q12H, Roddenberry, Myron G, PA-C, Last Rate: 200 mL/hr at 04/07/23 1011, 2 g at 04/07/23 1011   Chlorhexidine Gluconate Cloth 2 % PADS 6 each, 6 each, Topical, Daily, Cheri Fowler, MD, 6 each at 04/07/23 0845   ezetimibe (ZETIA) tablet 10 mg, 10 mg, Oral, Daily, Eugenio Hoes, MD, 10 mg at 04/07/23 0844   Fe Fum-Vit C-Vit B12-FA (TRIGELS-F FORTE) capsule 1 capsule, 1 capsule, Oral, QPC breakfast, Lovett Sox, MD, 1 capsule at 04/07/23 1017   feeding supplement (ENSURE ENLIVE / ENSURE PLUS) liquid 237 mL, 237 mL, Oral, TID BM, Eugenio Hoes, MD, 237 mL at 04/07/23 0846   furosemide (LASIX) tablet 40 mg, 40 mg, Oral, Daily, Lorin Glass, MD, 40 mg at 04/07/23 0845   heparin ADULT infusion 100 units/mL (25000 units/235mL), 1,900 Units/hr, Intravenous, Continuous, Arabella Merles, RPH, Last  Rate: 19 mL/hr at 04/07/23 0512, 1,900 Units/hr at 04/07/23 0512   insulin aspart (novoLOG) injection 2-6 Units, 2-6 Units, Subcutaneous, TID AC & HS, Lovett Sox, MD   insulin detemir (LEVEMIR) injection 8 Units, 8 Units, Subcutaneous, Daily, Lorin Glass, MD, 8 Units at 04/07/23 1013   metoprolol tartrate (LOPRESSOR)  injection 2.5-5 mg, 2.5-5 mg, Intravenous, Q2H PRN, Roddenberry, Myron G, PA-C   metoprolol tartrate (LOPRESSOR) tablet 12.5 mg, 12.5 mg, Oral, BID, Eugenio Hoes, MD, 12.5 mg at 04/07/23 0844   morphine (PF) 2 MG/ML injection 1-4 mg, 1-4 mg, Intravenous, Q1H PRN, Roddenberry, Myron G, PA-C, 2 mg at 04/02/23 1934   ondansetron (ZOFRAN) injection 4 mg, 4 mg, Intravenous, Q6H PRN, Roddenberry, Myron G, PA-C, 4 mg at 04/02/23 1610   Oral care mouth rinse, 15 mL, Mouth Rinse, 4 times per day, Eugenio Hoes, MD, 15 mL at 04/07/23 0846   Oral care mouth rinse, 15 mL, Mouth Rinse, PRN, Eugenio Hoes, MD   oxyCODONE (Oxy IR/ROXICODONE) immediate release tablet 5-10 mg, 5-10 mg, Oral, Q3H PRN, Roddenberry, Myron G, PA-C, 10 mg at 04/04/23 1217   pantoprazole (PROTONIX) injection 40 mg, 40 mg, Intravenous, Daily, Silvana Newness, RPH, 40 mg at 04/07/23 0844   sodium chloride flush (NS) 0.9 % injection 10-40 mL, 10-40 mL, Intracatheter, Q12H, Eugenio Hoes, MD, 10 mL at 04/07/23 0847   sodium chloride flush (NS) 0.9 % injection 10-40 mL, 10-40 mL, Intracatheter, PRN, Eugenio Hoes, MD   sodium chloride flush (NS) 0.9 % injection 3 mL, 3 mL, Intravenous, Q12H, Roddenberry, Myron G, PA-C, 3 mL at 04/06/23 0957   sodium chloride flush (NS) 0.9 % injection 3 mL, 3 mL, Intravenous, PRN, Roddenberry, Myron G, PA-C   tamsulosin (FLOMAX) capsule 0.4 mg, 0.4 mg, Oral, BID, Roddenberry, Myron G, PA-C, 0.4 mg at 04/07/23 0845   traMADol (ULTRAM) tablet 50-100 mg, 50-100 mg, Oral, Q4H PRN, Roddenberry, Myron G, PA-C   traZODone (DESYREL) tablet 50 mg, 50 mg, Oral, QHS PRN, Eugenio Hoes, MD, 50 mg at  04/06/23 2306   Warfarin - Pharmacist Dosing Inpatient, , Does not apply, q1600, [facility], Given at 04/06/23 1716      Patients Current Diet:  Diet Order                  Diet regular Room service appropriate? Yes with Assist; Fluid consistency: Thin  Diet effective now                         Precautions / Restrictions Precautions Precautions: Sternal, Fall Precaution Booklet Issued: No Precaution Comments: pt able to recall precautions however requires constant cues to adhere to them functionally Restrictions Weight Bearing Restrictions: Yes (sternal precaution) RUE Weight Bearing: Non weight bearing LUE Weight Bearing: Non weight bearing Other Position/Activity Restrictions: chest tube    Has the patient had 2 or more falls or a fall with injury in the past year? No   Prior Activity Level Community (5-7x/wk): indep, not working full time, no DME used, driving   Prior Functional Level Self Care: Did the patient need help bathing, dressing, using the toilet or eating? Independent   Indoor Mobility: Did the patient need assistance with walking from room to room (with or without device)? Independent   Stairs: Did the patient need assistance with internal or external stairs (with or without device)? Independent   Functional Cognition: Did the patient need help planning regular tasks such as shopping or remembering to take medications? Independent   Patient Information Are you of Hispanic, Latino/a,or Spanish origin?: A. No, not of Hispanic, Latino/a, or Spanish origin What is your race?: A. White Do you need or want an interpreter to communicate with a doctor or health care staff?: 0. No   Patient's Response To:  Health Literacy and Transportation Is the patient able to respond to health literacy and transportation needs?: Yes Health Literacy - How often do you need to have someone help you when you read instructions, pamphlets, or other written material  from your doctor or pharmacy?: Never In the past 12 months, has lack of transportation kept you from medical appointments or from getting medications?: No In the past 12 months, has lack of transportation kept you from meetings, work, or from getting things needed for daily living?: No   Journalist, newspaper / Equipment Home Assistive Devices/Equipment: None   Prior Device Use: Indicate devices/aids used by the patient prior to current illness, exacerbation or injury? None of the above   Current Functional Level Cognition   Arousal/Alertness: Awake/alert Overall Cognitive Status: Impaired/Different from baseline Current Attention Level: Sustained, Selective Orientation Level: Oriented X4 Following Commands: Follows one step commands with increased time Safety/Judgement: Decreased awareness of deficits General Comments: requires increased time to process, but incremental improvement.  Pt feeling that it is all need to wake up. Attention: Sustained Sustained Attention: Appears intact Memory: Impaired Memory Impairment: Retrieval deficit (3/5 words) Awareness: Impaired Awareness Impairment: Intellectual impairment, Emergent impairment Problem Solving: Impaired Safety/Judgment: Impaired    Extremity Assessment (includes Sensation/Coordination)   Upper Extremity Assessment: Generalized weakness RUE Deficits / Details: global weakness, but WFL for basic ROM within limits of sternal prec RUE Coordination: decreased fine motor LUE Deficits / Details: global weakness compared to RUE, decr coordination during functional reach task LUE Coordination: decreased fine motor  Lower Extremity Assessment: Defer to PT evaluation     ADLs   Overall ADL's : Needs assistance/impaired Eating/Feeding: Set up, Sitting Eating/Feeding Details (indicate cue type and reason): drinking water Grooming: Minimal assistance, Sitting Grooming Details (indicate cue type and reason): cues for sequencing Upper  Body Bathing: Moderate assistance Lower Body Bathing: Maximal assistance Upper Body Dressing : Minimal assistance, Sitting Upper Body Dressing Details (indicate cue type and reason): donning gown Lower Body Dressing: Maximal assistance, Sit to/from stand Toilet Transfer: Moderate assistance, Rolling walker (2 wheels), Regular Toilet Toilet Transfer Details (indicate cue type and reason): with Programmer, applications- Clothing Manipulation and Hygiene: Sit to/from stand, Maximal assistance Functional mobility during ADLs: Moderate assistance, +2 for safety/equipment     Mobility   Overal bed mobility: Needs Assistance Bed Mobility: Sit to Supine Sidelying to sit: Min assist Supine to sit: Mod assist, +2 for physical assistance Sit to sidelying: Mod assist General bed mobility comments: pt maneuvered LE's off bed.  Assisted trunk up and forward.  LE assist to supine.  Pt able to scoot to EOB without assist.     Transfers   Overall transfer level: Needs assistance Equipment used:  (eva walker) Transfers: Sit to/from Stand Sit to Stand: Mod assist Bed to/from chair/wheelchair/BSC transfer type:: Step pivot Step pivot transfers: Mod assist General transfer comment: light mod assist for coming forward more than boost, use of heart pillow to reinforce no use of UE's     Ambulation / Gait / Stairs / Wheelchair Mobility   Ambulation/Gait Ambulation/Gait assistance: Min assist, Mod assist Gait Distance (Feet): 160 Feet Assistive device: Rolling walker (2 wheels) Gait Pattern/deviations: Step-through pattern, Decreased stride length, Narrow base of support, Drifts right/left, Scissoring General Gait Details: mild unsteadiness overall with some degradation as his involved side fatigued.  Mod assist generally with short episodes of min assist. Gait velocity: dec Gait velocity interpretation: <1.31 ft/sec, indicative of household ambulator     Posture /  Balance Dynamic Sitting Balance Sitting  balance - Comments: sits statically, tends to lean posteriorly but needs incr cues to bring chest forward, difficult to maintain Balance Overall balance assessment: Needs assistance Sitting-balance support: Feet supported Sitting balance-Leahy Scale: Fair Sitting balance - Comments: sits statically, tends to lean posteriorly but needs incr cues to bring chest forward, difficult to maintain Standing balance support: Reliant on assistive device for balance Standing balance-Leahy Scale: Poor Standing balance comment: dependent on external support     Special needs/care consideration Continuous Drip IV  ampicillin and ceftriaxone, Skin sternal incision, and Diabetic management yes    Previous Home Environment (from acute therapy documentation) Living Arrangements: Alone Available Help at Discharge: Family Type of Home: House Home Layout: One level Home Access: Ramped entrance Home Care Services: No Additional Comments: plans to go to daughter's home, reports is handicapped accessable   Discharge Living Setting Plans for Discharge Living Setting: Other (Comment) (ALF versus home with daughter) Type of Home at Discharge: House Discharge Home Layout: One level Discharge Home Access: Ramped entrance Discharge Bathroom Shower/Tub: Tub/shower unit Discharge Bathroom Toilet: Standard Discharge Bathroom Accessibility: Yes How Accessible: Accessible via walker Does the patient have any problems obtaining your medications?: No   Social/Family/Support Systems Anticipated Caregiver: daughters Hospital doctor and Bunch Anticipated Industrial/product designer Information: Hospital doctor [phone]; Chrissy [phone] Ability/Limitations of Caregiver: min assist Caregiver Availability: 24/7 Discharge Plan Discussed with Primary Caregiver: Yes Is Caregiver In Agreement with Plan?: Yes Does Caregiver/Family have Issues with Lodging/Transportation while Pt is in Rehab?: No   Goals Patient/Family Goal for Rehab:  PT/OT/SLP supervision Expected length of stay: 10-14 days Additional Information: Discharge plan: ideally will discharge directly to an ALF, however if that is not possible can d/c home with Amber until they are able to place him. Pt/Family Agrees to Admission and willing to participate: Yes Program Orientation Provided & Reviewed with Pt/Caregiver Including Roles  & Responsibilities: Yes Additional Information Needs: medicaid application pending  Barriers to Discharge: Insurance for SNF coverage, Decreased caregiver support   Decrease burden of Care through IP rehab admission: n/a   Possible need for SNF placement upon discharge:  Not anticipated. Plan for discharge directly to ALF versus home with daughter [name] if bed not available at time of discharge.     Patient Condition: I have reviewed medical records from [facility], spoken with  [facility] , and patient and daughter. I met with patient at the bedside and discussed via phone for inpatient rehabilitation assessment.  Patient will benefit from ongoing PT, OT, and SLP, can actively participate in 3 hours of therapy a day 5 days of the week, and can make measurable gains during the admission.  Patient will also benefit from the coordinated team approach during an Inpatient Acute Rehabilitation admission.  The patient will receive intensive therapy as well as Rehabilitation physician, nursing, social worker, and care management interventions.  Due to bladder management, bowel management, safety, skin/wound care, disease management, medication administration, pain management, and patient education the patient requires 24 hour a day rehabilitation nursing.  The patient is currently min assist to mod assist with mobility and basic ADLs.  Discharge setting and therapy post discharge at  home  is anticipated.  Patient has agreed to participate in the Acute Inpatient Rehabilitation Program and will admit today.   Preadmission Screen Completed By:  Stephania Fragmin, PT, DPT  04/07/2023 11:06 AM ______________________________________________________________________   Discussed status with Dr. Wynn Banker on 04/09/23  at 900 AM  and received approval  for admission today.   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 Yes Co-Morbidities requiring supervision/potential complications: Set take shock secondary to bacteremia, acute hypoxic respiratory failure, acute renal failure secondary to septic shock, metabolic encephalopathy, multifocal CVA, chronic anemia, A-fib, hyperlipidemia, hyperglycemia, history of AAA, left hip prosthesis, hypertension, CAD Due to bladder management, bowel management, safety, skin/wound care, disease management, medication administration, pain management, and patient education, does the patient require 24 hr/day rehab nursing? Yes Does the patient require coordinated care of a physician, rehab nurse, PT, OT, and SLP to address physical and functional deficits in the context of the above medical diagnosis(es)? Yes Addressing deficits in the following areas: balance, endurance, locomotion, strength, transferring, bowel/bladder control, bathing, dressing, feeding, grooming, toileting, cognition, speech, language, and psychosocial support Can the patient actively participate in an intensive therapy program of at least 3 hrs of therapy 5 days a week? Yes The potential for patient to make measurable gains while on inpatient rehab is excellent Anticipated functional outcomes upon discharge from inpatient rehab: supervision PT, supervision OT, supervision SLP Estimated rehab length of  stay to reach the above functional goals is: 10-14 Anticipated discharge destination: Home 10. Overall Rehab/Functional Prognosis: good     MD Signature: Erick Colace M.D. [facility] Fellow Am Acad of Phys Med and Rehab Diplomate Am Board of Electrodiagnostic Med Fellow Am Board of Interventional Pain

## 2023-04-09 NOTE — Progress Notes (Addendum)
9 Days Post-Op Procedure(s) (LRB): MITRAL VALVE (MV) REPLACEMENT UTILIZING SIZE 33 MOSAIC PORCINE HEART VALVE (N/A) ASCENDING AORTIC ROOT REPLACEMENT UTILIZING 27 KONECT RESILIA  AORTIC VALVE CONDUIT (N/A) TRANSESOPHAGEAL ECHOCARDIOGRAM (N/A) Subjective: Feels well, breathing is comfortable  Objective: Vital signs in last 24 hours: Temp:  [98.2 F (36.8 C)-99.8 F (37.7 C)] 98.2 F (36.8 C) (09/29 0310) Pulse Rate:  [94-101] 95 (09/29 0310) Cardiac Rhythm: Normal sinus rhythm;Heart block (09/28 2021) Resp:  [17-20] 17 (09/29 0310) BP: (101-134)/(62-86) 122/83 (09/29 0310) SpO2:  [97 %-100 %] 100 % (09/29 0310) Weight:  [117.1 kg] 117.1 kg (09/28 0954)  Hemodynamic parameters for last 24 hours:    Intake/Output from previous day: 09/28 0701 - 09/29 0700 In: 719 [P.O.:716; I.V.:3] Out: 1875 [Urine:1875] Intake/Output this shift: No intake/output data recorded.  General appearance: alert, cooperative, and no distress Heart: regular rate and rhythm Lungs: clear to auscultation bilaterally Abdomen: benign Extremities: no edema Wound: incis healing well  Lab Results: Recent Labs    04/08/23 0454 04/09/23 0440  WBC 10.9* 9.0  HGB 7.6* 7.9*  HCT 24.9* 25.7*  PLT 255 304   BMET:  Recent Labs    04/08/23 0454 04/09/23 0440  NA 135 135  K 3.7 3.8  CL 105 102  CO2 22 24  GLUCOSE 121* 112*  BUN 10 9  CREATININE 0.76 0.72  CALCIUM 8.3* 8.4*    PT/INR:  Recent Labs    04/09/23 0440  LABPROT 23.5*  INR 2.1*   ABG    Component Value Date/Time   PHART 7.371 04/01/2023 1401   HCO3 23.1 04/01/2023 1401   TCO2 24 04/01/2023 1401   ACIDBASEDEF 2.0 04/01/2023 1401   O2SAT 98 04/01/2023 1401   CBG (last 3)  Recent Labs    04/08/23 1934 04/08/23 2328 04/09/23 0350  GLUCAP 118* 133* 124*    Meds Scheduled Meds:   stroke: early stages of recovery book   Does not apply Once   amiodarone  400 mg Oral BID   aspirin EC  81 mg Oral Daily   atorvastatin  80  mg Oral Daily   Chlorhexidine Gluconate Cloth  6 each Topical Daily   enoxaparin (LOVENOX) injection  120 mg Subcutaneous Q12H   ezetimibe  10 mg Oral Daily   Fe Fum-Vit C-Vit B12-FA  1 capsule Oral QPC breakfast   feeding supplement  237 mL Oral TID BM   furosemide  40 mg Oral Daily   insulin aspart  2-6 Units Subcutaneous TID AC & HS   insulin detemir  8 Units Subcutaneous Daily   metoprolol tartrate  12.5 mg Oral BID   mouth rinse  15 mL Mouth Rinse 4 times per day   pantoprazole  40 mg Oral Daily   sodium chloride flush  10-40 mL Intracatheter Q12H   sodium chloride flush  3 mL Intravenous Q12H   tamsulosin  0.4 mg Oral BID   Warfarin - Pharmacist Dosing Inpatient   Does not apply q1600   Continuous Infusions:  sodium chloride     sodium chloride Stopped (04/06/23 1048)   ampicillin (OMNIPEN) IV 2 g (04/09/23 0656)   cefTRIAXone (ROCEPHIN)  IV 2 g (04/08/23 2045)   PRN Meds:.sodium chloride, metoprolol tartrate, morphine injection, ondansetron (ZOFRAN) IV, mouth rinse, oxyCODONE, sodium chloride flush, sodium chloride flush, traMADol, traZODone  Xrays DG Chest Port 1 View  Result Date: 04/08/2023 CLINICAL DATA:  Aortic valve [id number] S/P AVR (aortic valve replacement) [id number] EXAM: PORTABLE CHEST 1 VIEW COMPARISON:  04/05/2023 FINDINGS: Interval placement of RIGHT PICC line. Removal of RIGHT IJ line. Midline sternotomy overlies enlarged cardiac silhouette. LEFT basilar atelectasis persists. Mild central venous congestion. No pneumothorax. No acute osseous abnormality. IMPRESSION: 1. Interval placement of RIGHT PICC line. 2. Removal of RIGHT IJ line. 3. Persistent LEFT basilar atelectasis. Electronically Signed   By: Genevive Bi M.D.   On: 04/08/2023 10:31    Assessment/Plan: S/P Procedure(s) (LRB): MITRAL VALVE (MV) REPLACEMENT UTILIZING SIZE 33 MOSAIC PORCINE HEART VALVE (N/A) ASCENDING AORTIC ROOT REPLACEMENT UTILIZING 27 KONECT RESILIA  AORTIC VALVE CONDUIT  (N/A) TRANSESOPHAGEAL ECHOCARDIOGRAM (N/A) POD#9 Conts to make good overall progress 1 afeb, VSS 2 O2 sats good on RA 3 good UOP, not weighed today 4 BS- good control 5 normal renal fxn 6 H/H improving trend 7 leukocytosis resolved 8 INR 2.1- pharm dosing 9 conts same abx as outlined  10 CIR when bed available         LOS: 12 days    Rowe Clack PA-C Pager [phone] 04/09/2023  Patient seen and examined, agree with findings and plan outlined above To CIR today  Viviann Spare C. Dorris Fetch, MD [facility] [phone]

## 2023-04-09 NOTE — Progress Notes (Signed)
Mobility Specialist Progress Note:   04/09/23 1149  Mobility  Activity Ambulated with assistance in hallway  Level of Assistance Contact guard assist, steadying assist  Assistive Device Front wheel walker  Distance Ambulated (ft) 120 ft  RUE Weight Bearing NWB  LUE Weight Bearing NWB  Activity Response Tolerated well  Mobility Referral Yes  $Mobility charge 1 Mobility  Mobility Specialist Start Time (ACUTE ONLY) 1035  Mobility Specialist Stop Time (ACUTE ONLY) 1055  Mobility Specialist Time Calculation (min) (ACUTE ONLY) 20 min   Pre Mobility: 93 HR  During Mobility: 107 HR Post Mobility: 94 HR  Pt received in bed, agreeable to mobility. Denied any discomfort during ambulation, asymptomatic throughout. Pt returned to bed with call bell in reach and all needs met.   Leory Plowman  Mobility Specialist Please contact via [employer] office at [phone]

## 2023-04-09 NOTE — Progress Notes (Signed)
Inpatient Rehabilitation Admission Medication Review by a Pharmacist  A complete drug regimen review was completed for this patient to identify any potential clinically significant medication issues.  High Risk Drug Classes Is patient taking? 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/23  Opioid Yes Tramadol, oxycodone prn pain  Antiplatelet No   Hypoglycemics/insulin Yes Insulin - DM  Vasoactive Medication Yes Amiodarone, metoprolol - afib Furosemide - fluid Tamsulosin - BPH  Chemotherapy No   Other Yes Atorvastatin, ezetimibe- HLD Pantoprazole - reflux Trazodone prn sleep     Type of Medication Issue Identified Description of Issue Recommendation(s)  Drug Interaction(s) (clinically significant)     Duplicate Therapy     Allergy     No Medication Administration End Date     Incorrect Dose     Additional Drug Therapy Needed     Significant med changes from prior encounter (inform family/care partners about these prior to discharge).    Other       Clinically significant medication issues were identified that warrant physician communication and completion of prescribed/recommended actions by midnight of the next day:  No  Name of provider notified for urgent issues identified:   Provider Method of Notification:     Pharmacist comments: None  Time spent performing this drug regimen review (minutes):  20 minutes  Thank you Okey Regal, PharmD

## 2023-04-09 NOTE — H&P (Signed)
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 He was transferred to [name] for management. He was started on IV antibiotics and pressors. TEE by Dr. 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 Family reported that he had not been himself  since hip surgery, was anemic and was having issues with N/V few times a day. TCTS consulted for input on surgical intervention and patient monitored till medically stable.  He has bouts of agitation w/lethargy and MRI/MRA brain done revealing confluent R-PCA occipital infarct with acute smaller infarcts in bilateral parietal lobes and in cerebellum, small volume SAH right frontal convexity question due to septic emboli.   Dr. Renold Don consulted for input and recommended hip imaging to rule out joint infection due to ongoing pain and swelling. Due to concerns of further decompensation, he was taken to OR for MVR, AVR and ascending aortic root replacement by Dr. Leafy Ro on 03/31/23. He tolerated extubation without difficulty and post op A fib treated with Amiodarone. Fluid overload managed with diuresis. He was cleared to start regular diet and PICC placed on 09/25    .    MRI hip 09/24 was negative for septic arthritis and edema left gluteus and proximal quad musculature felt to be infectious v/s inflammatory with SQ edema laterally in pelvis and proximal thighs bilaterally. Dr. Drue Second recommends 6 weeks IV ampicillin plus ceftriaxone with EOT 11/1 and follow up  on outpatient basis for likely extension with chronic oral antibiotics.  Coumadin added on 09/25 due to persistent A fib. Lethargy has resolved. LUE dopplered today due to edema and was positive for L-internal jugular DVT as well as superficial left cephalic DVT.  PT/OT/ST hs been working with patient who was found to have cognitive deficits with left inattention, delay in processing, weakness and balance deficits with wide BOS when walking. CIR recommended due to functional decline.       Review of Systems  Constitutional:  Negative for chills and fever.  Eyes:  Positive for blurred vision (has difficulty seeing foot).  Respiratory:  Positive for cough and shortness of breath.   Cardiovascular:  Positive for chest pain.  Gastrointestinal:  Positive for diarrhea. Negative for vomiting.  Neurological:  Positive for dizziness, speech change, focal weakness (LUE hard to use) and weakness.            Past Medical History:  Diagnosis Date   Angina pectoris (HCC) Canadian classification 2 04/10/2020   Anxiety     Arthritis     Coronary artery disease coronary CT angio did not show any major issues, suspicion for distal disease 06/12/2020   Depression     DJD (degenerative joint disease)     Dyslipidemia 02/13/2020   Essential hypertension 02/13/2020   Precordial chest pain 02/13/2020               Past Surgical History:  Procedure Laterality Date   ASCENDING AORTIC ROOT REPLACEMENT N/A 03/31/2023    Procedure: ASCENDING AORTIC ROOT REPLACEMENT UTILIZING 27 KONECT  RESILIA  AORTIC VALVE CONDUIT;  Surgeon: Eugenio Hoes, MD;  Location: [facility] OR;  Service: Open Heart Surgery;  Laterality: N/A;   COLONOSCOPY W/ POLYPECTOMY       FOREARM SURGERY        MASS REMOVED   HIP ARTHROSCOPY Left     LASIK Bilateral     MITRAL VALVE REPLACEMENT N/A 03/31/2023    Procedure: MITRAL VALVE (MV) REPLACEMENT UTILIZING SIZE 33 MOSAIC PORCINE HEART VALVE;  Surgeon: Eugenio Hoes, MD;  Location: [facility] OR;   Service: Open Heart Surgery;  Laterality: N/A;   TEE WITHOUT CARDIOVERSION N/A 03/31/2023    Procedure: TRANSESOPHAGEAL ECHOCARDIOGRAM;  Surgeon: Eugenio Hoes, MD;  Location: [facility] OR;  Service: Open Heart Surgery;  Laterality: N/A;   TONSILECTOMY, ADENOIDECTOMY, BILATERAL MYRINGOTOMY AND TUBES   2011   UMBILICAL HERNIA REPAIR       VASECTOMY                   Family History  Problem Relation Age of Onset   CAD Mother     Kidney failure Mother     Heart attack Father     Heart attack Paternal Grandfather     Cancer Child          RARE BONE CANCER RESULTING IN TOTAL AMPUTATION OF LIMB          Social History:  Lives alone and was working at [employer]. He reports that he has never smoked. He has never used smokeless tobacco. He reports that he does not drink alcohol and does not use drugs.     Allergies:  Allergies  No Known Allergies             Medications Prior to Admission  Medication Sig Dispense Refill   aspirin EC 325 MG tablet Take 325 mg by mouth daily.       atenolol (TENORMIN) 25 MG tablet Take 12.5 mg by mouth daily.       atorvastatin (LIPITOR) 80 MG tablet Take 1 tablet (80 mg total) by mouth daily. 90 tablet 3   celecoxib (CELEBREX) 200 MG capsule Take 200 mg by mouth 2 (two) times daily.       diclofenac (VOLTAREN) 75 MG EC tablet Take 75 mg by mouth 2 (two) times daily.       nitroGLYCERIN (NITROSTAT) 0.4 MG SL tablet Place 1 tablet (0.4 mg total) under the tongue every 5 (five) minutes as needed for chest pain. 25 tablet 11   ondansetron (ZOFRAN-ODT) 4 MG disintegrating tablet Take 4 mg by mouth 3 (three) times daily as needed.       pantoprazole (PROTONIX) 40 MG tablet Take 40 mg by mouth daily.       tamsulosin (FLOMAX) 0.4 MG CAPS capsule Take 0.4 mg by mouth 2 (two) times daily.       ranolazine (RANEXA) 1000 MG SR tablet Take 1 tablet by mouth once daily 90 tablet 3          Home: Home Living Family/patient expects to be discharged to::  Private residence Living Arrangements: Alone Available Help at Discharge: Family Type of Home: House Home Access: Ramped entrance Home Layout: One level Additional Comments: plans to go to daughter's home, reports is handicapped accessable   Functional History: Prior Function Prior Level of Function : Independent/Modified Independent, Driving Mobility Comments: no AD ADLs Comments: ind, was not working   Functional Status:  Mobility: Bed Mobility Overal bed mobility: Needs Assistance Bed Mobility: Sit  to Supine Sidelying to sit: Min assist Supine to sit: Mod assist, +2 for physical assistance Sit to sidelying: Mod assist General bed mobility comments: pt maneuvered LE's off bed.  Assisted trunk up and forward.  LE assist to supine.  Pt able to scoot to EOB without assist. Transfers Overall transfer level: Needs assistance Equipment used:  (eva walker) Transfers: Sit to/from Stand Sit to Stand: Mod assist Bed to/from chair/wheelchair/BSC transfer type:: Step pivot Step pivot transfers: Mod assist General transfer comment: light mod assist for coming forward more than boost, use of heart pillow to reinforce no use of UE's Ambulation/Gait Ambulation/Gait assistance: Min assist, Mod assist Gait Distance (Feet): 160 Feet Assistive device: Rolling walker (2 wheels) Gait Pattern/deviations: Step-through pattern, Decreased stride length, Narrow base of support, Drifts right/left, Scissoring General Gait Details: mild unsteadiness overall with some degradation as his involved side fatigued.  Mod assist generally with short episodes of min assist. Gait velocity: dec Gait velocity interpretation: <1.31 ft/sec, indicative of household ambulator   ADL: ADL Overall ADL's : Needs assistance/impaired Eating/Feeding: Set up, Sitting Eating/Feeding Details (indicate cue type and reason): drinking water Grooming: Minimal assistance, Sitting Grooming Details (indicate cue type and reason):  cues for sequencing Upper Body Bathing: Moderate assistance Lower Body Bathing: Maximal assistance Upper Body Dressing : Minimal assistance, Sitting Upper Body Dressing Details (indicate cue type and reason): donning gown Lower Body Dressing: Maximal assistance, Sit to/from stand Toilet Transfer: Moderate assistance, Rolling walker (2 wheels), Regular Toilet Toilet Transfer Details (indicate cue type and reason): with Programmer, applications- Clothing Manipulation and Hygiene: Sit to/from stand, Maximal assistance Functional mobility during ADLs: Moderate assistance, +2 for safety/equipment   Cognition: Cognition Overall Cognitive Status: Impaired/Different from baseline Arousal/Alertness: Awake/alert Orientation Level: Oriented X4 Year: 2024 Day of Week: Correct Attention: Sustained Sustained Attention: Appears intact Memory: Impaired Memory Impairment: Retrieval deficit (3/5 words) Awareness: Impaired Awareness Impairment: Intellectual impairment, Emergent impairment Problem Solving: Impaired Safety/Judgment: Impaired Cognition Arousal: Alert Behavior During Therapy: WFL for tasks assessed/performed Overall Cognitive Status: Impaired/Different from baseline Area of Impairment: Attention, Memory, Following commands, Safety/judgement, Awareness, Problem solving, Orientation Orientation Level: Time Current Attention Level: Sustained, Selective Memory: Decreased short-term memory, Decreased recall of precautions Following Commands: Follows one step commands with increased time Safety/Judgement: Decreased awareness of deficits Awareness: Intellectual Problem Solving: Slow processing, Requires verbal cues, Difficulty sequencing, Decreased initiation General Comments: requires increased time to process, but incremental improvement.  Pt feeling that it is all need to wake up.   Physical Exam: Blood pressure 122/78, pulse 100, temperature 99.7 F (37.6 C), temperature source Oral,  resp. rate 20, weight 110.6 kg, SpO2 95%. Physical Exam Vitals and nursing note reviewed.  Constitutional:      Appearance: Normal appearance.  HENT:     Mouth/Throat:     Mouth: Mucous membranes are dry.  Pulmonary:     Comments: Increased WOB with conversation. Intermittent cough noted.  Chest:     Chest wall: Tenderness present.  Abdominal:     Palpations: Abdomen is soft.     Comments: Ecchymosis left lower abdomen  Musculoskeletal:        General: Swelling (2+ edema BLE distally) present.  Skin:    General: Skin is warm and dry.  Neurological:     General: No focal deficit present.     Mental Status: He is alert and oriented to person, place, and time.     Comments: Dysarthric speech. Blurry vision --new. He was able to answer date, city, state,  [place], DOB. Is aware that he's had bouts of disorientation. He was able to follow simple 1 and 2 step motor commands but felt asleep by end of exam.       Lab Results Last 48 Hours        Results for orders placed or performed during the hospital encounter of 03/28/23 (from the past 48 hour(s))  Glucose, capillary     Status: None    Collection Time: 04/05/23  3:55 PM  Result Value Ref Range    Glucose-Capillary 98 70 - 99 mg/dL      Comment: Glucose reference range applies only to samples taken after fasting for at least 8 hours.  Glucose, capillary     Status: Abnormal    Collection Time: 04/05/23  7:35 PM  Result Value Ref Range    Glucose-Capillary 120 (H) 70 - 99 mg/dL      Comment: Glucose reference range applies only to samples taken after fasting for at least 8 hours.  Glucose, capillary     Status: Abnormal    Collection Time: 04/05/23  7:55 PM  Result Value Ref Range    Glucose-Capillary 123 (H) 70 - 99 mg/dL      Comment: Glucose reference range applies only to samples taken after fasting for at least 8 hours.  Glucose, capillary     Status: Abnormal    Collection Time: 04/05/23 11:00 PM  Result Value Ref Range     Glucose-Capillary 132 (H) 70 - 99 mg/dL      Comment: Glucose reference range applies only to samples taken after fasting for at least 8 hours.  Protime-INR     Status: Abnormal    Collection Time: 04/06/23  4:44 AM  Result Value Ref Range    Prothrombin Time 20.9 (H) 11.4 - 15.2 seconds    INR 1.8 (H) 0.8 - 1.2      Comment: (NOTE) INR goal varies based on device and disease states. Performed at [facility], [address] [place], [place] [address]    CBC     Status: Abnormal    Collection Time: 04/06/23  4:44 AM  Result Value Ref Range    WBC 10.3 4.0 - 10.5 K/uL    RBC 2.91 (L) 4.22 - 5.81 MIL/uL    Hemoglobin 7.9 (L) 13.0 - 17.0 g/dL    HCT 60.4 (L) 54.0 - 52.0 %    MCV 91.1 80.0 - 100.0 fL    MCH 27.1 26.0 - 34.0 pg    MCHC 29.8 (L) 30.0 - 36.0 g/dL    RDW 98.1 (H) 19.1 - 15.5 %    Platelets 199 150 - 400 K/uL    nRBC 0.0 0.0 - 0.2 %      Comment: Performed at [facility], [address] [place], [place] [address]  Basic metabolic panel     Status: Abnormal    Collection Time: 04/06/23  4:44 AM  Result Value Ref Range    Sodium 140 135 - 145 mmol/L    Potassium 3.4 (L) 3.5 - 5.1 mmol/L    Chloride 103 98 - 111 mmol/L    CO2 30 22 - 32 mmol/L    Glucose, Bld 91 70 - 99 mg/dL      Comment: Glucose reference range applies only to samples taken after fasting for at least 8 hours.    BUN 12 6 - 20 mg/dL    Creatinine, Ser 5.62 0.61 - 1.24 mg/dL  Calcium 8.6 (L) 8.9 - 10.3 mg/dL    GFR, Estimated >78 >29 mL/min      Comment: (NOTE) Calculated using the CKD-EPI Creatinine Equation (2021)      Anion gap 7 5 - 15      Comment: Performed at [facility], [address], [place], [place] [address]  Glucose, capillary     Status: None    Collection Time: 04/06/23  4:48 AM  Result Value Ref Range    Glucose-Capillary 89 70 - 99 mg/dL      Comment: Glucose reference range applies only to samples taken after fasting for at least 8 hours.  Glucose,  capillary     Status: Abnormal    Collection Time: 04/06/23  7:50 AM  Result Value Ref Range    Glucose-Capillary 145 (H) 70 - 99 mg/dL      Comment: Glucose reference range applies only to samples taken after fasting for at least 8 hours.  Glucose, capillary     Status: Abnormal    Collection Time: 04/06/23 12:09 PM  Result Value Ref Range    Glucose-Capillary 147 (H) 70 - 99 mg/dL      Comment: Glucose reference range applies only to samples taken after fasting for at least 8 hours.  Glucose, capillary     Status: Abnormal    Collection Time: 04/06/23  4:53 PM  Result Value Ref Range    Glucose-Capillary 111 (H) 70 - 99 mg/dL      Comment: Glucose reference range applies only to samples taken after fasting for at least 8 hours.  Glucose, capillary     Status: Abnormal    Collection Time: 04/06/23  8:00 PM  Result Value Ref Range    Glucose-Capillary 146 (H) 70 - 99 mg/dL      Comment: Glucose reference range applies only to samples taken after fasting for at least 8 hours.    Comment 1 Notify RN      Comment 2 Document in Chart    Glucose, capillary     Status: Abnormal    Collection Time: 04/07/23 12:15 AM  Result Value Ref Range    Glucose-Capillary 110 (H) 70 - 99 mg/dL      Comment: Glucose reference range applies only to samples taken after fasting for at least 8 hours.    Comment 1 Notify RN      Comment 2 Document in Chart    Protime-INR     Status: Abnormal    Collection Time: 04/07/23  2:40 AM  Result Value Ref Range    Prothrombin Time 21.7 (H) 11.4 - 15.2 seconds    INR 1.9 (H) 0.8 - 1.2      Comment: (NOTE) INR goal varies based on device and disease states. Performed at [facility], [address] [place], [place] [address]    Basic metabolic panel     Status: Abnormal    Collection Time: 04/07/23  2:40 AM  Result Value Ref Range    Sodium 139 135 - 145 mmol/L    Potassium 3.9 3.5 - 5.1 mmol/L    Chloride 103 98 - 111 mmol/L    CO2 27 22 - 32 mmol/L     Glucose, Bld 114 (H) 70 - 99 mg/dL      Comment: Glucose reference range applies only to samples taken after fasting for at least 8 hours.    BUN 13 6 - 20 mg/dL    Creatinine, Ser 8.46  0.61 - 1.24 mg/dL    Calcium 8.4 (L) 8.9 - 10.3 mg/dL    GFR, Estimated >13 >08 mL/min      Comment: (NOTE) Calculated using the CKD-EPI Creatinine Equation (2021)      Anion gap 9 5 - 15      Comment: Performed at [name], [address], [place], [place] [address]  CBC     Status: Abnormal    Collection Time: 04/07/23  2:40 AM  Result Value Ref Range    WBC 11.2 (H) 4.0 - 10.5 K/uL    RBC 2.74 (L) 4.22 - 5.81 MIL/uL    Hemoglobin 7.6 (L) 13.0 - 17.0 g/dL    HCT 69.6 (L) 29.5 - 52.0 %    MCV 91.6 80.0 - 100.0 fL    MCH 27.7 26.0 - 34.0 pg    MCHC 30.3 30.0 - 36.0 g/dL    RDW 28.4 (H) 13.2 - 15.5 %    Platelets 225 150 - 400 K/uL    nRBC 0.0 0.0 - 0.2 %      Comment: Performed at [facility], [address], [place], [place] [address]  Heparin level (unfractionated)     Status: Abnormal    Collection Time: 04/07/23  2:40 AM  Result Value Ref Range    Heparin Unfractionated <0.10 (L) 0.30 - 0.70 IU/mL      Comment: (NOTE) The clinical reportable range upper limit is being lowered to >1.10 to align with the FDA approved guidance for the current laboratory assay.   If heparin results are below expected values, and patient dosage has  been confirmed, suggest follow up testing of antithrombin III levels. Performed at [facility], [address], [place], [place] [address]    Glucose, capillary     Status: None    Collection Time: 04/07/23  4:14 AM  Result Value Ref Range    Glucose-Capillary 98 70 - 99 mg/dL      Comment: Glucose reference range applies only to samples taken after fasting for at least 8 hours.  Glucose, capillary     Status: Abnormal    Collection Time: 04/07/23  8:02 AM  Result Value Ref Range    Glucose-Capillary 129 (H) 70 - 99 mg/dL       Comment: Glucose reference range applies only to samples taken after fasting for at least 8 hours.    Comment 1 Notify RN      Comment 2 Document in Chart    Heparin level (unfractionated)     Status: Abnormal    Collection Time: 04/07/23 10:03 AM  Result Value Ref Range    Heparin Unfractionated <0.10 (L) 0.30 - 0.70 IU/mL      Comment: (NOTE) The clinical reportable range upper limit is being lowered to >1.10 to align with the FDA approved guidance for the current laboratory assay.   If heparin results are below expected values, and patient dosage has  been confirmed, suggest follow up testing of antithrombin III levels. Performed at [facility], [address], [place], [place] [address]    Glucose, capillary     Status: Abnormal    Collection Time: 04/07/23 12:53 PM  Result Value Ref Range    Glucose-Capillary 119 (H) 70 - 99 mg/dL      Comment: Glucose reference range applies only to samples taken after fasting for at least 8 hours.    Comment 1 Notify RN  Comment 2 Document in Chart         Imaging Results (Last 48 hours)  VAS [place] UPPER EXTREMITY VENOUS DUPLEX   Result Date: 04/06/2023 UPPER VENOUS STUDY  Patient Name:  ERBY SANDERSON Follette  Date of Exam:   04/06/2023 Medical Rec #: [id number]       Accession #:    [id number] Date of Birth: March 13, 1966       Patient Gender: M Patient Age:   58 years Exam Location:  [name] Procedure:      VAS [place] UPPER EXTREMITY VENOUS DUPLEX Referring Phys: Joette Catching RODDENBERRY --------------------------------------------------------------------------------  Indications: Swelling Other Indications: Hx of central line placement and piv to LUE. Comparison Study: No previous exams Performing Technologist: Jody Hill RVT, RDMS  Examination Guidelines: A complete evaluation includes B-mode imaging, spectral Doppler, color Doppler, and power Doppler as needed of all accessible portions of each vessel. Bilateral testing is considered an integral  part of a complete examination. Limited examinations for reoccurring indications may be performed as noted.  Right Findings: +----------+------------+---------+-----------+----------+--------------------+ RIGHT     CompressiblePhasicitySpontaneousProperties      Summary        +----------+------------+---------+-----------+----------+--------------------+ Subclavian               Yes       Yes                   patent by                                                              color/doppler     +----------+------------+---------+-----------+----------+--------------------+  Left Findings: +----------+------------+---------+-----------+----------+-------+ LEFT      CompressiblePhasicitySpontaneousPropertiesSummary +----------+------------+---------+-----------+----------+-------+ IJV           None       No        No                Acute  +----------+------------+---------+-----------+----------+-------+ Subclavian    Full       Yes       Yes                      +----------+------------+---------+-----------+----------+-------+ Axillary      Full       Yes       Yes                      +----------+------------+---------+-----------+----------+-------+ Brachial      Full       Yes       Yes                      +----------+------------+---------+-----------+----------+-------+ Radial        Full                                          +----------+------------+---------+-----------+----------+-------+ Ulnar         Full                                          +----------+------------+---------+-----------+----------+-------+ Cephalic  Partial      No        No                Acute  +----------+------------+---------+-----------+----------+-------+ Basilic       Full       Yes       Yes                      +----------+------------+---------+-----------+----------+-------+  Summary:  Right: No evidence of thrombosis in the  subclavian.  Left: Findings consistent with acute deep vein thrombosis involving the left internal jugular vein. Findings consistent with acute superficial vein thrombosis involving the left cephalic vein.  *See table(s) above for measurements and observations.  Diagnosing physician: Heath Lark Electronically signed by Heath Lark on 04/06/2023 at 5:08:09 PM.    Final            Blood pressure 122/78, pulse 100, temperature 99.7 F (37.6 C), temperature source Oral, resp. rate 20, weight 110.6 kg, SpO2 95%.   Medical Problem List and Plan: 1. Functional deficits secondary to debilty after MVR/AVR 03/31/2023 due to endocarditis             -patient may  shower             -ELOS/Goals: 10-14d 2.  L-internal jugular DVT/ A fib/Antithrombotics: -DVT/anticoagulation:  Pharmaceutical:LUE cephalic and internal jugular DVT Coumadin and Heparin bridge to change to lovenox at discharge             -antiplatelet therapy: ASA 3. Pain Management:  oxycodone or tramadol prn for pain.  4. Mood/Behavior/Sleep: LCSW to follow for evaluation and support.              -antipsychotic agents: N/A             --melatonin prn for insomnia.  5. Neuropsych/cognition: This patient is capable of making decisions on his own behalf. 6. Skin/Wound Care: Routine pressure relief measures.  7. Fluids/Electrolytes/Nutrition: Strict I/O. Check CMET in am.  8. 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 Fluid overload/SOB: Lasix 40 mg decreased to daily on 09/25 --developed cough last nigh with SOB 09/27 pm.  --daily weights with monitoring for signs of overload. .  10. A fib: Continue on amiodarone 400 mg bid since 09/25-->taper? May need to contact cards for this  11. Pre diabetes/Stress induced hyperglycemia: Monitor BS ac/hs.  --Hgb A1C 6.2 (prediabetic). --diet changed to CM from  regular. Continue Levemir 8 units daily with SSI prn for tighter BS control. .  --Transition insulin to metformin as intake improves.   CBG (last 3)  Recent Labs    04/09/23 0350 04/09/23 0748 04/09/23 1151  GLUCAP 124* 102* 130*    12 ABLA:   Monitor for signs of bleeding             --transfuse prn <7.0 or if symptomatic.     Latest Ref Rng & Units 04/09/2023    4:40 AM 04/08/2023    4:54 AM 04/07/2023    2:40 AM  CBC  WBC 4.0 - 10.5 K/uL 9.0  10.9  11.2   Hemoglobin 13.0 - 17.0 g/dL 7.9  7.6  7.6   Hematocrit 39.0 - 52.0 % 25.7  24.9  25.1   Platelets 150 - 400 K/uL 304  255  225     13. Constipation: Resolved and now w/diarrhea. Will hold laxative.     Jacquelynn Cree, PA-C 04/07/2023

## 2023-04-09 NOTE — Evaluation (Signed)
Occupational Therapy Assessment and Plan  Patient Details  Name: Dale Mills MRN: [id number] Date of Birth: 19-Jan-1966  OT Diagnosis: {diagnoses:3041644} Rehab Potential:   ELOS:     {CHL IP REHAB OT TIME CALCULATIONS:304400400}    Hospital Problem: Principal Problem:   Acute ischemic left posterior cerebral artery (PCA) stroke (HCC)   Past Medical History:  Past Medical History:  Diagnosis Date   Angina pectoris (HCC) Canadian classification 2 04/10/2020   Anxiety    Arthritis    Coronary artery disease coronary CT angio did not show any major issues, suspicion for distal disease 06/12/2020   Depression    DJD (degenerative joint disease)    Dyslipidemia 02/13/2020   Essential hypertension 02/13/2020   Precordial chest pain 02/13/2020   Past Surgical History:  Past Surgical History:  Procedure Laterality Date   ASCENDING AORTIC ROOT REPLACEMENT N/A 03/31/2023   Procedure: ASCENDING AORTIC ROOT REPLACEMENT UTILIZING 27 KONECT RESILIA  AORTIC VALVE CONDUIT;  Surgeon: Eugenio Hoes, MD;  Location: [facility] OR;  Service: Open Heart Surgery;  Laterality: N/A;   COLONOSCOPY W/ POLYPECTOMY     FOREARM SURGERY     MASS REMOVED   HIP ARTHROSCOPY Left    LASIK Bilateral    MITRAL VALVE REPLACEMENT N/A 03/31/2023   Procedure: MITRAL VALVE (MV) REPLACEMENT UTILIZING SIZE 33 MOSAIC PORCINE HEART VALVE;  Surgeon: Eugenio Hoes, MD;  Location: [facility] OR;  Service: Open Heart Surgery;  Laterality: N/A;   TEE WITHOUT CARDIOVERSION N/A 03/31/2023   Procedure: TRANSESOPHAGEAL ECHOCARDIOGRAM;  Surgeon: Eugenio Hoes, MD;  Location: [name] OR;  Service: Open Heart Surgery;  Laterality: N/A;   TONSILECTOMY, ADENOIDECTOMY, BILATERAL MYRINGOTOMY AND TUBES  2011   UMBILICAL HERNIA REPAIR     VASECTOMY      Assessment & Plan Clinical Impression: Patient is a 57 year old LH-male with history of HTN, non-obst CAD, THR 4/24, recurrent UTI with pyelonephritis in the past who was admitted via [facility] on 03/28/23 with  N/V and malaise due to E fecalis  bacteremia, 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 He was transferred to [facility] for management. He was started on IV antibiotics and pressors. TEE by Dr. 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 MRI/MRA brain done revealing confluent R-PCA occipital infarct with acute smaller infarcts in bilateral parietal lobes and in cerebellum, small volume SAH right frontal convexity question due to septic emboli.  Patient transferred to CIR on 04/09/2023 .    Patient currently requires {WUJ:8119147} with {WGN:5621308} secondary to {MVHQIONGEXB:2841324}.  Prior to hospitalization, patient could complete *** with {MWN:0272536}.  Patient will benefit from skilled intervention to {benefit of skilled intervention:3041641} prior to discharge {discharge:3041642}.  Anticipate patient will require {supervision/assistance:22779} and {follow UY:4034742}.      OT Evaluation Precautions/Restrictions    Pain   Home Living/Prior Functioning   Vision   Perception    Praxis   Cognition   Sensation   Motor     Trunk/Postural Assessment     Balance   Extremity/Trunk Assessment      Care Tool Care Tool Self Care Eating        Oral Care         Bathing              Upper Body Dressing(including orthotics)  Lower Body Dressing (excluding footwear)          Putting on/Taking off footwear             Care Tool Toileting Toileting activity         Care Tool Bed Mobility Roll left and right activity        Sit to lying activity        Lying to sitting on side of bed activity         Care Tool Transfers Sit to stand transfer        Chair/bed transfer         Toilet transfer         Care Tool Cognition  Expression  of Ideas and Wants    Understanding Verbal and Non-Verbal Content     Memory/Recall Ability     Refer to Care Plan for Long Term Goals  SHORT TERM GOAL WEEK 1    Recommendations for other services: {RECOMMENDATIONS FOR OTHER SERVICES:3049016}   Skilled Therapeutic Intervention ADL   Mobility    Skilled Intervention   Discharge Criteria: Patient will be discharged from OT if patient refuses treatment 3 consecutive times without medical reason, if treatment goals not met, if there is a change in medical status, if patient makes no progress towards goals or if patient is discharged from hospital.  The above assessment, treatment plan, treatment alternatives and goals were discussed and mutually agreed upon: {Assessment/Treatment Plan Discussed/Agreed:3049017}   Limmie Patricia, OTR/L,CBIS  Supplemental OT - [facility] Secure Chat Preferred   04/09/2023, 10:25 PM

## 2023-04-10 DIAGNOSIS — R5381 Other malaise: Principal | ICD-10-CM

## 2023-04-10 DIAGNOSIS — I4891 Unspecified atrial fibrillation: Secondary | ICD-10-CM

## 2023-04-10 DIAGNOSIS — E871 Hypo-osmolality and hyponatremia: Secondary | ICD-10-CM

## 2023-04-10 DIAGNOSIS — R7303 Prediabetes: Secondary | ICD-10-CM

## 2023-04-10 DIAGNOSIS — D62 Acute posthemorrhagic anemia: Secondary | ICD-10-CM

## 2023-04-10 DIAGNOSIS — K5900 Constipation, unspecified: Secondary | ICD-10-CM

## 2023-04-10 HISTORY — DX: Other malaise: R53.81

## 2023-04-10 LAB — BASIC METABOLIC PANEL
Anion gap: 8 (ref 5–15)
BUN: 8 mg/dL (ref 6–20)
CO2: 22 mmol/L (ref 22–32)
Calcium: 8.6 mg/dL — ABNORMAL LOW (ref 8.9–10.3)
Chloride: 102 mmol/L (ref 98–111)
Creatinine, Ser: 0.88 mg/dL (ref 0.61–1.24)
GFR, Estimated: >60 mL/min (ref >60)
Glucose, Bld: 129 mg/dL — ABNORMAL HIGH (ref 70–99)
Potassium: 3.5 mmol/L (ref 3.5–5.1)
Sodium: 132 mmol/L — ABNORMAL LOW (ref 135–145)

## 2023-04-10 LAB — SEDIMENTATION RATE: Sed Rate: 88 mm/h — ABNORMAL HIGH (ref 0–16)

## 2023-04-10 LAB — GLUCOSE, CAPILLARY
Glucose-Capillary: 113 mg/dL — ABNORMAL HIGH (ref 70–99)
Glucose-Capillary: 104 mg/dL — ABNORMAL HIGH (ref 70–99)
Glucose-Capillary: 99 mg/dL (ref 70–99)
Glucose-Capillary: 97 mg/dL (ref 70–99)

## 2023-04-10 LAB — PROTIME-INR
INR: 2.2 — ABNORMAL HIGH (ref 0.8–1.2)
Prothrombin Time: 24.7 s — ABNORMAL HIGH (ref 11.4–15.2)

## 2023-04-10 LAB — C-REACTIVE PROTEIN: CRP: 6.9 mg/dL — ABNORMAL HIGH (ref <1.0)

## 2023-04-10 MED ORDER — MELATONIN 3 MG PO TABS: 3.0000 mg | ORAL_TABLET | Freq: Every evening | ORAL | Status: DC | PRN

## 2023-04-10 MED ORDER — TRAZODONE HCL 50 MG PO TABS
50.0000 mg | ORAL_TABLET | Freq: Every day | ORAL | Status: DC
  Administered 2023-04-10 – 2023-04-25 (×16): 50 mg via ORAL
  Filled 2023-04-10 (×16): qty 1

## 2023-04-10 MED ORDER — WARFARIN SODIUM 4 MG PO TABS
4.0000 mg | ORAL_TABLET | Freq: Once | ORAL | Status: AC
  Administered 2023-04-10: 4 mg via ORAL
  Filled 2023-04-10: qty 1

## 2023-04-10 MED ORDER — INSULIN DETEMIR 100 UNIT/ML ~~LOC~~ SOLN
6.0000 [IU] | Freq: Every day | SUBCUTANEOUS | Status: DC
  Administered 2023-04-11 – 2023-04-24 (×14): 6 [IU] via SUBCUTANEOUS
  Filled 2023-04-10 (×15): qty 0.06

## 2023-04-10 MED ORDER — SODIUM CHLORIDE 0.9% FLUSH
10.0000 mL | Freq: Two times a day (BID) | INTRAVENOUS | Status: DC
  Administered 2023-04-10 – 2023-04-13 (×5): 10 mL
  Administered 2023-04-14: 20 mL
  Administered 2023-04-17 – 2023-04-18 (×3): 10 mL

## 2023-04-10 MED ORDER — CHLORHEXIDINE GLUCONATE CLOTH 2 % EX PADS
6.0000 | MEDICATED_PAD | Freq: Two times a day (BID) | CUTANEOUS | Status: DC
  Administered 2023-04-10 – 2023-04-26 (×32): 6 via TOPICAL

## 2023-04-10 MED ORDER — SODIUM CHLORIDE 0.9% FLUSH
10.0000 mL | INTRAVENOUS | Status: DC | PRN
  Administered 2023-04-18: 10 mL

## 2023-04-10 MED ORDER — WARFARIN SODIUM 2.5 MG PO TABS: 5.0000 mg | ORAL_TABLET | Freq: Once | ORAL | Status: DC

## 2023-04-10 MED ORDER — WARFARIN SODIUM 4 MG PO TABS: 4.0000 mg | ORAL_TABLET | Freq: Once | ORAL | Status: DC

## 2023-04-10 NOTE — Progress Notes (Signed)
Awake all night. Denies pain. Spoke with daughters at [facility], they report long history of insomnia. PRN trazodone given at 2151, not effective. Daughters requesting follow up head CT. They feel like right eye drooping more over past couple days along with intermittent slurred speech. 2 + pitting edema to RLE and 1+ pitting edema to LLE. Adamant about using purewick at HS. I told him he can use it tonight, but Will need to get up to BR or use urinal. Dale Mills A

## 2023-04-10 NOTE — Evaluation (Signed)
Speech Language Pathology Assessment and Plan  Patient Details  Name: Dale Mills MRN: [id number] Date of Birth: [birth year]  SLP Diagnosis: Dysarthria;Cognitive Impairments  Rehab Potential: Good ELOS: 10-14 days    Today's Date: 04/10/2023 SLP Individual Time:  -      Hospital Problem: Principal Problem:   Debility Active Problems:   Acute ischemic left posterior cerebral artery (PCA) stroke (HCC)  Past Medical History:  Past Medical History:  Diagnosis Date   Angina pectoris (HCC) Canadian classification 2 04/10/2020   Anxiety    Arthritis    Coronary artery disease coronary CT angio did not show any major issues, suspicion for distal disease 06/12/2020   Depression    DJD (degenerative joint disease)    Dyslipidemia 02/13/2020   Essential hypertension 02/13/2020   Precordial chest pain 02/13/2020   Past Surgical History:  Past Surgical History:  Procedure Laterality Date   ASCENDING AORTIC ROOT REPLACEMENT N/A 03/31/2023   Procedure: ASCENDING AORTIC ROOT REPLACEMENT UTILIZING 27 KONECT RESILIA  AORTIC VALVE CONDUIT;  Surgeon: Eugenio Hoes, MD;  [facility]: [facility];  Service: Open Heart Surgery;  Laterality: N/A;   COLONOSCOPY W/ POLYPECTOMY     FOREARM SURGERY     MASS REMOVED   HIP ARTHROSCOPY Left    LASIK Bilateral    MITRAL VALVE REPLACEMENT N/A 03/31/2023   Procedure: MITRAL VALVE (MV) REPLACEMENT UTILIZING SIZE 33 MOSAIC PORCINE HEART VALVE;  Surgeon: Eugenio Hoes, MD;  Location: [facility] OR;  Service: Open Heart Surgery;  Laterality: N/A;   TEE WITHOUT CARDIOVERSION N/A 03/31/2023   Procedure: TRANSESOPHAGEAL ECHOCARDIOGRAM;  Surgeon: Eugenio Hoes, MD;  Location: [facility] OR;  Service: Open Heart Surgery;  Laterality: N/A;   TONSILECTOMY, ADENOIDECTOMY, BILATERAL MYRINGOTOMY AND TUBES  2011   UMBILICAL HERNIA REPAIR     VASECTOMY      Assessment / Plan / Recommendation Clinical Impression  Dale Mills is a 57 year old LH-male with history of HTN, non-obst CAD,  THR 4/24, recurrent UTI with pyelonephritis in the past who was admitted via [facility] on 03/28/23 with N/V and malaise due to E fecalis  bacteremia, 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 He was transferred to [facility] for management. He was started on IV antibiotics and pressors. TEE by Dr. 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 Family reported that he had not been himself  since hip surgery, was anemic and was having issues with N/V few times a day. TCTS consulted for input on surgical intervention and patient monitored till medically stable.  He has bouts of agitation w/lethargy and MRI/MRA brain done revealing confluent R-PCA occipital infarct with acute smaller infarcts in bilateral parietal lobes and in cerebellum, small volume SAH right frontal convexity question due to septic emboli. He tolerated extubation without difficulty and post op A fib treated with Amiodarone.  He was cleared to start regular diet and PICC placed on 09/25. PT/OT/ST hs been working with patient who was found to have cognitive deficits with left inattention, delay in processing, weakness and balance deficits with wide BOS when walking. CIR recommended due to functional decline. Pt was admitted to CIR on 04/09/23.  COGNISTAT administered and revealed: mild impairments in attention and memory and severe impairments in calculations and constructional ability. Per COGNISTAT pt demo scores WNL for reasoning and executive function skills. Informally, pt with deficits  in left inattention and deficits in safety awareness. Pt was able to indep identify call button and examples of utilization.   Pt also presents with mild to moderate flaccid dysarthria. Dysarthria c/b  imprecise articulation, hypernasality and overall speech intelligibility  ~90% with words and 75% with phrases and sentences.   Bedside swallow evaluation completed with regular solids and thin liquids. Pt demonstrated adequate manipulation, transit, and deglutition. He was without s/sx pen/asp. SLP recommending continuation of regular solids and thin liquids.   Pt would benefit from skilled SLP services to maximize dysarthria and cognitive communication in order to maximize his independence prior to discharge. Anticipate pt will not need f/u SLP services at discharge.   Skilled Therapeutic Interventions          BSE, informal assessment measures, and COGNISTAT administered. Please see full report for additional details.     SLP Assessment  Patient will need skilled Speech Lanaguage Pathology Services during CIR admission    Recommendations  SLP Diet Recommendations: Age appropriate regular solids;Thin Liquid Administration via: Straw;Cup Medication Administration: Whole meds with liquid Supervision: Patient able to self feed Compensations: Slow rate;Small sips/bites Postural Changes and/or Swallow Maneuvers: Seated upright 90 degrees Oral Care Recommendations: Oral care BID Patient destination: Assisted Living Follow up Recommendations: None Equipment Recommended: None recommended by SLP    SLP Frequency 3 to 5 out of 7 days   SLP Duration  SLP Intensity  SLP Treatment/Interventions 10-14 days  Minumum of 1-2 x/day, 30 to 90 minutes  Cognitive remediation/compensation;Internal/external aids;Speech/Language facilitation;Cueing hierarchy;Oral motor exercises;Therapeutic Activities;Patient/family education;Multimodal communication approach;Functional tasks;Therapeutic Exercise    Pain Pain Assessment Pain Scale: 0-10 Pain Score: 2  Pain Type: Acute pain Pain Location: Shoulder Pain Orientation: Left Pain Descriptors / Indicators: Discomfort Pain Intervention(s): Distraction  Prior Functioning Cognitive/Linguistic Baseline: Within functional  limits Type of Home: House  Lives With: Spouse Available Help at Discharge: Other (Comment) (plan to discharge to an ALF)  SLP Evaluation Cognition Overall Cognitive Status: Impaired/Different from baseline Arousal/Alertness: Awake/alert Orientation Level: Oriented X4 Year: 2024 Month: September Day of Week: Correct Attention: Sustained;Selective Sustained Attention: Appears intact Selective Attention: Impaired Selective Attention Impairment: Verbal basic;Functional basic Memory: Impaired Memory Impairment: Retrieval deficit;Decreased recall of new information Awareness: Appears intact Awareness Impairment: Intellectual impairment;Emergent impairment Problem Solving: Impaired Problem Solving Impairment: Verbal basic;Functional basic Executive Function: Reasoning;Organizing;Sequencing Reasoning: Impaired Reasoning Impairment: Verbal basic;Functional basic Sequencing: Impaired Sequencing Impairment: Verbal basic;Functional basic Organizing: Impaired Organizing Impairment: Verbal basic;Functional basic Safety/Judgment: Impaired  Comprehension Auditory Comprehension Overall Auditory Comprehension: Appears within functional limits for tasks assessed Visual Recognition/Discrimination Discrimination: Not tested Expression Expression Primary Mode of Expression: Verbal Verbal Expression Overall Verbal Expression: Appears within functional limits for tasks assessed Initiation: No impairment Level of Generative/Spontaneous Verbalization: Conversation Naming: No impairment Divergent: Not tested Pragmatics: No impairment Written Expression Dominant Hand: Left Written Expression: Not tested Oral Motor Oral Motor/Sensory Function Overall Oral Motor/Sensory Function: Mild impairment Facial ROM: Reduced left;Suspected CN VII (facial) dysfunction Facial Symmetry: Abnormal symmetry left;Suspected CN VII (facial) dysfunction Facial Sensation: Within Functional Limits Lingual ROM:  Within Functional Limits Lingual Symmetry: Abnormal symmetry right;Suspected CN XII (hypoglossal) dysfunction Lingual Strength: Reduced Motor Speech Overall Motor Speech: Impaired Respiration: Within functional limits Phonation: Normal Resonance: Hypernasality Articulation: Impaired Level of Impairment: Sentence Intelligibility: Intelligible Motor Planning: Witnin functional limits Motor Speech Errors: Not applicable Effective Techniques: Slow rate;Over-articulate  Care Tool Care Tool Cognition Ability to hear (with hearing aid or hearing appliances if normally used Ability to hear (with hearing aid or  hearing appliances if normally used): 0. Adequate - no difficulty in normal conservation, social interaction, listening to TV   Expression of Ideas and Wants Expression of Ideas and Wants: 2. Frequent difficulty - frequently exhibits difficulty with expressing needs and ideas   Understanding Verbal and Non-Verbal Content Understanding Verbal and Non-Verbal Content: 3. Usually understands - understands most conversations, but misses some part/intent of message. Requires cues at times to understand  Memory/Recall Ability Memory/Recall Ability : Current season;Staff names and faces;That he or she is in a hospital/hospital unit   Bedside Swallowing Assessment General Date of Onset: 03/28/23 Diet Prior to this Study: Regular;Thin liquids (Level 0) Temperature Spikes Noted: No Respiratory Status: Room air History of Recent Intubation: Yes Total duration of intubation (days): 4 days Date extubated: 04/01/23 Behavior/Cognition: Alert;Cooperative;Pleasant mood Oral Cavity - Dentition: Adequate natural dentition Patient Positioning: Upright in bed Volitional Cough: Strong Volitional Swallow: Able to elicit  Oral Care Assessment Oral Assessment  (WDL): Exceptions to WDL Lips: Symmetrical Teeth: Intact Tongue: Pink;Moist Mucous Membrane(s): Moist;Pink Saliva: Moist, saliva free  flowing Level of Consciousness: Alert Is patient on any of following O2 devices?: None of the above Nutritional status: No high risk factors Oral Assessment Risk : Low Risk Ice Chips Ice chips: Not tested Thin Liquid Thin Liquid: Within functional limits Nectar Thick Nectar Thick Liquid: Not tested Honey Thick Honey Thick Liquid: Not tested Puree Puree: Not tested Solid Solid: Within functional limits BSE Assessment Risk for Aspiration Impact on safety and function: No limitations  Short Term Goals: Week 1: SLP Short Term Goal 1 (Week 1): Patient will attend to left visual field during functional tasks with Min verbal cues SLP Short Term Goal 2 (Week 1): Patient will utilize speech intelligibility strategies at the sentence level to achieve 100% intelligibility with Min verbal cues. SLP Short Term Goal 3 (Week 1): Patient will demonstrate selective attention to tasks in a mildly distracting enviornment for 15 minutes with Min verbal cues for redirection. SLP Short Term Goal 4 (Week 1): Patient will recall novel information with 80% acc given min A SLP Short Term Goal 5 (Week 1): Patient will demonstrate ability to problem solve in mildly complex functional situations with min multimodal A  Refer to Care Plan for Long Term Goals  Recommendations for other services: Neuropsych  Discharge Criteria: Patient will be discharged from SLP if patient refuses treatment 3 consecutive times without medical reason, if treatment goals not met, if there is a change in medical status, if patient makes no progress towards goals or if patient is discharged from hospital.  The above assessment, treatment plan, treatment alternatives and goals were discussed and mutually agreed upon: by patient  Renaee Munda 04/10/2023, 4:38 PM

## 2023-04-10 NOTE — Progress Notes (Signed)
Inpatient Rehabilitation Care Coordinator Assessment and Plan Patient Details  Name: Dale Mills MRN: [id number] Date of Birth: 15-Jun-1966  Today's Date: 04/10/2023  Hospital Problems: Principal Problem:   Acute ischemic left posterior cerebral artery (PCA) stroke [facility])  Past Medical History:  Past Medical History:  Diagnosis Date   Angina pectoris (HCC) [place] classification 2 04/10/2020   Anxiety    Arthritis    Coronary artery disease coronary CT angio did not show any major issues, suspicion for distal disease 06/12/2020   Depression    DJD (degenerative joint disease)    Dyslipidemia 02/13/2020   Essential hypertension 02/13/2020   Precordial chest pain 02/13/2020   Past Surgical History:  Past Surgical History:  Procedure Laterality Date   ASCENDING AORTIC ROOT REPLACEMENT N/A 03/31/2023   Procedure: ASCENDING AORTIC ROOT REPLACEMENT UTILIZING 27 KONECT RESILIA  AORTIC VALVE CONDUIT;  Surgeon: Eugenio Hoes, MD;  Location: [facility] OR;  Service: Open Heart Surgery;  Laterality: N/A;   COLONOSCOPY W/ POLYPECTOMY     FOREARM SURGERY     MASS REMOVED   HIP ARTHROSCOPY Left    LASIK Bilateral    MITRAL VALVE REPLACEMENT N/A 03/31/2023   Procedure: MITRAL VALVE (MV) REPLACEMENT UTILIZING SIZE 33 MOSAIC PORCINE HEART VALVE;  Surgeon: Eugenio Hoes, MD;  Location: [facility] OR;  Service: Open Heart Surgery;  Laterality: N/A;   TEE WITHOUT CARDIOVERSION N/A 03/31/2023   Procedure: TRANSESOPHAGEAL ECHOCARDIOGRAM;  Surgeon: Eugenio Hoes, MD;  Location: [facility] OR;  Service: Open Heart Surgery;  Laterality: N/A;   TONSILECTOMY, ADENOIDECTOMY, BILATERAL MYRINGOTOMY AND TUBES  2011   UMBILICAL HERNIA REPAIR     VASECTOMY     Social History:  reports that he has never smoked. He has never used smokeless tobacco. He reports that he does not drink alcohol and does not use drugs.  Family / Support Systems Marital Status: Single Patient Roles: Parent Spouse/Significant Other: N/A Children: Corporate investment banker Other Supports: N/A Anticipated Caregiver: Daughter, Furniture conservator/restorer Ability/Limitations of Caregiver: Min A Caregiver Availability: 24/7 Family Dynamics: Support fro daughters  Social History Preferred language: English Religion: Christ And Surveyor, quantity Cultural Background: independent overall Education: Some Charity fundraiser - How often do you need to have someone help you when you read instructions, pamphlets, or other written material from your doctor or pharmacy?: Rarely Writes: Yes Employment Status: Unemployed Date Retired/Disabled/Unemployed: n/a Marine scientist Issues: n/a Guardian/Conservator: n/a   Abuse/Neglect Abuse/Neglect Assessment Can Be Completed: Yes Physical Abuse: Denies Verbal Abuse: Denies Sexual Abuse: Denies Exploitation of patient/patient's resources: Denies Self-Neglect: Denies  Patient response to: Social Isolation - How often do you feel lonely or isolated from those around you?: Never  Emotional Status Pt's affect, behavior and adjustment status: Patient pleasant Recent Psychosocial Issues: Coping Psychiatric History: hx of anxiety Substance Abuse History: b/a  Patient / Family Perceptions, Expectations & Goals Pt/Family understanding of illness & functional limitations: yes Premorbid pt/family roles/activities: Independent overall and driving. Patient was not working prior. Anticipated changes in roles/activities/participation: Patient anticicpates assistance from his daughters at discharge Pt/family expectations/goals: supervision  [employer]: None Premorbid Home Care/DME Agencies: None Transportation available at discharge: daughters Is the patient able to respond to transportation needs?: Yes In the past 12 months, has lack of transportation kept you from medical appointments or from getting medications?: No In the past 12 months, has lack of transportation kept you from  meetings, work, or from getting things needed for daily living?: No  Discharge Planning Living  Arrangements: Alone Support Systems: Children Type of Residence: Private residence (1 level, ramped entrance) Insurance Resources: Customer service manager Resources: Family Support Financial Screen Referred: Yes Living Expenses: Rent Money Management: Patient, Family Does the patient have any problems obtaining your medications?: No Home Management: Independent Patient/Family Preliminary Plans: Patient anticipating assistance from daughters. Care Coordinator Barriers to Discharge: Lack of/limited family support, Insurance for SNF coverage, Decreased caregiver support Care Coordinator Barriers to Discharge Comments: Patient uninsured! Care Coordinator Anticipated Follow Up Needs: HH/OP DC Planning Additional Notes/Comments: Pt and daughters debating plan to go to daughter's home, reports is handicapped accessable VS. ALF (patient uninsured) Expected length of stay: 10-14 Days  Clinical Impression Sw met with patient, introduced self and explained role. Patient anticipates discharging home with his daughter or attempted ALF placement. Patient currently uninsured. Sw will receive updates and discuss options with patient and family.   Daughter's home handicapped accessible. 1 level home, ramped entrance. No additional questions or concerns. Sw will follow up with pt and daughters after conference on Wednesday.   Andria Rhein 04/10/2023, 12:49 PM

## 2023-04-10 NOTE — Progress Notes (Signed)
PMR Admission Coordinator Pre-Admission Assessment   Patient: Dale Mills is an 57 y.o., male MRN: [id number] DOB: 08-05-1965 Height:   Weight: 110.6 kg (done by [employer])   [employer] HMO:     PPO:      PCP:      IPA:      80/20:      OTHER:  PRIMARY: uninsured      Policy#:       Subscriber:  CM Name:       Phone#:      Fax#:  Pre-Cert#:       Employer:  Benefits:  Phone #:      Name:  Eff. Date:      Deduct:       Out of Pocket Max:       Life Max:  CIR:       SNF:  Outpatient:      Co-Pay:  Home Health:       Co-Pay:  DME:      Co-Pay:  Providers:  SECONDARY:       Policy#:      Phone#:    Artist:       Phone#:    The Data processing manager" for patients in Inpatient Rehabilitation Facilities with attached "Privacy Act Statement-Health Care Records" was provided and verbally reviewed with: Patient and Family   Emergency Contact Information Contact Information       Name Relation Home Work Mobile    MORRIS,[name] Daughter     [phone]         Other Contacts       Name Relation Home Work Mobile    White,Amber Daughter     [phone]           Current Medical History  Patient Admitting Diagnosis: 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  In ED he was noted ot have leukocytosis, AKI, and tachycardia.  He was started on fluids which led to flash pulmonary edema requiring intubation.  He was started on broad spectrum ABC and blood cultures showed enterococcus faecalis.  TTE obtained and showed mitral valve vegetation.  Echo showed normal biventricular function with concern for aortic dissection.  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  He required pressors for septic shock.  CTS consulted and on 9/20 he underwent aortic valve and root replacement per Dr. Leafy Ro.  OR culture showed gram positive cocci in pairs.  Post op course complicated by persistent encephalopathy.  Neurology consulted and was discovered pt had acute ischemic infarct, embolic shower in the R PCA territory.  MRI confirmed infact in R PCA territory as well as acute infarcts in bilateral parietal lobes, cerebellum, and small vlumve acute SAH along the right frontal convexity.  He was extubated on 9/21.  He was diuresed and started on lovenox for DVT prophylaxis.  He is tolerating a regular diet.  He did require amio infusion for afib with rvr, currently on PO.  PICC line placed for long term IV abx treatment of his endocarditis per ID recommendations.  He did receive an MRI of his L hip prosthesis to confirm no infection and it was negative.  He did develop a LUE DVT on POD 7 and was started on heparin, which was discontinued on 9/27.  Therapy evaluations were completed and pt was recommended for CIR>    Patient's medical record from [facility] has been reviewed by the rehabilitation admission coordinator and physician.   Past Medical History         Past Medical History:  Diagnosis Date   Angina pectoris (HCC) Canadian classification 2 04/10/2020   Anxiety     Arthritis     Coronary artery disease coronary CT angio did not show any major issues, suspicion for distal disease 06/12/2020   Depression     DJD (degenerative joint disease)     Dyslipidemia 02/13/2020   Essential hypertension 02/13/2020   Precordial chest pain 02/13/2020           Has the patient had major surgery during 100 days prior to admission? Yes   Family History   family history includes CAD in his mother; Cancer in his child; Heart attack in his father and paternal grandfather; Kidney failure in his mother.   Current  Medications   Current Medications    Current Facility-Administered Medications:     stroke: early stages of recovery book, , Does not apply, Once, Roddenberry, Myron G, PA-C   0.9 %  sodium chloride infusion, 250 mL, Intravenous, Continuous, Roddenberry, Myron G, PA-C   0.9 %  sodium chloride infusion, , Intravenous, PRN, Lorin Glass, MD, Stopped at 04/06/23 1048   amiodarone (PACERONE) tablet 400 mg, 400 mg, Oral, BID, Eugenio Hoes, MD, 400 mg at 04/07/23 0845   ampicillin (OMNIPEN) 2 g in sodium chloride 0.9 % 100 mL IVPB, 2 g, Intravenous, Q4H, Roddenberry, Myron G, PA-C, Last Rate: 300 mL/hr at 04/07/23 0607, 2 g at 04/07/23 0607   aspirin EC tablet 81 mg, 81 mg, Oral, Daily, Eugenio Hoes, MD, 81 mg at 04/07/23 0845   atorvastatin (LIPITOR) tablet 80 mg, 80 mg, Oral, Daily, Eugenio Hoes, MD, 80 mg at 04/07/23 0845   bisacodyl (DULCOLAX) EC tablet 10 mg, 10 mg, Oral, Daily, 10 mg at 04/06/23 0948 **OR** bisacodyl (DULCOLAX) suppository 10 mg, 10 mg, Rectal, Daily, Roddenberry, Myron G, PA-C, 10 mg at 04/01/23 1021   cefTRIAXone (ROCEPHIN) 2 g in sodium chloride 0.9 % 100 mL IVPB, 2 g, Intravenous, Q12H, Roddenberry, Myron G, PA-C, Last Rate: 200 mL/hr at 04/07/23 1011, 2 g at 04/07/23 1011   Chlorhexidine Gluconate Cloth 2 % PADS 6 each, 6 each, Topical, Daily, Cheri Fowler, MD, 6 each at 04/07/23 0845   ezetimibe (ZETIA) tablet 10 mg, 10 mg, Oral, Daily, Eugenio Hoes, MD, 10 mg at 04/07/23 0844   Fe Fum-Vit C-Vit B12-FA (TRIGELS-F FORTE) capsule 1 capsule, 1 capsule, Oral, QPC breakfast, Lovett Sox, MD, 1 capsule at 04/07/23 1017   feeding supplement (ENSURE ENLIVE / ENSURE PLUS) liquid 237 mL, 237 mL, Oral, TID BM, Eugenio Hoes, MD, 237 mL at 04/07/23 0846   furosemide (LASIX) tablet 40 mg, 40 mg, Oral, Daily, Lorin Glass, MD, 40 mg at 04/07/23 0845   heparin ADULT infusion 100 units/mL (25000 units/235mL), 1,900 Units/hr, Intravenous, Continuous, Arabella Merles, RPH, Last  Rate: 19 mL/hr at 04/07/23 0512, 1,900 Units/hr at 04/07/23 0512   insulin aspart (novoLOG) injection 2-6 Units, 2-6 Units, Subcutaneous, TID AC & HS, Lovett Sox, MD   insulin detemir (LEVEMIR) injection 8 Units, 8 Units, Subcutaneous, Daily, Lorin Glass, MD, 8 Units at 04/07/23 1013   metoprolol tartrate (LOPRESSOR)  injection 2.5-5 mg, 2.5-5 mg, Intravenous, Q2H PRN, Roddenberry, Myron G, PA-C   metoprolol tartrate (LOPRESSOR) tablet 12.5 mg, 12.5 mg, Oral, BID, Eugenio Hoes, MD, 12.5 mg at 04/07/23 0844   morphine (PF) 2 MG/ML injection 1-4 mg, 1-4 mg, Intravenous, Q1H PRN, Roddenberry, Myron G, PA-C, 2 mg at 04/02/23 1934   ondansetron (ZOFRAN) injection 4 mg, 4 mg, Intravenous, Q6H PRN, Roddenberry, Myron G, PA-C, 4 mg at 04/02/23 1610   Oral care mouth rinse, 15 mL, Mouth Rinse, 4 times per day, Eugenio Hoes, MD, 15 mL at 04/07/23 0846   Oral care mouth rinse, 15 mL, Mouth Rinse, PRN, Eugenio Hoes, MD   oxyCODONE (Oxy IR/ROXICODONE) immediate release tablet 5-10 mg, 5-10 mg, Oral, Q3H PRN, Roddenberry, Myron G, PA-C, 10 mg at 04/04/23 1217   pantoprazole (PROTONIX) injection 40 mg, 40 mg, Intravenous, Daily, Silvana Newness, RPH, 40 mg at 04/07/23 0844   sodium chloride flush (NS) 0.9 % injection 10-40 mL, 10-40 mL, Intracatheter, Q12H, Eugenio Hoes, MD, 10 mL at 04/07/23 0847   sodium chloride flush (NS) 0.9 % injection 10-40 mL, 10-40 mL, Intracatheter, PRN, Eugenio Hoes, MD   sodium chloride flush (NS) 0.9 % injection 3 mL, 3 mL, Intravenous, Q12H, Roddenberry, Myron G, PA-C, 3 mL at 04/06/23 0957   sodium chloride flush (NS) 0.9 % injection 3 mL, 3 mL, Intravenous, PRN, Roddenberry, Myron G, PA-C   tamsulosin (FLOMAX) capsule 0.4 mg, 0.4 mg, Oral, BID, Roddenberry, Myron G, PA-C, 0.4 mg at 04/07/23 0845   traMADol (ULTRAM) tablet 50-100 mg, 50-100 mg, Oral, Q4H PRN, Roddenberry, Myron G, PA-C   traZODone (DESYREL) tablet 50 mg, 50 mg, Oral, QHS PRN, Eugenio Hoes, MD, 50 mg at  04/06/23 2306   Warfarin - Pharmacist Dosing Inpatient, , Does not apply, q1600, [facility], Given at 04/06/23 1716      Patients Current Diet:  Diet Order                  Diet regular Room service appropriate? Yes with Assist; Fluid consistency: Thin  Diet effective now                         Precautions / Restrictions Precautions Precautions: Sternal, Fall Precaution Booklet Issued: No Precaution Comments: pt able to recall precautions however requires constant cues to adhere to them functionally Restrictions Weight Bearing Restrictions: Yes (sternal precaution) RUE Weight Bearing: Non weight bearing LUE Weight Bearing: Non weight bearing Other Position/Activity Restrictions: chest tube    Has the patient had 2 or more falls or a fall with injury in the past year? No   Prior Activity Level Community (5-7x/wk): indep, not working full time, no DME used, driving   Prior Functional Level Self Care: Did the patient need help bathing, dressing, using the toilet or eating? Independent   Indoor Mobility: Did the patient need assistance with walking from room to room (with or without device)? Independent   Stairs: Did the patient need assistance with internal or external stairs (with or without device)? Independent   Functional Cognition: Did the patient need help planning regular tasks such as shopping or remembering to take medications? Independent   Patient Information Are you of Hispanic, Latino/a,or Spanish origin?: A. No, not of Hispanic, Latino/a, or Spanish origin What is your race?: A. White Do you need or want an interpreter to communicate with a doctor or health care staff?: 0. No   Patient's Response To:  Health Literacy and Transportation Is the patient able to respond to health literacy and transportation needs?: Yes Health Literacy - How often do you need to have someone help you when you read instructions, pamphlets, or other written material  from your doctor or pharmacy?: Never In the past 12 months, has lack of transportation kept you from medical appointments or from getting medications?: No In the past 12 months, has lack of transportation kept you from meetings, work, or from getting things needed for daily living?: No   Journalist, newspaper / Equipment Home Assistive Devices/Equipment: None   Prior Device Use: Indicate devices/aids used by the patient prior to current illness, exacerbation or injury? None of the above   Current Functional Level Cognition   Arousal/Alertness: Awake/alert Overall Cognitive Status: Impaired/Different from baseline Current Attention Level: Sustained, Selective Orientation Level: Oriented X4 Following Commands: Follows one step commands with increased time Safety/Judgement: Decreased awareness of deficits General Comments: requires increased time to process, but incremental improvement.  Pt feeling that it is all need to wake up. Attention: Sustained Sustained Attention: Appears intact Memory: Impaired Memory Impairment: Retrieval deficit (3/5 words) Awareness: Impaired Awareness Impairment: Intellectual impairment, Emergent impairment Problem Solving: Impaired Safety/Judgment: Impaired    Extremity Assessment (includes Sensation/Coordination)   Upper Extremity Assessment: Generalized weakness RUE Deficits / Details: global weakness, but WFL for basic ROM within limits of sternal prec RUE Coordination: decreased fine motor LUE Deficits / Details: global weakness compared to RUE, decr coordination during functional reach task LUE Coordination: decreased fine motor  Lower Extremity Assessment: Defer to PT evaluation     ADLs   Overall ADL's : Needs assistance/impaired Eating/Feeding: Set up, Sitting Eating/Feeding Details (indicate cue type and reason): drinking water Grooming: Minimal assistance, Sitting Grooming Details (indicate cue type and reason): cues for sequencing Upper  Body Bathing: Moderate assistance Lower Body Bathing: Maximal assistance Upper Body Dressing : Minimal assistance, Sitting Upper Body Dressing Details (indicate cue type and reason): donning gown Lower Body Dressing: Maximal assistance, Sit to/from stand Toilet Transfer: Moderate assistance, Rolling walker (2 wheels), Regular Toilet Toilet Transfer Details (indicate cue type and reason): with Programmer, applications- Clothing Manipulation and Hygiene: Sit to/from stand, Maximal assistance Functional mobility during ADLs: Moderate assistance, +2 for safety/equipment     Mobility   Overal bed mobility: Needs Assistance Bed Mobility: Sit to Supine Sidelying to sit: Min assist Supine to sit: Mod assist, +2 for physical assistance Sit to sidelying: Mod assist General bed mobility comments: pt maneuvered LE's off bed.  Assisted trunk up and forward.  LE assist to supine.  Pt able to scoot to EOB without assist.     Transfers   Overall transfer level: Needs assistance Equipment used:  (eva walker) Transfers: Sit to/from Stand Sit to Stand: Mod assist Bed to/from chair/wheelchair/BSC transfer type:: Step pivot Step pivot transfers: Mod assist General transfer comment: light mod assist for coming forward more than boost, use of heart pillow to reinforce no use of UE's     Ambulation / Gait / Stairs / Wheelchair Mobility   Ambulation/Gait Ambulation/Gait assistance: Min assist, Mod assist Gait Distance (Feet): 160 Feet Assistive device: Rolling walker (2 wheels) Gait Pattern/deviations: Step-through pattern, Decreased stride length, Narrow base of support, Drifts right/left, Scissoring General Gait Details: mild unsteadiness overall with some degradation as his involved side fatigued.  Mod assist generally with short episodes of min assist. Gait velocity: dec Gait velocity interpretation: <1.31 ft/sec, indicative of household ambulator     Posture /  Balance Dynamic Sitting Balance Sitting  balance - Comments: sits statically, tends to lean posteriorly but needs incr cues to bring chest forward, difficult to maintain Balance Overall balance assessment: Needs assistance Sitting-balance support: Feet supported Sitting balance-Leahy Scale: Fair Sitting balance - Comments: sits statically, tends to lean posteriorly but needs incr cues to bring chest forward, difficult to maintain Standing balance support: Reliant on assistive device for balance Standing balance-Leahy Scale: Poor Standing balance comment: dependent on external support     Special needs/care consideration Continuous Drip IV  ampicillin and ceftriaxone, Skin sternal incision, and Diabetic management yes    Previous Home Environment (from acute therapy documentation) Living Arrangements: Alone Available Help at Discharge: Family Type of Home: House Home Layout: One level Home Access: Ramped entrance Home Care Services: No Additional Comments: plans to go to daughter's home, reports is handicapped accessable   Discharge Living Setting Plans for Discharge Living Setting: Other (Comment) (ALF versus home with daughter) Type of Home at Discharge: House Discharge Home Layout: One level Discharge Home Access: Ramped entrance Discharge Bathroom Shower/Tub: Tub/shower unit Discharge Bathroom Toilet: Standard Discharge Bathroom Accessibility: Yes How Accessible: Accessible via walker Does the patient have any problems obtaining your medications?: No   Social/Family/Support Systems Anticipated Caregiver: daughters Hospital doctor and Bunch Anticipated Industrial/product designer Information: Hospital doctor [phone]; Chrissy [phone] Ability/Limitations of Caregiver: min assist Caregiver Availability: 24/7 Discharge Plan Discussed with Primary Caregiver: Yes Is Caregiver In Agreement with Plan?: Yes Does Caregiver/Family have Issues with Lodging/Transportation while Pt is in Rehab?: No   Goals Patient/Family Goal for Rehab:  PT/OT/SLP supervision Expected length of stay: 10-14 days Additional Information: Discharge plan: ideally will discharge directly to an ALF, however if that is not possible can d/c home with Amber until they are able to place him. Pt/Family Agrees to Admission and willing to participate: Yes Program Orientation Provided & Reviewed with Pt/Caregiver Including Roles  & Responsibilities: Yes Additional Information Needs: medicaid application pending  Barriers to Discharge: Insurance for SNF coverage, Decreased caregiver support   Decrease burden of Care through IP rehab admission: n/a   Possible need for SNF placement upon discharge:  Not anticipated. Plan for discharge directly to ALF versus home with daughter [name] if bed not available at time of discharge.     Patient Condition: I have reviewed medical records from [facility], spoken with  [facility] , and patient and daughter. I met with patient at the bedside and discussed via phone for inpatient rehabilitation assessment.  Patient will benefit from ongoing PT, OT, and SLP, can actively participate in 3 hours of therapy a day 5 days of the week, and can make measurable gains during the admission.  Patient will also benefit from the coordinated team approach during an Inpatient Acute Rehabilitation admission.  The patient will receive intensive therapy as well as Rehabilitation physician, nursing, social worker, and care management interventions.  Due to bladder management, bowel management, safety, skin/wound care, disease management, medication administration, pain management, and patient education the patient requires 24 hour a day rehabilitation nursing.  The patient is currently min assist to mod assist with mobility and basic ADLs.  Discharge setting and therapy post discharge at  home  is anticipated.  Patient has agreed to participate in the Acute Inpatient Rehabilitation Program and will admit today.   Preadmission Screen Completed By:  Stephania Fragmin, PT, DPT  04/07/2023 11:06 AM ______________________________________________________________________   Discussed status with Dr. Wynn Banker on 04/09/23  at 900 AM  and received approval  for admission today.   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 Yes Co-Morbidities requiring supervision/potential complications: Set take shock secondary to bacteremia, acute hypoxic respiratory failure, acute renal failure secondary to septic shock, metabolic encephalopathy, multifocal CVA, chronic anemia, A-fib, hyperlipidemia, hyperglycemia, history of AAA, left hip prosthesis, hypertension, CAD Due to bladder management, bowel management, safety, skin/wound care, disease management, medication administration, pain management, and patient education, does the patient require 24 hr/day rehab nursing? Yes Does the patient require coordinated care of a physician, rehab nurse, PT, OT, and SLP to address physical and functional deficits in the context of the above medical diagnosis(es)? Yes Addressing deficits in the following areas: balance, endurance, locomotion, strength, transferring, bowel/bladder control, bathing, dressing, feeding, grooming, toileting, cognition, speech, language, and psychosocial support Can the patient actively participate in an intensive therapy program of at least 3 hrs of therapy 5 days a week? Yes The potential for patient to make measurable gains while on inpatient rehab is excellent Anticipated functional outcomes upon discharge from inpatient rehab: supervision PT, supervision OT, supervision SLP Estimated rehab length of  stay to reach the above functional goals is: 10-14 Anticipated discharge destination: Home 10. Overall Rehab/Functional Prognosis: good     MD Signature: Erick Colace M.D. [facility] Fellow Am Acad of Phys Med and Rehab Diplomate Am Board of Electrodiagnostic Med Fellow Am Board of Interventional Pain

## 2023-04-10 NOTE — Progress Notes (Signed)
Inpatient Rehabilitation Center Individual Statement of Services  Patient Name:  Dale Mills  Date:  04/10/2023  Welcome to the Inpatient Rehabilitation Center.  Our goal is to provide you with an individualized program based on your diagnosis and situation, designed to meet your specific needs.  With this comprehensive rehabilitation program, you will be expected to participate in at least 3 hours of rehabilitation therapies Monday-Friday, with modified therapy programming on the weekends.  Your rehabilitation program will include the following services:  Physical Therapy (PT), Occupational Therapy (OT), Speech Therapy (ST), 24 hour per day rehabilitation nursing, Therapeutic Recreaction (TR), Neuropsychology, Care Coordinator, Rehabilitation Medicine, Nutrition Services, Pharmacy Services, and Other  Weekly team conferences will be held on Wednesdays to discuss your progress.  Your Inpatient Rehabilitation Care Coordinator will talk with you frequently to get your input and to update you on team discussions.  Team conferences with you and your family in attendance may also be held.  Expected length of stay: 10-14 Days  Overall anticipated outcome: Sup/Min A  Depending on your progress and recovery, your program may change. Your Inpatient Rehabilitation Care Coordinator will coordinate services and will keep you informed of any changes. Your Inpatient Rehabilitation Care Coordinator's name and contact numbers are listed  below.  The following services may also be recommended but are not provided by the Inpatient Rehabilitation Center:   Home Health Rehabiltiation Services Outpatient Rehabilitation Services    Arrangements will be made to provide these services after discharge if needed.  Arrangements include referral to agencies that provide these services.  Your insurance has been verified to be:  uninsured Your primary doctor is:  Cheri Rous, MD  Pertinent information will be  shared with your doctor and your insurance company.  Inpatient Rehabilitation Care Coordinator:  Lavera Guise, [place] [phone] or [phone]  Information discussed with and copy given to patient by: Andria Rhein, 04/10/2023, 10:08 AM

## 2023-04-10 NOTE — Progress Notes (Addendum)
ANTICOAGULATION CONSULT NOTE- follow-up  Pharmacy Consult for warfarin Indication: atrial fibrillation  No Known Allergies  Patient Measurements: Height: 6' (182.9 cm) Weight: 115 kg (253 lb 8.5 oz) IBW/kg (Calculated) : 77.6 Heparin Dosing Weight: 100 kg  Vital Signs: Temp: 97.8 F (36.6 C) (09/30 0409) BP: 120/92 (09/30 0826) Pulse Rate: 107 (09/30 0826)  Labs: Recent Labs    04/08/23 0454 04/09/23 0440 04/10/23 1100  HGB 7.6* 7.9*  --   HCT 24.9* 25.7*  --   PLT 255 304  --   LABPROT 20.2* 23.5* 24.7*  INR 1.7* 2.1* 2.2*  HEPARINUNFRC 0.14*  --   --   CREATININE 0.76 0.72  --     Estimated Creatinine Clearance: 133.4 mL/min (by C-G formula based on SCr of 0.72 mg/dL).   Medical History: Past Medical History:  Diagnosis Date   Angina pectoris (HCC) [place] classification 2 04/10/2020   Anxiety    Arthritis    Coronary artery disease coronary CT angio did not show any major issues, suspicion for distal disease 06/12/2020   Depression    DJD (degenerative joint disease)    Dyslipidemia 02/13/2020   Essential hypertension 02/13/2020   Precordial chest pain 02/13/2020    Medications:  Scheduled:   (feeding supplement) PROSource Plus  30 mL Oral BID BM   amiodarone  400 mg Oral BID   ascorbic acid  500 mg Oral Daily   aspirin EC  81 mg Oral Daily   atorvastatin  80 mg Oral Daily   Chlorhexidine Gluconate Cloth  6 each Topical Q12H   [START ON 04/12/2023] COVID-19 mRNA vaccine ([employer])  0.3 mL Intramuscular Once   enoxaparin (LOVENOX) injection  120 mg Subcutaneous Q12H   ezetimibe  10 mg Oral Daily   Fe Fum-Vit C-Vit B12-FA  1 capsule Oral QPC breakfast   furosemide  40 mg Oral Daily   insulin aspart  2-6 Units Subcutaneous TID AC & HS   insulin detemir  8 Units Subcutaneous Daily   metoprolol tartrate  12.5 mg Oral BID   nutrition supplement (JUVEN)  1 packet Oral BID BM   mouth rinse  15 mL Mouth Rinse 4 times per day   pantoprazole  40 mg Oral  Daily   Ensure Max Protein  11 oz Oral BID   sodium chloride flush  10-40 mL Intracatheter Q12H   tamsulosin  0.4 mg Oral BID   Warfarin - Pharmacist Dosing Inpatient   Does not apply q1600   Infusions:   ampicillin (OMNIPEN) IV 2 g (04/10/23 1131)   cefTRIAXone (ROCEPHIN)  IV 2 g (04/10/23 1610)    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 Having recurrent Afib postop, pharmacy now consulted to start warfarin for Afib. No new DDIs identified, patient remains on amiodarone and ampicillin which may increase serum warfarin.  Acute DVT noted in the left internal jugular and a superficial thrombosis of left upper arm.  Pt has been refusing SCD's.  Given subtherapeutic INR , heparin drip started on 9/26. 9/27 changed heparin infusion to enoxaparin per pharmacy consult in conversation with CT surgery PA.    04/10/2023 INR remains therapeutic at 2.2 on D# 7 of warfarin and he has transferred to Rehab. Patient is now eating less of his meals. Patient has received ~5 days of parenteral anticoagulation- although heparin was started ~2100 on 9/26  and did not achieve a therapeutic level prior to switching to lovenox on 9/27. Would continue with lovenox at least until 10/1 AM assuming patients INR remains >2.   -last dose of lovenox given 9/29 06:23 -last warfarin given was 9/29 18:58  Goal of Therapy:  INR 2-3   Plan: -Warfarin 4 mg PO x 1  -continue lovenox 120 mg [place] q12h -Stop enoxaparin bridge 10/1 if INR remains above 2  (patient has had an INR > 2 on 9/29 and 9/30. October 1 represents 5 days of lovenox treatment) -Daily INR, CBC every 3 days  Greta Doom BS, PharmD, BCPS Clinical Pharmacist 04/10/2023 3:26 PM  Contact: [phone] after 3 PM  "Be curious, not judgmental..." -Debbora Dus

## 2023-04-10 NOTE — Progress Notes (Addendum)
PROGRESS NOTE   Subjective/Complaints: No acute events noted overnight.  He was noted to have poor sleep at night.  Reports frequent bowel movements.  Reports occasional pain with movement of his left shoulder, this has improved is not currently painful.  Review of Systems  Constitutional:  Negative for chills, fever and malaise/fatigue.  Eyes:  Positive for blurred vision.  Respiratory:  Positive for shortness of breath.   Cardiovascular:  Positive for chest pain.  Gastrointestinal:  Positive for diarrhea.  Musculoskeletal:  Positive for joint pain.  Skin:  Negative for rash.  Neurological:  Positive for weakness.  Psychiatric/Behavioral:  The patient has insomnia.     Objective:   No results found. Recent Labs    04/08/23 0454 04/09/23 0440  WBC 10.9* 9.0  HGB 7.6* 7.9*  HCT 24.9* 25.7*  PLT 255 304   Recent Labs    04/09/23 0440 04/10/23 1100  NA 135 132*  K 3.8 3.5  CL 102 102  CO2 24 22  GLUCOSE 112* 129*  BUN 9 8  CREATININE 0.72 0.88  CALCIUM 8.4* 8.6*    Intake/Output Summary (Last 24 hours) at 04/10/2023 1416 Last data filed at 04/10/2023 1316 Gross per 24 hour  Intake 1581.63 ml  Output 3000 ml  Net -1418.37 ml        Physical Exam: Vital Signs Blood pressure 100/74, pulse 97, temperature 98.8 F (37.1 C), temperature source Oral, resp. rate 18, height 6' (1.829 m), weight 115 kg, SpO2 98%.   General: Alert and oriented x 3 HEENT: Head is normocephalic, atraumatic, mucous membranes dry Heart: Reg rate and rhythm. No murmurs rubs or gallops Chest: CTA bilaterally, nonlabored, chest wall tenderness present Abdomen: Soft, non-tender, non-distended, bowel sounds positive.  Ecchymosis lower abdomen Extremities: 2+ lower extremity edema bilaterally Psych: Pt's affect is appropriate. Pt is cooperative Skin: Clean and intact without signs of breakdown Neuro: Alert and awake, follows commands,  dysarthric speech Musculoskeletal: No significant left shoulder swelling noted, no left shoulder tenderness to palpation, PROM   Assessment/Plan: 1. Functional deficits which require 3+ hours per day of interdisciplinary therapy in a comprehensive inpatient rehab setting. Physiatrist is providing close team supervision and 24 hour management of active medical problems listed below. Physiatrist and rehab team continue to assess barriers to discharge/monitor patient progress toward functional and medical goals  Care Tool:  Bathing    Body parts bathed by patient: Left arm, Chest, Right arm, Abdomen, Front perineal area, Buttocks, Face   Body parts bathed by helper: Right lower leg, Left lower leg, Left upper leg, Right upper leg     Bathing assist Assist Level: Moderate Assistance - Patient 50 - 74%     Upper Body Dressing/Undressing Upper body dressing   What is the patient wearing?: Hospital gown only    Upper body assist Assist Level: Moderate Assistance - Patient 50 - 74%    Lower Body Dressing/Undressing Lower body dressing      What is the patient wearing?: Pants, Underwear/pull up     Lower body assist Assist for lower body dressing: Maximal Assistance - Patient 25 - 49%     Toileting Toileting    Toileting  assist Assist for toileting: Maximal Assistance - Patient 25 - 49%     Transfers Chair/bed transfer  Transfers assist     Chair/bed transfer assist level: Minimal Assistance - Patient > 75%     Locomotion Ambulation   Ambulation assist              Walk 10 feet activity   Assist           Walk 50 feet activity   Assist           Walk 150 feet activity   Assist           Walk 10 feet on uneven surface  activity   Assist           Wheelchair     Assist               Wheelchair 50 feet with 2 turns activity    Assist            Wheelchair 150 feet activity     Assist           Blood pressure 100/74, pulse 97, temperature 98.8 F (37.1 C), temperature source Oral, resp. rate 18, height 6' (1.829 m), weight 115 kg, SpO2 98%.   Medical Problem List and Plan: 1. Functional deficits secondary to debilty after MVR/AVR 03/31/2023 due to endocarditis with right PCA territory occipital infarct, smaller acute infarcts in bilateral parietal lobes and the cerebellum and small subarachnoid hemorrhage             -patient may  shower             -ELOS/Goals: 10-14d, PT/OT/SLP Sup  -Continue CIR 2.  L-internal jugular DVT/ A fib/Antithrombotics: -DVT/anticoagulation:  Pharmaceutical:LUE cephalic and internal jugular DVT Coumadin and Heparin bridge to change to lovenox at discharge             -antiplatelet therapy: ASA 3. Pain Management:  oxycodone or tramadol prn for pain.  4. Mood/Behavior/Sleep: LCSW to follow for evaluation and support.              -antipsychotic agents: N/A             -- Order melatonin prn for insomnia.  Schedule trazodone nightly 5. Neuropsych/cognition: This patient is capable of making decisions on his own behalf. 6. Skin/Wound Care: Routine pressure relief measures.  7. Fluids/Electrolytes/Nutrition: Strict I/O. Check CMET in am.  8. 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 Fluid overload/SOB: Lasix 40 mg decreased to daily on 09/25 --developed cough last nigh with SOB 09/27 pm.  --daily weights with monitoring for signs of overload. .  -wt stable, monitor    Filed Weights   04/09/23 1558 04/10/23 0409  Weight: 115.8 kg 115 kg    10. A fib: Continue on amiodarone 400 mg bid since 09/25-->taper? May need to contact cards for this  11. Pre diabetes/Stress induced hyperglycemia: Monitor BS ac/hs.  --Hgb A1C 6.2 (prediabetic). --diet changed to CM from regular. Continue Levemir 8 units daily with SSI prn for  tighter BS control. .  --Transition insulin to metformin as intake improves.   -9/30 decrease levemir to 6 u CBG (last 3)  Recent Labs    04/09/23 2058 04/10/23 0536 04/10/23 1133  GLUCAP 130* 113* 104*  12 ABLA:   Monitor for signs of bleeding             --transfuse prn <7.0 or if symptomatic.   -9/30 hemoglobin stable at 7.9 yesterday CBC    Component Value Date/Time   WBC 9.0 04/09/2023 0440   RBC 2.78 (L) 04/09/2023 0440   HGB 7.9 (L) 04/09/2023 0440   HCT 25.7 (L) 04/09/2023 0440   PLT 304 04/09/2023 0440   MCV 92.4 04/09/2023 0440   MCH 28.4 04/09/2023 0440   MCHC 30.7 04/09/2023 0440   RDW 18.8 (H) 04/09/2023 0440      13. Constipation: Resolved and now w/diarrhea. Will hold laxative.    -9/30 frequent BM not liquid, continue to hold laxatives  14. Hyponatremia,mild  -Recheck thursday LOS: 1 days A FACE TO FACE EVALUATION WAS PERFORMED  Fanny Dance 04/10/2023, 2:16 PM

## 2023-04-10 NOTE — Progress Notes (Signed)
Inpatient Rehabilitation  Patient information reviewed and entered into [facility] system by Demarrio Menges Jordy Verba, OTR/L, Rehab Quality Coordinator.   Information including medical coding, functional ability and quality indicators will be reviewed and updated through discharge.   

## 2023-04-10 NOTE — Plan of Care (Signed)
  Problem: RH Problem Solving Goal: LTG Patient will demonstrate problem solving for (SLP) Description: LTG:  Patient will demonstrate problem solving for basic/complex daily situations with cues  (SLP) Flowsheets (Taken 04/10/2023 1653) LTG: Patient will demonstrate problem solving for (SLP): Complex daily situations LTG Patient will demonstrate problem solving for: Supervision   Problem: RH Memory Goal: LTG Patient will demonstrate ability for day to day (SLP) Description: LTG:   Patient will demonstrate ability for day to day recall/carryover during cognitive/linguistic activities with assist  (SLP) Flowsheets (Taken 04/10/2023 1653) LTG: Patient will demonstrate ability for day to day recall: Daily complex information LTG: Patient will demonstrate ability for day to day recall/carryover during cognitive/linguistic activities with assist (SLP): Supervision   Problem: RH Attention Goal: LTG Patient will demonstrate this level of attention during functional activites (SLP) Description: LTG:  Patient will will demonstrate this level of attention during functional activites (SLP) Flowsheets (Taken 04/10/2023 1653) Patient will demonstrate during cognitive/linguistic activities the attention type of: Selective Patient will demonstrate this level of attention during cognitive/linguistic activities in: Community LTG: Patient will demonstrate this level of attention during cognitive/linguistic activities with assistance of (SLP): Supervision   Problem: RH Expression Communication Goal: LTG Patient will increase speech intelligibility (SLP) Description: LTG: Patient will increase speech intelligibility at word/phrase/conversation level with cues, % of the time (SLP) Flowsheets (Taken 04/10/2023 1654) LTG: Patient will increase speech intelligibility (SLP): Supervision Level: Conversation level

## 2023-04-11 LAB — CBC
HCT: 24.7 % — ABNORMAL LOW (ref 39.0–52.0)
Hemoglobin: 7.5 g/dL — ABNORMAL LOW (ref 13.0–17.0)
MCH: 27 pg (ref 26.0–34.0)
MCHC: 30.4 g/dL (ref 30.0–36.0)
MCV: 88.8 fL (ref 80.0–100.0)
Platelets: 336 10*3/uL (ref 150–400)
RBC: 2.78 MIL/uL — ABNORMAL LOW (ref 4.22–5.81)
RDW: 17.5 % — ABNORMAL HIGH (ref 11.5–15.5)
WBC: 7.2 10*3/uL (ref 4.0–10.5)
nRBC: 0 % (ref 0.0–0.2)

## 2023-04-11 LAB — PROTIME-INR
INR: 2.2 — ABNORMAL HIGH (ref 0.8–1.2)
Prothrombin Time: 25 s — ABNORMAL HIGH (ref 11.4–15.2)

## 2023-04-11 LAB — GLUCOSE, CAPILLARY
Glucose-Capillary: 101 mg/dL — ABNORMAL HIGH (ref 70–99)
Glucose-Capillary: 100 mg/dL — ABNORMAL HIGH (ref 70–99)
Glucose-Capillary: 105 mg/dL — ABNORMAL HIGH (ref 70–99)
Glucose-Capillary: 119 mg/dL — ABNORMAL HIGH (ref 70–99)

## 2023-04-11 MED ORDER — WARFARIN SODIUM 4 MG PO TABS
4.0000 mg | ORAL_TABLET | Freq: Every day | ORAL | Status: DC
  Administered 2023-04-11 – 2023-04-13 (×3): 4 mg via ORAL
  Filled 2023-04-11 (×3): qty 1

## 2023-04-11 NOTE — Progress Notes (Signed)
Physical Therapy Assessment and Plan  Patient Details  Name: Dale Mills MRN: [id number] Date of Birth: April 29, 1966  PT Diagnosis: Abnormal posture, Abnormality of gait, Cognitive deficits, Difficulty walking, Edema, Impaired cognition, and Muscle weakness Rehab Potential: Good ELOS: 10-14   Today's Date: 04/11/2023 PT Individual Time: 0805-0902 PT Individual Time Calculation (min): 57 min    Hospital Problem: Principal Problem:   Debility Active Problems:   Endocarditis of mitral valve   Acute ischemic left posterior cerebral artery (PCA) stroke (HCC)   Past Medical History:  Past Medical History:  Diagnosis Date   Angina pectoris (HCC) [place] classification 2 04/10/2020   Anxiety    Arthritis    Coronary artery disease coronary CT angio did not show any major issues, suspicion for distal disease 06/12/2020   Depression    DJD (degenerative joint disease)    Dyslipidemia 02/13/2020   Essential hypertension 02/13/2020   Precordial chest pain 02/13/2020   Past Surgical History:  Past Surgical History:  Procedure Laterality Date   ASCENDING AORTIC ROOT REPLACEMENT N/A 03/31/2023   Procedure: ASCENDING AORTIC ROOT REPLACEMENT UTILIZING 27 KONECT RESILIA  AORTIC VALVE CONDUIT;  Surgeon: Eugenio Hoes, MD;  Location: [facility] OR;  Service: Open Heart Surgery;  Laterality: N/A;   COLONOSCOPY W/ POLYPECTOMY     FOREARM SURGERY     MASS REMOVED   HIP ARTHROSCOPY Left    LASIK Bilateral    MITRAL VALVE REPLACEMENT N/A 03/31/2023   Procedure: MITRAL VALVE (MV) REPLACEMENT UTILIZING SIZE 33 MOSAIC PORCINE HEART VALVE;  Surgeon: Eugenio Hoes, MD;  Location: [facility] OR;  Service: Open Heart Surgery;  Laterality: N/A;   TEE WITHOUT CARDIOVERSION N/A 03/31/2023   Procedure: TRANSESOPHAGEAL ECHOCARDIOGRAM;  Surgeon: Eugenio Hoes, MD;  Location: [facility] OR;  Service: Open Heart Surgery;  Laterality: N/A;   TONSILECTOMY, ADENOIDECTOMY, BILATERAL MYRINGOTOMY AND TUBES  2011   UMBILICAL HERNIA REPAIR      VASECTOMY      Assessment & Plan Clinical Impression: Patient is a 57 y.o. LH-male with history of HTN, non-obst CAD, THR 4/24, recurrent UTI with pyelonephritis in the past who was admitted via [facility] on 03/28/23 with N/V and malaise due to E fecalis  bacteremia, 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 He was transferred to [facility] for management. He was started on IV antibiotics and pressors. TEE by Dr. 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 Family reported that he had not been himself  since hip surgery, was anemic and was having issues with N/V few times a day. TCTS consulted for input on surgical intervention and patient monitored till medically stable.  He has bouts of agitation w/lethargy and MRI/MRA brain done revealing confluent R-PCA occipital infarct with acute smaller infarcts in bilateral parietal lobes and in cerebellum, small volume SAH right frontal convexity question due to septic emboli.Dr. Renold Don consulted for input and recommended hip imaging to rule out joint infection due to ongoing pain and swelling. Due to concerns of further decompensation, he was taken to OR for MVR, AVR and ascending aortic root replacement by Dr. Leafy Ro on 03/31/23. He tolerated extubation without difficulty and post op A fib treated with Amiodarone. Fluid overload managed with diuresis. He was cleared to start regular diet and PICC placed on 09/25. MRI hip 09/24 was negative for septic arthritis and edema left gluteus and proximal quad  musculature felt to be infectious v/s inflammatory with SQ edema laterally in pelvis and proximal thighs bilaterally. Dr. Drue Second recommends 6 weeks IV ampicillin plus ceftriaxone with EOT 11/1 and follow up on outpatient basis for likely extension with chronic oral antibiotics.   Coumadin added on 09/25 due to persistent A fib. Lethargy has resolved. LUE dopplered today due to edema and was positive for L-internal jugular DVT as well as superficial left cephalic DVT.Patient transferred to CIR on 04/09/2023 .   Patient currently requires mod with mobility secondary to muscle weakness, decreased cardiorespiratoy endurance,  , decreased visual acuity and decreased visual perceptual skills, decreased attention to left, decreased attention, decreased awareness, decreased problem solving, decreased safety awareness, and decreased memory,  , and decreased standing balance, decreased postural control, decreased balance strategies, and difficulty maintaining precautions.  Prior to hospitalization, patient was independent  with mobility and lived with Alone in a House home.  Home access is  Ramped entrance (railing on R, house on L).  Patient will benefit from skilled PT intervention to maximize safe functional mobility, minimize fall risk, and decrease caregiver burden for planned discharge home with 24 hour supervision.  Anticipate patient will benefit from follow up HH at discharge.  PT - End of Session Activity Tolerance: Tolerates 30+ min activity with multiple rests Endurance Deficit: Yes Endurance Deficit Description: heavy breathing, 100bpm HR at rest PT Assessment Rehab Potential (ACUTE/IP ONLY): Good PT Barriers to Discharge: Decreased caregiver support;Lack of/limited family support;Weight PT Patient demonstrates impairments in the following area(s): Behavior;Motor;Safety;Balance;Edema;Pain;Endurance;Perception PT Transfers Functional Problem(s): Bed to Chair;Car;Bed Mobility PT Locomotion Functional Problem(s): Ambulation;Stairs PT Plan PT Intensity: Minimum of 1-2 x/day ,45 to 90 minutes PT Frequency: 5 out of 7 days PT Duration Estimated Length of Stay: 10-14 PT Treatment/Interventions: Ambulation/gait training;Balance/vestibular training;Cognitive  remediation/compensation;Community reintegration;Discharge planning;Disease management/prevention;DME/adaptive equipment instruction;Functional mobility training;Neuromuscular re-education;Pain management;Patient/family education;Stair training;Therapeutic Activities;Therapeutic Exercise;UE/LE Strength taining/ROM;UE/LE Coordination activities;Visual/perceptual remediation/compensation;Skin care/wound management PT Transfers Anticipated Outcome(s): supervision PT Locomotion Anticipated Outcome(s): CGA PT Recommendation Recommendations for Other Services: Therapeutic Recreation consult Therapeutic Recreation Interventions: Stress management Follow Up Recommendations: Home health PT;24 hour supervision/assistance Patient destination: Home (possible ALF) Equipment Recommended: To be determined   PT Evaluation Precautions/Restrictions Precautions Precautions: Sternal;Fall Precaution Comments: Verbal education provided during eval. Pt able to verbalize he "doesn't need to open up the incision w/ movement" Restrictions Other Position/Activity Restrictions: sternal precaution Pain Pain Assessment Pain Scale: 0-10 Pain Interference Pain Interference Pain Effect on Sleep: 0. Does not apply - I have not had any pain or hurting in the past 5 days Pain Interference with Therapy Activities: 0. Does not apply - I have not received rehabilitationtherapy in the past 5 days Pain Interference with Day-to-Day Activities: 1. Rarely or not at all Home Living/Prior Functioning Home Living Available Help at Discharge: Family (daughter who is in a WC lives 15 minutes away) Type of Home: House Home Access: Ramped entrance (railing on R, house on L) Home Layout: One level Bathroom Shower/Tub: Armed forces operational officer Accessibility: Yes Additional Comments: plans to go to daughter's home if not room available at ALF, reports it is handicapped accessable  Lives With: Alone Prior  Function Level of Independence: Independent with basic ADLs;Independent with gait  Able to Take Stairs?: Yes Driving: Yes Vocation: Full time employment Vocation Requirements: worked at [employer] - History Ability to See in Adequate Light: 1 Impaired Vision - Assessment Eye Alignment: Impaired (comment) Ocular Range of Motion: Restricted on the left Alignment/Gaze Preference:  Head tilt;Gaze right (mild) Tracking/Visual Pursuits: Able to track stimulus in all quads without difficulty Perception Perception: Within Functional Limits Preception Impairment Details: Inattention/Neglect;Spatial orientation (mild L side) Praxis Praxis: WFL  Cognition Overall Cognitive Status: Impaired/Different from baseline Arousal/Alertness: Awake/alert Orientation Level: Oriented X4 Year: 2024 Month: September Day of Week: Correct Safety/Judgment: Impaired Sensation Sensation Light Touch: Appears Intact Hot/Cold: Not tested Proprioception: Appears Intact Stereognosis: Not tested Coordination Gross Motor Movements are Fluid and Coordinated: Yes Fine Motor Movements are Fluid and Coordinated: Not tested Motor  Motor Motor: Abnormal postural alignment and control Motor - Skilled Clinical Observations: generalized deconditioning d/t cardiovascular defecits  Trunk/Postural Assessment  Cervical Assessment Cervical Assessment: Exceptions to [facility] (forward head) Thoracic Assessment Thoracic Assessment: Exceptions to [facility] (kyphotic) Lumbar Assessment Lumbar Assessment: Exceptions to [facility] (posterior pelvic tilt) Postural Control Postural Control: Deficits on evaluation Trunk Control: increased sway d/t weakness Righting Reactions: deficits d/t weakness & fatigue  Balance Balance Balance Assessed: Yes Static Sitting Balance Static Sitting - Balance Support: No upper extremity supported;Feet supported Static Sitting - Level of Assistance: 5: Stand by  assistance Dynamic Sitting Balance Dynamic Sitting - Balance Support: During functional activity;Feet supported Dynamic Sitting - Level of Assistance: 5: Stand by assistance Dynamic Sitting - Balance Activities: Lateral lean/weight shifting;Forward lean/weight shifting Sitting balance - Comments: sits on EOB statically, mild posterior lean Static Standing Balance Static Standing - Balance Support: During functional activity;Bilateral upper extremity supported Static Standing - Level of Assistance: 4: Min assist Dynamic Standing Balance Dynamic Standing - Balance Support: Bilateral upper extremity supported;During functional activity Dynamic Standing - Level of Assistance: 3: Mod assist Dynamic Standing - Comments: during ambulation Extremity Assessment  RLE Assessment RLE Assessment: Exceptions to [facility] Passive Range of Motion (PROM) Comments: WFL Active Range of Motion (AROM) Comments: WFL General Strength Comments: grossly 4/5 LLE Assessment LLE Assessment: Exceptions to [facility] Passive Range of Motion (PROM) Comments: limited hip flexion Active Range of Motion (AROM) Comments: limited hip flexion LLE Strength Left Hip Flexion: 3-/5 Left Knee Flexion: 4-/5 Left Knee Extension: 4-/5 Left Ankle Dorsiflexion: 4-/5 Left Ankle Plantar Flexion: 4-/5  Care Tool Care Tool Bed Mobility Roll left and right activity   Roll left and right assist level: Minimal Assistance - Patient > 75%    Sit to lying activity   Sit to lying assist level: Minimal Assistance - Patient > 75%    Lying to sitting on side of bed activity   Lying to sitting on side of bed assist level: the ability to move from lying on the back to sitting on the side of the bed with no back support.: Moderate Assistance - Patient 50 - 74%     Care Tool Transfers Sit to stand transfer   Sit to stand assist level: Minimal Assistance - Patient > 75%    Chair/bed transfer   Chair/bed transfer assist level: Moderate Assistance -  Patient 50 - 74%     Scientist, research (physical sciences) transfer activity did not occur: Safety/medical concerns        Care Tool Locomotion Ambulation   Assist level: Moderate Assistance - Patient 50 - 74% Assistive device: Walker-rolling Max distance: 21 ft  Walk 10 feet activity   Assist level: Moderate Assistance - Patient - 50 - 74% Assistive device: Walker-rolling   Walk 50 feet with 2 turns activity Walk 50 feet with 2 turns activity did not occur: Safety/medical concerns      Walk 150 feet  activity Walk 150 feet activity did not occur: Safety/medical concerns      Walk 10 feet on uneven surfaces activity Walk 10 feet on uneven surfaces activity did not occur: Safety/medical concerns      Stairs Stair activity did not occur: Safety/medical concerns        Walk up/down 1 step activity Walk up/down 1 step or curb (drop down) activity did not occur: Safety/medical concerns      Walk up/down 4 steps activity Walk up/down 4 steps activity did not occur: Safety/medical concerns      Walk up/down 12 steps activity Walk up/down 12 steps activity did not occur: Safety/medical concerns      Pick up small objects from floor   Pick up small object from the floor assist level: Total Assistance - Patient < 25%    Wheelchair Is the patient using a wheelchair?: Yes Type of Wheelchair: Manual   Wheelchair assist level: Dependent - Patient 0%    Wheel 50 feet with 2 turns activity   Assist Level: Dependent - Patient 0%  Wheel 150 feet activity   Assist Level: Dependent - Patient 0%    Refer to Care Plan for Long Term Goals  SHORT TERM GOAL WEEK 1 PT Short Term Goal 1 (Week 1): Pt will STS CGA w/ LRAD PT Short Term Goal 2 (Week 1): Pt will ambulate 50 ft modA w/ LRAD PT Short Term Goal 3 (Week 1): Pt will navigate ramp modA w/ LRAD PT Short Term Goal 4 (Week 1): Pt w/ perform bed to chair transfer w/ minA using LRAD PT Short Term Goal 5 (Week 1): Pt will require  minA for bed mobility  Recommendations for other services: Therapeutic Recreation  Stress management  Skilled Therapeutic Intervention Evaluation completed (see details above) with patient education regarding purpose of PT evaluation, PT POC and goals, therapy schedule, weekly team meetings, and other CIR information including safety plan and fall risk safety. Pt performed the below functional mobility tasks with the specified levels of skilled cuing and assistance.   Pt demo increased fatigue and dyspnea throughout session. He requires increased verbal cues to remember sternal precautions. He is modA from supine to sitting on EOB, minA for STS, modA for bed to chair transfers using RW and ambulating 16ft using RW w/ +2 WC in tow.  HR 100 resting, up to 110 for ambulation  Pt left in WC, alarm activated, all needs met.  Mobility Bed Mobility Bed Mobility: Supine to Sit;Sit to Supine;Rolling Right Rolling Right: Minimal Assistance - Patient > 75% Right Sidelying to Sit: Moderate Assistance - Patient 50-74% Supine to Sit: Moderate Assistance - Patient 50-74% Transfers Transfers: Sit to Stand;Stand to Sit;Stand Pivot Transfers Sit to Stand: Minimal Assistance - Patient > 75% Stand to Sit: Minimal Assistance - Patient > 75% Stand Pivot Transfers: Moderate Assistance - Patient 50 - 74% Stand Pivot Transfer Details: Verbal cues for sequencing;Verbal cues for technique;Verbal cues for precautions/safety;Verbal cues for safe use of DME/AE Transfer (Assistive device): Rolling walker Locomotion  Gait Ambulation: Yes Gait Assistance: Moderate Assistance - Patient 50-74%;2 Helpers;Other (comment) (WC in tow) Gait Distance (Feet): 21 Feet Assistive device: Rolling walker Gait Assistance Details: Verbal cues for sequencing;Verbal cues for technique;Verbal cues for precautions/safety;Verbal cues for gait pattern;Verbal cues for safe use of DME/AE Gait Gait: Yes Gait Pattern: Impaired Gait  Pattern: Step-through pattern;Decreased step length - right;Decreased step length - left;Trunk flexed Gait velocity: decreased Stairs / Additional Locomotion Stairs: No Wheelchair Mobility Wheelchair Mobility:  Yes Wheelchair Assistance: Dependent - Patient 0%   Discharge Criteria: Patient will be discharged from PT if patient refuses treatment 3 consecutive times without medical reason, if treatment goals not met, if there is a change in medical status, if patient makes no progress towards goals or if patient is discharged from hospital.  The above assessment, treatment plan, treatment alternatives and goals were discussed and mutually agreed upon: by patient  [name] 04/11/2023, 11:51 AM

## 2023-04-11 NOTE — Progress Notes (Signed)
Occupational Therapy Session Note  Patient Details  Name: Dale Mills MRN: [id number] Date of Birth: 02-May-1966  Today's Date: 04/11/2023 OT Individual Time: 2952-8413 OT Individual Time Calculation (min): 60 min  OT Individual Time: 1415-1539 OT Individual Time Calculation (min): 45 min   Short Term Goals: Week 1:  OT Short Term Goal 1 (Week 1): Pt will be able to recall sternal precautions during self care tasks 3/5 sessions with minimal cueing. OT Short Term Goal 2 (Week 1): Pt will complete LB dressing with Mod A utilizing AE as needed. OT Short Term Goal 3 (Week 1): Pt will completed LB bathing with Min A utilizing AE as needed.  Skilled Therapeutic Interventions/Progress Updates:     AM Session:  Pt received siting up in wc presenting to be in good spirits receptive to skilled OT session reporting 0/10 pain- OT offering intermittent rest breaks, repositioning, and therapeutic support to optimize participation in therapy session. Pt reporting fatigue following AM PT session with pt stating "I have already done a lot today". Provided therapeutic support and encouragement with noted improvement in moral and participation. Provided education on energy conservation techniques with emphasis on activity pacing, providing self increased time, planning out day, and prioritizing tasks with Pt receptive to education. Pt then able to report 2 ways to conserve his energy at home by sitting down to complete activities and giving himself increased time/rest breaks. Pt requesting to order lunch during session. Provided education on hospital phone use and process for ordering food. Engaged pt in functional cognitive skills activity to work on processing, attention, and following directions following education with Pt instructed to utilize room phone and hospital menu. Pt able to complete activity with overall mod A to dial phone and increased time for problem solving. Transported Pt total A to therapy gym  in wc for time management and energy conservation. Engaged pt in simulated medication management activity to work on functional cognition, problem solving, following written directions, and attention. Provided education on pill box for organizing medications to ensure medications are taken at correct times of day and in correct dosage- Pt receptive to education reporting he uses pill boxes at home to support medication adherence. Provided pt with 5 "medications" with written directions and instructed Pt to fill pill box for entire week following directions written on the bottles. Pt required directions to be repeated multiple times and max verbal/gestural cues to complete activity correctly. Pt able to correctly verbalize how to organize medications after reading directions, however when initiating task Pt requiring max A to recall directions and functionally problem solve how to follow each direction. Pt more successful with single step directions vs two step. Pt demonstrating challenges attending to task in quiet environment, recalling directions, problem solving, and locating mistakes. Educated on side effects of not adhering to medications (ie taking too much, too little, or at incorrect time of day) with pt receptive to education and able to verbalize potential adverse side effects of not adhering to medications with mod questioning cues. Transported Pt back to room total A in wc for energy conservation. Pt was handed off to SLP entering room with all immediate needs met.    PM Session:  Pt received sitting up in his wc presenting with flat affect receptive to skilled OT session reporting 0/10 pain- OT offering intermittent rest breaks, repositioning, and therapeutic support to optimize participation in therapy session. Focused this session on activity tolerance, functional cognition, L side attention, and Pt education. Transported Pt total  A to therapy gym in wc for time management. Re-educated Pt on  sternal precautions with Pt able to recall need to "stay in a tube" with min questioning cues and recall hand placement for sit<>stand. Engaged Pt in short distance functional mobility using RW for endurance training to improve overall activity tolerance for BADLs. Pt completed sit>stand with min A to power up. Pt able to tolerate ambulating ~19ft using RW with light min A for balance and RW management x2 trials with prolonged rest breaks provided between and following trials. Engaged Pt in standing functional cognition activity to work on standing tolerance and cognitive skills required for BADLs. Pt instructed to replicate simple picture pattern using small pegs and peg board. Pt able to tolerate ~45 seconds before requesting rest break- remainder of task completed in sitting for energy conservation d/t standing too challenging at this time. Positioned peg board on L side of Pt to increase visual attention and visual scanning to L side. Pt requiring max verbal/tactile cues to attend to L side of peg board to replicate pattern d/t L inattention, however Pt able to replicate R side of picture with significantly increased amount of time and mod verbal cues for attention to task. Transported Pt back to room total A in wc for energy conservation. Pt was left resting in wc with call bell in reach, seat belt alarm on, and all needs met.    Therapy Documentation Precautions:  Precautions Precautions: Sternal, Fall Precaution Comments: Verbal education provided during eval. Pt was able to recall one sternal precautions only. Restrictions Weight Bearing Restrictions: Yes RUE Weight Bearing: Non weight bearing LUE Weight Bearing: Non weight bearing Other Position/Activity Restrictions: sternal precaution   Therapy/Group: Individual Therapy  Clide Deutscher 04/11/2023, 7:52 AM

## 2023-04-11 NOTE — Progress Notes (Signed)
Patient ID: Dale Mills, male   DOB: Sep 29, 1965, 57 y.o.   MRN: [id number] Met with the patient to review current situation, rehab process, team conference and plan of care. Reviewed secondary risks management including HTN, and high triglycerides with iron deficiency and low albumin.  Patient noted understanding of IV abx through 05/12/23 and sternal precautions. Anxious to go home but noted limited by fatigue and weakness. Reported daughter's home is handicapped accessible but looking for ALF for discharge. Continue to follow along to address educational needs to facilitate preparation for discharge. Pamelia Hoit

## 2023-04-11 NOTE — Progress Notes (Signed)
ANTICOAGULATION CONSULT NOTE- follow-up  Pharmacy Consult for warfarin Indication: atrial fibrillation  No Known Allergies  Patient Measurements: Height: 6' (182.9 cm) Weight: 114.6 kg (252 lb 10.4 oz) IBW/kg (Calculated) : 77.6 Heparin Dosing Weight: 100 kg  Vital Signs: Temp: 98.4 F (36.9 C) (10/01 0328) Temp Source: Oral (10/01 0328) BP: 129/78 (10/01 0328) Pulse Rate: 92 (10/01 0328)  Labs: Recent Labs    04/09/23 0440 04/10/23 1100 04/11/23 0337  HGB 7.9*  --  7.5*  HCT 25.7*  --  24.7*  PLT 304  --  336  LABPROT 23.5* 24.7* 25.0*  INR 2.1* 2.2* 2.2*  CREATININE 0.72 0.88  --     Estimated Creatinine Clearance: 121 mL/min (by C-G formula based on SCr of 0.88 mg/dL).   Medical History: Past Medical History:  Diagnosis Date   Angina pectoris (HCC) [place] classification 2 04/10/2020   Anxiety    Arthritis    Coronary artery disease coronary CT angio did not show any major issues, suspicion for distal disease 06/12/2020   Depression    DJD (degenerative joint disease)    Dyslipidemia 02/13/2020   Essential hypertension 02/13/2020   Precordial chest pain 02/13/2020    Medications:  Scheduled:   (feeding supplement) PROSource Plus  30 mL Oral BID BM   amiodarone  400 mg Oral BID   ascorbic acid  500 mg Oral Daily   aspirin EC  81 mg Oral Daily   atorvastatin  80 mg Oral Daily   Chlorhexidine Gluconate Cloth  6 each Topical Q12H   [START ON 04/12/2023] COVID-19 mRNA vaccine (Pfizer)  0.3 mL Intramuscular Once   enoxaparin (LOVENOX) injection  120 mg Subcutaneous Q12H   ezetimibe  10 mg Oral Daily   Fe Fum-Vit C-Vit B12-FA  1 capsule Oral QPC breakfast   furosemide  40 mg Oral Daily   insulin aspart  2-6 Units Subcutaneous TID AC & HS   insulin detemir  6 Units Subcutaneous Daily   metoprolol tartrate  12.5 mg Oral BID   nutrition supplement (JUVEN)  1 packet Oral BID BM   mouth rinse  15 mL Mouth Rinse 4 times per day   pantoprazole  40 mg Oral  Daily   Ensure Max Protein  11 oz Oral BID   sodium chloride flush  10-40 mL Intracatheter Q12H   tamsulosin  0.4 mg Oral BID   traZODone  50 mg Oral QHS   Warfarin - Pharmacist Dosing Inpatient   Does not apply q1600   Infusions:   ampicillin (OMNIPEN) IV 2 g (04/11/23 0241)   cefTRIAXone (ROCEPHIN)  IV 2 g (04/10/23 2006)    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 Having recurrent Afib postop, pharmacy now consulted to start warfarin for Afib. No new DDIs identified, patient remains on amiodarone and ampicillin which may increase serum warfarin.  Acute DVT noted in the left internal jugular and a superficial thrombosis of left upper arm.  Pt has been refusing SCD's.  Given subtherapeutic INR , heparin drip started on 9/26. 9/27 changed heparin infusion to enoxaparin per pharmacy consult in conversation with CT surgery PA.    04/10/2023 INR remains therapeutic at 2.2 on D# 7 of warfarin and he has transferred to Rehab. Patient is now eating less of his meals. Patient has received ~5 days of parenteral anticoagulation- although heparin was  started ~2100 on 9/26 and did not achieve a therapeutic level prior to switching to lovenox on 9/27. Would continue with lovenox at least until 10/1 AM assuming patients INR remains >2.   -last dose of lovenox given 9/29 06:23 -last warfarin given was 9/29 18:58  04/11/2023 AM update INR remains 2.2   Goal of Therapy:  INR 2-3   Plan: -Warfarin 4 mg PO daily -discontinue lovenox 120 mg [place] q12h after all doses on 04/11/2023 have been given -Daily INR, CBC every 3 days  Greta Doom BS, PharmD, BCPS Clinical Pharmacist 04/11/2023 7:48 AM  Contact: [phone] after 3 PM  "Be curious, not judgmental..." -Debbora Dus

## 2023-04-11 NOTE — Plan of Care (Signed)
  Problem: RH Bed to Chair Transfers Goal: LTG Patient will perform bed/chair transfers w/assist (PT) Description: LTG: Patient will perform bed to chair transfers with assistance (PT). Flowsheets (Taken 04/11/2023 1201) LTG: Pt will perform Bed to Chair Transfers with assistance level: Supervision/Verbal cueing   Problem: RH Balance Goal: LTG Patient will maintain dynamic sitting balance (PT) Description: LTG:  Patient will maintain dynamic sitting balance with assistance during mobility activities (PT) Flowsheets (Taken 04/11/2023 1201) LTG: Pt will maintain dynamic sitting balance during mobility activities with:: Independent Goal: LTG Patient will maintain dynamic standing balance (PT) Description: LTG:  Patient will maintain dynamic standing balance with assistance during mobility activities (PT) 04/11/2023 1258 by [name], Student-PT Flowsheets (Taken 04/11/2023 1258) LTG: Pt will maintain dynamic standing balance during mobility activities with:: Contact Guard/Touching assist 04/11/2023 1201 by [name], Student-PT Flowsheets (Taken 04/11/2023 1201) LTG: Pt will maintain dynamic standing balance during mobility activities with:: Supervision/Verbal cueing   Problem: Sit to Stand Goal: LTG:  Patient will perform sit to stand with assistance level (PT) Description: LTG:  Patient will perform sit to stand with assistance level (PT) Flowsheets (Taken 04/11/2023 1201) LTG: PT will perform sit to stand in preparation for functional mobility with assistance level: Supervision/Verbal cueing   Problem: RH Bed Mobility Goal: LTG Patient will perform bed mobility with assist (PT) Description: LTG: Patient will perform bed mobility with assistance, with/without cues (PT). Flowsheets (Taken 04/11/2023 1201) LTG: Pt will perform bed mobility with assistance level of: Contact Guard/Touching assist   Problem: RH Bed to Chair Transfers Goal: LTG Patient will perform bed/chair transfers  w/assist (PT) Description: LTG: Patient will perform bed to chair transfers with assistance (PT). Flowsheets (Taken 04/11/2023 1201) LTG: Pt will perform Bed to Chair Transfers with assistance level: Supervision/Verbal cueing   Problem: RH Car Transfers Goal: LTG Patient will perform car transfers with assist (PT) Description: LTG: Patient will perform car transfers with assistance (PT). Flowsheets (Taken 04/11/2023 1201) LTG: Pt will perform car transfers with assist:: Contact Guard/Touching assist   Problem: RH Ambulation Goal: LTG Patient will ambulate in controlled environment (PT) Description: LTG: Patient will ambulate in a controlled environment, # of feet with assistance (PT). Flowsheets (Taken 04/11/2023 1201) LTG: Pt will ambulate in controlled environ  assist needed:: Contact Guard/Touching assist LTG: Ambulation distance in controlled environment: 150 Goal: LTG Patient will ambulate in home environment (PT) Description: LTG: Patient will ambulate in home environment, # of feet with assistance (PT). Flowsheets (Taken 04/11/2023 1201) LTG: Pt will ambulate in home environ  assist needed:: Contact Guard/Touching assist LTG: Ambulation distance in home environment: 50

## 2023-04-11 NOTE — Progress Notes (Signed)
Speech Language Pathology Daily Session Note  Patient Details  Name: Dale Mills MRN: [id number] Date of Birth: 05-Sep-1965  Today's Date: 04/11/2023 SLP Individual Time: 1115-1200 SLP Individual Time Calculation (min): 45 min  Short Term Goals: Week 1: SLP Short Term Goal 1 (Week 1): Patient will attend to left visual field during functional tasks with Min verbal cues SLP Short Term Goal 2 (Week 1): Patient will utilize speech intelligibility strategies at the sentence level to achieve 100% intelligibility with Min verbal cues. SLP Short Term Goal 3 (Week 1): Patient will demonstrate selective attention to tasks in a mildly distracting enviornment for 15 minutes with Min verbal cues for redirection. SLP Short Term Goal 4 (Week 1): Patient will recall novel information with 80% acc given min A SLP Short Term Goal 5 (Week 1): Patient will demonstrate ability to problem solve in mildly complex functional situations with min multimodal A  Skilled Therapeutic Interventions: Skilled therapy session focused on cognitive goals. SLP facilitated session by providing max A during identification of medication mistakes task. Patient required max visual and tactile cues to attend to L side of BID pillbox in order to identify mistakes. Due to significant difficulty as a result of L inattention, task was d/c. SLP then addressed simple L attention tasks including review of schedule and trained patient on L to R scanning. Patient able to complete with modA. Patient continent of bladder at the conclusion of the session. Patient left in chair with alarm set and call bell in reach. Continue POC.   Pain No pain reported   Therapy/Group: Individual Therapy  Nikoli Nasser M.A., CF-SLP 04/11/2023, 12:19 PM

## 2023-04-11 NOTE — Progress Notes (Signed)
PROGRESS NOTE   Subjective/Complaints: Per physical therapy the patient did not ambulate as far as he usually did during his acute care stay.  The patient was questioned about this.  He states that he had just finished ADL program and was a bit tired from that. No new chest pains no new shortness of breath.  Has some lower extremity swelling  Review of Systems  Constitutional:  Negative for chills, fever and malaise/fatigue.  Eyes:  Positive for blurred vision.  Respiratory:  Positive for shortness of breath.   Cardiovascular:  Positive for chest pain.  Gastrointestinal:  Positive for diarrhea.  Musculoskeletal:  Positive for joint pain.  Skin:  Negative for rash.  Neurological:  Positive for weakness.  Psychiatric/Behavioral:  The patient has insomnia.     Objective:   No results found. Recent Labs    04/09/23 0440 04/11/23 0337  WBC 9.0 7.2  HGB 7.9* 7.5*  HCT 25.7* 24.7*  PLT 304 336   Recent Labs    04/09/23 0440 04/10/23 1100  NA 135 132*  K 3.8 3.5  CL 102 102  CO2 24 22  GLUCOSE 112* 129*  BUN 9 8  CREATININE 0.72 0.88  CALCIUM 8.4* 8.6*    Intake/Output Summary (Last 24 hours) at 04/11/2023 0824 Last data filed at 04/11/2023 0040 Gross per 24 hour  Intake 757.13 ml  Output 2075 ml  Net -1317.87 ml        Physical Exam: Vital Signs Blood pressure 129/78, pulse 92, temperature 98.4 F (36.9 C), temperature source Oral, resp. rate 16, height 6' (1.829 m), weight 114.6 kg, SpO2 100%.  General: No acute distress Mood and affect are appropriate Heart: Regular rate and rhythm no rubs murmurs or extra sounds Lungs: Clear to auscultation, breathing unlabored, no rales or wheezes Abdomen: Positive bowel sounds, soft nontender to palpation, nondistended Extremities: No clubbing, cyanosis, 2+ edema left pretibial 1+ edema right pretibial edema Skin: No evidence of breakdown, no evidence of  rash Neurologic: Cranial nerves II through XII intact, motor strength is 5/5 in bilateral deltoid, bicep, tricep, grip, 4/5 hip flexor, knee extensors, ankle dorsiflexor and plantar flexor  Cerebellar exam normal finger to nose to finger as well as heel to shin in bilateral upper and lower extremities Left visual field cut with neglect partially compensated Musculoskeletal: Full range of motion in all 4 extremities. No joint swelling    Assessment/Plan: 1. Functional deficits which require 3+ hours per day of interdisciplinary therapy in a comprehensive inpatient rehab setting. Physiatrist is providing close team supervision and 24 hour management of active medical problems listed below. Physiatrist and rehab team continue to assess barriers to discharge/monitor patient progress toward functional and medical goals  Care Tool:  Bathing    Body parts bathed by patient: Left arm, Chest, Right arm, Abdomen, Front perineal area, Buttocks, Face   Body parts bathed by helper: Right lower leg, Left lower leg, Left upper leg, Right upper leg     Bathing assist Assist Level: Moderate Assistance - Patient 50 - 74%     Upper Body Dressing/Undressing Upper body dressing   What is the patient wearing?: Hospital gown only  Upper body assist Assist Level: Moderate Assistance - Patient 50 - 74%    Lower Body Dressing/Undressing Lower body dressing      What is the patient wearing?: Pants, Underwear/pull up     Lower body assist Assist for lower body dressing: Maximal Assistance - Patient 25 - 49%     Toileting Toileting    Toileting assist Assist for toileting: Maximal Assistance - Patient 25 - 49%     Transfers Chair/bed transfer  Transfers assist     Chair/bed transfer assist level: Minimal Assistance - Patient > 75%     Locomotion Ambulation   Ambulation assist              Walk 10 feet activity   Assist           Walk 50 feet activity   Assist            Walk 150 feet activity   Assist           Walk 10 feet on uneven surface  activity   Assist           Wheelchair     Assist               Wheelchair 50 feet with 2 turns activity    Assist            Wheelchair 150 feet activity     Assist          Blood pressure 129/78, pulse 92, temperature 98.4 F (36.9 C), temperature source Oral, resp. rate 16, height 6' (1.829 m), weight 114.6 kg, SpO2 100%.   Medical Problem List and Plan: 1. 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             -patient may  shower             -ELOS/Goals: 10-14d, PT/OT/SLP Sup  -Continue CIR 2.  L-internal jugular DVT/ A fib/Antithrombotics: -DVT/anticoagulation:  Pharmaceutical:LUE cephalic and internal jugular DVT Coumadin and Heparin bridge to change to lovenox at discharge             -antiplatelet therapy: ASA 3. Pain Management:  oxycodone or tramadol prn for pain.  4. Mood/Behavior/Sleep: LCSW to follow for evaluation and support.              -antipsychotic agents: N/A             -- Order melatonin prn for insomnia.  Schedule trazodone nightly 5. Neuropsych/cognition: This patient is capable of making decisions on his own behalf. 6. Skin/Wound Care: Routine pressure relief measures.  7. Fluids/Electrolytes/Nutrition: Strict I/O. Check CMET in am.  8. 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 Fluid overload/SOB: Lasix 40 mg decreased to daily on 09/25 --developed cough last nigh with SOB 09/27 pm.  --daily  weights with monitoring for signs of overload. .  -wt stable, monitor    Filed Weights   04/09/23 1558 04/10/23 0409 04/11/23 0328  Weight: 115.8 kg 115 kg 114.6 kg    10. A fib: Continue on amiodarone  400 mg bid since 09/25-->taper? May need to contact cards for this  11. Pre diabetes/Stress induced hyperglycemia: Monitor BS ac/hs.  --Hgb A1C 6.2 (prediabetic). --diet changed to CM from regular. Continue Levemir 8 units daily with SSI prn for tighter BS control. .  --Transition insulin to metformin as intake improves.   -9/30 decrease levemir to 6 u CBG (last 3)  Recent Labs    04/10/23 1618 04/10/23 2002 04/11/23 0615  GLUCAP 99 97 101*    12 ABLA:   Monitor for signs of bleeding             --transfuse prn <7.0 or if symptomatic.   -9/30 hemoglobin stable at 7.9 yesterday CBC    Component Value Date/Time   WBC 7.2 04/11/2023 0337   RBC 2.78 (L) 04/11/2023 0337   HGB 7.5 (L) 04/11/2023 0337   HCT 24.7 (L) 04/11/2023 0337   PLT 336 04/11/2023 0337   MCV 88.8 04/11/2023 0337   MCH 27.0 04/11/2023 0337   MCHC 30.4 04/11/2023 0337   RDW 17.5 (H) 04/11/2023 0337      13. Constipation: Resolved and now w/diarrhea. Will hold laxative.    -9/30 frequent BM not liquid, continue to hold laxatives  14. Hyponatremia,mild  -Recheck thursday LOS: 2 days A FACE TO FACE EVALUATION WAS PERFORMED  Erick Colace 04/11/2023, 8:24 AM

## 2023-04-12 ENCOUNTER — Inpatient Hospital Stay (HOSPITAL_COMMUNITY): Payer: Medicaid Other

## 2023-04-12 LAB — GLUCOSE, CAPILLARY
Glucose-Capillary: 105 mg/dL — ABNORMAL HIGH (ref 70–99)
Glucose-Capillary: 133 mg/dL — ABNORMAL HIGH (ref 70–99)
Glucose-Capillary: 112 mg/dL — ABNORMAL HIGH (ref 70–99)
Glucose-Capillary: 134 mg/dL — ABNORMAL HIGH (ref 70–99)

## 2023-04-12 LAB — PROTIME-INR
INR: 2.4 — ABNORMAL HIGH (ref 0.8–1.2)
Prothrombin Time: 26.2 s — ABNORMAL HIGH (ref 11.4–15.2)

## 2023-04-12 MED ORDER — FUROSEMIDE 10 MG/ML IJ SOLN
40.0000 mg | Freq: Once | INTRAMUSCULAR | Status: AC
  Administered 2023-04-12: 40 mg via INTRAVENOUS
  Filled 2023-04-12: qty 4

## 2023-04-12 MED ORDER — TAMSULOSIN HCL 0.4 MG PO CAPS
0.4000 mg | ORAL_CAPSULE | Freq: Every day | ORAL | Status: DC
  Administered 2023-04-12 – 2023-04-25 (×14): 0.4 mg via ORAL
  Filled 2023-04-12 (×14): qty 1

## 2023-04-12 NOTE — Progress Notes (Signed)
Occupational Therapy Session Note  Patient Details  Name: Dale Mills MRN: [id number] Date of Birth: 1965-11-27  Today's Date: 04/12/2023 OT Individual Time: 1103-1200 OT Individual Time Calculation (min): 57 min    Short Term Goals: Week 1:  OT Short Term Goal 1 (Week 1): Pt will be able to recall sternal precautions during self care tasks 3/5 sessions with minimal cueing. OT Short Term Goal 2 (Week 1): Pt will complete LB dressing with Mod A utilizing AE as needed. OT Short Term Goal 3 (Week 1): Pt will completed LB bathing with Min A utilizing AE as needed.  Skilled Therapeutic Interventions/Progress Updates:     Pt received reclined in bed sitting up on bed pant upon OT arrival- increased time provided on bed pant d/t Pt reporting need to have BM, however no success at this time. Pt presenting with flat affect, however to be in good spirits receptive to skilled OT session reporting 0/10 pain- OT offering intermittent rest breaks, repositioning, and therapeutic support to optimize participation in therapy session.   Engaged Pt in completing rolling in bed R<>L to donn clean brief and pants with mod verbal cues required for technique and to maintain sternal precautions. Pt wearing thigh high TEDs upon OT arrival. Focused beginning of session on obtaining orthostatic vitals.   Vitals supine: HR 93 PSO2 100% BP 112/76(88) Vitals sitting EOB: HR 92 PSO2 98% BP 119/73(86) Vitals in standing: HR 110 PSO2 100% BP 105/82 (91  Pt transitioned to EOB with mod A to bring legs to EOB d/t motor planning challenges and mod verbal cues to for technique and to maintain sternal precautions. Pt completed sit<>stands with min A and mod verbal cues for hand placement over chest. Stand step using RW min A for balance and RW management. Transported Pt total A to therapy gym in wc for time management.  Pt completed stand step wc>NuStep using RW with min A for balance and mod verbal cues provided during  sit<>stand for hand placement.    Engaged Pt in NuStep training for endurance training to increase activity tolerance for BADLs and functional transfers. HR closely monitored throughout endurance training with HR maintaining between 95-100 BPM. Pt tolerated completing 3 minutes on level 1 setting at step rate of 20-25 SPM. Pt with increased time required for rest breaks between trials and following activity.   Stand step NuStep>wc using RW. Transported Pt back to room total A in wc. Pt continuing to present with significant endurance challenges and cognitive deficits impacting his functional transfers and BADLs. Encouraged water intake throughout session with Pt receptive to drinking entire 8 oz cup. Pt was left resting in c with call bell in reach, seat belt alarm on, and all needs met.    Therapy Documentation Precautions:  Precautions Precautions: Sternal, Fall Precaution Comments: Verbal education provided during eval. Pt able to verbalize he "doesn't need to open up the incision w/ movement" Restrictions Weight Bearing Restrictions: Yes RUE Weight Bearing: Non weight bearing LUE Weight Bearing: Non weight bearing Other Position/Activity Restrictions: sternal precaution   Therapy/Group: Individual Therapy  Clide Deutscher 04/12/2023, 7:54 AM

## 2023-04-12 NOTE — Progress Notes (Signed)
Physical Therapy Session Note  Patient Details  Name: Dale Mills MRN: [id number] Date of Birth: 1965-12-11  Today's Date: 04/12/2023 PT Individual Time: 0820-0915 PT Individual Time Calculation (min): 55 min  and Today's Date: 04/12/2023 PT Missed Time: 20 Minutes Missed Time Reason: Other (Comment) (eating)  Short Term Goals: Week 1:  PT Short Term Goal 1 (Week 1): Pt will STS CGA w/ LRAD PT Short Term Goal 2 (Week 1): Pt will ambulate 50 ft modA w/ LRAD PT Short Term Goal 3 (Week 1): Pt will navigate ramp modA w/ LRAD PT Short Term Goal 4 (Week 1): Pt w/ perform bed to chair transfer w/ minA using LRAD PT Short Term Goal 5 (Week 1): Pt will require minA for bed mobility  Skilled Therapeutic Interventions/Progress Updates:    *Pt requested to finish eating breakfast at beginning of session.  Pt received in bed, alarm activated, agreeable to PT.  MinA bed mobility from supine to sitting on EOB. Use of elevated HOB.  ModA STS throughout session from variable surfaces(WC, toilet, bed), RW once in standing. Continuous verbal cues (cross arms across the chest) to maintain precautions. Sitting in WC: 105 bpm, 96% O2  ~15 ft modA from bed to toilet using RW. Pt continent to bladder, modA for hygiene in standing for postural stability. ~10 ft modA from toilet to WC using RW. +2 for line management to ensure pt safety. Intermittent pt demo wanting to bend down and rest elbows onto walker to d/t fatigue before being asked to sit down.  Pt dependently transported from room to gym for energy conservation.  Ramp navigation w/ RW, modA for postural stability. Pt demo increased dyspnea and in need of a rest break at top of ramp. Pt reports feeling more stressed from increased amount of therapy here in CIR compared to acute care. Sitting at top of ramp: 108 bpm , 98% O2  Throughout session pt reports dizziness and increased fatigue. Demo dyspnea w/ short ambulation and standing. Sitting: BP  108/73, 84 MAP, 101 bpm, 100% o2 Standing BP 95/50, 64 MAP, 108 bpm, 100% o2  RN and NT notified of BP.  ~15 ft from WC to recliner using RW, modA for postural stability.  Pt left in recliner, alarm activated, all needs met.  Therapy Documentation Precautions:  Precautions Precautions: Sternal, Fall Precaution Comments: Verbal education provided during eval. Pt able to verbalize he "doesn't need to open up the incision w/ movement" Restrictions Weight Bearing Restrictions: Yes RUE Weight Bearing: Non weight bearing LUE Weight Bearing: Non weight bearing Other Position/Activity Restrictions: sternal precaution Pain:  Pt reports no pain throughout session.  Therapy/Group: Individual Therapy  [name] 04/12/2023, 7:54 AM

## 2023-04-12 NOTE — Progress Notes (Signed)
Speech Language Pathology Daily Session Note  Patient Details  Name: Dale Mills MRN: [id number] Date of Birth: 07/04/1966  Today's Date: 04/12/2023 SLP Individual Time: 1345-1444 SLP Individual Time Calculation (min): 59 min  Short Term Goals: Week 1: SLP Short Term Goal 1 (Week 1): Patient will attend to left visual field during functional tasks with Min verbal cues SLP Short Term Goal 2 (Week 1): Patient will utilize speech intelligibility strategies at the sentence level to achieve 100% intelligibility with Min verbal cues. SLP Short Term Goal 3 (Week 1): Patient will demonstrate selective attention to tasks in a mildly distracting enviornment for 15 minutes with Min verbal cues for redirection. SLP Short Term Goal 4 (Week 1): Patient will recall novel information with 80% acc given min A SLP Short Term Goal 5 (Week 1): Patient will demonstrate ability to problem solve in mildly complex functional situations with min multimodal A  Skilled Therapeutic Interventions: Skilled therapy session focused on cognitive goals, specifically L neglect/inattention. SLP provided modA during L scanning activities including placing 'days of the week' and 'months of the year' in order after scanning the table for each. Patient required fewer cues as the session progressed, as he was able to identify all symbols in a picture with minA. SLP addressed memory goals through review of sternal precautions. Patient independently able to recall he was not supposed to "push or pull," however requried re-education on the remainder. After a 30 minute time delay, patient able to recall  precautions with minA.  At the end of the session, patient began to fall asleep during completion of tasks, requring verbal and tactile stimulation to arouse. Patient left in [name] with alarm set and call bell in reach.  Pain No pain reported   Therapy/Group: Individual Therapy  Naina Sleeper M.A., CF-SLP 04/12/2023, 2:44 PM

## 2023-04-12 NOTE — Progress Notes (Addendum)
Patient ID: Dale Mills, male   DOB: May 21, 1966, 57 y.o.   MRN: [id number]  Team Conference Report to Patient/Family  Team Conference discussion was reviewed with the patient and caregiver, including goals, any changes in plan of care and target discharge date.  Patient and caregiver express understanding and are in agreement.  The patient has a target discharge date of 04/25/23.   11:23 AM:  Sw left detailed VM with daughter, Dale Mills to provide team conference updates and confirm d/c plan.  Sw met with patient and provided team conference updates. Sw informed patient of current d/c date. Sw informed pt SW reached out to his daughter, Dale Mills. Pt prefers SW reach out to daughter Hospital doctor. Pt and daughter prefer Dale Mills as primary contact. Dale Mills is local.  SW reached out to pt's daughter, Hospital doctor. Sw provided daughter with team conference updates and informed daughter of the current date set from the team. Daughter has expressed that currently that they do not have a discharge plan. Daughter shared that she was informed by Dale Mills that they would be able to transition to ALF/SNF at discharge. PATIENT IS UNINSURED/DOES NOT HAVE A PAYER SOURCE. Daughters unable to pay privately for ALF & SNF and are currently in the process of applying for Medicaid. Sw informed daughter Medicaid process can run up into 60-90 days.  SW informed daughter that patient will be unable to remain on the unit until Medicaid is in place. Sw expressed to daughter there may be potential that patient will have to discharge home with her until Medicaid is in place and then look for placement.  Daughter expressed she and her spouse are disable and her sister lives and works in [place]. Daughter expressed she does not feel as patient discharging home with her will not be a safe d/c due to her disability , 24/7 supervision and her using a WC.   Daughter became tearful and expressed she was unaware of the process. Daughter expressed  she was told by Dale Mills to begin ALF bed searches but was unaware of the barrier of Medicaid/payor source needed. Daughter expressed she is unsure what to do or what the plan will be moving forward. Sw will continue to follow up with daughter. No additional questions or concerns.  Dale Mills 04/12/2023, 1:56 PM

## 2023-04-12 NOTE — Patient Care Conference (Signed)
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 Claudette Laws Social Worker Present: Lavera Guise, BSW Nurse Present: Chana Bode, RN;Other (comment) Janina Mayo) PT Present: Casimiro Needle, PT OT Present: Bonnell Public, OT SLP Present: Everardo Pacific, SLP     Current Status/Progress Goal Weekly Team Focus  Bowel/Bladder   conitnent with episodes of incontinences; LBM: 10/1   offer toileting when awake   assist with toileting needs prn    Swallow/Nutrition/ Hydration               ADL's   min UB BADLs, mod-max LB BADLs/toileting; Barriers: decreased activity tolerance, functional cognition deficits, difficulty adhering to sternal precautions   supervision overall   endurance, BADL retraining, functional mobility, functional cognition    Mobility   modA bed mobility, min/mod A STS, modA bed to chair using RW, gait 21 ft modA using RW, increased fatigue and dyspnea - elevated HR - significant cardiopulmonary endurance - L inattention/visual field cu   supervision/CGA ambulatory level  endurance, LE strength, dynamic balance, functional mobility, ambulation -    Communication   ~75% intelligible at sentence/conversational level   supervision   education on compensatory strategies    Safety/Cognition/ Behavioral Observations  significant L inattention during functional tasks, mild  memory impairments according to standardized testing   supervision   L inattention strategies, memory strategies, problem solving tasks    Pain   no c/o pain   remain pain free   assess pain QS and prn    Skin   midline incison, OTA   remain free of new skin breakdown/infection  assess skin QS and prn      Discharge Planning:  Patient uninsured with plan to d/c to his daughters home or ALF.  1 level homr, ramped entrance   Team Discussion: Patient with tachycardia, hemianopsia, cognitive deficits and perceptual deficits with left neglect/inattention and poor activity tolerance post aortic/mitral valve replacement surgery.  Patient on target to meet rehab goals: yes, currently needs min assist for upper body ADLs and mod max assist for lower body care. Limited by orthostasis; binder and TTEDs added.  Goals for discharge set for supervision overall.  *See Care Plan and progress notes for long and short-term goals.   Revisions to Treatment Plan:   N/A  Teaching Needs: Safety, medications, skin care, dietary modifications, transfers, toileting, etc  Current Barriers to Discharge: Home enviroment access/layout, Lack of/limited family support, Insurance for SNF coverage, and lack of insurance for follow up DME/follow up services  and IV abx thru 05/12/23.  Possible Resolutions to Barriers: Family education HH follow up services     Medical Summary               I attest that I was present, lead the team conference, and concur with the assessment and plan of the team.   Pamelia Hoit 04/12/2023, 5:30 PM

## 2023-04-12 NOTE — Progress Notes (Signed)
   04/12/23 1600  Spiritual Encounters  Type of Visit Initial  Care provided to: Pt and family  Referral source Family  Reason for visit Routine spiritual support  OnCall Visit No   Ch responded to request for support. Pt's daughter was present at bedside. She wanted Ch to notarized pt's will. Ch informed the family that we do not notarize anything that did not originate from the hospital. Ch remain available when needed.

## 2023-04-12 NOTE — Plan of Care (Signed)
  Problem: Consults Goal: RH STROKE PATIENT EDUCATION Description: See Patient Education module for education specifics  Outcome: Progressing   Problem: RH SAFETY Goal: RH STG ADHERE TO SAFETY PRECAUTIONS W/ASSISTANCE/DEVICE Description: STG Adhere to Safety Precautions With cues Assistance/Device. Outcome: Progressing   Problem: RH KNOWLEDGE DEFICIT Goal: RH STG INCREASE KNOWLEDGE OF HYPERTENSION Description: Patient and daughter will be able to manage HTN with medications and dietary modifications using educational resources independently Outcome: Progressing Goal: RH STG INCREASE KNOWLEGDE OF HYPERLIPIDEMIA Description: Patient and daughter will be able to manage HLD with medications and dietary modifications using educational resources independently Outcome: Progressing Goal: RH STG INCREASE KNOWLEDGE OF STROKE PROPHYLAXIS Description: Patient and daughter will be able to manage secondary risks with medications and dietary modifications using educational resources independently Outcome: Progressing   Problem: Education: Goal: Will demonstrate proper wound care and an understanding of methods to prevent future damage Outcome: Progressing Goal: Knowledge of disease or condition will improve Outcome: Progressing Goal: Knowledge of the prescribed therapeutic regimen will improve Outcome: Progressing Goal: Individualized Educational Video(s) Outcome: Progressing   Problem: Activity: Goal: Risk for activity intolerance will decrease Outcome: Progressing   Problem: Cardiac: Goal: Will achieve and/or maintain hemodynamic stability Outcome: Progressing   Problem: Clinical Measurements: Goal: Postoperative complications will be avoided or minimized Outcome: Progressing   Problem: Respiratory: Goal: Respiratory status will improve Outcome: Progressing   Problem: Skin Integrity: Goal: Wound healing without signs and symptoms of infection Outcome: Progressing Goal: Risk for  impaired skin integrity will decrease Outcome: Progressing   Problem: Urinary Elimination: Goal: Ability to achieve and maintain adequate renal perfusion and functioning will improve Outcome: Progressing   Problem: Activity: Goal: Ability to tolerate increased activity will improve Outcome: Progressing   Problem: Respiratory: Goal: Ability to maintain a clear airway and adequate ventilation will improve Outcome: Progressing   Problem: Role Relationship: Goal: Method of communication will improve Outcome: Progressing

## 2023-04-12 NOTE — Progress Notes (Signed)
PROGRESS NOTE   Subjective/Complaints: Poor endurance with PT, no CP , anemia is stable per labs, no c/os, per OT has sig L neglect   Review of Systems  Constitutional:  Negative for chills, fever and malaise/fatigue.  Eyes:  Positive for blurred vision.  Respiratory:  Positive for shortness of breath.   Cardiovascular:  Positive for chest pain.  Gastrointestinal:  Positive for diarrhea.  Musculoskeletal:  Positive for joint pain.  Skin:  Negative for rash.  Neurological:  Positive for weakness.  Psychiatric/Behavioral:  The patient has insomnia.     Objective:   No results found. Recent Labs    04/11/23 0337  WBC 7.2  HGB 7.5*  HCT 24.7*  PLT 336   Recent Labs    04/10/23 1100  NA 132*  K 3.5  CL 102  CO2 22  GLUCOSE 129*  BUN 8  CREATININE 0.88  CALCIUM 8.6*    Intake/Output Summary (Last 24 hours) at 04/12/2023 1854 Last data filed at 04/12/2023 1700 Gross per 24 hour  Intake 480 ml  Output 804 ml  Net -324 ml        Physical Exam: Vital Signs Blood pressure 109/73, pulse 90, temperature 98.3 F (36.8 C), resp. rate 18, height 6' (1.829 m), weight 115.4 kg, SpO2 94%.  General: No acute distress Mood and affect are appropriate Heart: Regular rate and rhythm no rubs murmurs or extra sounds Lungs: Clear to auscultation, breathing unlabored, no rales or wheezes Abdomen: Positive bowel sounds, soft nontender to palpation, nondistended Extremities: No clubbing, cyanosis, 2+ edema left pretibial 1+ edema right pretibial edema Skin: No evidence of breakdown, no evidence of rash Neurologic: Cranial nerves II through XII intact, motor strength is 5/5 in bilateral deltoid, bicep, tricep, grip, 4/5 hip flexor, knee extensors, ankle dorsiflexor and plantar flexor No changes Left visual field cut with neglect partially compensated Musculoskeletal: Full range of motion in all 4 extremities. No joint  swelling    Assessment/Plan: 1. Functional deficits which require 3+ hours per day of interdisciplinary therapy in a comprehensive inpatient rehab setting. Physiatrist is providing close team supervision and 24 hour management of active medical problems listed below. Physiatrist and rehab team continue to assess barriers to discharge/monitor patient progress toward functional and medical goals  Care Tool:  Bathing    Body parts bathed by patient: Left arm, Chest, Right arm, Abdomen, Front perineal area, Buttocks, Face   Body parts bathed by helper: Right lower leg, Left lower leg, Left upper leg, Right upper leg     Bathing assist Assist Level: Moderate Assistance - Patient 50 - 74%     Upper Body Dressing/Undressing Upper body dressing   What is the patient wearing?: Hospital gown only    Upper body assist Assist Level: Moderate Assistance - Patient 50 - 74%    Lower Body Dressing/Undressing Lower body dressing      What is the patient wearing?: Pants, Underwear/pull up     Lower body assist Assist for lower body dressing: Maximal Assistance - Patient 25 - 49%     Toileting Toileting    Toileting assist Assist for toileting: Maximal Assistance - Patient 25 - 49%  Transfers Chair/bed transfer  Transfers assist     Chair/bed transfer assist level: Moderate Assistance - Patient 50 - 74%     Locomotion Ambulation   Ambulation assist      Assist level: Moderate Assistance - Patient 50 - 74% Assistive device: Walker-rolling Max distance: 21 ft   Walk 10 feet activity   Assist     Assist level: Moderate Assistance - Patient - 50 - 74% Assistive device: Walker-rolling   Walk 50 feet activity   Assist Walk 50 feet with 2 turns activity did not occur: Safety/medical concerns         Walk 150 feet activity   Assist Walk 150 feet activity did not occur: Safety/medical concerns         Walk 10 feet on uneven surface   activity   Assist Walk 10 feet on uneven surfaces activity did not occur: Safety/medical concerns         Wheelchair     Assist Is the patient using a wheelchair?: Yes Type of Wheelchair: Manual    Wheelchair assist level: Dependent - Patient 0%      Wheelchair 50 feet with 2 turns activity    Assist        Assist Level: Dependent - Patient 0%   Wheelchair 150 feet activity     Assist      Assist Level: Dependent - Patient 0%   Blood pressure 109/73, pulse 90, temperature 98.3 F (36.8 C), resp. rate 18, height 6' (1.829 m), weight 115.4 kg, SpO2 94%.   Medical Problem List and Plan: 1. 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             -patient may  shower             -ELOS/Goals: 10/15, PT/OT/SLP Sup  -Continue CIR 2.  L-internal jugular DVT/ A fib/Antithrombotics: -DVT/anticoagulation:  Pharmaceutical:LUE cephalic and internal jugular DVT Coumadin and Heparin bridge to change to lovenox at discharge             -antiplatelet therapy: ASA 3. Pain Management:  oxycodone or tramadol prn for pain.  4. Mood/Behavior/Sleep: LCSW to follow for evaluation and support.              -antipsychotic agents: N/A             -- Order melatonin prn for insomnia.  Schedule trazodone nightly 5. Neuropsych/cognition: This patient is capable of making decisions on his own behalf. 6. Skin/Wound Care: Routine pressure relief measures.  7. Fluids/Electrolytes/Nutrition: Strict I/O. Check CMET in am.  8. 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 Fluid overload/SOB: Lasix 40 mg decreased to daily on 09/25 --developed cough last nigh with SOB 09/27 pm.  --daily weights with monitoring for signs of overload. .  -wt stable, monitor Edema in legs will give IV lasix x 1 today.  CXR unchanged, cards to f/u Wts are stable   Filed Weights   04/10/23 0409 04/11/23 0328 04/12/23 0500  Weight: 115 kg 114.6 kg 115.4 kg    10. A  fib: Continue on amiodarone 400 mg bid since 09/25-->taper? May need to contact cards for this  11. Pre diabetes/Stress induced hyperglycemia: Monitor BS ac/hs.  --Hgb A1C 6.2 (prediabetic). --diet changed to CM from regular. Continue Levemir 8 units daily with SSI prn for tighter BS control. .  --Transition insulin to metformin as intake improves.   -9/30 decrease levemir to 6 u CBG (last 3)  Recent Labs    04/12/23 0637 04/12/23 1134 04/12/23 1706  GLUCAP 105* 133* 112*    12 ABLA:   Monitor for signs of bleeding             --transfuse prn <7.0 or if symptomatic. Unclear whether fatigue is related to anemia.  Walked furthre last week with similar Hgb  -9/30 hemoglobin stable at 7.9 yesterday CBC    Component Value Date/Time   WBC 7.2 04/11/2023 0337   RBC 2.78 (L) 04/11/2023 0337   HGB 7.5 (L) 04/11/2023 0337   HCT 24.7 (L) 04/11/2023 0337   PLT 336 04/11/2023 0337   MCV 88.8 04/11/2023 0337   MCH 27.0 04/11/2023 0337   MCHC 30.4 04/11/2023 0337   RDW 17.5 (H) 04/11/2023 0337      13. Constipation: Resolved and now w/diarrhea. Will hold laxative.    -9/30 frequent BM not liquid, continue to hold laxatives  14. Hyponatremia,mild  -Recheck thursday LOS: 3 days A FACE TO FACE EVALUATION WAS PERFORMED  Erick Colace 04/12/2023, 6:54 PM

## 2023-04-12 NOTE — Progress Notes (Signed)
ANTICOAGULATION CONSULT NOTE- follow-up  Pharmacy Consult for warfarin Indication: atrial fibrillation  No Known Allergies  Patient Measurements: Height: 6' (182.9 cm) Weight: 115.4 kg (254 lb 6.6 oz) IBW/kg (Calculated) : 77.6 Heparin Dosing Weight: 100 kg  Vital Signs: Temp: 98.5 F (36.9 C) (10/02 0319) Temp Source: Oral (10/02 0319) BP: 135/76 (10/02 0319) Pulse Rate: 89 (10/02 0319)  Labs: Recent Labs    04/10/23 1100 04/11/23 0337 04/12/23 0400  HGB  --  7.5*  --   HCT  --  24.7*  --   PLT  --  336  --   LABPROT 24.7* 25.0* 26.2*  INR 2.2* 2.2* 2.4*  CREATININE 0.88  --   --     Estimated Creatinine Clearance: 121.4 mL/min (by C-G formula based on SCr of 0.88 mg/dL).   Medical History: Past Medical History:  Diagnosis Date   Angina pectoris (HCC) [place] classification 2 04/10/2020   Anxiety    Arthritis    Coronary artery disease coronary CT angio did not show any major issues, suspicion for distal disease 06/12/2020   Depression    DJD (degenerative joint disease)    Dyslipidemia 02/13/2020   Essential hypertension 02/13/2020   Precordial chest pain 02/13/2020    Medications:  Scheduled:   (feeding supplement) PROSource Plus  30 mL Oral BID BM   amiodarone  400 mg Oral BID   ascorbic acid  500 mg Oral Daily   aspirin EC  81 mg Oral Daily   atorvastatin  80 mg Oral Daily   Chlorhexidine Gluconate Cloth  6 each Topical Q12H   COVID-19 mRNA vaccine (Pfizer)  0.3 mL Intramuscular Once   ezetimibe  10 mg Oral Daily   Fe Fum-Vit C-Vit B12-FA  1 capsule Oral QPC breakfast   furosemide  40 mg Oral Daily   insulin aspart  2-6 Units Subcutaneous TID AC & HS   insulin detemir  6 Units Subcutaneous Daily   metoprolol tartrate  12.5 mg Oral BID   nutrition supplement (JUVEN)  1 packet Oral BID BM   mouth rinse  15 mL Mouth Rinse 4 times per day   pantoprazole  40 mg Oral Daily   Ensure Max Protein  11 oz Oral BID   sodium chloride flush  10-40 mL  Intracatheter Q12H   tamsulosin  0.4 mg Oral BID   traZODone  50 mg Oral QHS   warfarin  4 mg Oral q1600   Warfarin - Pharmacist Dosing Inpatient   Does not apply q1600   Infusions:   ampicillin (OMNIPEN) IV 2 g (04/12/23 0610)   cefTRIAXone (ROCEPHIN)  IV 2 g (04/11/23 2057)    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 Having recurrent Afib postop, pharmacy now consulted to start warfarin for Afib. No new DDIs identified, patient remains on amiodarone and ampicillin which may increase serum warfarin.  Acute DVT noted in the left internal jugular and a superficial thrombosis of left upper arm.  Pt has been refusing SCD's.  Given subtherapeutic INR , heparin drip started on 9/26. 9/27 changed heparin infusion to enoxaparin per pharmacy consult in conversation with CT surgery PA.    04/10/2023 INR remains therapeutic at 2.2 on D# 7 of warfarin and he has transferred to Rehab. Patient is now eating less of his meals. Patient has received ~5 days of parenteral anticoagulation-  although heparin was started ~2100 on 9/26 and did not achieve a therapeutic level prior to switching to lovenox on 9/27. Would continue with lovenox at least until 10/1 AM assuming patients INR remains >2.   -last dose of lovenox given 9/29 06:23 -last warfarin given was 9/29 18:58  04/12/2023 AM update INR  2.4   Goal of Therapy:  INR 2-3   Plan: -Warfarin 4 mg PO daily -Daily INR, CBC every 3 days  Jeanella Cara, PharmD, [facility] Clinical Pharmacist Please see AMION for all Pharmacists' Contact Phone Numbers 04/12/2023, 7:29 AM

## 2023-04-12 NOTE — Progress Notes (Signed)
Physical Therapy Session Note  Patient Details  Name: Dale Mills MRN: [id number] Date of Birth: Jun 12, 1966  Today's Date: 04/12/2023 PT Individual Time: 0937-1016 PT Individual Time Calculation (min): 39 min   Short Term Goals: Week 1:  PT Short Term Goal 1 (Week 1): Pt will STS CGA w/ LRAD PT Short Term Goal 2 (Week 1): Pt will ambulate 50 ft modA w/ LRAD PT Short Term Goal 3 (Week 1): Pt will navigate ramp modA w/ LRAD PT Short Term Goal 4 (Week 1): Pt w/ perform bed to chair transfer w/ minA using LRAD PT Short Term Goal 5 (Week 1): Pt will require minA for bed mobility  Skilled Therapeutic Interventions/Progress Updates:      Pt seated in recliner with legs elevated with RN and MD in room. Pt agreeable to therapy. Pt denies any pain, but not feeling well. RN assessed vitals at start of session seated BP 116/77. MD requesting thigh high ted hose.   Seated BP after donning ted hose and seated exercise: 117/80 HR 97, standing BP 87/73, pt asymptomatic. Returned to 120/87 HR 103 with sitting EOB.   Therapist donned thigh high ted hose with total A. Treatment session focused on improving pt general strength, BP and activity tolerance.   Pt performed 3x5 ankle pumps bilaterally, 1x10 active assisted SLR B, 2x5 LAQ B, 1x5 seated marching (active assisted on L LE).   Pt reports hallucinations-seeing bugs since arrival. Nursing aware.   Pt reports need to have a BM, and requesting to walk to bathroom. Pt performed sit to stand with +1 min A with +2 there for safety. Opted to use bed pan for safety. Pt performed stand pivot transfer with RW and +2 with CGA for safety. Sit to supine with supervision, rolling to L with supervision for placement of bed pan.   Pt on bed pan with  nurse in room at end of session and bed alarm on.   Therapy Documentation Precautions:  Precautions Precautions: Sternal, Fall Precaution Comments: Verbal education provided during eval. Pt able to verbalize he  "doesn't need to open up the incision w/ movement" Restrictions Weight Bearing Restrictions: Yes RUE Weight Bearing: Non weight bearing LUE Weight Bearing: Non weight bearing Other Position/Activity Restrictions: sternal precaution  Therapy/Group: Individual Therapy  [facility], [place], DPT  04/12/2023, 10:01 AM

## 2023-04-12 NOTE — IPOC Note (Signed)
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)     Functional Problem List: Nursing Bladder, Bowel, Endurance, Medication Management, Pain, Safety, Skin Integrity  PT Behavior, Motor, Safety, Balance, Edema, Pain, Endurance, Perception  OT Balance, Cognition, Endurance, Safety  SLP Cognition, Safety, Motor  TR         Basic ADL's: OT Grooming, Bathing, Dressing, Toileting     Advanced  ADL's: OT       Transfers: PT Bed to Chair, Car, Bed Mobility  OT Toilet, Tub/Shower     Locomotion: PT Ambulation, Stairs     Additional Impairments: OT Fuctional Use of Upper Extremity  SLP Communication, Social Cognition   Attention, Memory, Problem Solving  TR      Anticipated Outcomes Item Anticipated Outcome  Self Feeding    Swallowing      Basic self-care  SBA  Toileting  SBA   Bathroom Transfers SBA  Bowel/Bladder  paptient will be contrinent of bowel and bladder with min assist  Transfers  supervision  Locomotion  CGA  Communication  sup A  Cognition  sup A  Pain  pain will be less than or equal to 4/10 with min assist  Safety/Judgment  patientr will be free from falls/injury and displaying appropriate safety judgement   Therapy Plan: PT Intensity: Minimum of 1-2 x/day ,45 to 90 minutes PT Frequency: 5 out of 7 days PT Duration Estimated Length of Stay: 10-14 OT Intensity: Minimum of 1-2 x/day, 45 to 90 minutes OT Frequency: 5 out of 7 days OT Duration/Estimated Length of Stay: Pt owns: shower seat with back, RW, standard walker, toilet riser, BSC, grab bars - toilet. SLP Intensity: Minumum of 1-2 x/day, 30 to 90 minutes SLP Frequency: 3 to 5 out of 7 days SLP Duration/Estimated Length of Stay: 10-14  days   Team Interventions: Nursing Interventions Patient/Family Education, Bladder Management, Bowel Management, Disease Management/Prevention, Pain Management, Medication Management, Skin Care/Wound Management, Discharge Planning  PT interventions Ambulation/gait training, Balance/vestibular training, Cognitive remediation/compensation, Community reintegration, Discharge planning, Disease management/prevention, DME/adaptive equipment instruction, Functional mobility training, Neuromuscular re-education, Pain management, Patient/family education, Stair training, Therapeutic Activities, Therapeutic Exercise, UE/LE Strength taining/ROM, UE/LE Coordination activities, Visual/perceptual remediation/compensation, Skin care/wound management  OT Interventions Balance/vestibular training, Neuromuscular re-education, Self Care/advanced ADL retraining, Wheelchair propulsion/positioning, Therapeutic Exercise, UE/LE Strength taining/ROM, DME/adaptive equipment instruction, Cognitive remediation/compensation, Community reintegration, Discharge planning, Functional mobility training, Therapeutic Activities, Visual/perceptual remediation/compensation, UE/LE Coordination activities, Patient/family education  SLP Interventions Cognitive remediation/compensation, Internal/external aids, Speech/Language facilitation, Cueing hierarchy, Oral motor exercises, Therapeutic Activities, Patient/family education, Multimodal communication approach, Functional tasks, Therapeutic Exercise  TR Interventions    SW/CM Interventions Discharge Planning, Patient/Family Education, Disease Management/Prevention, Psychosocial Support   Barriers to Discharge MD  Medical stability  Nursing Decreased caregiver support 1 level ramped entry;ideally will discharge directly to an ALF, however if that is not possible can d/c home with Amber until they are able to place him.  PT Decreased caregiver support, Lack of/limited family support, Weight     OT Other (comments) sternal precautions  SLP Decreased caregiver support    SW Lack of/limited family support, Insurance for SNF coverage, Decreased caregiver support Patient uninsured!   Team Discharge Planning: Destination: PT-Home (possible ALF) ,OT- Assisted Living , SLP-Assisted Living Projected Follow-up: PT-Home health PT, 24  hour supervision/assistance, OT-  Home health OT, SLP-None Projected Equipment Needs: PT-To be determined, OT- To be determined, SLP-None recommended by SLP Equipment Details: PT- , OT-  Patient/family involved in discharge planning: PT- Patient,  OT-Patient, SLP-Patient  MD ELOS: 12-14d Medical Rehab Prognosis:  Fair Assessment: The patient has been admitted for CIR therapies with the diagnosis of debility . The team will be addressing functional mobility, strength, stamina, balance, safety, adaptive techniques and equipment, self-care, bowel and bladder mgt, patient and caregiver education, management of fluids, IV abx. Goals have been set at Sup. Anticipated discharge destination is home.        See Team Conference Notes for weekly updates to the plan of care

## 2023-04-13 ENCOUNTER — Telehealth (HOSPITAL_COMMUNITY): Payer: Self-pay

## 2023-04-13 DIAGNOSIS — I330 Acute and subacute infective endocarditis: Secondary | ICD-10-CM

## 2023-04-13 DIAGNOSIS — B952 Enterococcus as the cause of diseases classified elsewhere: Secondary | ICD-10-CM

## 2023-04-13 DIAGNOSIS — Z952 Presence of prosthetic heart valve: Secondary | ICD-10-CM

## 2023-04-13 DIAGNOSIS — R7881 Bacteremia: Secondary | ICD-10-CM

## 2023-04-13 DIAGNOSIS — I76 Septic arterial embolism: Secondary | ICD-10-CM

## 2023-04-13 LAB — GLUCOSE, CAPILLARY
Glucose-Capillary: 96 mg/dL (ref 70–99)
Glucose-Capillary: 101 mg/dL — ABNORMAL HIGH (ref 70–99)
Glucose-Capillary: 126 mg/dL — ABNORMAL HIGH (ref 70–99)
Glucose-Capillary: 127 mg/dL — ABNORMAL HIGH (ref 70–99)

## 2023-04-13 LAB — BASIC METABOLIC PANEL
Anion gap: 8 (ref 5–15)
BUN: 11 mg/dL (ref 6–20)
CO2: 25 mmol/L (ref 22–32)
Calcium: 8.7 mg/dL — ABNORMAL LOW (ref 8.9–10.3)
Chloride: 104 mmol/L (ref 98–111)
Creatinine, Ser: 0.64 mg/dL (ref 0.61–1.24)
GFR, Estimated: >60 mL/min (ref >60)
Glucose, Bld: 100 mg/dL — ABNORMAL HIGH (ref 70–99)
Potassium: 3.4 mmol/L — ABNORMAL LOW (ref 3.5–5.1)
Sodium: 137 mmol/L (ref 135–145)

## 2023-04-13 LAB — PROTIME-INR
INR: 2.8 — ABNORMAL HIGH (ref 0.8–1.2)
Prothrombin Time: 29.9 s — ABNORMAL HIGH (ref 11.4–15.2)

## 2023-04-13 MED ORDER — POTASSIUM CHLORIDE CRYS ER 20 MEQ PO TBCR: 20.0000 meq | EXTENDED_RELEASE_TABLET | Freq: Two times a day (BID) | ORAL | Status: DC

## 2023-04-13 MED ORDER — FUROSEMIDE 10 MG/ML IJ SOLN
40.0000 mg | Freq: Every day | INTRAMUSCULAR | Status: AC
  Administered 2023-04-13 – 2023-04-17 (×5): 40 mg via INTRAVENOUS
  Filled 2023-04-13 (×5): qty 4

## 2023-04-13 MED ORDER — POTASSIUM CHLORIDE CRYS ER 20 MEQ PO TBCR
20.0000 meq | EXTENDED_RELEASE_TABLET | Freq: Every day | ORAL | Status: DC
Start: 2023-04-16 — End: 2023-04-17
  Administered 2023-04-16 – 2023-04-17 (×2): 20 meq via ORAL
  Filled 2023-04-13 (×2): qty 1

## 2023-04-13 MED ORDER — FUROSEMIDE 10 MG/ML IJ SOLN
40.0000 mg | Freq: Once | INTRAMUSCULAR | Status: AC
  Administered 2023-04-13: 40 mg via INTRAVENOUS
  Filled 2023-04-13: qty 4

## 2023-04-13 MED ORDER — POTASSIUM CHLORIDE CRYS ER 20 MEQ PO TBCR
30.0000 meq | EXTENDED_RELEASE_TABLET | Freq: Two times a day (BID) | ORAL | Status: AC
  Administered 2023-04-13 – 2023-04-15 (×6): 30 meq via ORAL
  Filled 2023-04-13 (×6): qty 1

## 2023-04-13 NOTE — Progress Notes (Signed)
ANTICOAGULATION CONSULT NOTE- follow-up  Pharmacy Consult for warfarin Indication: atrial fibrillation/ LUE DVT  No Known Allergies  Patient Measurements: Height: 6' (182.9 cm) Weight: 115.4 kg (254 lb 6.6 oz) IBW/kg (Calculated) : 77.6 Heparin Dosing Weight: 100 kg  Vital Signs: Temp: 98.1 F (36.7 C) (10/03 0458) BP: 120/86 (10/03 0458) Pulse Rate: 85 (10/03 0458)  Labs: Recent Labs    04/10/23 1100 04/11/23 0337 04/12/23 0400 04/13/23 0414  HGB  --  7.5*  --   --   HCT  --  24.7*  --   --   PLT  --  336  --   --   LABPROT 24.7* 25.0* 26.2* 29.9*  INR 2.2* 2.2* 2.4* 2.8*  CREATININE 0.88  --   --  0.64    Estimated Creatinine Clearance: 133.6 mL/min (by C-G formula based on SCr of 0.64 mg/dL).   Medical History: Past Medical History:  Diagnosis Date   Angina pectoris (HCC) Canadian classification 2 04/10/2020   Anxiety    Arthritis    Coronary artery disease coronary CT angio did not show any major issues, suspicion for distal disease 06/12/2020   Depression    DJD (degenerative joint disease)    Dyslipidemia 02/13/2020   Essential hypertension 02/13/2020   Precordial chest pain 02/13/2020    Medications:  Scheduled:   (feeding supplement) PROSource Plus  30 mL Oral BID BM   amiodarone  400 mg Oral BID   ascorbic acid  500 mg Oral Daily   aspirin EC  81 mg Oral Daily   atorvastatin  80 mg Oral Daily   Chlorhexidine Gluconate Cloth  6 each Topical Q12H   ezetimibe  10 mg Oral Daily   Fe Fum-Vit C-Vit B12-FA  1 capsule Oral QPC breakfast   furosemide  40 mg Oral Daily   insulin aspart  2-6 Units Subcutaneous TID AC & HS   insulin detemir  6 Units Subcutaneous Daily   metoprolol tartrate  12.5 mg Oral BID   nutrition supplement (JUVEN)  1 packet Oral BID BM   mouth rinse  15 mL Mouth Rinse 4 times per day   pantoprazole  40 mg Oral Daily   Ensure Max Protein  11 oz Oral BID   sodium chloride flush  10-40 mL Intracatheter Q12H   tamsulosin  0.4 mg  Oral QPC supper   traZODone  50 mg Oral QHS   warfarin  4 mg Oral q1600   Warfarin - Pharmacist Dosing Inpatient   Does not apply q1600   Infusions:   ampicillin (OMNIPEN) IV 2 g (04/13/23 0622)   cefTRIAXone (ROCEPHIN)  IV 2 g (04/12/23 2124)    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 Having recurrent Afib postop, pharmacy now consulted to start warfarin for Afib. No new DDIs identified, patient remains on amiodarone and ampicillin which may increase serum warfarin.  Acute DVT noted in the left internal jugular and a superficial thrombosis of left upper arm.  Pt has been refusing SCD's.  Given subtherapeutic INR , heparin drip started on 9/26. 9/27 changed heparin infusion to enoxaparin per pharmacy consult in conversation with CT surgery PA.    -last dose of lovenox given 9/29 06:23  04/13/2023 AM update INR  2.8   Goal of Therapy:  INR 2-3   Plan: -Warfarin 4 mg PO daily -Daily INR,  CBC every 3 days  Crystall Donaldson A. Jeanella Craze, PharmD, BCPS, FNKF Clinical Pharmacist [place] Please utilize Amion for appropriate phone number to reach the unit pharmacist [facility])  04/13/2023, 7:22 AM

## 2023-04-13 NOTE — Consult Note (Signed)
Cardiology Consultation   Patient ID: Dale Mills MRN: [id number]; DOB: May 17, 1966  Admit date: 04/09/2023 Date of Consult: 04/13/2023  PCP:  Nonnie Done., MD   [facility] Providers Cardiologist:  Gypsy Balsam, MD        Patient Profile:   Dale Mills is a 57 y.o. 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 Wynn Banker.  History of Present Illness:   Dale Mills has a recently highly complicated medical course, reviewed extensively today. 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 He also had CVA consistent with septic embolism. Given this, he underwent AVR, MVR, and aortic root replacement with Dr. Leafy Ro on 03/31/23. Course complicated by post op afib and L internal jugular DVT. He was transferred to inpatient rehab on 04/09/23.  Cardiology called today for shortness of breath. Noted that he developed cough last night. He was also noted to have LE edema. He was given one dose of IV lasix and cardiology was consulted.  Daily weights reviewed, these have been stable at ~115 kg. Chest xray read today as left basilar airspace opacity. On my review, appears to have some mild vascular congestion but no clear effusion bilaterally.  He is mildly short of breath conversationally, notes this is worse with activity. Also notes LE edema, has thigh high compression stockings on currently. No chest pain or palpitations.  Past Medical History:  Diagnosis Date   Angina pectoris (HCC) Canadian classification 2 04/10/2020   Anxiety    Arthritis    Coronary artery disease coronary CT angio did not show any major issues, suspicion for distal disease 06/12/2020   Depression     DJD (degenerative joint disease)    Dyslipidemia 02/13/2020   Essential hypertension 02/13/2020   Precordial chest pain 02/13/2020    Past Surgical History:  Procedure Laterality Date   ASCENDING AORTIC ROOT REPLACEMENT N/A 03/31/2023   Procedure: ASCENDING AORTIC ROOT REPLACEMENT UTILIZING 27 KONECT RESILIA  AORTIC VALVE CONDUIT;  Surgeon: Eugenio Hoes, MD;  Location: [facility] OR;  Service: Open Heart Surgery;  Laterality: N/A;   COLONOSCOPY W/ POLYPECTOMY     FOREARM SURGERY     MASS REMOVED   HIP ARTHROSCOPY Left    LASIK Bilateral    MITRAL VALVE REPLACEMENT N/A 03/31/2023   Procedure: MITRAL VALVE (MV) REPLACEMENT UTILIZING SIZE 33 MOSAIC PORCINE HEART VALVE;  Surgeon: Eugenio Hoes, MD;  Location: [facility] OR;  Service: Open Heart Surgery;  Laterality: N/A;   TEE WITHOUT CARDIOVERSION N/A 03/31/2023   Procedure: TRANSESOPHAGEAL ECHOCARDIOGRAM;  Surgeon: Eugenio Hoes, MD;  Location: [facility] OR;  Service: Open Heart Surgery;  Laterality: N/A;   TONSILECTOMY, ADENOIDECTOMY, BILATERAL MYRINGOTOMY AND TUBES  2011   UMBILICAL HERNIA REPAIR     VASECTOMY       Home Medications:  Prior to Admission medications   Medication Sig Start Date End Date Taking? Authorizing Provider  amiodarone (PACERONE) 400 MG tablet Take 1 tablet (400 mg total) by mouth 2 (two) times daily. Through 04/14/2023 then decrease the dose to 200mg  by mouth twice daily. 04/07/23   Leary Roca, PA-C  ampicillin IVPB Inject 12 g into the vein daily. As a continuous infusion Indication:  Enterococcus  faecalis endocarditis  First Dose: Yes Last Day of Therapy: 05/12/2023 Labs - Once weekly:  CBC/D and BMP, Labs - Once weekly: ESR and CRP Method of administration: Ambulatory Pump (Continuous Infusion) Method of administration may be changed at the discretion of home infusion pharmacist based upon assessment of the patient and/or caregiver's ability to self-administer the medication ordered. 04/07/23   Leary Roca, PA-C   aspirin EC 81 MG tablet Take 1 tablet (81 mg total) by mouth daily. Swallow whole. 04/08/23   Leary Roca, PA-C  atorvastatin (LIPITOR) 80 MG tablet Take 1 tablet (80 mg total) by mouth daily. 05/02/22   Georgeanna Lea, MD  cefTRIAXone (ROCEPHIN) IVPB Inject 2 g into the vein every 12 (twelve) hours. Indication:   Enterococcus faecalis endocarditis  First Dose: Yes Last Day of Therapy:  05/12/2023 Labs - Once weekly:  CBC/D and BMP, Labs - Once weekly: ESR and CRP Method of administration: IV Push Method of administration may be changed at the discretion of home infusion pharmacist based upon assessment of the patient and/or caregiver's ability to self-administer the medication ordered. 04/07/23   Leary Roca, PA-C  enoxaparin (LOVENOX) 120 MG/0.8ML injection Inject 0.74 mLs (110 mg total) into the skin every 12 (twelve) hours. 04/08/23   Leary Roca, PA-C  ezetimibe (ZETIA) 10 MG tablet Take 1 tablet (10 mg total) by mouth daily. 04/08/23   Leary Roca, PA-C  Fe Fum-Vit C-Vit B12-FA (TRIGELS-F FORTE) CAPS capsule Take 1 capsule by mouth daily after breakfast. 04/08/23   Roddenberry, Cecille Amsterdam, PA-C  feeding supplement (ENSURE ENLIVE / ENSURE PLUS) LIQD Take 237 mLs by mouth 3 (three) times daily between meals. 04/07/23   Leary Roca, PA-C  furosemide (LASIX) 40 MG tablet Take 1 tablet (40 mg total) by mouth daily. 04/08/23   Leary Roca, PA-C  metoprolol tartrate (LOPRESSOR) 25 MG tablet Take 0.5 tablets (12.5 mg total) by mouth 2 (two) times daily. 04/07/23   Leary Roca, PA-C  Mouthwashes (MOUTH RINSE) LIQD solution 15 mLs by Mouth Rinse route 2 (two) times daily. 04/07/23   Leary Roca, PA-C  nitroGLYCERIN (NITROSTAT) 0.4 MG SL tablet Place 1 tablet (0.4 mg total) under the tongue every 5 (five) minutes as needed for chest pain. 02/13/20 03/28/23  Georgeanna Lea, MD  pantoprazole (PROTONIX) 40 MG tablet Take 40 mg by mouth  daily. 11/02/22   [provider]  pantoprazole (PROTONIX) 40 MG tablet Take 1 tablet (40 mg total) by mouth daily. 04/08/23   Gold, Deniece Portela E, PA-C  sodium chloride flush (NS) 0.9 % SOLN 10-40 mLs by Intracatheter route every 12 (twelve) hours. 04/07/23   Leary Roca, PA-C  sodium chloride flush (NS) 0.9 % SOLN 10-40 mLs by Intracatheter route as needed (flush). 04/07/23   Leary Roca, PA-C  tamsulosin (FLOMAX) 0.4 MG CAPS capsule Take 0.4 mg by mouth 2 (two) times daily. 02/16/23   [provider]  traMADol (ULTRAM) 50 MG tablet Take 1-2 tablets (50-100 mg total) by mouth every 6 (six) hours as needed for moderate pain. 04/07/23   Leary Roca, PA-C  warfarin (COUMADIN) 5 MG tablet Take 1 tablet (5 mg total) by mouth one time only at 4 PM. Then as directed per pharmacy dosing 04/09/23   Rowe Clack, PA-C    Inpatient Medications: Scheduled Meds:  (feeding supplement) PROSource Plus  30 mL Oral BID BM   amiodarone  400 mg Oral BID  ascorbic acid  500 mg Oral Daily   aspirin EC  81 mg Oral Daily   atorvastatin  80 mg Oral Daily   Chlorhexidine Gluconate Cloth  6 each Topical Q12H   ezetimibe  10 mg Oral Daily   Fe Fum-Vit C-Vit B12-FA  1 capsule Oral QPC breakfast   furosemide  40 mg Intravenous Daily   insulin aspart  2-6 Units Subcutaneous TID AC & HS   insulin detemir  6 Units Subcutaneous Daily   metoprolol tartrate  12.5 mg Oral BID   nutrition supplement (JUVEN)  1 packet Oral BID BM   mouth rinse  15 mL Mouth Rinse 4 times per day   pantoprazole  40 mg Oral Daily   potassium chloride  30 mEq Oral BID   [START ON 04/16/2023] potassium chloride  20 mEq Oral Daily   Ensure Max Protein  11 oz Oral BID   sodium chloride flush  10-40 mL Intracatheter Q12H   tamsulosin  0.4 mg Oral QPC supper   traZODone  50 mg Oral QHS   warfarin  4 mg Oral q1600   Warfarin - Pharmacist Dosing Inpatient   Does not apply q1600   Continuous Infusions:  ampicillin  (OMNIPEN) IV 2 g (04/13/23 1307)   cefTRIAXone (ROCEPHIN)  IV 2 g (04/13/23 1032)   PRN Meds: acetaminophen, alum & mag hydroxide-simeth, bisacodyl, diphenhydrAMINE, guaiFENesin-dextromethorphan, melatonin, oxyCODONE, prochlorperazine **OR** prochlorperazine **OR** prochlorperazine, sodium chloride flush, sodium phosphate, traMADol  Allergies:   No Known Allergies  Social History:   Social History   Socioeconomic History   Marital status: Divorced    Spouse name: Not on file   Number of children: 2   Years of education: 12 + SOME COLLEGE   Highest education level: Not on file  Occupational History   Occupation: [employer]  Tobacco Use   Smoking status: Never   Smokeless tobacco: Never  Vaping Use   Vaping status: Never Used  Substance and Sexual Activity   Alcohol use: Never   Drug use: Never   Sexual activity: Not Currently  Other Topics Concern   Not on file  Social History Narrative   Not on file   Social Determinants of Health   Financial Resource Strain: Not on file  Food Insecurity: Patient Unable To Answer (03/28/2023)   Hunger Vital Sign    Worried About Running Out of Food in the Last Year: Patient unable to answer    Ran Out of Food in the Last Year: Patient unable to answer  Recent Concern: Food Insecurity - Food Insecurity Present (03/17/2023)   Hunger Vital Sign    Worried About Programme researcher, broadcasting/film/video in the Last Year: Sometimes true    Ran Out of Food in the Last Year: Sometimes true  Transportation Needs: Patient Unable To Answer (03/28/2023)   PRAPARE - Transportation    Lack of Transportation (Medical): Patient unable to answer    Lack of Transportation (Non-Medical): Patient unable to answer  Physical Activity: Not on file  Stress: Not on file  Social Connections: Not on file  Intimate Partner Violence: Patient Unable To Answer (03/28/2023)   Humiliation, Afraid, Rape, and Kick questionnaire    Fear of Current or Ex-Partner: Patient unable to answer     Emotionally Abused: Patient unable to answer    Physically Abused: Patient unable to answer    Sexually Abused: Patient unable to answer    Family History:    Family History  Problem Relation Age of Onset  CAD Mother    Kidney failure Mother    Heart attack Father    Heart attack Paternal Grandfather    Cancer Child        RARE BONE CANCER RESULTING IN TOTAL AMPUTATION OF LIMB     ROS:  Please see the history of present illness.   All other ROS reviewed and negative.     Physical Exam/Data:   Vitals:   04/12/23 1339 04/12/23 2015 04/13/23 0458 04/13/23 1357  BP: 109/73 129/81 120/86 105/80  Pulse: 90 92 85 88  Resp: 18 18 19  (!) 22  Temp: 98.3 F (36.8 C) 98 F (36.7 C) 98.1 F (36.7 C) 97.8 F (36.6 C)  TempSrc:      SpO2: 94% 98% 97% 99%  Weight:   115.7 kg   Height:        Intake/Output Summary (Last 24 hours) at 04/13/2023 1713 Last data filed at 04/13/2023 1307 Gross per 24 hour  Intake 850 ml  Output 3400 ml  Net -2550 ml      04/13/2023    4:58 AM 04/12/2023    5:00 AM 04/11/2023    3:28 AM  Last 3 Weights  Weight (lbs) 255 lb 1.2 oz 254 lb 6.6 oz 252 lb 10.4 oz  Weight (kg) 115.7 kg 115.4 kg 114.6 kg     Body mass index is 34.59 kg/m.  General:  Well nourished, well developed, in no acute distress HEENT: normal Neck: JVD not well visualized Vascular: No carotid bruits; Distal pulses 2+ bilaterally Cardiac:  normal S1, S2; RRR; 1/6 systolic aortic murmur and 1/6 systolic mitral murmur Lungs:  Crackles at bilateral lower lung fields Abd: soft, nontender, no hepatomegaly  Ext: 1+ bilateral ankle edema Musculoskeletal:  No deformities Skin: warm and dry  Psych:  Normal affect   EKG:  The EKG was personally reviewed and demonstrates:  no new Telemetry:  Telemetry was personally reviewed and demonstrates:  not on telemetry  Relevant CV Studies: No new  Laboratory Data:  High Sensitivity Troponin:  No results for input(s): "TROPONINIHS" in the  last 720 hours.   Chemistry Recent Labs  Lab 04/09/23 0440 04/10/23 1100 04/13/23 0414  NA 135 132* 137  K 3.8 3.5 3.4*  CL 102 102 104  CO2 24 22 25   GLUCOSE 112* 129* 100*  BUN 9 8 11   CREATININE 0.72 0.88 0.64  CALCIUM 8.4* 8.6* 8.7*  GFRNONAA >60 >60 >60  ANIONGAP 9 8 8     No results for input(s): "PROT", "ALBUMIN", "AST", "ALT", "ALKPHOS", "BILITOT" in the last 168 hours. Lipids No results for input(s): "CHOL", "TRIG", "HDL", "LABVLDL", "LDLCALC", "CHOLHDL" in the last 168 hours.  Hematology Recent Labs  Lab 04/08/23 0454 04/09/23 0440 04/11/23 0337  WBC 10.9* 9.0 7.2  RBC 2.67* 2.78* 2.78*  HGB 7.6* 7.9* 7.5*  HCT 24.9* 25.7* 24.7*  MCV 93.3 92.4 88.8  MCH 28.5 28.4 27.0  MCHC 30.5 30.7 30.4  RDW 19.1* 18.8* 17.5*  PLT 255 304 336   Thyroid No results for input(s): "TSH", "FREET4" in the last 168 hours.  BNPNo results for input(s): "BNP", "PROBNP" in the last 168 hours.  DDimer No results for input(s): "DDIMER" in the last 168 hours.   Radiology/Studies:  DG Chest 2 View  Result Date: 04/12/2023 CLINICAL DATA:  Shortness of breath EXAM: CHEST - 2 VIEW COMPARISON:  04/08/23 FINDINGS: Cardiac shadow is enlarged. Postsurgical changes are again seen. Right PICC is noted in satisfactory position. Lungs are well  aerated bilaterally. Increased left basilar airspace opacity is noted. No sizable effusion is noted. No bony abnormality is seen. IMPRESSION: Left basilar airspace opacity. Electronically Signed   By: Alcide Clever M.D.   On: 04/12/2023 19:15     Assessment and Plan:   Shortness of breath -mild volume overload on exam and my interpretation of CXR, though weights stable -has received IV lasix x1. 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  with 33 mm porcine valve, aortic root replacement with 27 mm pericardial valve conduit -continue aspirin for prosthetic valves -finishing IV antibiotic course per ID timeline  Perioperative atrial fibrillation -CHA2DS2/VAS Stroke Risk Points = 3 (HTN, CVA) -can decrease amiodarone dose to 200 mg daily  Nonobstructive CAD -continue statin  Risk Assessment/Risk Scores:        New York Heart Association (NYHA) Functional Class NYHA Class II         For questions or updates, please contact [facility] Please consult [email or web address] for contact info under    Signed, Jodelle Red, MD  04/13/2023 5:13 PM

## 2023-04-13 NOTE — Progress Notes (Addendum)
PROGRESS NOTE   Subjective/Complaints: Feels better urinated a lot last noc, reviewed labs , no fevers   Review of Systems  Constitutional:  Negative for chills, fever and malaise/fatigue.  Eyes:  Positive for blurred vision.  Respiratory:  Positive for shortness of breath.   Cardiovascular:  Positive for chest pain.  Gastrointestinal:  Positive for diarrhea.  Musculoskeletal:  Positive for joint pain.  Skin:  Negative for rash.  Neurological:  Positive for weakness.  Psychiatric/Behavioral:  The patient has insomnia.     Objective:   DG Chest 2 View  Result Date: 04/12/2023 CLINICAL DATA:  Shortness of breath EXAM: CHEST - 2 VIEW COMPARISON:  04/08/23 FINDINGS: Cardiac shadow is enlarged. Postsurgical changes are again seen. Right PICC is noted in satisfactory position. Lungs are well aerated bilaterally. Increased left basilar airspace opacity is noted. No sizable effusion is noted. No bony abnormality is seen. IMPRESSION: Left basilar airspace opacity. Electronically Signed   By: Alcide Clever M.D.   On: 04/12/2023 19:15   Recent Labs    04/11/23 0337  WBC 7.2  HGB 7.5*  HCT 24.7*  PLT 336   Recent Labs    04/10/23 1100 04/13/23 0414  NA 132* 137  K 3.5 3.4*  CL 102 104  CO2 22 25  GLUCOSE 129* 100*  BUN 8 11  CREATININE 0.88 0.64  CALCIUM 8.6* 8.7*    Intake/Output Summary (Last 24 hours) at 04/13/2023 0824 Last data filed at 04/13/2023 0500 Gross per 24 hour  Intake 730 ml  Output 3150 ml  Net -2420 ml        Physical Exam: Vital Signs Blood pressure 120/86, pulse 85, temperature 98.1 F (36.7 C), resp. rate 19, height 6' (1.829 m), weight 115.7 kg, SpO2 97%.  General: No acute distress Mood and affect are appropriate Heart: Regular rate and rhythm no rubs murmurs or extra sounds Lungs: Clear to auscultation, breathing unlabored, no rales or wheezes Abdomen: Positive bowel sounds, soft nontender  to palpation, nondistended Extremities: No clubbing, cyanosis, 1+ edema left pretibial trace right pretibial edema Skin: No evidence of breakdown, no evidence of rash Neurologic: Cranial nerves II through XII intact, motor strength is 5/5 in bilateral deltoid, bicep, tricep, grip, 4/5 hip flexor, knee extensors, ankle dorsiflexor and plantar flexor No changes Left visual field cut with neglect partially compensated Musculoskeletal: Full range of motion in all 4 extremities. No joint swelling    Assessment/Plan: 1. Functional deficits which require 3+ hours per day of interdisciplinary therapy in a comprehensive inpatient rehab setting. Physiatrist is providing close team supervision and 24 hour management of active medical problems listed below. Physiatrist and rehab team continue to assess barriers to discharge/monitor patient progress toward functional and medical goals  Care Tool:  Bathing    Body parts bathed by patient: Left arm, Chest, Right arm, Abdomen, Front perineal area, Buttocks, Face   Body parts bathed by helper: Right lower leg, Left lower leg, Left upper leg, Right upper leg     Bathing assist Assist Level: Moderate Assistance - Patient 50 - 74%     Upper Body Dressing/Undressing Upper body dressing   What is the patient wearing?: Hospital  gown only    Upper body assist Assist Level: Moderate Assistance - Patient 50 - 74%    Lower Body Dressing/Undressing Lower body dressing      What is the patient wearing?: Pants, Underwear/pull up     Lower body assist Assist for lower body dressing: Maximal Assistance - Patient 25 - 49%     Toileting Toileting    Toileting assist Assist for toileting: Maximal Assistance - Patient 25 - 49%     Transfers Chair/bed transfer  Transfers assist     Chair/bed transfer assist level: Moderate Assistance - Patient 50 - 74%     Locomotion Ambulation   Ambulation assist      Assist level: Moderate Assistance -  Patient 50 - 74% Assistive device: Walker-rolling Max distance: 21 ft   Walk 10 feet activity   Assist     Assist level: Moderate Assistance - Patient - 50 - 74% Assistive device: Walker-rolling   Walk 50 feet activity   Assist Walk 50 feet with 2 turns activity did not occur: Safety/medical concerns         Walk 150 feet activity   Assist Walk 150 feet activity did not occur: Safety/medical concerns         Walk 10 feet on uneven surface  activity   Assist Walk 10 feet on uneven surfaces activity did not occur: Safety/medical concerns         Wheelchair     Assist Is the patient using a wheelchair?: Yes Type of Wheelchair: Manual    Wheelchair assist level: Dependent - Patient 0%      Wheelchair 50 feet with 2 turns activity    Assist        Assist Level: Dependent - Patient 0%   Wheelchair 150 feet activity     Assist      Assist Level: Dependent - Patient 0%   Blood pressure 120/86, pulse 85, temperature 98.1 F (36.7 C), resp. rate 19, height 6' (1.829 m), weight 115.7 kg, SpO2 97%.   Medical Problem List and Plan: 1. 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             -patient may  shower             -ELOS/Goals: 10/15, PT/OT/SLP Sup  -Continue CIR 2.  L-internal jugular DVT/ A fib/Antithrombotics: -DVT/anticoagulation:  Pharmaceutical:LUE cephalic and internal jugular DVT Coumadin and Heparin bridge to change to lovenox at discharge             -antiplatelet therapy: ASA 3. Pain Management:  oxycodone or tramadol prn for pain.  4. Mood/Behavior/Sleep: LCSW to follow for evaluation and support.               -antipsychotic agents: N/A             -- Order melatonin prn for insomnia.  Schedule trazodone nightly 5. Neuropsych/cognition: This patient is capable of making decisions on his own behalf. 6. Skin/Wound Care: Routine pressure relief measures.  7. Fluids/Electrolytes/Nutrition: Strict I/O. Check CMET in am.  8. 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 Fluid overload/SOB: Lasix 40 mg decreased to daily on 09/25 --developed cough last nigh with SOB 09/27 pm.  --daily weights with monitoring for signs of overload. .  -wt stable, monitor Edema in legs will give IV lasix x 1 today.  CXR unchanged, cards to f/u Wts are stable   Filed Weights   04/11/23 0328 04/12/23 0500 04/13/23 0458  Weight: 114.6 kg 115.4 kg 115.7 kg  Repeat IV lasix this am 10/3  10. A fib: Continue on amiodarone 400 mg bid since 09/25-->taper? May need to contact cards for this  11. Pre diabetes/Stress induced hyperglycemia: Monitor BS ac/hs.  --Hgb A1C 6.2 (prediabetic). --diet changed to CM from regular. Continue Levemir 8 units daily with SSI prn for tighter BS control. .  --Transition insulin to metformin as intake improves.   -9/30 decrease levemir to 6 u CBG (last 3)  Recent Labs    04/12/23 1706 04/12/23 2056 04/13/23 0659  GLUCAP 112* 134* 96    12 ABLA:   Monitor for signs of bleeding             --transfuse prn <7.0 or if symptomatic. Unclear whether fatigue is related to anemia.  Walked further last week with similar Hgb  -9/30 hemoglobin stable at 7.9 yesterday CBC    Component Value Date/Time   WBC 7.2 04/11/2023 0337   RBC 2.78 (L) 04/11/2023 0337   HGB 7.5 (L) 04/11/2023 0337   HCT 24.7 (L) 04/11/2023 0337   PLT 336 04/11/2023 0337   MCV 88.8 04/11/2023 0337   MCH 27.0 04/11/2023 0337   MCHC 30.4 04/11/2023 0337   RDW 17.5 (H) 04/11/2023 0337       13. Constipation: Resolved and now w/diarrhea. Will hold laxative.    -9/30 frequent BM not liquid, continue to hold laxatives  14. Hyponatremia,mild  -Recheck Thursday  15.  Hypo K+ mild add KCL daily  LOS: 4 days A FACE TO FACE EVALUATION WAS PERFORMED  Erick Colace 04/13/2023, 8:24 AM

## 2023-04-13 NOTE — Progress Notes (Signed)
Physical Therapy Session Note  Patient Details  Name: Dale Mills MRN: [id number] Date of Birth: 1966/04/04  Today's Date: 04/13/2023 PT Individual Time: 0920-1000 PT Individual Time Calculation (min): 40 min   Short Term Goals: Week 1:  PT Short Term Goal 1 (Week 1): Pt will STS CGA w/ LRAD PT Short Term Goal 2 (Week 1): Pt will ambulate 50 ft modA w/ LRAD PT Short Term Goal 3 (Week 1): Pt will navigate ramp modA w/ LRAD PT Short Term Goal 4 (Week 1): Pt w/ perform bed to chair transfer w/ minA using LRAD PT Short Term Goal 5 (Week 1): Pt will require minA for bed mobility  Skilled Therapeutic Interventions/Progress Updates: Patient supine in bed on entrance to room. Patient alert and agreeable to PT session.   Patient reported needing to use the restroom to void bladder at beginning of session. Pt supine to EOB with CGA, and VC to maintain precautions during sequence. Pt scooted to EOB with increased time required. Pt then stood to RW, and required mod cues to stand without placing B UE's onto RW. Pt with min/light modA to stand to RW. PTA managed IV pole while pt ambulated short distances to toilet with light modA for safety. Pt minA to sit to toilet. Pt reported voiding bladder and attempted to have BM but was unable to. Pt then stood from toilet to RW with modA while maintaining sternal precautions. Pt then with totalA to donn brief. Pt ambulated towards doorway of bathroom and demonstrated poor safety awareness by attempting to exit sideways with RW on far left side. Pt required cues to side step to the R to safely navigate doorway. Pt ambulated back to EOB and sat with minA for safety to control descent. Pt then sit to supine with supervision. Pt participated in supine therex in bed due to reports of feeling like heart was working harder (vitals checked). Pt performed SLR bilaterally 1 x 10, 1 x 7. Pt presented with decreased AROM bilaterally, and required VC for proper timing of breath  during concentric and eccentric motions (also cued to avoid valsalva maneuver).  Patient supine in bed at end of session with brakes locked, bed alarm set, and all needs within reach.      Therapy Documentation Precautions:  Precautions Precautions: Sternal, Fall Precaution Comments: Verbal education provided during eval. Pt able to verbalize he "doesn't need to open up the incision w/ movement" Restrictions Weight Bearing Restrictions: Yes RUE Weight Bearing: Non weight bearing LUE Weight Bearing: Non weight bearing Other Position/Activity Restrictions: sternal precaution  Vitals: HR - 89 SpO2 - 98%  Therapy/Group: Individual Therapy  Bert Givans PTA 04/13/2023, 12:19 PM

## 2023-04-13 NOTE — Progress Notes (Signed)
Speech Language Pathology Daily Session Note  Patient Details  Name: Dale Mills MRN: [id number] Date of Birth: 1965/08/07  Today's Date: 04/13/2023 SLP Individual Time: 1100-1200 SLP Individual Time Calculation (min): 60 min  Short Term Goals: Week 1: SLP Short Term Goal 1 (Week 1): Patient will attend to left visual field during functional tasks with Min verbal cues SLP Short Term Goal 2 (Week 1): Patient will utilize speech intelligibility strategies at the sentence level to achieve 100% intelligibility with Min verbal cues. SLP Short Term Goal 3 (Week 1): Patient will demonstrate selective attention to tasks in a mildly distracting enviornment for 15 minutes with Min verbal cues for redirection. SLP Short Term Goal 4 (Week 1): Patient will recall novel information with 80% acc given min A SLP Short Term Goal 5 (Week 1): Patient will demonstrate ability to problem solve in mildly complex functional situations with min multimodal A  Skilled Therapeutic Interventions: Skilled therapy session focused on cognitive and speech intelligibility goals. SLP faciliated session by encouraging patient to recall date and sternal precautions. Patient able to recall date independently and sternal precautions with supervision A. SLP continued to address cognition and L neglect through counting money task. SLP verbalized amount of change and patient able to present with modA, often confusing dimes and nickles. Patient required minA to utilize L to R scanning strategies during activity. SLP addressed speech intelligibility through review of strategies. Patient was 80% intelligibile at the conversational level with utilization of strategies. Patient continent of bladder during session. Left in bed with alarm set and call bell in reach. Continue POC.   Pain No pain reported   Therapy/Group: Individual Therapy  Mistie Adney M.A., CF-SLP 04/13/2023, 11:57 AM

## 2023-04-13 NOTE — Progress Notes (Signed)
Occupational Therapy Session Note  Patient Details  Name: Dale Mills MRN: [id number] Date of Birth: [birth year]  Today's Date: 04/13/2023 OT Individual Time: 1300-1358 OT Individual Time Calculation (min): 58 min    Short Term Goals: Week 1:  OT Short Term Goal 1 (Week 1): Pt will be able to recall sternal precautions during self care tasks 3/5 sessions with minimal cueing. OT Short Term Goal 2 (Week 1): Pt will complete LB dressing with Mod A utilizing AE as needed. OT Short Term Goal 3 (Week 1): Pt will completed LB bathing with Min A utilizing AE as needed.  Skilled Therapeutic Interventions/Progress Updates:     Pt received recline din bed presenting to be in good spirits receptive to skilled OT session reporting 0/10 pain- OT offering intermittent rest breaks, repositioning, and therapeutic support to optimize participation in therapy session. RN in.out to provide medications and assist with IV line management for UB dressing. Pt requesting to complete sponge bath this session. Pt transitioned to EOB with HOB elevated with min A +increased time and mod verbal cues to maintain sternal precautions. Pt able to verbalize "I need to stay in a tube" to adhere to sternal precautions. Pt completed U/LB bathing seated EOB for dynamic sitting balance challenge and to work on activity tolerance for BADLs. Pt required frequent rest breaks during task and mod verbal cues for attention and sequencing through bathing each body part. Positioned all toiletry items on Pt's L side to increased visual attention to L and facilitate visual scanning with min-mod verbal cues required to attend to L visual field this session. UB bathing complete with min A and LB bathing min A to wash bottom in standing. Donned thigh high TEDs total A. Pt required min A to don shirt d/t IV line management. Pt initiated reaching towards ground to weave B LEs into pants, however assistance required d/t body habitus and Pt feeling SOB  when reaching to the ground. Pt completed sit>stand using RW and brought pants to waist with CGA for balance. Pt completed short distance functional mobility to wc using RW with light min A for balance and fatigue reported following. Pt was left resting in wc with call bell in reach, set belt alarm on, and all needs met.    Therapy Documentation Precautions:  Precautions Precautions: Sternal, Fall Precaution Comments: Verbal education provided during eval. Pt able to verbalize he "doesn't need to open up the incision w/ movement" Restrictions Weight Bearing Restrictions: No RUE Weight Bearing: Weight bearing as tolerated LUE Weight Bearing: Weight bearing as tolerated Other Position/Activity Restrictions: sternal precaution  Therapy/Group: Individual Therapy  Clide Deutscher 04/13/2023, 4:14 PM

## 2023-04-13 NOTE — Progress Notes (Signed)
Patient ID: Dale Mills, male   DOB: 1966-03-03, 57 y.o.   MRN: [id number]  Sw spoke with daughter, Hospital doctor. Daughter has expressed they have begun the Medicaid process and submitted authorized representative form as of yesterday. Reports case worker os currently working Medicaid case and assisting with disability.  Sw discussed the potential of patient being ready before Medicaid in place. SW discussed the potential of requesting an LOG from hospital but cannot guarantee approval of ST placement.   Sw and daughter will continue to discuss as patient progresses over the next week. No additional questions or concerns currently.

## 2023-04-13 NOTE — Progress Notes (Signed)
Physical Therapy Session Note  Patient Details  Name: Dale Mills MRN: [id number] Date of Birth: September 25, 1965  Today's Date: 04/13/2023 PT Individual Time: 7829-5621 PT Individual Time Calculation (min): 39 min   Short Term Goals: Week 1:  PT Short Term Goal 1 (Week 1): Pt will STS CGA w/ LRAD PT Short Term Goal 2 (Week 1): Pt will ambulate 50 ft modA w/ LRAD PT Short Term Goal 3 (Week 1): Pt will navigate ramp modA w/ LRAD PT Short Term Goal 4 (Week 1): Pt w/ perform bed to chair transfer w/ minA using LRAD PT Short Term Goal 5 (Week 1): Pt will require minA for bed mobility  Skilled Therapeutic Interventions/Progress Updates:    Pt received sitting in w/c and agreeable to therapy session. Pt already wearing thigh high TEDs and abdominal binder not present in room. Transported to/from gym in w/c for time management and energy conservation.  Vitals assessed throughout session as follows:  Sitting in w/c to start: BP 113/75 (MAP 86), HR 90bpm  After standing: BP 102/66 (MAP 79), HR 96bpm - pt unable to remain standing for dynamap to complete the reading and ended up sitting during due to feelings of "lightheadedness" and "fatigue: After 181ft of gait: BP 120/80 (MAP 92), HR 94bpm, SpO2 99% After 37ft of gait:  BP 130/86 (MAP 101), HR 96bpm, SpO2 100%  Sit>stands w/c>RW with pt able to recall need to scoot forward and cross arms over his chest to maintain sternal precautions - on 1st stand required 2x attempts to come to standing and cues to increase anterior weight shift but remainder of session improved to being able to come to standing with min A for balance.  Gait training 168ft using RW with min assist for balance and +2 for line management and w/c follow to allow increased distance. Pt demonstrating the following gait deviations with therapist providing the described cuing and facilitation for improvement:  - pt has excessive LLE internal rotation throughout - decreased gait speed   - reciprocal stepping pattern - slight balance instability   Gait training 74ft using +2 B HHA with skilled mod assist of 1 for balance and +2 on R side providing HHA. Pt demonstrating the following gait deviations with therapist providing the described cuing and facilitation for improvement:  - continues to have excessive L LE hip internal rotation - increased balance instability with R/L lateral trunk lean over stance limbs due to this compared to when using RW - reciprocal stepping pattern with poor control over momentum, especially when turning to sit at end of gait, pt moving quickly and uncontrolled  R stand pivot EOM>w/c, no AD, with min A for balance and cuing to turn fully prior to sitting.   Transported back to room and pt left seated in w/c with needs in reach and seat belt alarm on. Of note: pt unable to recall how to use call button to request assistance for toileting requiring reinforcement of education for this - pt also requires increased time to visually scan L and find call button.  Therapy Documentation Precautions:  Precautions Precautions: Sternal, Fall Precaution Comments: Verbal education provided during eval. Pt able to verbalize he "doesn't need to open up the incision w/ movement" Restrictions Weight Bearing Restrictions: No RUE Weight Bearing: Weight bearing as tolerated LUE Weight Bearing: Weight bearing as tolerated Other Position/Activity Restrictions: sternal precaution   Pain:  Reports shoulders are getting sore from being used more; however, pt is using them less at this time  due to sternal precautions so unsure of the cause of this discomfort.    Therapy/Group: Individual Therapy  Ginny Forth , PT, DPT, NCS, CSRS 04/13/2023, 1:02 PM

## 2023-04-13 NOTE — Telephone Encounter (Signed)
Per phase 1 cardiac rehab fax referral to [facility]

## 2023-04-14 ENCOUNTER — Inpatient Hospital Stay (HOSPITAL_BASED_OUTPATIENT_CLINIC_OR_DEPARTMENT_OTHER): Payer: Medicaid Other

## 2023-04-14 DIAGNOSIS — I059 Rheumatic mitral valve disease, unspecified: Secondary | ICD-10-CM

## 2023-04-14 DIAGNOSIS — R0609 Other forms of dyspnea: Secondary | ICD-10-CM

## 2023-04-14 DIAGNOSIS — R0602 Shortness of breath: Secondary | ICD-10-CM

## 2023-04-14 DIAGNOSIS — R600 Localized edema: Secondary | ICD-10-CM

## 2023-04-14 DIAGNOSIS — I9789 Other postprocedural complications and disorders of the circulatory system, not elsewhere classified: Secondary | ICD-10-CM

## 2023-04-14 DIAGNOSIS — I3139 Other pericardial effusion (noninflammatory): Secondary | ICD-10-CM

## 2023-04-14 DIAGNOSIS — I69398 Other sequelae of cerebral infarction: Secondary | ICD-10-CM

## 2023-04-14 HISTORY — DX: Other postprocedural complications and disorders of the circulatory system, not elsewhere classified: I97.89

## 2023-04-14 HISTORY — DX: Other pericardial effusion (noninflammatory): I31.39

## 2023-04-14 HISTORY — DX: Localized edema: R60.0

## 2023-04-14 HISTORY — DX: Shortness of breath: R06.02

## 2023-04-14 LAB — ECHOCARDIOGRAM COMPLETE
AR max vel: 3.38 cm2
AV Area VTI: 3.67 cm2
AV Area mean vel: 3.39 cm2
AV Mean grad: 10 mm[Hg]
AV Peak grad: 19.5 mm[Hg]
Ao pk vel: 2.21 m/s
Area-P 1/2: 3.6 cm2
Height: 72 in
MV VTI: 2.14 cm2
S' Lateral: 2.9 cm
Weight: 3883.62 [oz_av]

## 2023-04-14 LAB — CBC
HCT: 25.5 % — ABNORMAL LOW (ref 39.0–52.0)
Hemoglobin: 7.6 g/dL — ABNORMAL LOW (ref 13.0–17.0)
MCH: 26.9 pg (ref 26.0–34.0)
MCHC: 29.8 g/dL — ABNORMAL LOW (ref 30.0–36.0)
MCV: 90.1 fL (ref 80.0–100.0)
Platelets: 364 10*3/uL (ref 150–400)
RBC: 2.83 MIL/uL — ABNORMAL LOW (ref 4.22–5.81)
RDW: 17.7 % — ABNORMAL HIGH (ref 11.5–15.5)
WBC: 7.2 10*3/uL (ref 4.0–10.5)
nRBC: 0 % (ref 0.0–0.2)

## 2023-04-14 LAB — GLUCOSE, CAPILLARY
Glucose-Capillary: 93 mg/dL (ref 70–99)
Glucose-Capillary: 109 mg/dL — ABNORMAL HIGH (ref 70–99)
Glucose-Capillary: 84 mg/dL (ref 70–99)
Glucose-Capillary: 128 mg/dL — ABNORMAL HIGH (ref 70–99)

## 2023-04-14 LAB — PROTIME-INR
INR: 3.3 — ABNORMAL HIGH (ref 0.8–1.2)
Prothrombin Time: 33.9 s — ABNORMAL HIGH (ref 11.4–15.2)

## 2023-04-14 MED ORDER — AMIODARONE HCL 200 MG PO TABS
200.0000 mg | ORAL_TABLET | Freq: Every day | ORAL | Status: DC
  Administered 2023-04-14 – 2023-04-26 (×13): 200 mg via ORAL
  Filled 2023-04-14 (×13): qty 1

## 2023-04-14 MED ORDER — AMIODARONE HCL 200 MG PO TABS: 200.0000 mg | ORAL_TABLET | Freq: Every day | ORAL | Status: DC

## 2023-04-14 MED ORDER — WARFARIN SODIUM 2 MG PO TABS
2.0000 mg | ORAL_TABLET | Freq: Once | ORAL | Status: AC
  Administered 2023-04-14: 2 mg via ORAL
  Filled 2023-04-14: qty 1

## 2023-04-14 MED ORDER — TORSEMIDE 20 MG PO TABS
20.0000 mg | ORAL_TABLET | Freq: Every day | ORAL | Status: DC
  Administered 2023-04-18 – 2023-04-26 (×9): 20 mg via ORAL
  Filled 2023-04-14 (×9): qty 1

## 2023-04-14 NOTE — Plan of Care (Signed)
  Problem: Skin Integrity: Goal: Wound healing without signs and symptoms of infection Outcome: Progressing Goal: Risk for impaired skin integrity will decrease Outcome: Progressing   Problem: Activity: Goal: Ability to tolerate increased activity will improve Outcome: Progressing

## 2023-04-14 NOTE — TOC CM/SW Note (Signed)
Received message from [facility] that patient was screened for Medicaid and need an medical letter completed. Letter completed and sent to [employer], [place]. Isidoro Donning RN3 CCM, Heart Failure TOC CM [phone]

## 2023-04-14 NOTE — Progress Notes (Signed)
Physical Therapy Session Note  Patient Details  Name: Dale Mills MRN: [id number] Date of Birth: 09/10/1965  Today's Date: 04/14/2023 PT Individual Time: 4098-1191 + 1000-1059 PT Individual Time Calculation (min): 54 min  + 59 min  Short Term Goals: Week 1:  PT Short Term Goal 1 (Week 1): Pt will STS CGA w/ LRAD PT Short Term Goal 2 (Week 1): Pt will ambulate 50 ft modA w/ LRAD PT Short Term Goal 3 (Week 1): Pt will navigate ramp modA w/ LRAD PT Short Term Goal 4 (Week 1): Pt w/ perform bed to chair transfer w/ minA using LRAD PT Short Term Goal 5 (Week 1): Pt will require minA for bed mobility  Skilled Therapeutic Interventions/Progress Updates:      1st session: Pt lying in bed to start. No c/o pain. Donned thigh-high compression socks with totalA at bed level. With questioning, pt reports his primary deficits are his vision and general deconditioning. Pt able to recall "move in the tube" precautions. Min cues throughout session for complying.   Supine<>sitting EOB with supervision with HOB raised - needing cues for log rolling technique to avoid pulling from bed rail near [facility]. Sit<>Stand from raised EOB to RW with CGA with hands crossed over chest. He ambulates with CGA and RW in his room to the wheelchair with cues for safety approach and fully turning to sit.   Transported to day room rehab gym for time.   Pt requesting to "shoot hoops" so we did this to start for an active warm up. Pt completes sit<>stand to RW with CGA with cues for hand placement to stay within the tube. Able to tolerate standing long enough to take ~10 shots of basketball (goal ~36ft) and completed x5 sets of this, CGA for balance and RW for support. (Shooting with his L hand). Pt reports difficulty with depth perception related to vision impairments from his CVA. BP taken in sitting and standing:  BP sitting: 117/77 HR 96 BP standing: 116/73 HR 97  Gait training 11ft with CGA and RW on level surfaces - cues  for upright posture, forward gaze, and reducing pressure through UE on walker frame. Seated rest break needed 2/2 fatigue but recovers quickly with rest break.   Pt assisted onto the Kinetron and completed x8 minutes at L50cm/sec resistance - encouraged full ROM for terminal extension and keeping cadence up to challenge endurance.   Pt returned to his room - ended session seated in w/c with safety belt alarm on, call bell in reach, all needs met.     2nd session: Pt sitting in w/c to start - RN present to hook up IV and provide medications. Pt agreeable to therapy - has no c/o pain.   Pt transported in w/c to main rehab gym. Upon entering, patient requesting to use the bathroom to void (on Lasix and reports frequent voiding).   Pt taken to the ADL apartment room. Ambulated on carpeted surfaces with CGA and RW with PT managing IV pole. Pt continent of bladder void in standing with CGA for balance - pt relying on grab bar for UE support.   Pt then returned to the rehab gym to continue therapy treatment. Gait training 273ft + 200 ft with CGA and RW - pt running into L side of door frames with his RW, cues for visual scanning with light house technique which becomes more difficult for him as he fatigues.   Pt instructed in stair training using 6" steps and 2 hand  rails - instructed to avoid "pulling" with BUE but rather use for stability only. Pt navigated 4 + 4 steps with CGA and seated rest breaks b/w sets - demonstrates step-to pattern for both directions, no knee buckling observed but unsteadiness present.   Pt requesting to use the bathroom again during session. Returned to his room in w/c and patient used the urinal due to urgency. Both episodes documented. All needs met at end with safety belt alarm on.   Therapy Documentation Precautions:  Precautions Precautions: Sternal, Fall Precaution Comments: Verbal education provided during eval. Pt able to verbalize he "doesn't need to open up  the incision w/ movement" Restrictions Weight Bearing Restrictions: Yes (Sternal precautions) RUE Weight Bearing: Weight bearing as tolerated LUE Weight Bearing: Weight bearing as tolerated Other Position/Activity Restrictions: sternal precaution General:      Therapy/Group: Individual Therapy  Dale Mills Dale Mills  PT, DPT, CSRS  04/14/2023, 7:55 AM

## 2023-04-14 NOTE — Progress Notes (Signed)
Speech Language Pathology Daily Session Note  Patient Details  Name: Dale Mills MRN: [id number] Date of Birth: Nov 29, 1965  Today's Date: 04/14/2023 SLP Individual Time: 1345-1445 SLP Individual Time Calculation (min): 60 min  Short Term Goals: Week 1: SLP Short Term Goal 1 (Week 1): Patient will attend to left visual field during functional tasks with Min verbal cues SLP Short Term Goal 2 (Week 1): Patient will utilize speech intelligibility strategies at the sentence level to achieve 100% intelligibility with Min verbal cues. SLP Short Term Goal 3 (Week 1): Patient will demonstrate selective attention to tasks in a mildly distracting enviornment for 15 minutes with Min verbal cues for redirection. SLP Short Term Goal 4 (Week 1): Patient will recall novel information with 80% acc given min A SLP Short Term Goal 5 (Week 1): Patient will demonstrate ability to problem solve in mildly complex functional situations with min multimodal A  Skilled Therapeutic Interventions: Skilled therapy session focused on problem solving and attention goals. SLP faciliated session by providing minA during categorical task. Patient was instructed to match cards by shape, color or number and verbalize reasoning for each. After initial task completion, patient was instructed to gather cards in a pile according to the number of items on each. During task, patient independently utilized L to R scanning strategy to attend to the entire field of items. Patient required supervision A to attend to entirety of session when in a mildly distractive environment (people, cars, etc). At the conclusion of the session, patient able to independently recall 2/4 sternal precautions and the remainder with minA. Patient left in chair with alarm set and call bell in reach.   Pain None reported  Therapy/Group: Individual Therapy  Rylee Huestis M.A., CF-SLP 04/14/2023, 3:37 PM

## 2023-04-14 NOTE — Progress Notes (Addendum)
PROGRESS NOTE   Subjective/Complaints: Appreciate cards consult, pt had episode of CP last noc but this improved with repositioning in bed  Review of Systems  Constitutional:  Negative for chills, fever and malaise/fatigue.  Eyes:  Positive for blurred vision.  Respiratory:  Positive for shortness of breath.   Cardiovascular:  Positive for chest pain.  Gastrointestinal:  Positive for diarrhea.  Musculoskeletal:  Positive for joint pain.  Skin:  Negative for rash.  Neurological:  Positive for weakness.  Psychiatric/Behavioral:  The patient has insomnia.     Objective:   DG Chest 2 View  Result Date: 04/12/2023 CLINICAL DATA:  Shortness of breath EXAM: CHEST - 2 VIEW COMPARISON:  04/08/23 FINDINGS: Cardiac shadow is enlarged. Postsurgical changes are again seen. Right PICC is noted in satisfactory position. Lungs are well aerated bilaterally. Increased left basilar airspace opacity is noted. No sizable effusion is noted. No bony abnormality is seen. IMPRESSION: Left basilar airspace opacity. Electronically Signed   By: Alcide Clever M.D.   On: 04/12/2023 19:15   Recent Labs    04/14/23 0336  WBC 7.2  HGB 7.6*  HCT 25.5*  PLT 364   Recent Labs    04/13/23 0414  NA 137  K 3.4*  CL 104  CO2 25  GLUCOSE 100*  BUN 11  CREATININE 0.64  CALCIUM 8.7*    Intake/Output Summary (Last 24 hours) at 04/14/2023 0845 Last data filed at 04/14/2023 0831 Gross per 24 hour  Intake 1786.24 ml  Output 2700 ml  Net -913.76 ml        Physical Exam: Vital Signs Blood pressure 120/83, pulse 79, temperature 98.1 F (36.7 C), temperature source Oral, resp. rate 16, height 6' (1.829 m), weight 110.1 kg, SpO2 96%.  General: No acute distress Mood and affect are appropriate Heart: Regular rate and rhythm no rubs murmurs or extra sounds Lungs: Clear to auscultation, breathing unlabored, no rales or wheezes Abdomen: Positive bowel  sounds, soft nontender to palpation, nondistended Extremities: No clubbing, cyanosis, 1+ edema left pretibial trace right pretibial edema Skin: No evidence of breakdown, no evidence of rash Neurologic: Cranial nerves II through XII intact, motor strength is 5/5 in bilateral deltoid, bicep, tricep, grip, 4/5 hip flexor, knee extensors, ankle dorsiflexor and plantar flexor No changes Left visual field cut with neglect partially compensated Musculoskeletal: Full range of motion in all 4 extremities. No joint swelling    Assessment/Plan: 1. Functional deficits which require 3+ hours per day of interdisciplinary therapy in a comprehensive inpatient rehab setting. Physiatrist is providing close team supervision and 24 hour management of active medical problems listed below. Physiatrist and rehab team continue to assess barriers to discharge/monitor patient progress toward functional and medical goals  Care Tool:  Bathing    Body parts bathed by patient: Left arm, Chest, Right arm, Abdomen, Front perineal area, Buttocks, Face   Body parts bathed by helper: Right lower leg, Left lower leg, Left upper leg, Right upper leg     Bathing assist Assist Level: Moderate Assistance - Patient 50 - 74%     Upper Body Dressing/Undressing Upper body dressing   What is the patient wearing?: Hospital gown only  Upper body assist Assist Level: Moderate Assistance - Patient 50 - 74%    Lower Body Dressing/Undressing Lower body dressing      What is the patient wearing?: Pants, Underwear/pull up     Lower body assist Assist for lower body dressing: Maximal Assistance - Patient 25 - 49%     Toileting Toileting    Toileting assist Assist for toileting: Maximal Assistance - Patient 25 - 49%     Transfers Chair/bed transfer  Transfers assist     Chair/bed transfer assist level: Moderate Assistance - Patient 50 - 74%     Locomotion Ambulation   Ambulation assist      Assist  level: Moderate Assistance - Patient 50 - 74% Assistive device: Walker-rolling Max distance: 21 ft   Walk 10 feet activity   Assist     Assist level: Moderate Assistance - Patient - 50 - 74% Assistive device: Walker-rolling   Walk 50 feet activity   Assist Walk 50 feet with 2 turns activity did not occur: Safety/medical concerns         Walk 150 feet activity   Assist Walk 150 feet activity did not occur: Safety/medical concerns         Walk 10 feet on uneven surface  activity   Assist Walk 10 feet on uneven surfaces activity did not occur: Safety/medical concerns         Wheelchair     Assist Is the patient using a wheelchair?: Yes Type of Wheelchair: Manual    Wheelchair assist level: Dependent - Patient 0%      Wheelchair 50 feet with 2 turns activity    Assist        Assist Level: Dependent - Patient 0%   Wheelchair 150 feet activity     Assist      Assist Level: Dependent - Patient 0%   Blood pressure 120/83, pulse 79, temperature 98.1 F (36.7 C), temperature source Oral, resp. rate 16, height 6' (1.829 m), weight 110.1 kg, SpO2 96%.   Medical Problem List and Plan: 1. 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             -patient may  shower             -ELOS/Goals: 10/15, PT/OT/SLP Sup  -Continue CIR 2.  L-internal jugular DVT/ A fib/Antithrombotics: -DVT/anticoagulation:  Pharmaceutical:LUE cephalic and internal jugular DVT Coumadin and Heparin bridge to change to lovenox at discharge             -antiplatelet therapy: ASA 3. Pain Management:  oxycodone or tramadol prn for pain.  4. Mood/Behavior/Sleep: LCSW to follow  for evaluation and support.              -antipsychotic agents: N/A             -- Order melatonin prn for insomnia.  Schedule trazodone nightly 5. Neuropsych/cognition: This patient is capable of making decisions on his own behalf. 6. Skin/Wound Care: Routine pressure relief measures.  7. Fluids/Electrolytes/Nutrition: Strict I/O. Check CMET in am.  8. 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 Fluid overload/SOB: Lasix 40 mg decreased to daily on 09/25 --developed cough last nigh with SOB 09/27 pm.  --daily weights with monitoring for signs of overload. .  -wt stable, monitor Edema in legs will give IV lasix x 1 today.  CXR unchanged, cards to f/u Wts are stable   Filed Weights   04/12/23 0500 04/13/23 0458 04/14/23 0500  Weight: 115.4 kg 115.7 kg 110.1 kg  Repeat IV lasix this am 10/4-appreciate cardiology consult , repeat ECHO pending   10. A fib: Continue on amiodarone 400 mg bid since 09/25-->taper? May need to contact cards for this  11. Pre diabetes/Stress induced hyperglycemia: Monitor BS ac/hs.  --Hgb A1C 6.2 (prediabetic). --diet changed to CM from regular. Continue Levemir 8 units daily with SSI prn for tighter BS control. .  --Transition insulin to metformin as intake improves.   -9/30 decrease levemir to 6 u CBG (last 3)  Recent Labs    04/13/23 1634 04/13/23 2050 04/14/23 0649  GLUCAP 126* 127* 93    12 ABLA:   Monitor for signs of bleeding             --transfuse prn <7.0 or if symptomatic. Unclear whether fatigue is related to anemia.  Walked further last week with similar Hgb  -9/30 hemoglobin stable at 7.9 yesterday CBC    Component Value Date/Time   WBC 7.2 04/14/2023 0336   RBC 2.83 (L) 04/14/2023 0336   HGB 7.6 (L) 04/14/2023 0336   HCT 25.5 (L) 04/14/2023 0336   PLT 364 04/14/2023 0336   MCV 90.1 04/14/2023 0336   MCH  26.9 04/14/2023 0336   MCHC 29.8 (L) 04/14/2023 0336   RDW 17.7 (H) 04/14/2023 0336      13. Constipation: Resolved and now w/diarrhea. Will hold laxative.    -9/30 frequent BM not liquid, continue to hold laxatives  14. Hyponatremia,mild  -Recheck Thursday  15.  Hypo K+ mild add KCL daily repeat BMET on Monday or as per cardiology  LOS: 5 days A FACE TO FACE EVALUATION WAS PERFORMED  Erick Colace 04/14/2023, 8:45 AM

## 2023-04-14 NOTE — Progress Notes (Signed)
Occupational Therapy Session Note  Patient Details  Name: Dale Mills MRN: [id number] Date of Birth: June 22, 1966  Today's Date: 04/14/2023 OT Individual Time: 1116-1200 OT Individual Time Calculation (min): 44 min  OT Individual Time: 1300-1345 OT Individual Time Calculation (min): 45 min   Short Term Goals: Week 1:  OT Short Term Goal 1 (Week 1): Pt will be able to recall sternal precautions during self care tasks 3/5 sessions with minimal cueing. OT Short Term Goal 2 (Week 1): Pt will complete LB dressing with Mod A utilizing AE as needed. OT Short Term Goal 3 (Week 1): Pt will completed LB bathing with Min A utilizing AE as needed.  Skilled Therapeutic Interventions/Progress Updates:     AM Session:  Pt received sitting up in wc, dressed and ready for the day upon OT arrival with all BADL needs met and bilateral thigh high TEDs donned. Pt politely declining need for BR at this time. Pt presenting to be in good spirits receptive to skilled OT session reporting 0/10 pain- OT offering intermittent rest breaks, repositioning, and therapeutic support to optimize participation in therapy session. Transported Pt total A to therapy gym in wc for time management and energy conservation. Engaged Pt in sitting/standing checkers game at elevated table top to work on dynamic standing balance, standing tolerance, functional cognition, and visual scanning to L. Positioned checkers on L side of table to Pt instructed to don checkers onto board to set-up game while standing using RW. Pt completed sit>stand with CGA with improved recall of sternal precautions crossing B UE over chest in preparation for stand. Pt able to tolerate standing ~2 minutes while donning checkers with min verbal cues required to locate checkers on L side and to correctly place checkers on board. Seated rest break provided following d/t fatigue and mild SOB. Pt able to verbalize instructions for following checkers game with min A  questioning cues required for recall. During game, Pt easily distracted by other therapists/patients in therapy gym requiring mod verbal cues to attend to task and to recall sequence for turn taking. Pt requesting to use restroom at end of session. Transported back to room total A in wc for time management/energy conservation. Pt completed short distance functional mobility to BR using RW with CGA and stood to void with CGA for balance. Pt ambulated back to wc with min A for balance d/t fatigue. Pt was left resting in wc with call bell in reach, seatbelt alarm on, and all needs met.    PM Session:  Pt received sitting up in wc presenting to be in good spirits receptive to skilled OT session reporting 0/10 pain- OT offering intermittent rest breaks, repositioning, and therapeutic support to optimize participation in therapy session. Pt urgently requesting BR d/t need for BM. Engaged Pt in completing functional mobility int BR using RW with CGA provided for balance. Pt provided increased time on toilet- continent void & BM documented in flowsheets. Provided education on technique for completing posterior peri-care while maintaining sternal precautions with Pt able to complete peri-care in standing using RW with CGA provided for balance and seated rest break provided following. Pt completed sit>stand using RW while maintaining sternal precautions and brought pants to waist with CGA. Pt ambulated back to room using RW and chose to sit in wc for energy conservation. Provided education on energy conservation techniques and CVA recovery/etiology with Pt receptive to education. Pt able apply EC principles to BADLs reporting 3 techniques to conserve energy including 1) completing tasks  sitting down, 2) giving himself extra time, and 3) taking increased rest breaks when needed demonstrating teach back as evidence of learning. Pt would benefit from continued education to support learning and carryover at home. Pt was left  resting in c with call bell in reach, seat belt alarm on, and all needs met.    Therapy Documentation Precautions:  Precautions Precautions: Sternal, Fall Precaution Comments: Verbal education provided during eval. Pt able to verbalize he "doesn't need to open up the incision w/ movement" Restrictions Weight Bearing Restrictions: Yes (Sternal precautions) RUE Weight Bearing: Weight bearing as tolerated LUE Weight Bearing: Weight bearing as tolerated Other Position/Activity Restrictions: sternal precaution   Therapy/Group: Individual Therapy  Clide Deutscher 04/14/2023, 8:00 AM

## 2023-04-14 NOTE — Progress Notes (Signed)
Rounding Note    Patient Name: Dale Mills Date of Encounter: 04/14/2023  [facility] Cardiologist: Gypsy Balsam, MD   Subjective   Saw patient prior to him going down for his echo. He reported breathing was slightly improved, as was LE edema. Called by echo tech that he has large pericardial effusion. Images reviewed, see below.  Inpatient Medications    Scheduled Meds:  (feeding supplement) PROSource Plus  30 mL Oral BID BM   amiodarone  200 mg Oral Daily   ascorbic acid  500 mg Oral Daily   aspirin EC  81 mg Oral Daily   atorvastatin  80 mg Oral Daily   Chlorhexidine Gluconate Cloth  6 each Topical Q12H   ezetimibe  10 mg Oral Daily   Fe Fum-Vit C-Vit B12-FA  1 capsule Oral QPC breakfast   furosemide  40 mg Intravenous Daily   insulin aspart  2-6 Units Subcutaneous TID AC & HS   insulin detemir  6 Units Subcutaneous Daily   metoprolol tartrate  12.5 mg Oral BID   nutrition supplement (JUVEN)  1 packet Oral BID BM   mouth rinse  15 mL Mouth Rinse 4 times per day   pantoprazole  40 mg Oral Daily   potassium chloride  30 mEq Oral BID   [START ON 04/16/2023] potassium chloride  20 mEq Oral Daily   Ensure Max Protein  11 oz Oral BID   sodium chloride flush  10-40 mL Intracatheter Q12H   tamsulosin  0.4 mg Oral QPC supper   [START ON 04/18/2023] torsemide  20 mg Oral Daily   traZODone  50 mg Oral QHS   warfarin  2 mg Oral ONCE-1600   Warfarin - Pharmacist Dosing Inpatient   Does not apply q1600   Continuous Infusions:  ampicillin (OMNIPEN) IV 2 g (04/14/23 1459)   cefTRIAXone (ROCEPHIN)  IV 2 g (04/14/23 1101)   PRN Meds: acetaminophen, alum & mag hydroxide-simeth, bisacodyl, diphenhydrAMINE, guaiFENesin-dextromethorphan, melatonin, oxyCODONE, prochlorperazine **OR** prochlorperazine **OR** prochlorperazine, sodium chloride flush, sodium phosphate, traMADol   Vital Signs    Vitals:   04/14/23 0500 04/14/23 0543 04/14/23 0953 04/14/23 1446  BP:   120/83 115/78 112/70  Pulse:  79 94 87  Resp:  16  18  Temp:  98.1 F (36.7 C)  97.6 F (36.4 C)  TempSrc:  Oral  Oral  SpO2:  96%  99%  Weight: 110.1 kg     Height:        Intake/Output Summary (Last 24 hours) at 04/14/2023 1751 Last data filed at 04/14/2023 1600 Gross per 24 hour  Intake 1422.24 ml  Output 2550 ml  Net -1127.76 ml      04/14/2023    5:00 AM 04/13/2023    4:58 AM 04/12/2023    5:00 AM  Last 3 Weights  Weight (lbs) 242 lb 11.6 oz 255 lb 1.2 oz 254 lb 6.6 oz  Weight (kg) 110.1 kg 115.7 kg 115.4 kg      Telemetry    Not on telemetry - Personally Reviewed  Physical Exam   GEN: No acute distress.   Neck: No JVD Cardiac: RRR, no rubs, or gallops. 1/6 systolic aortic murmur and 1/6 systolic mitral murmur  Respiratory: Improving crackles at bilateral bases GI: Soft, nontender, non-distended  MS: Trace bilateral LE edema with compression stockings in place; No deformity. Neuro:  Nonfocal  Psych: Normal affect   New pertinent results (labs, ECG, imaging, cardiac studies)    Echo reviewed.  Large pericardial effusion without evidence of tamponade. Well functioning AVR and MVR. Normal LVEF with LV hypertrophy  Patient Profile     57 y.o. male with a hx of endocarditis s/p AVR, MVR, aortic root replacement due to e faecalis bacteremia and septic embolic CVA who is being seen for the evaluation of shortness of breath at the request of Dr. Wynn Banker.   Assessment & Plan    Shortness of breath Pericardial effusion -weight decreased from 115.7 kg yesterday to 110.1 kg today, suspect this is inaccurate -has received IV lasix x2. -on echo 10/4, noted to have large asymptomatic pericardial effusion. This is likely post pericardiotomy from his surgery. This typically peaks around day 10 (he is POD 14), which would line up with his peak symptoms. There is not strong data that colchicine changes the course in asymptomatic patients. I will check ESR and CRP, expecting  them to be slightly elevated, but they should trend down. If they trend up, would then consider colchicine treatment -would aim for gentle continued diuresis. Would give IV lasix for three more days, then change to oral torsemide (ordered). If he becomes hypotensive, would stop diuretic and contact cardiology -typically this will resolve with time. Will schedule repeat echo for about 2-3 weeks to make sure it is decreasing in size.   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 Already has scheduled follow up with CT surgery and cardiology in [place]. Please contact us with questions or concerns.    Signed, Jodelle Red, MD  04/14/2023, 5:51 PM

## 2023-04-14 NOTE — Progress Notes (Signed)
ANTICOAGULATION CONSULT NOTE- follow-up  Pharmacy Consult for warfarin Indication: atrial fibrillation/ LUE DVT  No Known Allergies  Patient Measurements: Height: 6' (182.9 cm) Weight: 110.1 kg (242 lb 11.6 oz) IBW/kg (Calculated) : 77.6 Heparin Dosing Weight: 100 kg  Vital Signs: Temp: 98.1 F (36.7 C) (10/04 0543) Temp Source: Oral (10/04 0543) BP: 120/83 (10/04 0543) Pulse Rate: 79 (10/04 0543)  Labs: Recent Labs    04/12/23 0400 04/13/23 0414 04/14/23 0336  HGB  --   --  7.6*  HCT  --   --  25.5*  PLT  --   --  364  LABPROT 26.2* 29.9* 33.9*  INR 2.4* 2.8* 3.3*  CREATININE  --  0.64  --     Estimated Creatinine Clearance: 130.6 mL/min (by C-G formula based on SCr of 0.64 mg/dL).   Medical History: Past Medical History:  Diagnosis Date   Angina pectoris (HCC) Canadian classification 2 04/10/2020   Anxiety    Arthritis    Coronary artery disease coronary CT angio did not show any major issues, suspicion for distal disease 06/12/2020   Depression    DJD (degenerative joint disease)    Dyslipidemia 02/13/2020   Essential hypertension 02/13/2020   Precordial chest pain 02/13/2020    Medications:  Scheduled:   (feeding supplement) PROSource Plus  30 mL Oral BID BM   amiodarone  400 mg Oral BID   ascorbic acid  500 mg Oral Daily   aspirin EC  81 mg Oral Daily   atorvastatin  80 mg Oral Daily   Chlorhexidine Gluconate Cloth  6 each Topical Q12H   ezetimibe  10 mg Oral Daily   Fe Fum-Vit C-Vit B12-FA  1 capsule Oral QPC breakfast   furosemide  40 mg Intravenous Daily   insulin aspart  2-6 Units Subcutaneous TID AC & HS   insulin detemir  6 Units Subcutaneous Daily   metoprolol tartrate  12.5 mg Oral BID   nutrition supplement (JUVEN)  1 packet Oral BID BM   mouth rinse  15 mL Mouth Rinse 4 times per day   pantoprazole  40 mg Oral Daily   potassium chloride  30 mEq Oral BID   [START ON 04/16/2023] potassium chloride  20 mEq Oral Daily   Ensure Max  Protein  11 oz Oral BID   sodium chloride flush  10-40 mL Intracatheter Q12H   tamsulosin  0.4 mg Oral QPC supper   traZODone  50 mg Oral QHS   warfarin  4 mg Oral q1600   Warfarin - Pharmacist Dosing Inpatient   Does not apply q1600   Infusions:   ampicillin (OMNIPEN) IV 2 g (04/14/23 0313)   cefTRIAXone (ROCEPHIN)  IV Stopped (04/13/23 2135)    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 Having recurrent Afib postop, pharmacy now consulted to start warfarin for Afib. Acute DVT noted in the left internal jugular and a superficial thrombosis of left upper arm.  Pt has been refusing SCD's.   Ampicillin started 9/18 >> (11/1) Ceftriaxone started 9/18 >> (11/1) Amiodarone started 9/23, dose reduced to 200 mg daily on 10/4  INR is slightly supratherapeutic today at 3.3. CBC stable. No bleeding noted.  Goal of Therapy:  INR 2-3   Plan: -Warfarin 2 mg x 1 dose -Daily INR, CBC every 3 days    Thank you for allowing  pharmacy to be a part of this patient's care.   Signe Colt, PharmD 04/14/2023 8:51 AM  **Pharmacist phone directory can be found on [email or web address] listed under [facility]**

## 2023-04-14 NOTE — Progress Notes (Signed)
Endorsed having "chest pain" this morning with morning vitals and CHG bath. Patient was lower in the bed. When asked about any issues with breathing did endorse sensation of feeling short of breath. Repositioned patient, elevated the head of the bed, and placed pillow behind patient's head. Expressed immediate relief with chest discomfort. Also, encouraged patient with coughing to utilize the pillow to brace his chest where incision site is to avoid any complications. Rounded about an hour after and patient explained no chest discomfort at this time. Rated the pain prior as a "3" and was a pressure but pain was not sharp. Denied any other symptoms at that time.

## 2023-04-15 DIAGNOSIS — I3139 Other pericardial effusion (noninflammatory): Secondary | ICD-10-CM

## 2023-04-15 DIAGNOSIS — E8779 Other fluid overload: Secondary | ICD-10-CM

## 2023-04-15 LAB — GLUCOSE, CAPILLARY
Glucose-Capillary: 93 mg/dL (ref 70–99)
Glucose-Capillary: 96 mg/dL (ref 70–99)
Glucose-Capillary: 95 mg/dL (ref 70–99)
Glucose-Capillary: 140 mg/dL — ABNORMAL HIGH (ref 70–99)

## 2023-04-15 LAB — C-REACTIVE PROTEIN: CRP: 3.4 mg/dL — ABNORMAL HIGH (ref <1.0)

## 2023-04-15 LAB — SEDIMENTATION RATE: Sed Rate: 72 mm/h — ABNORMAL HIGH (ref 0–16)

## 2023-04-15 LAB — PROTIME-INR
INR: 2.8 — ABNORMAL HIGH (ref 0.8–1.2)
Prothrombin Time: 29.4 s — ABNORMAL HIGH (ref 11.4–15.2)

## 2023-04-15 MED ORDER — WARFARIN SODIUM 4 MG PO TABS
4.0000 mg | ORAL_TABLET | Freq: Every day | ORAL | Status: DC
  Administered 2023-04-15: 4 mg via ORAL
  Filled 2023-04-15: qty 1

## 2023-04-15 NOTE — Progress Notes (Signed)
ANTICOAGULATION CONSULT NOTE- follow-up  Pharmacy Consult for warfarin Indication: atrial fibrillation/ LUE DVT  No Known Allergies  Patient Measurements: Height: 6' (182.9 cm) Weight: 109.9 kg (242 lb 4.6 oz) IBW/kg (Calculated) : 77.6 Heparin Dosing Weight: 100 kg  Vital Signs: Temp: 97.4 F (36.3 C) (10/05 0421) Temp Source: Oral (10/05 0421) BP: 145/88 (10/05 0421) Pulse Rate: 83 (10/05 0421)  Labs: Recent Labs    04/13/23 0414 04/14/23 0336 04/15/23 0347  HGB  --  7.6*  --   HCT  --  25.5*  --   PLT  --  364  --   LABPROT 29.9* 33.9* 29.4*  INR 2.8* 3.3* 2.8*  CREATININE 0.64  --   --     Estimated Creatinine Clearance: 130.4 mL/min (by C-G formula based on SCr of 0.64 mg/dL).   Medical History: Past Medical History:  Diagnosis Date   Angina pectoris (HCC) Canadian classification 2 04/10/2020   Anxiety    Arthritis    Coronary artery disease coronary CT angio did not show any major issues, suspicion for distal disease 06/12/2020   Depression    DJD (degenerative joint disease)    Dyslipidemia 02/13/2020   Essential hypertension 02/13/2020   Precordial chest pain 02/13/2020    Medications:  Scheduled:   (feeding supplement) PROSource Plus  30 mL Oral BID BM   amiodarone  200 mg Oral Daily   ascorbic acid  500 mg Oral Daily   aspirin EC  81 mg Oral Daily   atorvastatin  80 mg Oral Daily   Chlorhexidine Gluconate Cloth  6 each Topical Q12H   ezetimibe  10 mg Oral Daily   Fe Fum-Vit C-Vit B12-FA  1 capsule Oral QPC breakfast   furosemide  40 mg Intravenous Daily   insulin aspart  2-6 Units Subcutaneous TID AC & HS   insulin detemir  6 Units Subcutaneous Daily   metoprolol tartrate  12.5 mg Oral BID   nutrition supplement (JUVEN)  1 packet Oral BID BM   mouth rinse  15 mL Mouth Rinse 4 times per day   pantoprazole  40 mg Oral Daily   potassium chloride  30 mEq Oral BID   [START ON 04/16/2023] potassium chloride  20 mEq Oral Daily   Ensure Max  Protein  11 oz Oral BID   sodium chloride flush  10-40 mL Intracatheter Q12H   tamsulosin  0.4 mg Oral QPC supper   [START ON 04/18/2023] torsemide  20 mg Oral Daily   traZODone  50 mg Oral QHS   Warfarin - Pharmacist Dosing Inpatient   Does not apply q1600   Infusions:   ampicillin (OMNIPEN) IV 2 g (04/15/23 0417)   cefTRIAXone (ROCEPHIN)  IV Stopped (04/14/23 2048)    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 Having recurrent Afib postop, pharmacy now consulted to start warfarin for Afib. Acute DVT noted in the left internal jugular and a superficial thrombosis of left upper arm.  Pt has been refusing SCD's.   Ampicillin started 9/18 >> (11/1) Ceftriaxone started 9/18 >> (11/1) Amiodarone started 9/23, dose reduced to 200 mg daily on 10/4  INR is therapeutic today at 2.8. CBC stable. No bleeding noted.  Goal of Therapy:  INR 2-3   Plan: -Warfarin 4 mg PO daily -Daily INR, CBC every 3 days    Thank you for  allowing pharmacy to be a part of this patient's care.   Signe Colt, PharmD 04/15/2023 9:01 AM  **Pharmacist phone directory can be found on [email or web address] listed under [facility]**

## 2023-04-15 NOTE — Progress Notes (Signed)
Occupational Therapy Session Note  Patient Details  Name: Dale Mills MRN: [id number] Date of Birth: 02-20-1966  {CHL IP REHAB OT TIME CALCULATIONS:[id number]}   Short Term Goals: Week 1:  OT Short Term Goal 1 (Week 1): Pt will be able to recall sternal precautions during self care tasks 3/5 sessions with minimal cueing. OT Short Term Goal 2 (Week 1): Pt will complete LB dressing with Mod A utilizing AE as needed. OT Short Term Goal 3 (Week 1): Pt will completed LB bathing with Min A utilizing AE as needed.  Skilled Therapeutic Interventions/Progress Updates:  Pt received *** for skilled OT session with focus on ***. Pt agreeable to interventions, demonstrating overall *** mood. Pt reported ***/10 pain, stating "***" in reference to ***. OT offering intermediate rest breaks and positioning suggestions throughout session to address pain/fatigue and maximize participation/safety in session.    Pt remained *** with all immediate needs met at end of session. Pt continues to be appropriate for skilled OT intervention to promote further functional independence.   Therapy Documentation Precautions:  Precautions Precautions: Sternal, Fall Precaution Comments: Verbal education provided during eval. Pt able to verbalize he "doesn't need to open up the incision w/ movement" Restrictions Weight Bearing Restrictions: Yes RUE Weight Bearing: Weight bearing as tolerated LUE Weight Bearing: Weight bearing as tolerated Other Position/Activity Restrictions: sternal precaution   Therapy/Group: Individual Therapy  Lou Cal, OTR/L, MSOT  04/15/2023, 9:04 PM

## 2023-04-15 NOTE — Progress Notes (Signed)
PROGRESS NOTE   Subjective/Complaints: Pt up in bed eating breakfast. No problems overnight or this morning.   Review of Systems  Constitutional:  Negative for chills, fever and malaise/fatigue.  Eyes:  Positive for blurred vision.  Respiratory:  Positive for shortness of breath.   Cardiovascular:  Negative for chest pain.  Gastrointestinal:  Negative for diarrhea.  Musculoskeletal:  Positive for joint pain.  Skin:  Negative for rash.  Neurological:  Positive for weakness.  Psychiatric/Behavioral:  The patient has insomnia.     Objective:   ECHOCARDIOGRAM COMPLETE  Result Date: 04/14/2023    ECHOCARDIOGRAM REPORT   Patient Name:   Dale Mills Date of Exam: 04/14/2023 Medical Rec #:  [id number]      Height:       72.0 in Accession #:    [id number]     Weight:       242.7 lb Date of Birth:  [birth year]      BSA:          2.313 m Patient Age:    57 years       BP:           112/70 mmHg Patient Gender: M              HR:           80 bpm. Exam Location:  Inpatient Procedure: 2D Echo, Color Doppler and Cardiac Doppler Indications:    Dyspnea  History:        Patient has prior history of Echocardiogram examinations, most                 recent 03/13/2023. CAD, AVR and root replacement (Edwards Konect                 Resilia 27mm) and MVR (Medtronic 33mm) 03/31/23,                 Signs/Symptoms:Bacteremia; Risk Factors:Dyslipidemia and                 Hypertension.                 Aortic Valve: 27 mm Edwards valve is present in the aortic                 position. Procedure Date: 03/31/2023.                 Mitral Valve: 33 mm Medtronic valve is present in the mitral                 position. Procedure Date: 03/31/2023.  Sonographer:    Milbert Coulter Referring Phys: Jodelle Red IMPRESSIONS  1. Left ventricular ejection fraction, by estimation, is 60 to 65%. The left ventricle has normal function. The left ventricle has no regional wall  motion abnormalities. There is severe concentric left ventricular hypertrophy. Left ventricular diastolic  parameters are indeterminate.  2. Right ventricular systolic function is normal. The right ventricular size is normal.  3. Left atrial size was mildly dilated.  4. Right atrial size was moderately dilated.  5. Trivial mitral valve regurgitation. No evidence of mitral stenosis. There is a 33 mm  Medtronic present in the mitral position. Procedure Date: 03/31/2023.  6. Right ventricle does not relax completely suggesting intrapercardial pressure. Large pericardial effusion. The pericardial effusion is circumferential. There is no evidence of cardiac tamponade.  7. Well-seated bioprosthetic aortic valve. Aortic valve regurgitation is not visualized. No aortic stenosis is present. There is a 27 mm Edwards valve present in the aortic position. Procedure Date: 03/31/2023. Aortic valve mean gradient measures 10.0 mmHg. Aortic valve Vmax measures 2.21 m/s.  8. The inferior vena cava is normal in size with greater than 50% respiratory variability, suggesting right atrial pressure of 3 mmHg. FINDINGS  Left Ventricle: Left ventricular ejection fraction, by estimation, is 60 to 65%. The left ventricle has normal function. The left ventricle has no regional wall motion abnormalities. The left ventricular internal cavity size was normal in size. There is  severe concentric left ventricular hypertrophy. Left ventricular diastolic parameters are indeterminate. Right Ventricle: The right ventricular size is normal. No increase in right ventricular wall thickness. Right ventricular systolic function is normal. Left Atrium: Left atrial size was mildly dilated. Right Atrium: Right atrial size was moderately dilated. Pericardium: Right ventricle does not relax completely suggesting intrapercardial pressure. A large pericardial effusion is present. The pericardial effusion is circumferential. There is no evidence of cardiac tamponade.  Mitral Valve: Well-seated bioprosthetic mitral valve. Trivial mitral valve regurgitation. There is a 33 mm Medtronic present in the mitral position. Procedure Date: 03/31/2023. No evidence of mitral valve stenosis. MV peak gradient, 14.4 mmHg. The mean mitral valve gradient is 7.5 mmHg. Tricuspid Valve: The tricuspid valve is normal in structure. Tricuspid valve regurgitation is not demonstrated. No evidence of tricuspid stenosis. Aortic Valve: Well-seated bioprosthetic aortic valve. Aortic valve regurgitation is not visualized. No aortic stenosis is present. Aortic valve mean gradient measures 10.0 mmHg. Aortic valve peak gradient measures 19.5 mmHg. Aortic valve area, by VTI measures 3.67 cm. There is a 27 mm Edwards valve present in the aortic position. Procedure Date: 03/31/2023. Pulmonic Valve: The pulmonic valve was normal in structure. Pulmonic valve regurgitation is not visualized. No evidence of pulmonic stenosis. Aorta: The aortic root is normal in size and structure. Venous: The inferior vena cava is normal in size with greater than 50% respiratory variability, suggesting right atrial pressure of 3 mmHg. IAS/Shunts: No atrial level shunt detected by color flow Doppler.  LEFT VENTRICLE PLAX 2D LVIDd:         4.60 cm   Diastology LVIDs:         2.90 cm   LV e' medial:    7.72 cm/s LV PW:         1.70 cm   LV E/e' medial:  17.6 LV IVS:        1.60 cm   LV e' lateral:   6.74 cm/s LVOT diam:     2.30 cm   LV E/e' lateral: 20.2 LV SV:         125 LV SV Index:   54 LVOT Area:     4.15 cm  RIGHT VENTRICLE RV Basal diam:  3.70 cm RV Mid diam:    2.80 cm RV S prime:     10.40 cm/s TAPSE (M-mode): 1.6 cm LEFT ATRIUM              Index        RIGHT ATRIUM           Index LA diam:        4.00 cm  1.73 cm/m  RA Area:     22.70 cm LA Vol (A2C):   118.0 ml 51.01 ml/m  RA Volume:   70.60 ml  30.52 ml/m LA Vol (A4C):   57.9 ml  25.03 ml/m LA Biplane Vol: 86.7 ml  37.48 ml/m  AORTIC VALVE AV Area (Vmax):    3.38 cm  AV Area (Vmean):   3.39 cm AV Area (VTI):     3.67 cm AV Vmax:           221.00 cm/s AV Vmean:          147.000 cm/s AV VTI:            0.340 m AV Peak Grad:      19.5 mmHg AV Mean Grad:      10.0 mmHg LVOT Vmax:         180.00 cm/s LVOT Vmean:        120.000 cm/s LVOT VTI:          0.300 m LVOT/AV VTI ratio: 0.88  AORTA Ao Root diam: 3.50 cm Ao Asc diam:  3.20 cm MITRAL VALVE MV Area (PHT): 3.60 cm     SHUNTS MV Area VTI:   2.14 cm     Systemic VTI:  0.30 m MV Peak grad:  14.4 mmHg    Systemic Diam: 2.30 cm MV Mean grad:  7.5 mmHg MV Vmax:       1.90 m/s MV Vmean:      133.0 cm/s MV Decel Time: 211 msec MV E velocity: 136.00 cm/s MV A velocity: 128.00 cm/s MV E/A ratio:  1.06 Kardie Tobb DO Electronically signed by Thomasene Ripple DO Signature Date/Time: 04/14/2023/4:45:43 PM    Final    Recent Labs    04/14/23 0336  WBC 7.2  HGB 7.6*  HCT 25.5*  PLT 364   Recent Labs    04/13/23 0414  NA 137  K 3.4*  CL 104  CO2 25  GLUCOSE 100*  BUN 11  CREATININE 0.64  CALCIUM 8.7*    Intake/Output Summary (Last 24 hours) at 04/15/2023 0951 Last data filed at 04/15/2023 0747 Gross per 24 hour  Intake 1274 ml  Output 1100 ml  Net 174 ml        Physical Exam: Vital Signs Blood pressure (!) 145/88, pulse 83, temperature (!) 97.4 F (36.3 C), temperature source Oral, resp. rate (!) 22, height 6' (1.829 m), weight 109.9 kg, SpO2 96%.  Constitutional: No distress . Vital signs reviewed. obese HEENT: NCAT, EOMI, oral membranes moist Neck: supple Cardiovascular: RRR without murmur. No JVD    Respiratory/Chest: CTA Bilaterally without wheezes or rales. Normal effort    GI/Abdomen: BS +, non-tender, non-distended Ext: no clubbing, cyanosis, or edema Psych: pleasant and cooperative  Skin: No evidence of breakdown, no evidence of rash Neurologic: Cranial nerves II through XII intact, motor strength is 5/5 in bilateral deltoid, bicep, tricep, grip, 4/5 hip flexor, knee extensors, ankle dorsiflexor  and plantar flexor No changes Left visual field cut with neglect partially compensated Musculoskeletal: Full range of motion in all 4 extremities. No joint swelling    Assessment/Plan: 1. Functional deficits which require 3+ hours per day of interdisciplinary therapy in a comprehensive inpatient rehab setting. Physiatrist is providing close team supervision and 24 hour management of active medical problems listed below. Physiatrist and rehab team continue to assess barriers to discharge/monitor patient progress toward functional and medical goals  Care Tool:  Bathing    Body parts bathed by patient: Left  arm, Chest, Right arm, Abdomen, Front perineal area, Buttocks, Face   Body parts bathed by helper: Right lower leg, Left lower leg, Left upper leg, Right upper leg     Bathing assist Assist Level: Moderate Assistance - Patient 50 - 74%     Upper Body Dressing/Undressing Upper body dressing   What is the patient wearing?: Hospital gown only    Upper body assist Assist Level: Moderate Assistance - Patient 50 - 74%    Lower Body Dressing/Undressing Lower body dressing      What is the patient wearing?: Pants, Underwear/pull up     Lower body assist Assist for lower body dressing: Maximal Assistance - Patient 25 - 49%     Toileting Toileting    Toileting assist Assist for toileting: Maximal Assistance - Patient 25 - 49%     Transfers Chair/bed transfer  Transfers assist     Chair/bed transfer assist level: Contact Guard/Touching assist     Locomotion Ambulation   Ambulation assist      Assist level: Contact Guard/Touching assist Assistive device: Walker-rolling Max distance: 277ft   Walk 10 feet activity   Assist     Assist level: Contact Guard/Touching assist Assistive device: Walker-rolling   Walk 50 feet activity   Assist Walk 50 feet with 2 turns activity did not occur: Safety/medical concerns  Assist level: Contact Guard/Touching  assist Assistive device: Walker-rolling    Walk 150 feet activity   Assist Walk 150 feet activity did not occur: Safety/medical concerns  Assist level: Contact Guard/Touching assist Assistive device: Walker-rolling    Walk 10 feet on uneven surface  activity   Assist Walk 10 feet on uneven surfaces activity did not occur: Safety/medical concerns         Wheelchair     Assist Is the patient using a wheelchair?: Yes Type of Wheelchair: Manual    Wheelchair assist level: Dependent - Patient 0%      Wheelchair 50 feet with 2 turns activity    Assist        Assist Level: Dependent - Patient 0%   Wheelchair 150 feet activity     Assist      Assist Level: Dependent - Patient 0%   Blood pressure (!) 145/88, pulse 83, temperature (!) 97.4 F (36.3 C), temperature source Oral, resp. rate (!) 22, height 6' (1.829 m), weight 109.9 kg, SpO2 96%.   Medical Problem List and Plan: 1. 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             -patient may  shower             -ELOS/Goals: 10/15, PT/OT/SLP Sup  -Continue CIR therapies including PT, OT  2.  L-internal jugular DVT/ A fib/Antithrombotics: -DVT/anticoagulation:  Pharmaceutical:LUE cephalic and internal jugular DVT Coumadin and Heparin bridge to change to lovenox at discharge             -antiplatelet therapy: ASA 3. Pain Management:  oxycodone or tramadol prn for pain.  4. Mood/Behavior/Sleep: LCSW to follow for evaluation and support.              -antipsychotic agents: N/A             -- Order melatonin prn for insomnia.  Scheduled trazodone nightly   -reports reasonable sleep 5.  Neuropsych/cognition: This patient is capable of making decisions on his own behalf. 6. Skin/Wound Care: Routine pressure relief measures.  7. Fluids/Electrolytes/Nutrition: Strict I/O. Check CMET in am.  8. 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 Fluid overload/SOB: Lasix 40 mg decreased to daily on 09/25 --developed cough last nigh with SOB 09/27 pm.  --daily weights with monitoring for signs of overload. .  -wt stable, monitor Edema in legs will give IV lasix x 1 today.  CXR unchanged, cards to f/u Wts are stable and actually decreased today 10/5   Filed Weights   04/13/23 0458 04/14/23 0500 04/15/23 0436  Weight: 115.7 kg 110.1 kg 109.9 kg  Repeat IV lasix this am 10/4-appreciate cardiology consult  10/5 echo demonstrates large pericardial effusion, likely post-op -card recommended followng CRP and ESR--both are elevated. If they trend up then potentially colchicine rx.    -iv lasix for 2 more doses then convert to oral 10/8 per cards   -has f/u crp and esr ordered for Monday   -no complaints today   -bp's holding  10. A fib: Continue on amiodarone 400 mg bid since 09/25-->taper? May need to contact cards for this  11. Pre diabetes/Stress induced hyperglycemia: Monitor BS ac/hs.  --Hgb A1C 6.2 (prediabetic). --diet changed to CM from regular. Continue Levemir 8 units daily with SSI prn for tighter BS control. .  --Transition insulin to metformin as intake improves.   -9/30 decrease levemir to 6 u CBG (last 3)  Recent Labs    04/14/23 1657 04/14/23 2107 04/15/23 0631  GLUCAP 84 128* 93  10/5 sugars under control  12 ABLA:   Monitor for signs of bleeding             --transfuse prn <7.0 or if symptomatic. Unclear whether fatigue is related to anemia.  Walked further last week with similar Hgb  -9/30 hemoglobin stable at 7.9 yesterday CBC     Component Value Date/Time   WBC 7.2 04/14/2023 0336   RBC 2.83 (L) 04/14/2023 0336   HGB 7.6 (L) 04/14/2023 0336   HCT 25.5 (L) 04/14/2023 0336   PLT 364 04/14/2023 0336   MCV 90.1 04/14/2023 0336   MCH 26.9 04/14/2023 0336   MCHC 29.8 (L) 04/14/2023 0336   RDW 17.7 (H) 04/14/2023 0336   10/5 hgb 7.6 yesterday--f/u Monday--suspect at least part of fatigue is related to this    13. Constipation: Resolved and now w/diarrhea. Will hold laxative.    -9/30 frequent BM not liquid, continue to hold laxatives  14. Hyponatremia,mild  -Recheck Thursday  15.  Hypo K+ mild add KCL daily repeat BMET on Monday or as per cardiology   LOS: 6 days A FACE TO FACE EVALUATION WAS PERFORMED  Ranelle Oyster 04/15/2023, 9:51 AM

## 2023-04-16 LAB — PROTIME-INR
INR: 2.9 — ABNORMAL HIGH (ref 0.8–1.2)
Prothrombin Time: 30.9 s — ABNORMAL HIGH (ref 11.4–15.2)

## 2023-04-16 LAB — GLUCOSE, CAPILLARY
Glucose-Capillary: 102 mg/dL — ABNORMAL HIGH (ref 70–99)
Glucose-Capillary: 119 mg/dL — ABNORMAL HIGH (ref 70–99)
Glucose-Capillary: 132 mg/dL — ABNORMAL HIGH (ref 70–99)
Glucose-Capillary: 112 mg/dL — ABNORMAL HIGH (ref 70–99)

## 2023-04-16 MED ORDER — WARFARIN SODIUM 3 MG PO TABS
3.0000 mg | ORAL_TABLET | Freq: Once | ORAL | Status: AC
  Administered 2023-04-16: 3 mg via ORAL
  Filled 2023-04-16: qty 1

## 2023-04-16 NOTE — Progress Notes (Signed)
Speech Language Pathology Daily Session Note  Patient Details  Name: Dale Mills MRN: [id number] Date of Birth: 08/18/1965  Today's Date: 04/16/2023 SLP Individual Time: 1355-1435 SLP Individual Time Calculation (min): 40 min  Short Term Goals: Week 1: SLP Short Term Goal 1 (Week 1): Patient will attend to left visual field during functional tasks with Min verbal cues SLP Short Term Goal 2 (Week 1): Patient will utilize speech intelligibility strategies at the sentence level to achieve 100% intelligibility with Min verbal cues. SLP Short Term Goal 3 (Week 1): Patient will demonstrate selective attention to tasks in a mildly distracting enviornment for 15 minutes with Min verbal cues for redirection. SLP Short Term Goal 4 (Week 1): Patient will recall novel information with 80% acc given min A SLP Short Term Goal 5 (Week 1): Patient will demonstrate ability to problem solve in mildly complex functional situations with min multimodal A  Skilled Therapeutic Interventions:  Pt was seen for skilled ST targeting cognitive goals.  Pt was received upright in wheelchair, awake, alert, and agreeable to participating in treatment.  SLP noted pt having some difficulty maintaining his train of thought as he was ordering his breakfast and lunch for tomorrow as pt had to ask dietary staff to repeat his order as he couldn't remember what he'd ordered and had increased response latency when asked for his meal choices.  Pt was transported to treatment room for today's appt.   Pt completed a basic calendar task (placing dates into a large print wall calendar) in a moderately distracting environment (door to room open with staff and patients walking by) with no cues needed to locate cards to the left of midline or to redirect to task.  SLP facilitated the session with a block building task to further address visual scanning and problem solving goals.  Pt consistently needed at least mod assist cues to accurately  place blocks on the left side of each targeted structure to recreate structures from pictures using foam blocks.  Pt was returned to room and left with call bell within reach and chair alarm set.  Continue per current plan of care.    Pain Pain Assessment Pain Scale: 0-10 Pain Score: 0-No pain  Therapy/Group: Individual Therapy  Kierstan Auer, Melanee Spry 04/16/2023, 4:11 PM

## 2023-04-16 NOTE — Progress Notes (Signed)
Complaining of "It is hard to breathe. It is kind of scary." HOB has been maintained at 30 degrees. When obtaining vitals earlier [facility] was at 30 degrees but at 21 degrees during this interaction with patient. Repositioned patient up in the bed and locked the HOB at 30 degrees. Stayed with patient and about two minutes expressed improvement and states "feeling better".

## 2023-04-16 NOTE — Progress Notes (Signed)
ANTICOAGULATION CONSULT NOTE- follow-up  Pharmacy Consult for warfarin Indication: atrial fibrillation/ LUE DVT  No Known Allergies  Patient Measurements: Height: 6' (182.9 cm) Weight: (S) 107.5 kg (236 lb 15.9 oz) IBW/kg (Calculated) : 77.6 Heparin Dosing Weight: 100 kg  Vital Signs: Temp: 97.9 F (36.6 C) (10/06 0421) Temp Source: Oral (10/06 0421) BP: 140/81 (10/06 0421) Pulse Rate: 86 (10/06 0613)  Labs: Recent Labs    04/14/23 0336 04/15/23 0347 04/16/23 0443  HGB 7.6*  --   --   HCT 25.5*  --   --   PLT 364  --   --   LABPROT 33.9* 29.4* 30.9*  INR 3.3* 2.8* 2.9*    Estimated Creatinine Clearance: 129.1 mL/min (by C-G formula based on SCr of 0.64 mg/dL).   Medical History: Past Medical History:  Diagnosis Date   Angina pectoris (HCC) [place] classification 2 04/10/2020   Anxiety    Arthritis    Coronary artery disease coronary CT angio did not show any major issues, suspicion for distal disease 06/12/2020   Depression    DJD (degenerative joint disease)    Dyslipidemia 02/13/2020   Essential hypertension 02/13/2020   Precordial chest pain 02/13/2020    Medications:  Scheduled:   (feeding supplement) PROSource Plus  30 mL Oral BID BM   amiodarone  200 mg Oral Daily   ascorbic acid  500 mg Oral Daily   aspirin EC  81 mg Oral Daily   atorvastatin  80 mg Oral Daily   Chlorhexidine Gluconate Cloth  6 each Topical Q12H   ezetimibe  10 mg Oral Daily   Fe Fum-Vit C-Vit B12-FA  1 capsule Oral QPC breakfast   furosemide  40 mg Intravenous Daily   insulin aspart  2-6 Units Subcutaneous TID AC & HS   insulin detemir  6 Units Subcutaneous Daily   metoprolol tartrate  12.5 mg Oral BID   nutrition supplement (JUVEN)  1 packet Oral BID BM   mouth rinse  15 mL Mouth Rinse 4 times per day   pantoprazole  40 mg Oral Daily   potassium chloride  20 mEq Oral Daily   Ensure Max Protein  11 oz Oral BID   sodium chloride flush  10-40 mL Intracatheter Q12H    tamsulosin  0.4 mg Oral QPC supper   [START ON 04/18/2023] torsemide  20 mg Oral Daily   traZODone  50 mg Oral QHS   warfarin  4 mg Oral q1600   Warfarin - Pharmacist Dosing Inpatient   Does not apply q1600   Infusions:   ampicillin (OMNIPEN) IV 2 g (04/16/23 0920)   cefTRIAXone (ROCEPHIN)  IV Stopped (04/15/23 2237)    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 Having recurrent Afib postop, pharmacy now consulted to start warfarin for Afib. Acute DVT noted in the left internal jugular and a superficial thrombosis of left upper arm.  Pt has been refusing SCD's.   Ampicillin started 9/18 >> (11/1) Ceftriaxone started 9/18 >> (11/1) Amiodarone started 9/23, dose reduced to 200 mg daily on 10/4  INR is therapeutic today at 2.9. CBC stable on 10/4. No bleeding noted.  Goal of Therapy:  INR 2-3   Plan: -Warfarin 3 mg PO x 1 -Daily INR, CBC every 3 days    Thank you for allowing pharmacy to be a part of this patient's  care.   Signe Colt, PharmD 04/16/2023 10:16 AM  **Pharmacist phone directory can be found on [email or web address] listed under [name]**

## 2023-04-16 NOTE — Progress Notes (Signed)
Occupational Therapy Session Note  Patient Details  Name: Dale Mills MRN: [id number] Date of Birth: July 15, 1965  {CHL IP REHAB OT TIME CALCULATIONS:[id number]}   Short Term Goals: Week 1:  OT Short Term Goal 1 (Week 1): Pt will be able to recall sternal precautions during self care tasks 3/5 sessions with minimal cueing. OT Short Term Goal 2 (Week 1): Pt will complete LB dressing with Mod A utilizing AE as needed. OT Short Term Goal 3 (Week 1): Pt will completed LB bathing with Min A utilizing AE as needed.  Skilled Therapeutic Interventions/Progress Updates:    Patient agreeable to participate in OT session. Reports *** pain level.   Patient participated in skilled OT session focusing on ***. Therapist facilitated/assessed/developed/educated/integrated/elicited *** in order to improve/facilitate/promote    Therapy Documentation Precautions:  Precautions Precautions: Sternal, Fall Precaution Comments: Verbal education provided during eval. Pt able to verbalize he "doesn't need to open up the incision w/ movement" Restrictions Weight Bearing Restrictions: Yes (Sternal precautions) RUE Weight Bearing: Weight bearing as tolerated LUE Weight Bearing: Weight bearing as tolerated Other Position/Activity Restrictions: sternal precaution   Therapy/Group: Individual Therapy  Limmie Patricia, OTR/L,CBIS  Supplemental OT - [facility] and [facility] Secure Chat Preferred   04/16/2023, 9:42 PM

## 2023-04-16 NOTE — Progress Notes (Addendum)
PROGRESS NOTE   Subjective/Complaints: Pt c/o breathing difficulties this morning when lying in bed (was at 20 degrees). Pt felt much better when moved up to 30 and when up in w/c.   Review of Systems  Constitutional:  Negative for chills, fever and malaise/fatigue.  Eyes:  Positive for blurred vision.  Respiratory:  Positive for shortness of breath.   Cardiovascular:  Negative for chest pain.  Gastrointestinal:  Negative for diarrhea.  Genitourinary: Negative.   Musculoskeletal:  Positive for joint pain.  Skin:  Negative for rash.  Neurological:  Positive for weakness.  Psychiatric/Behavioral:  The patient has insomnia.     Objective:   ECHOCARDIOGRAM COMPLETE  Result Date: 04/14/2023    ECHOCARDIOGRAM REPORT   Patient Name:   Dale Mills Date of Exam: 04/14/2023 Medical Rec #:  [id number]      Height:       72.0 in Accession #:    [id number]     Weight:       242.7 lb Date of Birth:  January 31, 1966      BSA:          2.313 m Patient Age:    57 years       BP:           112/70 mmHg Patient Gender: M              HR:           80 bpm. Exam Location:  Inpatient Procedure: 2D Echo, Color Doppler and Cardiac Doppler Indications:    Dyspnea  History:        Patient has prior history of Echocardiogram examinations, most                 recent 03/13/2023. CAD, AVR and root replacement (Edwards Konect                 Resilia 27mm) and MVR (Medtronic 33mm) 03/31/23,                 Signs/Symptoms:Bacteremia; Risk Factors:Dyslipidemia and                 Hypertension.                 Aortic Valve: 27 mm Edwards valve is present in the aortic                 position. Procedure Date: 03/31/2023.                 Mitral Valve: 33 mm Medtronic valve is present in the mitral                 position. Procedure Date: 03/31/2023.  Sonographer:    Milbert Coulter Referring Phys: Jodelle Red IMPRESSIONS  1. Left ventricular ejection fraction, by  estimation, is 60 to 65%. The left ventricle has normal function. The left ventricle has no regional wall motion abnormalities. There is severe concentric left ventricular hypertrophy. Left ventricular diastolic  parameters are indeterminate.  2. Right ventricular systolic function is normal. The right ventricular size is normal.  3. Left atrial size was mildly dilated.  4. Right atrial  size was moderately dilated.  5. Trivial mitral valve regurgitation. No evidence of mitral stenosis. There is a 33 mm Medtronic present in the mitral position. Procedure Date: 03/31/2023.  6. Right ventricle does not relax completely suggesting intrapercardial pressure. Large pericardial effusion. The pericardial effusion is circumferential. There is no evidence of cardiac tamponade.  7. Well-seated bioprosthetic aortic valve. Aortic valve regurgitation is not visualized. No aortic stenosis is present. There is a 27 mm Edwards valve present in the aortic position. Procedure Date: 03/31/2023. Aortic valve mean gradient measures 10.0 mmHg. Aortic valve Vmax measures 2.21 m/s.  8. The inferior vena cava is normal in size with greater than 50% respiratory variability, suggesting right atrial pressure of 3 mmHg. FINDINGS  Left Ventricle: Left ventricular ejection fraction, by estimation, is 60 to 65%. The left ventricle has normal function. The left ventricle has no regional wall motion abnormalities. The left ventricular internal cavity size was normal in size. There is  severe concentric left ventricular hypertrophy. Left ventricular diastolic parameters are indeterminate. Right Ventricle: The right ventricular size is normal. No increase in right ventricular wall thickness. Right ventricular systolic function is normal. Left Atrium: Left atrial size was mildly dilated. Right Atrium: Right atrial size was moderately dilated. Pericardium: Right ventricle does not relax completely suggesting intrapercardial pressure. A large pericardial  effusion is present. The pericardial effusion is circumferential. There is no evidence of cardiac tamponade. Mitral Valve: Well-seated bioprosthetic mitral valve. Trivial mitral valve regurgitation. There is a 33 mm Medtronic present in the mitral position. Procedure Date: 03/31/2023. No evidence of mitral valve stenosis. MV peak gradient, 14.4 mmHg. The mean mitral valve gradient is 7.5 mmHg. Tricuspid Valve: The tricuspid valve is normal in structure. Tricuspid valve regurgitation is not demonstrated. No evidence of tricuspid stenosis. Aortic Valve: Well-seated bioprosthetic aortic valve. Aortic valve regurgitation is not visualized. No aortic stenosis is present. Aortic valve mean gradient measures 10.0 mmHg. Aortic valve peak gradient measures 19.5 mmHg. Aortic valve area, by VTI measures 3.67 cm. There is a 27 mm Edwards valve present in the aortic position. Procedure Date: 03/31/2023. Pulmonic Valve: The pulmonic valve was normal in structure. Pulmonic valve regurgitation is not visualized. No evidence of pulmonic stenosis. Aorta: The aortic root is normal in size and structure. Venous: The inferior vena cava is normal in size with greater than 50% respiratory variability, suggesting right atrial pressure of 3 mmHg. IAS/Shunts: No atrial level shunt detected by color flow Doppler.  LEFT VENTRICLE PLAX 2D LVIDd:         4.60 cm   Diastology LVIDs:         2.90 cm   LV e' medial:    7.72 cm/s LV PW:         1.70 cm   LV E/e' medial:  17.6 LV IVS:        1.60 cm   LV e' lateral:   6.74 cm/s LVOT diam:     2.30 cm   LV E/e' lateral: 20.2 LV SV:         125 LV SV Index:   54 LVOT Area:     4.15 cm  RIGHT VENTRICLE RV Basal diam:  3.70 cm RV Mid diam:    2.80 cm RV S prime:     10.40 cm/s TAPSE (M-mode): 1.6 cm LEFT ATRIUM              Index        RIGHT ATRIUM  Index LA diam:        4.00 cm  1.73 cm/m   RA Area:     22.70 cm LA Vol (A2C):   118.0 ml 51.01 ml/m  RA Volume:   70.60 ml  30.52 ml/m LA Vol  (A4C):   57.9 ml  25.03 ml/m LA Biplane Vol: 86.7 ml  37.48 ml/m  AORTIC VALVE AV Area (Vmax):    3.38 cm AV Area (Vmean):   3.39 cm AV Area (VTI):     3.67 cm AV Vmax:           221.00 cm/s AV Vmean:          147.000 cm/s AV VTI:            0.340 m AV Peak Grad:      19.5 mmHg AV Mean Grad:      10.0 mmHg LVOT Vmax:         180.00 cm/s LVOT Vmean:        120.000 cm/s LVOT VTI:          0.300 m LVOT/AV VTI ratio: 0.88  AORTA Ao Root diam: 3.50 cm Ao Asc diam:  3.20 cm MITRAL VALVE MV Area (PHT): 3.60 cm     SHUNTS MV Area VTI:   2.14 cm     Systemic VTI:  0.30 m MV Peak grad:  14.4 mmHg    Systemic Diam: 2.30 cm MV Mean grad:  7.5 mmHg MV Vmax:       1.90 m/s MV Vmean:      133.0 cm/s MV Decel Time: 211 msec MV E velocity: 136.00 cm/s MV A velocity: 128.00 cm/s MV E/A ratio:  1.06 Kardie Tobb DO Electronically signed by Thomasene Ripple DO Signature Date/Time: 04/14/2023/4:45:43 PM    Final    Recent Labs    04/14/23 0336  WBC 7.2  HGB 7.6*  HCT 25.5*  PLT 364   No results for input(s): "NA", "K", "CL", "CO2", "GLUCOSE", "BUN", "CREATININE", "CALCIUM" in the last 72 hours.   Intake/Output Summary (Last 24 hours) at 04/16/2023 0847 Last data filed at 04/16/2023 0813 Gross per 24 hour  Intake 3677.44 ml  Output 2875 ml  Net 802.44 ml        Physical Exam: Vital Signs Blood pressure (!) 140/81, pulse 86, temperature 97.9 F (36.6 C), temperature source Oral, resp. rate 14, height 6' (1.829 m), weight (S) 107.5 kg, SpO2 98%.  Constitutional: No distress . Vital signs reviewed. HEENT: NCAT, EOMI, oral membranes moist Neck: supple Cardiovascular: RRR with systolic murmur. No JVD    Respiratory/Chest: CTA Bilaterally without wheezes or rales. Normal effort    GI/Abdomen: BS +, non-tender, non-distended Ext: no clubbing, cyanosis, or edema Psych: pleasant and cooperative  Skin: No evidence of breakdown, no evidence of rash, chest incision CDI Neurologic: Cranial nerves II through XII  intact, motor strength is 5/5 in bilateral deltoid, bicep, tricep, grip, 4/5 hip flexor, knee extensors, ankle dorsiflexor and plantar flexor Neurological exam stable today 04/16/2023  Left visual field cut with neglect partially compensated Musculoskeletal: Full range of motion in all 4 extremities. No joint swelling    Assessment/Plan: 1. Functional deficits which require 3+ hours per day of interdisciplinary therapy in a comprehensive inpatient rehab setting. Physiatrist is providing close team supervision and 24 hour management of active medical problems listed below. Physiatrist and rehab team continue to assess barriers to discharge/monitor patient progress toward functional and medical goals  Care Tool:  Bathing  Body parts bathed by patient: Left arm, Chest, Right arm, Abdomen, Front perineal area, Buttocks, Face   Body parts bathed by helper: Right lower leg, Left lower leg, Left upper leg, Right upper leg     Bathing assist Assist Level: Moderate Assistance - Patient 50 - 74%     Upper Body Dressing/Undressing Upper body dressing   What is the patient wearing?: Hospital gown only    Upper body assist Assist Level: Moderate Assistance - Patient 50 - 74%    Lower Body Dressing/Undressing Lower body dressing      What is the patient wearing?: Pants, Underwear/pull up     Lower body assist Assist for lower body dressing: Maximal Assistance - Patient 25 - 49%     Toileting Toileting    Toileting assist Assist for toileting: Maximal Assistance - Patient 25 - 49%     Transfers Chair/bed transfer  Transfers assist     Chair/bed transfer assist level: Contact Guard/Touching assist     Locomotion Ambulation   Ambulation assist      Assist level: Contact Guard/Touching assist Assistive device: Walker-rolling Max distance: 250ft   Walk 10 feet activity   Assist     Assist level: Contact Guard/Touching assist Assistive device: Walker-rolling    Walk 50 feet activity   Assist Walk 50 feet with 2 turns activity did not occur: Safety/medical concerns  Assist level: Contact Guard/Touching assist Assistive device: Walker-rolling    Walk 150 feet activity   Assist Walk 150 feet activity did not occur: Safety/medical concerns  Assist level: Contact Guard/Touching assist Assistive device: Walker-rolling    Walk 10 feet on uneven surface  activity   Assist Walk 10 feet on uneven surfaces activity did not occur: Safety/medical concerns         Wheelchair     Assist Is the patient using a wheelchair?: Yes Type of Wheelchair: Manual    Wheelchair assist level: Dependent - Patient 0%      Wheelchair 50 feet with 2 turns activity    Assist        Assist Level: Dependent - Patient 0%   Wheelchair 150 feet activity     Assist      Assist Level: Dependent - Patient 0%   Blood pressure (!) 140/81, pulse 86, temperature 97.9 F (36.6 C), temperature source Oral, resp. rate 14, height 6' (1.829 m), weight (S) 107.5 kg, SpO2 98%.   Medical Problem List and Plan: 1. 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             -patient may  shower             -ELOS/Goals: 10/15, PT/OT/SLP Sup  -Continue CIR therapies including PT, OT, and SLP  2.  L-internal jugular DVT/ A fib/Antithrombotics: -DVT/anticoagulation:  Pharmaceutical:LUE cephalic and internal jugular DVT Coumadin and Heparin bridge to change to lovenox at discharge             -antiplatelet therapy: ASA 3. Pain Management:  oxycodone or tramadol prn for pain.  4. Mood/Behavior/Sleep: LCSW to follow for evaluation and support.               -antipsychotic agents: N/A             --  Order melatonin prn for insomnia.  Scheduled trazodone nightly   -reports reasonable sleep 5. Neuropsych/cognition: This patient is capable of making decisions on his own behalf. 6. Skin/Wound Care: Routine pressure relief measures.  7. Fluids/Electrolytes/Nutrition: Strict I/O. Check CMET in am.  8. 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 Fluid overload/SOB: Lasix 40 mg decreased to daily on 09/25 --developed cough last nigh with SOB 09/27 pm.  --daily weights with monitoring for signs of overload. .  -wt stable, monitor Edema in legs will give IV lasix x 1 today.  CXR unchanged, cards to f/u Wts are stable and actually decreased today 10/5   Filed Weights   04/14/23 0500 04/15/23 0436 04/16/23 0441  Weight: 110.1 kg 109.9 kg (S) 107.5 kg  Repeat IV lasix this am 10/4-appreciate cardiology consult  10/5 echo demonstrates large pericardial effusion, likely post-op -card recommended followng CRP and ESR--both are elevated. If they trend up then potentially colchicine rx.    -*iv lasix daily thru Monday then convert to torsemide 10/8 per cards   -*f/u crp and esr ordered for Monday   -no complaints today   -bp's holding 10/6 SOB this morning but at 20 degrees. May have had some anxiety too  -sounded great this morning on exam  -not completely surprised by sx given his pericardial effusion  -weight is down   -continue with current plan as above  10. A fib: Continue on amiodarone 400 mg bid since 09/25-->taper? May need to contact cards for this  11. Pre diabetes/Stress induced hyperglycemia: Monitor BS ac/hs.  --Hgb A1C 6.2 (prediabetic). --diet changed to CM from regular. Continue Levemir 8 units daily with SSI prn for tighter BS control. .  --Transition insulin to metformin as intake improves.   -9/30  decrease levemir to 6 u CBG (last 3)  Recent Labs    04/15/23 1635 04/15/23 2211 04/16/23 0621  GLUCAP 95 140* 102*  10/5 sugars under control  12 ABLA:   Monitor for signs of bleeding             --transfuse prn <7.0 or if symptomatic. Unclear whether fatigue is related to anemia.  Walked further last week with similar Hgb  -9/30 hemoglobin stable at 7.9 yesterday CBC    Component Value Date/Time   WBC 7.2 04/14/2023 0336   RBC 2.83 (L) 04/14/2023 0336   HGB 7.6 (L) 04/14/2023 0336   HCT 25.5 (L) 04/14/2023 0336   PLT 364 04/14/2023 0336   MCV 90.1 04/14/2023 0336   MCH 26.9 04/14/2023 0336   MCHC 29.8 (L) 04/14/2023 0336   RDW 17.7 (H) 04/14/2023 0336   10/6 hgb 7.6 10/4--f/u Monday--suspect at least part of fatigue is related to this    13. Constipation: Resolved and now w/diarrhea. Will hold laxative.    -9/30 frequent BM not liquid, continue to hold laxatives  14. Hyponatremia,mild  -Recheck Thursday  15.  Hypo K+   -KCL currently daily repeat BMET on Monday or as per cardiology   LOS: 7 days A FACE TO FACE EVALUATION WAS PERFORMED  Ranelle Oyster 04/16/2023, 8:47 AM

## 2023-04-16 NOTE — Progress Notes (Signed)
Physical Therapy Session Note  Patient Details  Name: PARAM CAPRI MRN: [id number] Date of Birth: Nov 12, 1965  Today's Date: 04/16/2023 PT Individual Time: 0800-0914 PT Individual Time Calculation (min): 74 min   Short Term Goals: Week 1:  PT Short Term Goal 1 (Week 1): Pt will STS CGA w/ LRAD PT Short Term Goal 2 (Week 1): Pt will ambulate 50 ft modA w/ LRAD PT Short Term Goal 3 (Week 1): Pt will navigate ramp modA w/ LRAD PT Short Term Goal 4 (Week 1): Pt w/ perform bed to chair transfer w/ minA using LRAD PT Short Term Goal 5 (Week 1): Pt will require minA for bed mobility  Skilled Therapeutic Interventions/Progress Updates:  Pt was seen bedside in the am, finishing breakfast up in w/c. Donned B thigh TED hoses. Pt transported to rehab gym. Pt performed all sit to stand and stand pivot transfers with contact guard assist and verbal cues. Pt ambulated 25 feet x 2 and 50 feet x 3 with rolling walker and contact guard with verbal cues. Pt performed step taps and alternating step taps 3 sets x 10 reps each. Monitored vitals throughout treatment ranged from 100-120s/70-80s. After treatment pt returned to room and left sitting up in w/c with chair alarm and call bell within reach.   Therapy Documentation Precautions:  Precautions Precautions: Sternal, Fall Precaution Comments: Verbal education provided during eval. Pt able to verbalize he "doesn't need to open up the incision w/ movement" Restrictions Weight Bearing Restrictions: Yes (Sternal precautions) RUE Weight Bearing: Weight bearing as tolerated LUE Weight Bearing: Weight bearing as tolerated Other Position/Activity Restrictions: sternal precaution General:   Pain: Pain Assessment Pain Scale: 0-10 Pain Score: 0-No pain  Therapy/Group: Individual Therapy  Rayford Halsted 04/16/2023, 11:08 AM

## 2023-04-16 NOTE — Progress Notes (Signed)
  PHARMACY CONSULT NOTE FOR:  OUTPATIENT  PARENTERAL ANTIBIOTIC THERAPY (OPAT)  Indication:  Enterococcus faecalis endocarditis  Regimen: Ampicillin IV 12g Q24H as a continuous infusion, Ceftriaxone IV 2g Q12H End date: 05/12/2023  IV antibiotic discharge orders are pended. To discharging provider:  please sign these orders via discharge navigator,  Select New Orders & click on the button choice - Manage This Unsigned Work.      Thank you for allowing pharmacy to be a part of this patient's care.   Signe Colt, PharmD 04/16/2023 10:22 AM  **Pharmacist phone directory can be found on [email or web address] listed under [facility]**

## 2023-04-17 ENCOUNTER — Encounter: Payer: Self-pay | Admitting: Oncology

## 2023-04-17 DIAGNOSIS — E876 Hypokalemia: Secondary | ICD-10-CM

## 2023-04-17 DIAGNOSIS — R0602 Shortness of breath: Secondary | ICD-10-CM

## 2023-04-17 DIAGNOSIS — I63532 Cerebral infarction due to unspecified occlusion or stenosis of left posterior cerebral artery: Secondary | ICD-10-CM

## 2023-04-17 LAB — CBC
HCT: 27.9 % — ABNORMAL LOW (ref 39.0–52.0)
Hemoglobin: 8.4 g/dL — ABNORMAL LOW (ref 13.0–17.0)
MCH: 27.4 pg (ref 26.0–34.0)
MCHC: 30.1 g/dL (ref 30.0–36.0)
MCV: 90.9 fL (ref 80.0–100.0)
Platelets: 391 10*3/uL (ref 150–400)
RBC: 3.07 MIL/uL — ABNORMAL LOW (ref 4.22–5.81)
RDW: 17.4 % — ABNORMAL HIGH (ref 11.5–15.5)
WBC: 7.5 10*3/uL (ref 4.0–10.5)
nRBC: 0 % (ref 0.0–0.2)

## 2023-04-17 LAB — BASIC METABOLIC PANEL
Anion gap: 9 (ref 5–15)
BUN: 10 mg/dL (ref 6–20)
CO2: 25 mmol/L (ref 22–32)
Calcium: 8.9 mg/dL (ref 8.9–10.3)
Chloride: 103 mmol/L (ref 98–111)
Creatinine, Ser: 0.58 mg/dL — ABNORMAL LOW (ref 0.61–1.24)
GFR, Estimated: >60 mL/min (ref >60)
Glucose, Bld: 97 mg/dL (ref 70–99)
Potassium: 4 mmol/L (ref 3.5–5.1)
Sodium: 137 mmol/L (ref 135–145)

## 2023-04-17 LAB — C-REACTIVE PROTEIN: CRP: 3.4 mg/dL — ABNORMAL HIGH (ref <1.0)

## 2023-04-17 LAB — PROTIME-INR
INR: 2.7 — ABNORMAL HIGH (ref 0.8–1.2)
Prothrombin Time: 29.2 s — ABNORMAL HIGH (ref 11.4–15.2)

## 2023-04-17 LAB — GLUCOSE, CAPILLARY
Glucose-Capillary: 101 mg/dL — ABNORMAL HIGH (ref 70–99)
Glucose-Capillary: 104 mg/dL — ABNORMAL HIGH (ref 70–99)
Glucose-Capillary: 106 mg/dL — ABNORMAL HIGH (ref 70–99)
Glucose-Capillary: 118 mg/dL — ABNORMAL HIGH (ref 70–99)

## 2023-04-17 LAB — SEDIMENTATION RATE: Sed Rate: 61 mm/h — ABNORMAL HIGH (ref 0–16)

## 2023-04-17 MED ORDER — WARFARIN SODIUM 3 MG PO TABS
3.0000 mg | ORAL_TABLET | Freq: Once | ORAL | Status: AC
  Administered 2023-04-17: 3 mg via ORAL
  Filled 2023-04-17: qty 1

## 2023-04-17 MED ORDER — POTASSIUM CHLORIDE CRYS ER 10 MEQ PO TBCR
10.0000 meq | EXTENDED_RELEASE_TABLET | Freq: Every day | ORAL | Status: DC
  Administered 2023-04-18 – 2023-04-26 (×9): 10 meq via ORAL
  Filled 2023-04-17 (×9): qty 1

## 2023-04-17 NOTE — Progress Notes (Signed)
PROGRESS NOTE   Subjective/Complaints: No acute events overnight.  Reports making progress with therapy.  Shortness of breath improved.   Review of Systems  Constitutional:  Negative for chills, fever and malaise/fatigue.  Eyes:  Positive for blurred vision.  Respiratory:  Positive for shortness of breath.   Cardiovascular:  Negative for chest pain.  Gastrointestinal:  Negative for diarrhea, nausea and vomiting.  Genitourinary: Negative.   Musculoskeletal:  Positive for joint pain.  Skin:  Negative for rash.  Neurological:  Positive for weakness.  Psychiatric/Behavioral:  The patient has insomnia.     Objective:   No results found. Recent Labs    04/17/23 0400  WBC 7.5  HGB 8.4*  HCT 27.9*  PLT 391   Recent Labs    04/17/23 0400  NA 137  K 4.0  CL 103  CO2 25  GLUCOSE 97  BUN 10  CREATININE 0.58*  CALCIUM 8.9     Intake/Output Summary (Last 24 hours) at 04/17/2023 1201 Last data filed at 04/17/2023 0800 Gross per 24 hour  Intake 2035.5 ml  Output 2225 ml  Net -189.5 ml        Physical Exam: Vital Signs Blood pressure 111/78, pulse 97, temperature 98.2 F (36.8 C), temperature source Oral, resp. rate 18, height 6' (1.829 m), weight 108.7 kg, SpO2 100%.  Constitutional: No distress . Vital signs reviewed.  Lying in bed HEENT: NCAT, EOMI, oral membranes moist Neck: supple Cardiovascular: RRR with systolic murmur. No JVD    Respiratory/Chest: CTA Bilaterally without wheezes or rales.  Nonlabored breathing GI/Abdomen: BS +, non-tender, non-distended Ext: no clubbing, cyanosis, or edema Psych: pleasant and cooperative  Skin: No evidence of breakdown, no evidence of rash, chest incision CDI Neurologic: Cranial nerves II through XII intact, motor strength is 5/5 in bilateral deltoid, bicep, tricep, grip, 4/5 hip flexor, knee extensors, ankle dorsiflexor and plantar flexor Neurological exam stable  today 04/17/2023  Left visual field cut with neglect partially compensated Musculoskeletal: Full range of motion in all 4 extremities. No joint swelling    Assessment/Plan: 1. Functional deficits which require 3+ hours per day of interdisciplinary therapy in a comprehensive inpatient rehab setting. Physiatrist is providing close team supervision and 24 hour management of active medical problems listed below. Physiatrist and rehab team continue to assess barriers to discharge/monitor patient progress toward functional and medical goals  Care Tool:  Bathing    Body parts bathed by patient: Left arm, Chest, Right arm, Abdomen, Front perineal area, Buttocks, Face   Body parts bathed by helper: Right lower leg, Left lower leg, Left upper leg, Right upper leg     Bathing assist Assist Level: Moderate Assistance - Patient 50 - 74%     Upper Body Dressing/Undressing Upper body dressing   What is the patient wearing?: Hospital gown only    Upper body assist Assist Level: Moderate Assistance - Patient 50 - 74%    Lower Body Dressing/Undressing Lower body dressing      What is the patient wearing?: Pants, Underwear/pull up     Lower body assist Assist for lower body dressing: Maximal Assistance - Patient 25 - 49%     Toileting Toileting  Toileting assist Assist for toileting: Maximal Assistance - Patient 25 - 49%     Transfers Chair/bed transfer  Transfers assist     Chair/bed transfer assist level: Contact Guard/Touching assist     Locomotion Ambulation   Ambulation assist      Assist level: Contact Guard/Touching assist Assistive device: Walker-rolling Max distance: 50   Walk 10 feet activity   Assist     Assist level: Contact Guard/Touching assist Assistive device: Walker-rolling   Walk 50 feet activity   Assist Walk 50 feet with 2 turns activity did not occur: Safety/medical concerns  Assist level: Contact Guard/Touching assist Assistive  device: Walker-rolling    Walk 150 feet activity   Assist Walk 150 feet activity did not occur: Safety/medical concerns  Assist level: Contact Guard/Touching assist Assistive device: Walker-rolling    Walk 10 feet on uneven surface  activity   Assist Walk 10 feet on uneven surfaces activity did not occur: Safety/medical concerns         Wheelchair     Assist Is the patient using a wheelchair?: Yes Type of Wheelchair: Manual    Wheelchair assist level: Dependent - Patient 0%      Wheelchair 50 feet with 2 turns activity    Assist        Assist Level: Dependent - Patient 0%   Wheelchair 150 feet activity     Assist      Assist Level: Dependent - Patient 0%   Blood pressure 111/78, pulse 97, temperature 98.2 F (36.8 C), temperature source Oral, resp. rate 18, height 6' (1.829 m), weight 108.7 kg, SpO2 100%.   Medical Problem List and Plan: 1. 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             -patient may  shower             -ELOS/Goals: 10/15, PT/OT/SLP Sup  -Continue CIR therapies including PT, OT, and SLP  2.  L-internal jugular DVT/ A fib/Antithrombotics: -DVT/anticoagulation:  Pharmaceutical:LUE cephalic and internal jugular DVT Coumadin and Heparin bridge to change to lovenox at discharge             -antiplatelet therapy: ASA 3. Pain Management:  oxycodone or tramadol prn for pain.  4. Mood/Behavior/Sleep: LCSW to follow for evaluation and support.              -antipsychotic agents: N/A             -- Order melatonin prn for insomnia.  Scheduled trazodone nightly   -reports reasonable sleep 5. Neuropsych/cognition: This patient is  capable of making decisions on his own behalf. 6. Skin/Wound Care: Routine pressure relief measures.  7. Fluids/Electrolytes/Nutrition: Strict I/O. Check CMET in am.  8. 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 Fluid overload/SOB: Lasix 40 mg decreased to daily on 09/25 --developed cough last nigh with SOB 09/27 pm.  --daily weights with monitoring for signs of overload. .  -wt stable, monitor Edema in legs will give IV lasix x 1 today.  CXR unchanged, cards to f/u Wts are stable  and actually decreased today 10/5    Filed Weights   04/15/23 0436 04/16/23 0441 04/17/23 0500  Weight: 109.9 kg (S) 107.5 kg 108.7 kg  Repeat IV lasix this am 10/4-appreciate cardiology consult  10/5 echo demonstrates large pericardial effusion, likely post-op -card recommended followng CRP and ESR--both are elevated. If they trend up then potentially colchicine rx.    -*iv lasix daily thru Monday then convert to torsemide 10/8 per cards   -*f/u crp and esr ordered for Monday   -no complaints today   -bp's holding 10/6 SOB this morning but at 20 degrees. May have had some anxiety too  -sounded great this morning on exam  -not completely surprised by sx given his pericardial effusion  -weight is down   -continue with current plan as above  10/7  Weights appear stable and decreased from several days prior,, SOB doing better overall 10. A fib: Continue on amiodarone 400 mg bid since 09/25-->taper? May need to contact cards for this  11. Pre diabetes/Stress induced hyperglycemia: Monitor BS ac/hs.  --Hgb A1C 6.2 (prediabetic). --diet changed to CM from regular. Continue Levemir 8 units daily with SSI prn for tighter BS control. .  --Transition insulin to metformin as intake improves.   -9/30 decrease levemir to 6 u CBG (last 3)  Recent Labs    04/16/23 2025 04/17/23 0630  04/17/23 1141  GLUCAP 112* 101* 104*  10/7 CBGs well controlled  12 ABLA:   Monitor for signs of bleeding             --transfuse prn <7.0 or if symptomatic. Unclear whether fatigue is related to anemia.  Walked further last week with similar Hgb  -9/30 hemoglobin stable at 7.9 yesterday  CBC    Component Value Date/Time   WBC 7.5 04/17/2023 0400   RBC 3.07 (L) 04/17/2023 0400   HGB 8.4 (L) 04/17/2023 0400   HCT 27.9 (L) 04/17/2023 0400   PLT 391 04/17/2023 0400   MCV 90.9 04/17/2023 0400   MCH 27.4 04/17/2023 0400   MCHC 30.1 04/17/2023 0400   RDW 17.4 (H) 04/17/2023 0400   10/6 hgb 7.6 10/4--f/u Monday--suspect at least part of fatigue is related to this  10/7 HGB up to 8.4 today    13. Constipation: Resolved and now w/diarrhea. Will hold laxative.    -9/30 frequent BM not liquid, continue to hold laxatives  14. Hyponatremia,mild  -10/7 Na stable at 137  15.  Hypo K+   -KCL currently daily repeat BMET on Monday or as per cardiology  -10/7 K+ up to 4.0, improved will decrease to daily, recheck thursday  LOS: 8 days A FACE TO FACE EVALUATION WAS PERFORMED  Fanny Dance 04/17/2023, 12:01 PM

## 2023-04-17 NOTE — Plan of Care (Signed)
  Problem: RH Dressing Goal: LTG Patient will perform lower body dressing w/assist (OT) Description: LTG: Patient will perform lower body dressing with assist, with/without cues in positioning using equipment (OT) Flowsheets (Taken 04/17/2023 0758) LTG: Pt will perform lower body dressing with assistance level of: (Goal downgraded based on Pt's CLOF, progress thus far, and ELOS) Contact Guard/Touching assist Note: Goal downgraded based on Pt's CLOF, progress thus far, and ELOS   Problem: RH Toileting Goal: LTG Patient will perform toileting task (3/3 steps) with assistance level (OT) Description: LTG: Patient will perform toileting task (3/3 steps) with assistance level (OT)  Flowsheets (Taken 04/17/2023 0758) LTG: Pt will perform toileting task (3/3 steps) with assistance level: (Goal downgraded based on Pt's CLOF, progress thus far, and ELOS) Contact Guard/Touching assist Note: Goal downgraded based on Pt's CLOF, progress thus far, and ELOS   Problem: RH Toilet Transfers Goal: LTG Patient will perform toilet transfers w/assist (OT) Description: LTG: Patient will perform toilet transfers with assist, with/without cues using equipment (OT) Flowsheets (Taken 04/17/2023 0758) LTG: Pt will perform toilet transfers with assistance level of: (Goal downgraded based on Pt's CLOF, progress thus far, and ELOS) Contact Guard/Touching assist Note: Goal downgraded based on Pt's CLOF, progress thus far, and ELOS

## 2023-04-17 NOTE — Progress Notes (Signed)
Speech Language Pathology Weekly Progress and Session Note  Patient Details  Name: Dale Mills MRN: [id number] Date of Birth: 1965/11/11  Beginning of progress report period: April 10, 2023 End of progress report period: April 17, 2023  Today's Date: 04/17/2023 SLP Individual Time: 1000-1100 SLP Individual Time Calculation (min): 60 min  Short Term Goals: Week 1: SLP Short Term Goal 1 (Week 1): Patient will attend to left visual field during functional tasks with Min verbal cues SLP Short Term Goal 1 - Progress (Week 1): Met SLP Short Term Goal 2 (Week 1): Patient will utilize speech intelligibility strategies at the sentence level to achieve 100% intelligibility with Min verbal cues. SLP Short Term Goal 2 - Progress (Week 1): Met SLP Short Term Goal 3 (Week 1): Patient will demonstrate selective attention to tasks in a mildly distracting enviornment for 15 minutes with Min verbal cues for redirection. SLP Short Term Goal 3 - Progress (Week 1): Met SLP Short Term Goal 4 (Week 1): Patient will recall novel information with 80% acc given min A SLP Short Term Goal 4 - Progress (Week 1): Met SLP Short Term Goal 5 (Week 1): Patient will demonstrate ability to problem solve in mildly complex functional situations with min multimodal A SLP Short Term Goal 5 - Progress (Week 1): Met    New Short Term Goals: Week 2: SLP Short Term Goal 1 (Week 2): Patient will attend to left visual field during functional tasks with Mod I verbal cues SLP Short Term Goal 2 (Week 2): Patient will recall novel information given a 10- 15 minute delay with 90% acc given min A SLP Short Term Goal 3 (Week 2): Patient will complete moderately complex problem solving with 90% acc with sup A SLP Short Term Goal 4 (Week 2): Patient will utilize speech intelligibility strategies at the conversation level to achieve 100% intelligibility with Mod I verbal cues.  Weekly Progress Updates: Patient has made excellent  gains and has met 5 of 5 STG's this reporting period due to improved speech intelligibility, memory, problem solving, and attention. Pt's current speech intelligibility is ~100% in sentences of known context. He has improved his attention in mildly distracting environments and improved attentiveness to the left side. Currently, pt continues to require min A for problem solving, memory, speech intelligibility and l inattention. Pt/ famly education ongoing. Pt would beneift from continued skilled SLP intervention to maximize cogntiion in order to maximize his functional independnece prior to discharge.   Intensity: Minumum of 1-2 x/day, 30 to 90 minutes Frequency: 3 to 5 out of 7 days Duration/Length of Stay: 10/15 Treatment/Interventions: Cognitive remediation/compensation;Internal/external aids;Speech/Language facilitation;Cueing hierarchy;Oral motor exercises;Therapeutic Activities;Patient/family education;Multimodal communication approach;Functional tasks;Therapeutic Exercise   Daily Session  Skilled Therapeutic Interventions: Pt was seen in am to address cognitive re- training. Pt was alert and seated upright in WC upon SLP arrival. Pt denied pain and was agreeable for session. SLP challenged pt in recall, attention, and speech intelligibility. SLP reviewed WRAP compensatory strategies and examples of utilization. Given a moderate level paragraph presented verbally, SLP guided pt in repetition and association strategies. He recalled information given a delay with 80% acc with min A. SLP challenged pt in structure selective attention task. With room door left open pt challenged to participate in task where he attended for 14 minutes with sup A. Pt attended to left side with mod I and additional time. In final minutes of session SLP addressed speech intelligibility. SLP reviewed strategies of over articulation and separating words.  During a guided task, pt was ~100% intelligible with short simple phrases  and mod I. At conclusion of session pt was left seated upright in [facility] with call button within reach and chair alarm active. SLP to continue POC.     Pain Pain Assessment Pain Scale: 0-10 Pain Score: 0-No pain  Therapy/Group: Individual Therapy  Renaee Munda 04/17/2023, 4:05 PM

## 2023-04-17 NOTE — NC FL2 (Signed)
[place] MEDICAID FL2 LEVEL OF CARE FORM     IDENTIFICATION  Patient Name: Dale Mills Birthdate: Mar 05, 1966 Sex: male Admission Date (Current Location): 04/09/2023  [place] and [place] Number:  Haynes Bast [id number] M Facility and Address:         Provider Number:    Attending Physician Name and Address:  Erick Colace, MD  Relative Name and Phone Number:  Lenny Pastel [phone]    Current Level of Care: Hospital Recommended Level of Care: Skilled Nursing Facility, Assisted Living Facility Prior Approval Number:    Date Approved/Denied:   PASRR Number:    Discharge Plan: ICF (SNF/ALF)    Current Diagnoses: Patient Active Problem List   Diagnosis Date Noted   Pericardial effusion after operative procedure 04/14/2023   Shortness of breath 04/14/2023   Bilateral lower extremity edema 04/14/2023   Debility 04/10/2023   Acute ischemic left posterior cerebral artery (PCA) stroke (HCC) 04/09/2023   S/P aortic valve replacement and aortoplasty 03/31/2023   Protein-calorie malnutrition, severe 03/30/2023   Acute respiratory failure (HCC) 03/29/2023   Septic shock (HCC) 03/29/2023   Enterococcal bacteremia 03/29/2023   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   Iron deficiency anemia 03/20/2023   Coronary artery disease coronary CT angio did not show any major issues, suspicion for distal disease 06/12/2020   Angina pectoris (HCC) Canadian classification 2 04/10/2020   Precordial chest pain 02/13/2020   Essential hypertension 02/13/2020   Dyslipidemia 02/13/2020    Orientation RESPIRATION BLADDER Height & Weight     Self, Time, Situation, Place  Normal Continent (episodes of inct) Weight: 239 lb 10.2 oz (108.7 kg) Height:  6' (182.9 cm)  BEHAVIORAL SYMPTOMS/MOOD NEUROLOGICAL BOWEL NUTRITION STATUS      Continent Diet  AMBULATORY STATUS COMMUNICATION OF NEEDS Skin   Extensive Assist Verbally Other (Comment) (midline incison, OTA)                        Personal Care Assistance Level of Assistance  Bathing, Dressing Bathing Assistance: Limited assistance   Dressing Assistance: Limited assistance     Functional Limitations Info             SPECIAL CARE FACTORS FREQUENCY  PT (By licensed PT), OT (By licensed OT), Speech therapy     PT Frequency: 5x a week OT Frequency: 5x a week     Speech Therapy Frequency: 5x a week      Contractures      Additional Factors Info  Code Status Code Status Info: FULL             Current Medications (04/17/2023):  This is the current hospital active medication list Current Facility-Administered Medications  Medication Dose Route Frequency Provider Last Rate Last Admin   (feeding supplement) PROSource Plus liquid 30 mL  30 mL Oral BID BM Love, Pamela S, PA-C   30 mL at 04/17/23 1231   acetaminophen (TYLENOL) tablet 325-650 mg  325-650 mg Oral Q4H PRN Love, Pamela S, PA-C       alum & mag hydroxide-simeth (MAALOX/MYLANTA) 200-200-20 MG/5ML suspension 30 mL  30 mL Oral Q4H PRN Love, Pamela S, PA-C       amiodarone (PACERONE) tablet 200 mg  200 mg Oral Daily Kirsteins, Victorino Sparrow, MD   200 mg at 04/17/23 0734   ampicillin (OMNIPEN) 2 g in sodium chloride 0.9 % 100 mL IVPB  2 g Intravenous Q4H Erick Colace, MD 300 mL/hr at 04/17/23 1230 2 g at  04/17/23 1230   ascorbic acid (VITAMIN C) tablet 500 mg  500 mg Oral Daily Jacquelynn Cree, PA-C   500 mg at 04/17/23 8841   aspirin EC tablet 81 mg  81 mg Oral Daily Jacquelynn Cree, PA-C   81 mg at 04/17/23 0734   atorvastatin (LIPITOR) tablet 80 mg  80 mg Oral Daily Jacquelynn Cree, PA-C   80 mg at 04/17/23 6606   bisacodyl (DULCOLAX) suppository 10 mg  10 mg Rectal Daily PRN Love, Pamela S, PA-C       cefTRIAXone (ROCEPHIN) 2 g in sodium chloride 0.9 % 100 mL IVPB  2 g Intravenous Q12H Erick Colace, MD 200 mL/hr at 04/17/23 0811 2 g at 04/17/23 3016   Chlorhexidine Gluconate Cloth 2 % PADS 6 each  6 each Topical Q12H Erick Colace,  MD   6 each at 04/17/23 0109   diphenhydrAMINE (BENADRYL) capsule 25 mg  25 mg Oral Q6H PRN Love, Pamela S, PA-C       ezetimibe (ZETIA) tablet 10 mg  10 mg Oral Daily Jacquelynn Cree, PA-C   10 mg at 04/17/23 0735   Fe Fum-Vit C-Vit B12-FA (TRIGELS-F FORTE) capsule 1 capsule  1 capsule Oral QPC breakfast Jacquelynn Cree, PA-C   1 capsule at 04/17/23 0734   guaiFENesin-dextromethorphan (ROBITUSSIN DM) 100-10 MG/5ML syrup 5-10 mL  5-10 mL Oral Q6H PRN Jacquelynn Cree, PA-C   10 mL at 04/12/23 0323   insulin aspart (novoLOG) injection 2-6 Units  2-6 Units Subcutaneous TID AC & HS Jacquelynn Cree, PA-C   2 Units at 04/15/23 2216   insulin detemir (LEVEMIR) injection 6 Units  6 Units Subcutaneous Daily Fanny Dance, MD   6 Units at 04/17/23 3235   melatonin tablet 3 mg  3 mg Oral QHS PRN Fanny Dance, MD       metoprolol tartrate (LOPRESSOR) tablet 12.5 mg  12.5 mg Oral BID Love, Pamela S, PA-C   12.5 mg at 04/17/23 5732   nutrition supplement (JUVEN) (JUVEN) powder packet 1 packet  1 packet Oral BID BM Jacquelynn Cree, PA-C   1 packet at 04/17/23 1230   Oral care mouth rinse  15 mL Mouth Rinse 4 times per day Love, Pamela S, PA-C   15 mL at 04/17/23 1231   oxyCODONE (Oxy IR/ROXICODONE) immediate release tablet 5-10 mg  5-10 mg Oral Q3H PRN Love, Pamela S, PA-C       pantoprazole (PROTONIX) EC tablet 40 mg  40 mg Oral Daily Jacquelynn Cree, PA-C   40 mg at 04/17/23 0734   [START ON 04/18/2023] potassium chloride (KLOR-CON M) CR tablet 10 mEq  10 mEq Oral Daily Fanny Dance, MD       prochlorperazine (COMPAZINE) tablet 5-10 mg  5-10 mg Oral Q6H PRN Love, Pamela S, PA-C       Or   prochlorperazine (COMPAZINE) suppository 12.5 mg  12.5 mg Rectal Q6H PRN Love, Pamela S, PA-C       Or   prochlorperazine (COMPAZINE) injection 5-10 mg  5-10 mg Intravenous Q6H PRN Love, Pamela S, PA-C   5 mg at 04/10/23 1026   protein supplement (ENSURE MAX) liquid  11 oz Oral BID Jacquelynn Cree, PA-C   11 oz at  04/17/23 0751   sodium chloride flush (NS) 0.9 % injection 10-40 mL  10-40 mL Intracatheter Q12H Kirsteins, Victorino Sparrow, MD   10 mL at 04/17/23 0750   sodium chloride flush (  NS) 0.9 % injection 10-40 mL  10-40 mL Intracatheter PRN Kirsteins, Victorino Sparrow, MD       sodium phosphate (FLEET) enema 1 enema  1 enema Rectal Once PRN Love, Pamela S, PA-C       tamsulosin (FLOMAX) capsule 0.4 mg  0.4 mg Oral QPC supper Erick Colace, MD   0.4 mg at 04/16/23 1751   [START ON 04/18/2023] torsemide (DEMADEX) tablet 20 mg  20 mg Oral Daily Jodelle Red, MD       traMADol Janean Sark) tablet 50-100 mg  50-100 mg Oral Q4H PRN Love, Pamela S, PA-C       traZODone (DESYREL) tablet 50 mg  50 mg Oral QHS Fanny Dance, MD   50 mg at 04/16/23 2156   warfarin (COUMADIN) tablet 3 mg  3 mg Oral ONCE-1600 Kirsteins, Victorino Sparrow, MD       Warfarin - Pharmacist Dosing Inpatient   Does not apply [id number] Jerene Pitch   Given at 04/16/23 1505     Discharge Medications: Please see discharge summary for a list of discharge medications.  Relevant Imaging Results:  Relevant Lab Results:   Additional Information SS: [id number]  Andria Rhein

## 2023-04-17 NOTE — Progress Notes (Signed)
Occupational Therapy Weekly Progress Note  Patient Details  Name: JEVAUGHN DEGOLLADO MRN: [id number] Date of Birth: [birth year]  Beginning of progress report period: April 10, 2023 End of progress report period: April 17, 2023  Today's Date: 04/17/2023 OT Individual Time: 4098-1191 OT Individual Time Calculation (min): 44 min    Patient has met 3 of 3 short term goals.  [name] is making sow progress towards reaching his LTGs. He is able to complete UB dressing with supervision while seated, LB dressing with mod A, U/LB bathing min A using AD, and toileting min A progressing towards CGA. He is completing ambulatory transfers to the toilet using his RW with CGA to min A for balance and RW management. [name] continues to demonstrate most significant deficits in his functional cognition, activity tolerance, and decreased attention to the L.   Patient continues to demonstrate the following deficits: muscle weakness, decreased cardiorespiratoy endurance, decreased coordination and decreased motor planning, hemianopsia, decreased attention to left, decreased initiation, decreased attention, decreased awareness, decreased problem solving, decreased safety awareness, decreased memory, and delayed processing, central origin, and decreased sitting balance, decreased standing balance, decreased postural control, and decreased balance strategies and therefore will continue to benefit from skilled OT intervention to enhance overall performance with BADL and Reduce care partner burden.  Patient progressing toward long term goals..  Plan of care revisions: See Plan of care note for details. LB dressing, toileting, and toilet transfer goals downgraded based on Pt's CLOF, progress thus far, and ELOS.  OT Short Term Goals Week 1:  OT Short Term Goal 1 (Week 1): Pt will be able to recall sternal precautions during self care tasks 3/5 sessions with minimal cueing. OT Short Term Goal 1 - Progress (Week 1):  Met OT Short Term Goal 2 (Week 1): Pt will complete LB dressing with Mod A utilizing AE as needed. OT Short Term Goal 2 - Progress (Week 1): Met OT Short Term Goal 3 (Week 1): Pt will completed LB bathing with Min A utilizing AE as needed. OT Short Term Goal 3 - Progress (Week 1): Met Week 2:  OT Short Term Goal 1 (Week 2): STG=LTGs d/t Pt ELOS  Skilled Therapeutic Interventions/Progress Updates:     Pt received sitting up in wc dressed and ready for the day with thigh high TEDs donned. Pt presenting to be in good spirits receptive to skilled OT session reporting 0/10 pain- OT offering intermittent rest breaks, repositioning, and therapeutic support to optimize participation in therapy session. Pt requesting BR upon OT arrival. Engaged pt in completing functional mobility to BR using RW with pt able to recall correct hand placement to maintain sternal precautions prior to sit<>stand with light min A required to power up to standing positioned. Pt completed functional mobility using RW with CGA and maintained standing balance CGA while voiding in toilet. Pt completed functional mobility back to room and tolerated standing to wash hands and groom hair with CGA. Seated rest break provided following. Transported Pt to therapy gym total A in wc for time management and energy conservation. Pt completed short distance functional mobility from wc>NuStep using RW with CGA. Engaged pt in completing LB endurance/strength training on NuStep using B LEs only to increase overall activity tolerance for BADLs. Monitored Pt's vitals throughout NuStep training and utilized Borg Perceived Exertion Scale to monitor Pt's exertion levels. Pt able to tolerate complete 6 minutes on level 2 setting and 8 minutes on level 4 setting maintaining a step rate >35 steps/minute  with prolonged rest break provided following.   Vitals seated prior to activity: BP 110/86(95) HR 100 PSO2 98%  Vitals end of activity: BP 104/81(88) HR 92  PSO2 100%  Borg Percieved Exertion Scale:  Min 2: 9 Very light Min 6: 9 Very light Min 10: 13 Somewhat hard Min 15: 15 hard  Pt presenting with improved motivation and increased activity tolerance this session. Stand step NuStep>wc using RW CGA. Transported Pt back to room total A in wc. Pt was left resting in wc with call bell in reach, seat belt alarm on, and all needs met.    Therapy Documentation Precautions:  Precautions Precautions: Sternal, Fall Precaution Comments: Verbal education provided during eval. Pt able to verbalize he "doesn't need to open up the incision w/ movement" Restrictions Weight Bearing Restrictions: Yes (Sternal precautions) RUE Weight Bearing: Weight bearing as tolerated LUE Weight Bearing: Weight bearing as tolerated Other Position/Activity Restrictions: sternal precaution  Therapy/Group: Individual Therapy  Clide Deutscher 04/17/2023, 7:51 AM

## 2023-04-17 NOTE — Progress Notes (Signed)
Physical Therapy Session Note  Patient Details  Name: Dale Mills MRN: [id number] Date of Birth: 1965-08-03  Today's Date: 04/17/2023 PT Individual Time: 0802-0832 PT Individual Time Calculation (min): 30 min   Short Term Goals: Week 1:  PT Short Term Goal 1 (Week 1): Pt will STS CGA w/ LRAD PT Short Term Goal 2 (Week 1): Pt will ambulate 50 ft modA w/ LRAD PT Short Term Goal 3 (Week 1): Pt will navigate ramp modA w/ LRAD PT Short Term Goal 4 (Week 1): Pt w/ perform bed to chair transfer w/ minA using LRAD PT Short Term Goal 5 (Week 1): Pt will require minA for bed mobility  Skilled Therapeutic Interventions/Progress Updates:    Pt received in bed agreeable to PT. Pt requested the need to void.  Supine> sitting on EOB supervision w/ HOB elevated. Ted's and shoes applied while sitting on EOB to challenge sitting balance.   STS twice during session: minA w/ multiple attempts from both bed and WC. Pt able to maintain precaution with no verbal cues.  Neuro(dynamic balance): Pt ambulated from bedside > commode CGA using RW while navigating tight room layout & obstacles. Standing for voiding w/ CGA for stability. Pt continent to bladder. Pt continued standing for hand hygiene (CGA) and donning brief (CGA w/ hands on RW). Pt able to pull LE dressing from knees up w/ CGA for stability.  Pt reports the potential desire to have a WC for mobility after D/C. D/C location still unclear ALF vs [facility] home.  Pt left in WC, alarm activated, all needs met.  Therapy Documentation Precautions:  Precautions Precautions: Sternal, Fall Precaution Comments: Verbal education provided during eval. Pt able to verbalize he "doesn't need to open up the incision w/ movement" Restrictions Weight Bearing Restrictions: Yes (Sternal precautions) RUE Weight Bearing: Weight bearing as tolerated LUE Weight Bearing: Weight bearing as tolerated Other Position/Activity Restrictions: sternal precaution Pain: Pt  reports no pain during session.   Therapy/Group: Individual Therapy  [name] 04/17/2023, 7:56 AM

## 2023-04-17 NOTE — Progress Notes (Signed)
PHARMACY - ANTICOAGULATION CONSULT NOTE  Pharmacy Consult for Warfarin Indication: atrial fibrillation  No Known Allergies  Patient Measurements: Height: 6' (182.9 cm) Weight: 108.7 kg (239 lb 10.2 oz) IBW/kg (Calculated) : 77.6  Vital Signs: Temp: 98.2 F (36.8 C) (10/07 0700) Temp Source: Oral (10/07 0700) BP: 111/78 (10/07 0700) Pulse Rate: 97 (10/07 0735)  Labs: Recent Labs    04/15/23 0347 04/16/23 0443 04/17/23 0400  HGB  --   --  8.4*  HCT  --   --  27.9*  PLT  --   --  391  LABPROT 29.4* 30.9* 29.2*  INR 2.8* 2.9* 2.7*  CREATININE  --   --  0.58*    Estimated Creatinine Clearance: 129.7 mL/min (A) (by C-G formula based on SCr of 0.58 mg/dL (L)).   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 Having recurrent Afib postop, pharmacy consulted to start warfarin for Afib on 9/24. On amiodarone and ampicillin which may increase sensitivity to warfarin.  Acute DVT noted in the left internal jugular and a superficial thrombosis of left upper arm per duplex 9/26. Lovenox 40 mg daily 9/22>>9/24 > IV Heparin on 9/26 > therapeutic Lovenox 9/27>>10/1, when INR therapeutic x 3 consecutive days.   INR remains therapeutic (2.7).  Hemoglobin 7.6 on 10/4> 8.4 today. Platelet count normal. Amiodarone 400 mg BID > 200 mg daily on 10/4.  Goal of Therapy:  INR 2-3 Monitor platelets by anticoagulation protocol: Yes   Plan:  Warfarin 3 mg x 1 again today Daily PT/INR CBC on Mondays and Thursdays with bmet.  Dennie Fetters, RPh 04/17/2023,8:27 AM

## 2023-04-17 NOTE — Progress Notes (Signed)
Patient ID: Dale Mills, male   DOB: 1965-12-03, 57 y.o.   MRN: [id number]  Sw informed pt's Medicaid has been  approved. Medicaid ID: [id number] M

## 2023-04-18 ENCOUNTER — Encounter: Payer: Self-pay | Admitting: Oncology

## 2023-04-18 LAB — GLUCOSE, CAPILLARY
Glucose-Capillary: 102 mg/dL — ABNORMAL HIGH (ref 70–99)
Glucose-Capillary: 104 mg/dL — ABNORMAL HIGH (ref 70–99)
Glucose-Capillary: 88 mg/dL (ref 70–99)
Glucose-Capillary: 89 mg/dL (ref 70–99)

## 2023-04-18 LAB — PROTIME-INR
INR: 2.7 — ABNORMAL HIGH (ref 0.8–1.2)
Prothrombin Time: 29 s — ABNORMAL HIGH (ref 11.4–15.2)

## 2023-04-18 MED ORDER — WARFARIN SODIUM 3 MG PO TABS
3.0000 mg | ORAL_TABLET | Freq: Every day | ORAL | Status: AC
  Administered 2023-04-18: 3 mg via ORAL
  Filled 2023-04-18: qty 1

## 2023-04-18 NOTE — Progress Notes (Signed)
Speech Language Pathology Daily Session Note  Patient Details  Name: Dale Mills MRN: [id number] Date of Birth: [birth year]  Today's Date: 04/18/2023 SLP Individual Time: 1445-1530 SLP Individual Time Calculation (min): 45 min  Short Term Goals: Week 2: SLP Short Term Goal 1 (Week 2): Patient will attend to left visual field during functional tasks with Mod I verbal cues SLP Short Term Goal 2 (Week 2): Patient will recall novel information given a 10- 15 minute delay with 90% acc given min A SLP Short Term Goal 3 (Week 2): Patient will complete moderately complex problem solving with 90% acc with sup A SLP Short Term Goal 4 (Week 2): Patient will utilize speech intelligibility strategies at the conversation level to achieve 100% intelligibility with Mod I verbal cues.  Skilled Therapeutic Interventions: Skilled therapy session focused on cognitive goals. SLP facilitated session by prompting patient to recall days therapy activities. Patient able to independently recall in-depth activity completed with OT prior to ST session. SLP addressed problem solving through abstract card game task. SLP reviewed instructions and patient participated in game with modiA requiring one cue to review card placement. Patient independently utilized L to R scanning strategies throughout session and attended to activity for upwards of 30 minutes. Patient continues to make functional progress towards LTG's and therefore POC updated. Patient left in chair with alarm set and call bell in reach. Continue POC.   Pain No pain reported  Therapy/Group: Individual Therapy  Esker Dever M.A., CF-SLP 04/18/2023, 3:44 PM

## 2023-04-18 NOTE — Progress Notes (Signed)
PHARMACY - ANTICOAGULATION CONSULT NOTE  Pharmacy Consult for Warfarin Indication: atrial fibrillation  No Known Allergies  Patient Measurements: Height: 6' (182.9 cm) Weight: 108.7 kg (239 lb 10.2 oz) IBW/kg (Calculated) : 77.6  Vital Signs: Temp: 97.7 F (36.5 C) (10/08 0426) BP: 126/80 (10/08 0426) Pulse Rate: 88 (10/08 0426)  Labs: Recent Labs    04/16/23 0443 04/17/23 0400 04/18/23 0350  HGB  --  8.4*  --   HCT  --  27.9*  --   PLT  --  391  --   LABPROT 30.9* 29.2* 29.0*  INR 2.9* 2.7* 2.7*  CREATININE  --  0.58*  --     Estimated Creatinine Clearance: 129.7 mL/min (A) (by C-G formula based on SCr of 0.58 mg/dL (L)).   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 Having recurrent Afib postop, pharmacy consulted to start warfarin for Afib on 9/24. On amiodarone and ampicillin which may increase sensitivity to warfarin.  Acute DVT noted in the left internal jugular and a superficial thrombosis of left upper arm per duplex 9/26. Lovenox 40 mg daily 9/22>>9/24 > IV Heparin on 9/26 > therapeutic Lovenox 9/27>>10/1, when INR therapeutic x 3 consecutive days.   INR remains therapeutic (2.7). Hemoglobin 7.6 on 10/4> 8.4 on 10/7. Platelet count normal. Amiodarone 400 mg BID > 200 mg daily on 10/4.  Goal of Therapy:  INR 2-3 Monitor platelets by anticoagulation protocol: Yes   Plan:  Warfarin 3 mg daily at 4pm. Daily PT/INR > to Mondays and Thursdays. Next 10/10 CBC on Mondays and Thursdays with bmet.  Dennie Fetters, RPh 04/18/2023,8:00 AM

## 2023-04-18 NOTE — Progress Notes (Signed)
PROGRESS NOTE   Subjective/Complaints: Patient seen in physical therapy today.  Has been on the NuStep for approximately 15 minutes.  His ambulation distance is improving as well.  He has no chest pains or shortness of breath during exercise.  He has no other complaints today.   Review of Systems  Constitutional:  Negative for chills, fever and malaise/fatigue.  Eyes:  Positive for blurred vision.  Respiratory:  Negative for shortness of breath.   Cardiovascular:  Negative for chest pain.  Gastrointestinal:  Negative for diarrhea, nausea and vomiting.  Genitourinary: Negative.   Musculoskeletal:  Positive for joint pain.  Skin:  Negative for rash.  Neurological:  Positive for weakness.  Psychiatric/Behavioral:  The patient has insomnia.     Objective:   No results found. Recent Labs    04/17/23 0400  WBC 7.5  HGB 8.4*  HCT 27.9*  PLT 391   Recent Labs    04/17/23 0400  NA 137  K 4.0  CL 103  CO2 25  GLUCOSE 97  BUN 10  CREATININE 0.58*  CALCIUM 8.9     Intake/Output Summary (Last 24 hours) at 04/18/2023 1028 Last data filed at 04/18/2023 1610 Gross per 24 hour  Intake 1242.88 ml  Output 1750 ml  Net -507.12 ml        Physical Exam: Vital Signs Blood pressure 126/80, pulse 88, temperature 97.7 F (36.5 C), resp. rate 17, height 6' (1.829 m), weight 108.7 kg, SpO2 99%.   General: No acute distress Mood and affect are appropriate Heart: Regular rate and rhythm no rubs murmurs or extra sounds Lungs: Clear to auscultation, breathing unlabored, no rales or wheezes Abdomen: Positive bowel sounds, soft nontender to palpation, nondistended Extremities: No clubbing, cyanosis, or edema on right 1+ edema left   Skin: No evidence of breakdown, no evidence of rash, chest incision CDI Neurologic: Cranial nerves II through XII intact, motor strength is 5/5 in bilateral deltoid, bicep, tricep, grip, 4/5 hip  flexor, knee extensors, ankle dorsiflexor and plantar flexor Neurological exam stable today 04/18/2023  Left visual field cut with neglect partially compensated Musculoskeletal: Full range of motion in all 4 extremities. No joint swelling    Assessment/Plan: 1. Functional deficits which require 3+ hours per day of interdisciplinary therapy in a comprehensive inpatient rehab setting. Physiatrist is providing close team supervision and 24 hour management of active medical problems listed below. Physiatrist and rehab team continue to assess barriers to discharge/monitor patient progress toward functional and medical goals  Care Tool:  Bathing    Body parts bathed by patient: Left arm, Chest, Right arm, Abdomen, Front perineal area, Buttocks, Face, Right upper leg, Left upper leg   Body parts bathed by helper: Right lower leg, Left lower leg     Bathing assist Assist Level: Minimal Assistance - Patient > 75%     Upper Body Dressing/Undressing Upper body dressing   What is the patient wearing?: Pull over shirt    Upper body assist Assist Level: Supervision/Verbal cueing    Lower Body Dressing/Undressing Lower body dressing      What is the patient wearing?: Pants, Underwear/pull up     Lower body assist Assist  for lower body dressing: Moderate Assistance - Patient 50 - 74%     Toileting Toileting    Toileting assist Assist for toileting: Minimal Assistance - Patient > 75%     Transfers Chair/bed transfer  Transfers assist     Chair/bed transfer assist level: Contact Guard/Touching assist     Locomotion Ambulation   Ambulation assist      Assist level: Contact Guard/Touching assist Assistive device: Walker-rolling Max distance: 50   Walk 10 feet activity   Assist     Assist level: Contact Guard/Touching assist Assistive device: Walker-rolling   Walk 50 feet activity   Assist Walk 50 feet with 2 turns activity did not occur: Safety/medical  concerns  Assist level: Contact Guard/Touching assist Assistive device: Walker-rolling    Walk 150 feet activity   Assist Walk 150 feet activity did not occur: Safety/medical concerns  Assist level: Contact Guard/Touching assist Assistive device: Walker-rolling    Walk 10 feet on uneven surface  activity   Assist Walk 10 feet on uneven surfaces activity did not occur: Safety/medical concerns         Wheelchair     Assist Is the patient using a wheelchair?: Yes Type of Wheelchair: Manual    Wheelchair assist level: Dependent - Patient 0%      Wheelchair 50 feet with 2 turns activity    Assist        Assist Level: Dependent - Patient 0%   Wheelchair 150 feet activity     Assist      Assist Level: Dependent - Patient 0%   Blood pressure 126/80, pulse 88, temperature 97.7 F (36.5 C), resp. rate 17, height 6' (1.829 m), weight 108.7 kg, SpO2 99%.   Medical Problem List and Plan: 1. 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             -patient may  shower             -ELOS/Goals: 10/15, PT/OT/SLP Sup  -Continue CIR therapies including PT, OT, and SLP  2.  L-internal jugular DVT/ A fib/Antithrombotics: -DVT/anticoagulation:  Pharmaceutical:LUE cephalic and internal jugular DVT Coumadin and Heparin bridge to change to lovenox at discharge             -antiplatelet therapy: ASA 3. Pain Management:  oxycodone or tramadol prn for pain.  4. Mood/Behavior/Sleep: LCSW to follow for evaluation and support.              -antipsychotic agents: N/A             -- Order melatonin prn for insomnia.  Scheduled trazodone nightly   -reports reasonable sleep 5.  Neuropsych/cognition: This patient is capable of making decisions on his own behalf. 6. Skin/Wound Care: Routine pressure relief measures.  7. Fluids/Electrolytes/Nutrition: Strict I/O. Check CMET in am.  8. 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 Fluid overload/SOB: Lasix 40 mg decreased to daily on 09/25 --developed cough last nigh with SOB 09/27 pm.  --daily weights with monitoring for signs of overload. .  -wt stable, monitor Edema in legs  will give IV lasix x 1 today.  CXR unchanged, cards to f/u Wts are stable and actually decreased today 10/5    Filed Weights   04/15/23 0436 04/16/23 0441 04/17/23 0500  Weight: 109.9 kg (S) 107.5 kg 108.7 kg  Repeat IV lasix this am 10/4-appreciate cardiology consult  10/5 echo demonstrates large pericardial effusion, likely post-op -card recommended followng CRP and ESR--both are elevated. If they trend up then potentially colchicine rx.    -*iv lasix daily thru Monday then convert to torsemide 10/8 per cards   -*f/u crp and esr ordered for Monday   -no complaints today   -bp's holding Reduced edema on exam , no SOB, endurance in therapy improving  10. A fib: Continue on amiodarone 400 mg bid since 09/25-->taper? May need to contact cards for this  11. Pre diabetes/Stress induced hyperglycemia: Monitor BS ac/hs.  --Hgb A1C 6.2 (prediabetic). --diet changed to CM from regular. Continue Levemir 8 units daily with SSI prn for tighter BS control. .  --Transition insulin to metformin as intake improves.   -9/30 decrease levemir to 6 u CBG (last 3)  Recent Labs    04/17/23 1644 04/17/23 2054 04/18/23 0635  GLUCAP 106* 118* 102*  10/7 CBGs well controlled  12 ABLA:   Monitor for signs of bleeding             --transfuse prn <7.0 or if symptomatic. Unclear whether fatigue is related to anemia.  Walked further  last week with similar Hgb  -9/30 hemoglobin stable at 7.9 yesterday  CBC    Component Value Date/Time   WBC 7.5 04/17/2023 0400   RBC 3.07 (L) 04/17/2023 0400   HGB 8.4 (L) 04/17/2023 0400   HCT 27.9 (L) 04/17/2023 0400   PLT 391 04/17/2023 0400   MCV 90.9 04/17/2023 0400   MCH 27.4 04/17/2023 0400   MCHC 30.1 04/17/2023 0400   RDW 17.4 (H) 04/17/2023 0400   10/6 hgb 7.6 10/4--f/u Monday--suspect at least part of fatigue is related to this  10/7 HGB up to 8.4 today Recheck Hgb 1 wk    13. Constipation: Resolved and now w/diarrhea. Will hold laxative.    -9/30 frequent BM not liquid, continue to hold laxatives  14. Hyponatremia,mild  -10/7 Na stable at 137  15.  Hypo K+   -KCL currently daily repeat BMET on Monday or as per cardiology  -10/7 K+ up to 4.0, improved will decrease to daily, recheck thursday  LOS: 9 days A FACE TO FACE EVALUATION WAS PERFORMED  Erick Colace 04/18/2023, 10:28 AM

## 2023-04-18 NOTE — Progress Notes (Signed)
Physical Therapy Session Note  Patient Details  Name: Dale Mills MRN: [id number] Date of Birth: 1966-06-19  Today's Date: 04/18/2023 PT Individual Time: 0806-0908 PT Individual Time Calculation (min): 62 min   Short Term Goals: Week 1:  PT Short Term Goal 1 (Week 1): Pt will STS CGA w/ LRAD PT Short Term Goal 2 (Week 1): Pt will ambulate 50 ft modA w/ LRAD PT Short Term Goal 3 (Week 1): Pt will navigate ramp modA w/ LRAD PT Short Term Goal 4 (Week 1): Pt w/ perform bed to chair transfer w/ minA using LRAD PT Short Term Goal 5 (Week 1): Pt will require minA for bed mobility   Skilled Therapeutic Interventions/Progress Updates:  Patient supine in bed on entrance to room. Patient alert and agreeable to PT session. Rn present and providing morning meds. Pt requires extra time to take pills one at a time. IV abx started at beginning of session.   Patient with no pain complaint at start of session.  Therapeutic Activity: Bed Mobility: TED hose donned with MaxA while pt still supine.  Pt performed supine > sit with CGA/ supervision with HOB elevated. VC/ tc required for min technique. Transfers: Pt performed sit<>stand and stand pivot transfers at start of session with minA for power up but is able to improve following NMR to supervision/ CGA. Provided verbal cues for technique.  Gait Training:  Pt ambulated from room to day room with slow but consistent pace using RW with CGA/ supervision. Provided vc/ tc for upright posture, level gaze and proximity to walker.  Neuromuscular Re-ed: NMR facilitated during session with focus on motor control and standing balance with add'l focus on activity tolerance. Pt guided in blocked practice of alternating step ups to 6" step and swing LE into high knee. Performed with good control and balance. Progressed to blocked practice of sit<>stands with no AD. Guided with verbal instructions for positioning and balance shifting required with hip hinge and  provided with visual demonstration. Pt improves to performance with hands on knees to assist with power up and performing with close supervision.   NuStep L5 x15 min with pace maintained between 40-48 steps/ min, and METs 1.6-1.9  BP taken just after nuStep in sitting = 116/ 82 (93), HR = 99bpm  BP take a few minutes into rest with decreased RR and BP = 114/ 81 (90), pulse = 100 bpm  NMR performed for improvements in motor control and coordination, balance, sequencing, judgement, and self confidence/ efficacy in performing all aspects of mobility at highest level of independence.   Patient feels good at end of session and is seated upright with pass off to OT for next session.    Therapy Documentation Precautions:  Precautions Precautions: Sternal, Fall Precaution Comments: Verbal education provided during eval. Pt able to verbalize he "doesn't need to open up the incision w/ movement" Restrictions Weight Bearing Restrictions: No RUE Weight Bearing: Weight bearing as tolerated LUE Weight Bearing: Weight bearing as tolerated Other Position/Activity Restrictions: sternal precaution  Pain:  No pain complaint this session.   Therapy/Group: Individual Therapy  Loel Dubonnet PT, DPT, CSRS 04/18/2023, 10:29 AM

## 2023-04-18 NOTE — Plan of Care (Signed)
  Problem: RH Attention Goal: LTG Patient will demonstrate this level of attention during functional activites (SLP) Description: LTG:  Patient will will demonstrate this level of attention during functional activites (SLP) Flowsheets (Taken 04/18/2023 1133) LTG: Patient will demonstrate this level of attention during cognitive/linguistic activities with assistance of (SLP): Modified Independent Note: Goal upgraded to reflect progress and need for advanced goals   Problem: RH Expression Communication Goal: LTG Patient will increase speech intelligibility (SLP) Description: LTG: Patient will increase speech intelligibility at word/phrase/conversation level with cues, % of the time (SLP) Flowsheets (Taken 04/18/2023 1133) LTG: Patient will increase speech intelligibility (SLP): Modified Independent Note: Goal upgraded to reflect progress and need for advanced goals

## 2023-04-18 NOTE — Progress Notes (Signed)
Occupational Therapy Session Note  Patient Details  Name: MAXXON SCHWANKE MRN: [id number] Date of Birth: [birth year]  Today's Date: 04/18/2023 OT Individual Time: 4010-2725 OT Individual Time Calculation (min): 53 min  OT Individual Time: 1345-1430 OT Individual Time Calculation (min): 45 min   Short Term Goals: Week 2:  OT Short Term Goal 1 (Week 2): STG=LTGs d/t Pt ELOS  Skilled Therapeutic Interventions/Progress Updates:     AM Session: Pt received in therapy gym- direct hand-off from PT. Pt presenting to be in good spirits receptive to skilled OT session reporting 0/10 pain- OT offering intermittent rest breaks, repositioning, and therapeutic support to optimize participation in therapy session. Focus this session visual scanning, activity tolerance,  functional mobility training, and dynamic balance.   Engaged Pt in visual scanning activity to increase Pt's attention to L visual field and work on compensatory visual scanning techniques. Pt instructed to navigate around therapy gym to locate colored disks while navigating around obstacles using RW for balance with simulated home navigation and safety awareness incorporated into task. Pt required 3 seated rest breaks during activity for energy conservation. Pt with improved ability to listen to his body and appropriately request rest breaks vs overly push himself. Mod verbal cues and increased time required to locate colored disks, however Pt appropriately using visual scanning techniques. CGA to light min A provided for balance and RW management.   Pt requesting to use BR. Pt completed functional mobility to hallway BR using RW with CGA. Pt able to complete clothing management and maintain standing balance while voiding with CGA- continent void documented in flowsheets. Pt stood at sink with RW to wash hands with no LOB and seated rest break provided following.   Engaged Pt in dynamic balance stepping activity to increase LB strength, balance,  and foot height and to facilitate weight shifting as preparatory task for functional mobility/transfers. Positioned mirror anterior to Pt for increased visual feedback of body alignment and placed cone on floor with Pt instructed to maintain balance with RW while stepping R/L foot onto cone. Pt able to complete single leg steps to cone R/L 2x10 trials and 2x10 reps alternating stepping R/L. Pt heavily relying on B UEs supported on RW and min A provided for balance. Pt required increased rest breaks between each trial d/t fatigue.   Transported Pt back to room total A in wc for energy conservation Pt was left resting in wc with call bell in reach, seat belt alarm on, and all needs met.    PM Session:  Pt received lightly sleeping in his wc waking upon OT arrival. Pt presenting to be in good spirits receptive to skilled OT session reporting 0/10 pain- OT offering intermittent rest breaks, repositioning, and therapeutic support to optimize participation in therapy session. Transported Pt total A to therapy gym in wc for time management and energy conservation.   Engaged Pt in dynamic standing balance activity with anterior reaching and visual scanning incorporated into task to work on standing balance, attention to L, visual motor skills, and functional cognition skilled required for BADLs. Pt instructed to maintain dynamic standing balance on compliant surface using RW while using L UE to retrieve clothes pins from table in L visual field and donn them onto elevated basketball goal net. Pt required min A to maintain balance and demonstrated increased use of L visual scanning strategies. Seated rest break provided following. Pt then reversed task to doff clothes pins and place them on table with CGA to min A  provided for balance.   Engaged Pt in functional mobility activity navigating through hallway and around obstacles using RW for balance to simulate home and community mobility skills and increase activity  tolerance for higher level BADL tasks. Pt able to tolerate completing functional mobility >150 ft x2 trials with seated rest breaks provided between trials. Min verbal cues required to avoid obstacles on his L side and CGA-light min A provided for balance and RW management.   Transported Pt back to room total A in wc. Pt requesting to use BR. Pt maintained standing balance with CGA while holding urinal for continent void- documented in flowsheets. Pt was left resting in wc with call bell in reach, seat belt alarm on, and all needs met.    Therapy Documentation Precautions:  Precautions Precautions: Sternal, Fall Precaution Comments: Verbal education provided during eval. Pt able to verbalize he "doesn't need to open up the incision w/ movement" Restrictions Weight Bearing Restrictions: No RUE Weight Bearing: Weight bearing as tolerated LUE Weight Bearing: Weight bearing as tolerated Other Position/Activity Restrictions: sternal precaution   Therapy/Group: Individual Therapy  Clide Deutscher 04/18/2023, 7:53 AM

## 2023-04-19 LAB — GLUCOSE, CAPILLARY
Glucose-Capillary: 96 mg/dL (ref 70–99)
Glucose-Capillary: 94 mg/dL (ref 70–99)
Glucose-Capillary: 115 mg/dL — ABNORMAL HIGH (ref 70–99)
Glucose-Capillary: 139 mg/dL — ABNORMAL HIGH (ref 70–99)

## 2023-04-19 NOTE — Progress Notes (Addendum)
Patient ID: Dale Mills, male   DOB: [birth year], 57 y.o.   MRN: [id number]  Team Conference Report to Patient/Family  Team Conference discussion was reviewed with the patient and caregiver, including goals, any changes in plan of care and target discharge date.  Patient and caregiver express understanding and are in agreement.  The patient has a target discharge date of 04/25/23.  SW met with patient and provided team conference updates. Pt unaware if his daughter was able to obtain a preferred location for d/c.  Sw called daughter, Amber at bedside and left a detailed VM to discuss d/c location. SW will continue to FU. No additional questions or concerns.   Andria Rhein 04/19/2023, 2:12 PM

## 2023-04-19 NOTE — Progress Notes (Signed)
Speech Language Pathology Daily Session Note  Patient Details  Name: Dale Mills MRN: [id number] Date of Birth: 12/22/1965  Today's Date: 04/19/2023 SLP Individual Time: 0900-1000 SLP Individual Time Calculation (min): 60 min  Short Term Goals: Week 2: SLP Short Term Goal 1 (Week 2): Patient will attend to left visual field during functional tasks with Mod I verbal cues SLP Short Term Goal 2 (Week 2): Patient will recall novel information given a 10- 15 minute delay with 90% acc given min A SLP Short Term Goal 3 (Week 2): Patient will complete moderately complex problem solving with 90% acc with sup A SLP Short Term Goal 4 (Week 2): Patient will utilize speech intelligibility strategies at the conversation level to achieve 100% intelligibility with Mod I verbal cues.  Skilled Therapeutic Interventions: Skilled therapy session focused on cognitive goals. SLP faciliated session by providing min-modA during clock drawing task. Initially patient required education regarding length of hands and minutes versus hours. After explaination, patient able to complete clock drawing with minA. Upon completion of clock drawing, patient prompted to identify problem/solution in functional picture cards. Patient able to identify problem/solution given supervision A, though occasionally required re-direction to attend to task. Patient continent of bladder during session. Patient left in [facility] with alarm set and call bell in reach. Continue POC.   Pain Pain Assessment Pain Scale: 0-10 Pain Score: 0-No pain  Therapy/Group: Individual Therapy  Leiya Keesey M.A., CF-SLP 04/19/2023, 10:05 AM

## 2023-04-19 NOTE — Progress Notes (Signed)
Patient ID: Dale Mills, male   DOB: Apr 23, 1966, 57 y.o.   MRN: [id number]  SW spoke to daughters, Hospital doctor and [place]. Daughters have decided on a d/c to SNF and then plan to transition patient to ALF. SW discussed the SNF process with daughters. Sw explained SNF VS. ALF.   Daughter would prefer SNF referral to be sent to [facility] or [place], [place].

## 2023-04-19 NOTE — Progress Notes (Signed)
Occupational Therapy Session Note  Patient Details  Name: Dale Mills MRN: [id number] Date of Birth: [birth year]  Today's Date: 04/19/2023 OT Individual Time: 1106-1200 OT Individual Time Calculation (min): 54 min    Short Term Goals: Week 2:  OT Short Term Goal 1 (Week 2): STG=LTGs d/t Pt ELOS  Skilled Therapeutic Interventions/Progress Updates:     Pt received returning from restroom with nursing staff. Pt dressed and ready for the day upon OT arrival with all BADL needs met. Pt presenting to be in good spirits receptive to skilled OT session reporting 0/10 pain- OT offering intermittent rest breaks, repositioning, and therapeutic support to optimize participation in therapy session. Focus this session activity tolerance, functional mobility training, and functional cognition. Engaged Pt in hospital navigation activity to work on visually scanning, memory, problem solving, and attention skills required for higher level BADL/IADL tasks. Pt instructed to utilize hospital signs to navigate to hospital gift shop/atrium. Transported Pt total A to gift shop in wc for time management and energy conservation. Pt required mod verbal cues to locate signs, however once Pt's aware of sign location Pt able to visually scan to find needed directions with increased time and min questioning cues. During activity, Pt required mod questioning cues to correctly recall direction from signs. Pt then transported to outdoor location to support improved moral and change in environment.  Engaged Pt in functional mobility training using RW to increase endurance and safety for BADLs. Pt able to complete ~180ft of functional mobility x5 trials with seated rest breaks provided between and following trials. Pt able to appropriately request rest breaks and implement energy conservation techniques with min questioning cues during session. Pt transported back to room total A in wc for energy conservation with Pt able to recall  correct location of his rooming including floor, room number, and tower with min questioning cues. Pt was left resting in wc with call bell in reach, seatbelt alarm on, and all needs met.    Therapy Documentation Precautions:  Precautions Precautions: Sternal, Fall Precaution Comments: Verbal education provided during eval. Pt able to verbalize he "doesn't need to open up the incision w/ movement" Restrictions Weight Bearing Restrictions: Yes RUE Weight Bearing: Weight bearing as tolerated LUE Weight Bearing: Weight bearing as tolerated Other Position/Activity Restrictions: sternal precaution  Therapy/Group: Individual Therapy  Clide Deutscher 04/19/2023, 12:35 PM

## 2023-04-19 NOTE — Progress Notes (Signed)
PROGRESS NOTE   Subjective/Complaints: Working on clock drawing with OT this am , no SOB   Review of Systems  Constitutional:  Negative for chills, fever and malaise/fatigue.  Eyes:  Positive for blurred vision.  Respiratory:  Negative for shortness of breath.   Cardiovascular:  Negative for chest pain.  Gastrointestinal:  Negative for diarrhea, nausea and vomiting.  Genitourinary: Negative.   Musculoskeletal:  Positive for joint pain.  Skin:  Negative for rash.  Neurological:  Positive for weakness.  Psychiatric/Behavioral:  The patient has insomnia.     Objective:   No results found. Recent Labs    04/17/23 0400  WBC 7.5  HGB 8.4*  HCT 27.9*  PLT 391   Recent Labs    04/17/23 0400  NA 137  K 4.0  CL 103  CO2 25  GLUCOSE 97  BUN 10  CREATININE 0.58*  CALCIUM 8.9     Intake/Output Summary (Last 24 hours) at 04/19/2023 1029 Last data filed at 04/19/2023 0710 Gross per 24 hour  Intake 2201 ml  Output 2550 ml  Net -349 ml        Physical Exam: Vital Signs Blood pressure (!) 130/91, pulse 88, temperature 97.8 F (36.6 C), resp. rate 17, height 6' (1.829 m), weight 105.8 kg, SpO2 100%.   General: No acute distress Mood and affect are appropriate Heart: Regular rate and rhythm no rubs murmurs or extra sounds Lungs: Clear to auscultation, breathing unlabored, no rales or wheezes Abdomen: Positive bowel sounds, soft nontender to palpation, nondistended Extremities: No clubbing, cyanosis, or edema on right 1+ edema left   Skin: No evidence of breakdown, no evidence of rash, chest incision CDI Neurologic: Cranial nerves II through XII intact, motor strength is 5/5 in bilateral deltoid, bicep, tricep, grip, 4/5 hip flexor, knee extensors, ankle dorsiflexor and plantar flexor Neurological exam stable today 04/19/2023  Left visual field cut with neglect partially compensated Musculoskeletal: Full range of  motion in all 4 extremities. No joint swelling    Assessment/Plan: 1. Functional deficits which require 3+ hours per day of interdisciplinary therapy in a comprehensive inpatient rehab setting. Physiatrist is providing close team supervision and 24 hour management of active medical problems listed below. Physiatrist and rehab team continue to assess barriers to discharge/monitor patient progress toward functional and medical goals  Care Tool:  Bathing    Body parts bathed by patient: Left arm, Chest, Right arm, Abdomen, Front perineal area, Buttocks, Face, Right upper leg, Left upper leg   Body parts bathed by helper: Right lower leg, Left lower leg     Bathing assist Assist Level: Minimal Assistance - Patient > 75%     Upper Body Dressing/Undressing Upper body dressing   What is the patient wearing?: Pull over shirt    Upper body assist Assist Level: Supervision/Verbal cueing    Lower Body Dressing/Undressing Lower body dressing      What is the patient wearing?: Pants, Underwear/pull up     Lower body assist Assist for lower body dressing: Moderate Assistance - Patient 50 - 74%     Toileting Toileting    Toileting assist Assist for toileting: Minimal Assistance - Patient > 75%  Transfers Chair/bed transfer  Transfers assist     Chair/bed transfer assist level: Contact Guard/Touching assist     Locomotion Ambulation   Ambulation assist      Assist level: Contact Guard/Touching assist Assistive device: Walker-rolling Max distance: 50   Walk 10 feet activity   Assist     Assist level: Contact Guard/Touching assist Assistive device: Walker-rolling   Walk 50 feet activity   Assist Walk 50 feet with 2 turns activity did not occur: Safety/medical concerns  Assist level: Contact Guard/Touching assist Assistive device: Walker-rolling    Walk 150 feet activity   Assist Walk 150 feet activity did not occur: Safety/medical  concerns  Assist level: Contact Guard/Touching assist Assistive device: Walker-rolling    Walk 10 feet on uneven surface  activity   Assist Walk 10 feet on uneven surfaces activity did not occur: Safety/medical concerns         Wheelchair     Assist Is the patient using a wheelchair?: Yes Type of Wheelchair: Manual    Wheelchair assist level: Dependent - Patient 0%      Wheelchair 50 feet with 2 turns activity    Assist        Assist Level: Dependent - Patient 0%   Wheelchair 150 feet activity     Assist      Assist Level: Dependent - Patient 0%   Blood pressure (!) 130/91, pulse 88, temperature 97.8 F (36.6 C), resp. rate 17, height 6' (1.829 m), weight 105.8 kg, SpO2 100%.   Medical Problem List and Plan: 1. 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             -patient may  shower             -ELOS/Goals: 10/15, PT/OT/SLP Sup  -Continue CIR therapies including PT, OT, and SLP , Team conference today please see physician documentation under team conference tab, met with team  to discuss problems,progress, and goals. Formulized individual treatment plan based on medical history, underlying problem and comorbidities.  2.  L-internal jugular DVT/ A fib/Antithrombotics: -DVT/anticoagulation:  Pharmaceutical:LUE cephalic and internal jugular DVT Coumadin and Heparin bridge to change to lovenox at discharge             -antiplatelet therapy: ASA 3. Pain Management:  oxycodone or tramadol prn for pain.  4. Mood/Behavior/Sleep: LCSW to follow for evaluation and support.              -antipsychotic agents: N/A             -- Order melatonin prn for  insomnia.  Scheduled trazodone nightly   -reports reasonable sleep 5. Neuropsych/cognition: This patient is capable of making decisions on his own behalf. 6. Skin/Wound Care: Routine pressure relief measures.  7. Fluids/Electrolytes/Nutrition: Strict I/O. Check CMET in am.  8. 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 Fluid overload/SOB: Lasix 40 mg decreased to daily on 09/25 --developed cough last nigh with SOB 09/27 pm.  --daily weights with monitoring for signs of overload. .  -wt stable, monitor Edema in legs will  give IV lasix x 1 today.  CXR unchanged, cards to f/u Wts are stable and actually decreased today 10/5    Filed Weights   04/16/23 0441 04/17/23 0500 04/19/23 0500  Weight: (S) 107.5 kg 108.7 kg 105.8 kg  Repeat IV lasix this am 10/4-appreciate cardiology consult  10/5 echo demonstrates large pericardial effusion, likely post-op -card recommended followng CRP and ESR--both are elevated. If they trend up then potentially colchicine rx.    -*iv lasix daily thru Monday then convert to torsemide 10/8 per cards   -*f/u crp and esr ordered for Monday   -no complaints today   -bp's holding Reduced edema on exam , no SOB, endurance in therapy improving -cont torsemide 20mg  qd 10. A fib: Continue on amiodarone 200 mg every day and coumadin 11. Pre diabetes/Stress induced hyperglycemia: Monitor BS ac/hs.  --Hgb A1C 6.2 (prediabetic). --diet changed to CM from regular. Continue Levemir 8 units daily with SSI prn for tighter BS control. .  --Transition insulin to metformin as intake improves.   -9/30 decrease levemir to 6 u CBG (last 3)  Recent Labs    04/18/23 1637 04/18/23 2048 04/19/23 0624  GLUCAP 88 89 96  10/7 CBGs well controlled  12 ABLA:   Monitor for signs of bleeding             --transfuse prn <7.0 or if symptomatic. Slowly  improving at 8.4  -9/30 hemoglobin stable at 7.9 yesterday  CBC    Component Value Date/Time   WBC 7.5 04/17/2023 0400   RBC 3.07 (L) 04/17/2023 0400   HGB 8.4 (L) 04/17/2023 0400   HCT 27.9 (L) 04/17/2023 0400   PLT 391 04/17/2023 0400   MCV 90.9 04/17/2023 0400   MCH 27.4 04/17/2023 0400   MCHC 30.1 04/17/2023 0400   RDW 17.4 (H) 04/17/2023 0400   10/6 hgb 7.6 10/4--f/u Monday--suspect at least part of fatigue is related to this  10/7 HGB up to 8.4 today Recheck Hgb 1 wk    13. Constipation: Resolved and now w/diarrhea. Will hold laxative.    -9/30 frequent BM not liquid, continue to hold laxatives  14. Hyponatremia,mild  -10/7 Na stable at 137  15.  Hypo K+   -KCL currently daily repeat BMET on Monday or as per cardiology  -10/7 K+ up to 4.0, improved will decrease to daily, recheck thursday  LOS: 10 days A FACE TO FACE EVALUATION WAS PERFORMED  Erick Colace 04/19/2023, 10:29 AM

## 2023-04-19 NOTE — Progress Notes (Signed)
Physical Therapy Weekly Progress Note  Patient Details  Name: Dale Mills MRN: [id number] Date of Birth: [birth year]  Beginning of progress report period: April 11, 2023 End of progress report period: April 19, 2023  Today's Date: 04/19/2023 PT Individual Time: 6295-2841 and 1420-1503 PT Individual Time Calculation (min): 45 min and 43 min  Patient has met 4 of 5 short term goals. Dale Mills is progressing well in therapy. He currently is CGA for bed mobility using elevated HOB. CGA w/ momentum for STS and bed to chair transfers. Occasional MinA for powerup STS with fatigue. Pt is CGA for approximately 200 ft using RW. Pt currently has orthostatic hypotension from sitting to standing w/ no symptoms. Pt still benefits from CIR therapy to improve functional mobility to prepare for D/C to SNF vs ALF, potentially daughters house if not approved for the above.  Patient continues to demonstrate the following deficits muscle weakness, decreased cardiorespiratoy endurance, impaired timing and sequencing, unbalanced muscle activation, decreased coordination, and decreased motor planning, decreased visual perceptual skills and field cut, decreased attention to left and decreased motor planning, decreased initiation, decreased attention, decreased awareness, decreased problem solving, decreased safety awareness, and delayed processing,  , and decreased standing balance, decreased postural control, decreased balance strategies, and difficulty maintaining precautions and therefore will continue to benefit from skilled PT intervention to increase functional independence with mobility.  Patient progressing toward long term goals..  Continue plan of care.  PT Short Term Goals Week 1:  PT Short Term Goal 1 (Week 1): Pt will STS CGA w/ LRAD PT Short Term Goal 1 - Progress (Week 1): Progressing toward goal PT Short Term Goal 2 (Week 1): Pt will ambulate 50 ft modA w/ LRAD PT Short Term Goal 2 - Progress (Week 1):  Met PT Short Term Goal 3 (Week 1): Pt will navigate ramp modA w/ LRAD PT Short Term Goal 3 - Progress (Week 1): Met PT Short Term Goal 4 (Week 1): Pt w/ perform bed to chair transfer w/ minA using LRAD PT Short Term Goal 4 - Progress (Week 1): Met PT Short Term Goal 5 (Week 1): Pt will require minA for bed mobility PT Short Term Goal 5 - Progress (Week 1): Met Week 2:  PT Short Term Goal 1 (Week 2): = LTG's  Skilled Therapeutic Interventions/Progress Updates:    Session 1: Pt received in bed, alarm activated, agreeable to PT.  Orthostatic vitals: (no symptoms throughout) - lying: BP 111/78 , MAP 87, 97 bpm - sitting: BP 108/86 , MAP 94, 102 bpm  - standing: BP 99/70 , MAP 78, 105 bpm  Bed mobility CGA w/ use of HOB elevated. Pt able to maintain precautions w/ occasional verbal reminders.  STS from bed & WC: CGA w/ momentum for power up  Gait training for endurance:  ~250 ft x2 CGA using RW. Pt demo improvements in obstacle navigation, adequate gait mechanics, and improved velocity. Pt ambulates w/ LLE IR.  - BP post 1st walk at beginning of session 136/85, MAP 101, 108 bpm.  - post 2nd walk at end of session 119 bpm (brought down by 110 within 2 minutes) - Ramp navigation w/ R railing support, minA for stability. Pt demo decreased gait velocity d/t being cautious.  Pt left in WC, alarm activated, all needs met.  Session 2:Pt received in [facility], alarm activated, agreeable to PT.  STS from WC: CGA w/ multiple attempts for power up  Gait training for endurance: - 90 ft x2 (once w/  R turns, once w/ L turns) use of RW, CGA. Verbal cues to slow down to control postural stability. - 127 ft +2 minA for balance & for R HHA while navigating IV pole. Pt demo increased fatigue halfway through. D/t fatigue pt presented with increased gait speed resulting in decreased balance strategies and motor control. L inattention to surroundings. 106 bpm post walk - 30 ft w/ RW, CGA  Stair training: x8 6"  CGA w/ minA at the top to turn around. BHR alternating steps up, step to step coming down.  Pt requires verbal cues for processing directions and safety, more prevalent in busy environments.  Pt left in WC, alarm activated, all needs met.  Therapy Documentation Precautions:  Precautions Precautions: Sternal, Fall Precaution Comments: Verbal education provided during eval. Pt able to verbalize he "doesn't need to open up the incision w/ movement" Restrictions Weight Bearing Restrictions: Yes RUE Weight Bearing: Weight bearing as tolerated LUE Weight Bearing: Weight bearing as tolerated Other Position/Activity Restrictions: sternal precaution Pain: Session 1: Pt presents with B shoulder fatigue and soreness during session. Discomfort did not limit participation. Session 2: Pt presents with no pain during session.  Therapy/Group: Individual Therapy  Dale Mills 04/19/2023, 7:57 AM

## 2023-04-20 LAB — PROTIME-INR
INR: 2.4 — ABNORMAL HIGH (ref 0.8–1.2)
Prothrombin Time: 26.6 s — ABNORMAL HIGH (ref 11.4–15.2)

## 2023-04-20 LAB — CBC
HCT: 29.2 % — ABNORMAL LOW (ref 39.0–52.0)
Hemoglobin: 9 g/dL — ABNORMAL LOW (ref 13.0–17.0)
MCH: 27.4 pg (ref 26.0–34.0)
MCHC: 30.8 g/dL (ref 30.0–36.0)
MCV: 88.8 fL (ref 80.0–100.0)
Platelets: 341 10*3/uL (ref 150–400)
RBC: 3.29 MIL/uL — ABNORMAL LOW (ref 4.22–5.81)
RDW: 17.2 % — ABNORMAL HIGH (ref 11.5–15.5)
WBC: 6.9 10*3/uL (ref 4.0–10.5)
nRBC: 0 % (ref 0.0–0.2)

## 2023-04-20 LAB — BASIC METABOLIC PANEL
Anion gap: 9 (ref 5–15)
BUN: 14 mg/dL (ref 6–20)
CO2: 28 mmol/L (ref 22–32)
Calcium: 9.3 mg/dL (ref 8.9–10.3)
Chloride: 100 mmol/L (ref 98–111)
Creatinine, Ser: 0.74 mg/dL (ref 0.61–1.24)
GFR, Estimated: >60 mL/min (ref >60)
Glucose, Bld: 95 mg/dL (ref 70–99)
Potassium: 3.7 mmol/L (ref 3.5–5.1)
Sodium: 137 mmol/L (ref 135–145)

## 2023-04-20 LAB — GLUCOSE, CAPILLARY
Glucose-Capillary: 107 mg/dL — ABNORMAL HIGH (ref 70–99)
Glucose-Capillary: 91 mg/dL (ref 70–99)
Glucose-Capillary: 109 mg/dL — ABNORMAL HIGH (ref 70–99)
Glucose-Capillary: 117 mg/dL — ABNORMAL HIGH (ref 70–99)

## 2023-04-20 MED ORDER — WARFARIN SODIUM 4 MG PO TABS
4.0000 mg | ORAL_TABLET | Freq: Every day | ORAL | Status: DC
  Administered 2023-04-20 – 2023-04-26 (×7): 4 mg via ORAL
  Filled 2023-04-20 (×7): qty 1

## 2023-04-20 NOTE — NC FL2 (Signed)
[place] MEDICAID FL2 LEVEL OF CARE FORM     IDENTIFICATION  Patient Name: Dale Mills Birthdate: 1965/08/04 Sex: male Admission Date (Current Location): 04/09/2023  [place] and [place] Number:  Haynes Bast [id number] M Facility and Address:  [facility], [address], [place], [place] [address]      Provider Number:    Attending Physician Name and Address:  Erick Colace, MD  Relative Name and Phone Number:  Lenny Pastel [phone]    Current Level of Care: Hospital Recommended Level of Care: Skilled Nursing Facility Prior Approval Number:    Date Approved/Denied:   PASRR Number:    Discharge Plan: SNF    Current Diagnoses: Patient Active Problem List   Diagnosis Date Noted   Pericardial effusion after operative procedure 04/14/2023   Shortness of breath 04/14/2023   Bilateral lower extremity edema 04/14/2023   Debility 04/10/2023   Acute ischemic left posterior cerebral artery (PCA) stroke (HCC) 04/09/2023   S/P aortic valve replacement and aortoplasty 03/31/2023   Protein-calorie malnutrition, severe 03/30/2023   Acute respiratory failure (HCC) 03/29/2023   Septic shock (HCC) 03/29/2023   Enterococcal bacteremia 03/29/2023   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   Iron deficiency anemia 03/20/2023   Coronary artery disease coronary CT angio did not show any major issues, suspicion for distal disease 06/12/2020   Angina pectoris (HCC) Canadian classification 2 04/10/2020   Precordial chest pain 02/13/2020   Essential hypertension 02/13/2020   Dyslipidemia 02/13/2020    Orientation RESPIRATION BLADDER Height & Weight     Self, Time, Situation, Place  Normal Continent (episodes of inct) Weight: 233 lb 7.5 oz (105.9 kg) Height:  6' (182.9 cm)  BEHAVIORAL SYMPTOMS/MOOD NEUROLOGICAL BOWEL NUTRITION STATUS      Continent Diet  AMBULATORY STATUS COMMUNICATION OF NEEDS Skin   Extensive Assist Verbally Other (Comment) (midline  incison, OTA)                       Personal Care Assistance Level of Assistance  Bathing, Dressing Bathing Assistance: Limited assistance   Dressing Assistance: Limited assistance     Functional Limitations Info             SPECIAL CARE FACTORS FREQUENCY  PT (By licensed PT), OT (By licensed OT), Speech therapy     PT Frequency: 5x a week OT Frequency: 5x a week     Speech Therapy Frequency: 5x a week      Contractures      Additional Factors Info  Code Status Code Status Info: FULL             Current Medications (04/20/2023):  This is the current hospital active medication list Current Facility-Administered Medications  Medication Dose Route Frequency Provider Last Rate Last Admin   (feeding supplement) PROSource Plus liquid 30 mL  30 mL Oral BID BM Love, Pamela S, PA-C   30 mL at 04/20/23 1535   acetaminophen (TYLENOL) tablet 325-650 mg  325-650 mg Oral Q4H PRN Love, Pamela S, PA-C       alum & mag hydroxide-simeth (MAALOX/MYLANTA) 200-200-20 MG/5ML suspension 30 mL  30 mL Oral Q4H PRN Love, Pamela S, PA-C       amiodarone (PACERONE) tablet 200 mg  200 mg Oral Daily Erick Colace, MD   200 mg at 04/20/23 0736   ampicillin (OMNIPEN) 2 g in sodium chloride 0.9 % 100 mL IVPB  2 g Intravenous Q4H Kirsteins, Victorino Sparrow, MD 300  mL/hr at 04/20/23 1538 2 g at 04/20/23 1538   ascorbic acid (VITAMIN C) tablet 500 mg  500 mg Oral Daily Jacquelynn Cree, PA-C   500 mg at 04/20/23 3086   aspirin EC tablet 81 mg  81 mg Oral Daily Jacquelynn Cree, PA-C   81 mg at 04/20/23 0736   atorvastatin (LIPITOR) tablet 80 mg  80 mg Oral Daily Jacquelynn Cree, PA-C   80 mg at 04/20/23 5784   bisacodyl (DULCOLAX) suppository 10 mg  10 mg Rectal Daily PRN Love, Pamela S, PA-C       cefTRIAXone (ROCEPHIN) 2 g in sodium chloride 0.9 % 100 mL IVPB  2 g Intravenous Q12H Erick Colace, MD 200 mL/hr at 04/20/23 0745 2 g at 04/20/23 0745   Chlorhexidine Gluconate Cloth 2 % PADS 6 each   6 each Topical Q12H Erick Colace, MD   6 each at 04/20/23 6962   diphenhydrAMINE (BENADRYL) capsule 25 mg  25 mg Oral Q6H PRN Love, Pamela S, PA-C       ezetimibe (ZETIA) tablet 10 mg  10 mg Oral Daily Jacquelynn Cree, PA-C   10 mg at 04/20/23 0736   Fe Fum-Vit C-Vit B12-FA (TRIGELS-F FORTE) capsule 1 capsule  1 capsule Oral QPC breakfast Jacquelynn Cree, PA-C   1 capsule at 04/20/23 0736   guaiFENesin-dextromethorphan (ROBITUSSIN DM) 100-10 MG/5ML syrup 5-10 mL  5-10 mL Oral Q6H PRN Jacquelynn Cree, PA-C   10 mL at 04/12/23 0323   insulin aspart (novoLOG) injection 2-6 Units  2-6 Units Subcutaneous TID AC & HS Jacquelynn Cree, PA-C   2 Units at 04/19/23 2241   insulin detemir (LEVEMIR) injection 6 Units  6 Units Subcutaneous Daily Fanny Dance, MD   6 Units at 04/20/23 0736   melatonin tablet 3 mg  3 mg Oral QHS PRN Fanny Dance, MD       metoprolol tartrate (LOPRESSOR) tablet 12.5 mg  12.5 mg Oral BID Love, Pamela S, PA-C   12.5 mg at 04/20/23 9528   nutrition supplement (JUVEN) (JUVEN) powder packet 1 packet  1 packet Oral BID BM Jacquelynn Cree, PA-C   1 packet at 04/20/23 1535   Oral care mouth rinse  15 mL Mouth Rinse 4 times per day Love, Pamela S, PA-C   15 mL at 04/20/23 1535   oxyCODONE (Oxy IR/ROXICODONE) immediate release tablet 5-10 mg  5-10 mg Oral Q3H PRN Love, Pamela S, PA-C       pantoprazole (PROTONIX) EC tablet 40 mg  40 mg Oral Daily Jacquelynn Cree, PA-C   40 mg at 04/20/23 0736   potassium chloride (KLOR-CON M) CR tablet 10 mEq  10 mEq Oral Daily Fanny Dance, MD   10 mEq at 04/20/23 4132   prochlorperazine (COMPAZINE) tablet 5-10 mg  5-10 mg Oral Q6H PRN Delle Reining S, PA-C       Or   prochlorperazine (COMPAZINE) suppository 12.5 mg  12.5 mg Rectal Q6H PRN Love, Pamela S, PA-C       Or   prochlorperazine (COMPAZINE) injection 5-10 mg  5-10 mg Intravenous Q6H PRN Love, Pamela S, PA-C   5 mg at 04/10/23 1026   protein supplement (ENSURE MAX) liquid  11 oz Oral  BID Jacquelynn Cree, PA-C   11 oz at 04/20/23 0742   sodium chloride flush (NS) 0.9 % injection 10-40 mL  10-40 mL Intracatheter Q12H Kirsteins, Victorino Sparrow, MD   10 mL at  04/18/23 2213   sodium chloride flush (NS) 0.9 % injection 10-40 mL  10-40 mL Intracatheter PRN Erick Colace, MD   10 mL at 04/18/23 0804   sodium phosphate (FLEET) enema 1 enema  1 enema Rectal Once PRN Love, Pamela S, PA-C       tamsulosin (FLOMAX) capsule 0.4 mg  0.4 mg Oral QPC supper Erick Colace, MD   0.4 mg at 04/19/23 1722   torsemide (DEMADEX) tablet 20 mg  20 mg Oral Daily Jodelle Red, MD   20 mg at 04/20/23 0736   traMADol (ULTRAM) tablet 50-100 mg  50-100 mg Oral Q4H PRN Love, Pamela S, PA-C       traZODone (DESYREL) tablet 50 mg  50 mg Oral QHS Fanny Dance, MD   50 mg at 04/19/23 2016   warfarin (COUMADIN) tablet 4 mg  4 mg Oral q1600 Erick Colace, MD   4 mg at 04/20/23 1535   Warfarin - Pharmacist Dosing Inpatient   Does not apply [id number] Jerene Pitch   Given at 04/20/23 1535     Discharge Medications: Please see discharge summary for a list of discharge medications.  Relevant Imaging Results:  Relevant Lab Results:   Additional Information SS: [id number]  Andria Rhein

## 2023-04-20 NOTE — Progress Notes (Signed)
Occupational Therapy Session Note  Patient Details  Name: [name] MRN: [id number] Date of Birth: [birth year]  Today's Date: 04/20/2023 OT Individual Time: 1300-1330 OT Individual Time Calculation (min): 30 min    Short Term Goals: Week 2:  OT Short Term Goal 1 (Week 2): STG=LTGs d/t Pt ELOS  Skilled Therapeutic Interventions/Progress Updates:     Pt received sitting up in recliner, dressed and ready for the day upon OT arrival with thigh hight TEDs donned. Pt presenting to be in good spirits receptive to skilled OT session reporting 0/10 pain- OT offering intermittent rest breaks, repositioning, and therapeutic support to optimize participation in therapy session. Focus this session activity tolerance for increased endurance for BADLs. Engaged Pt in completing functional mobility to therapy gym using RW for endurance and functional mobility training with Pt able to ambulate >150 ft with CGA for balance and min verbal cues to avoid obstacles on L side of hallway. Pt transferred to NuStep with CGA for balance. Engaged Pt in LB strength/endurance training on NuStep to increase endurance for BADL. Pt able to tolerate completing 8 minutes of training on level 4 setting and 4 minutes of training on level 5 setting with single rest break provided following. Pt completed functional mobility back to room using RW with CGA and min verbal cues for L side awareness. Pt left resting in recliner with chair pad alarm on, call bell in reach, and all needs met.   Therapy Documentation Precautions:  Precautions Precautions: Sternal, Fall Precaution Comments: Verbal education provided during eval. Pt able to verbalize he "doesn't need to open up the incision w/ movement" Restrictions Weight Bearing Restrictions: No RUE Weight Bearing: Weight bearing as tolerated LUE Weight Bearing: Weight bearing as tolerated Other Position/Activity Restrictions: sternal precaution  Therapy/Group: Individual  Therapy  Dale Mills 04/20/2023, 1:26 PM

## 2023-04-20 NOTE — Progress Notes (Signed)
Physical Therapy Session Note  Patient Details  Name: Dale Mills MRN: [id number] Date of Birth: Jul 29, 1965  Today's Date: 04/20/2023 PT Individual Time: 0803-0900 PT Individual Time Calculation (min): 57 min   Short Term Goals: Week 2:  PT Short Term Goal 1 (Week 2): = LTG's  Skilled Therapeutic Interventions/Progress Updates: Patient sitting in WC on entrance to room. Patient alert and agreeable to PT session.   Patient reported no pain. Pt endorses getting adequate sleep throughout the night.  Therapeutic Activity: Transfers: Pt performed sit<>stand transfers throughout session to RW with CGA for safety. Pt demos safe sit to stands by adhering to sternal precautions by crossing B UE's in front of chest.  Gait Training:  Pt ambulated 25' x 1 using RW with CGA for safety. Pt demonstrated the following gait deviations with therapist providing the described cuing and facilitation for improvement:  - decreased hip flexion L LE (pt aware of deficit by stating "my foot catches the ground" prior to ambulating)  - Pt ambulated around day room/nsg loop in RW with CGA/light minA as pt had 7lb ankle weight to L LE. Pt with VC to increase hip flexion on L throughout. Pt did not required a break (roughly 180'). Pt with VC also to center weight as there was some tendency to lean to the L. Pt did not report SOB symptoms (HR and SpO2 recorded). Pt ambulated short distance from edge of mat to nustep (roughly 20') without ankle weight and noted to have good carry over by showing decreased foot catch on flooring.   Therex:  - Step up to 6" step with L LE x 8 with L HHA. Progressed to adding 7lb ankle weight to L LE in order to increase hip flexor muscle fiber recruitment for increased hip flexion carryover during the gait cycle -> 2 x 10 with VC for pt to avoid sliding foot on step when stepping back to neutral.  - Pt requested use of Nustep at end. Lvl 6 with pt not using B UE's, only B LE's. Pt  performed task for 5 minutes and instructed to maintain around 50 steps per minute. Vitals recorded. 285 steps total  NMR performed for improvements in motor control and coordination, balance, sequencing, judgement, and self confidence/ efficacy in performing all aspects of mobility at highest level of independence.   Patient sitting in WC at end of session with brakes locked, hand off to SLP and all needs within reach.      Therapy Documentation Precautions:  Precautions Precautions: Sternal, Fall Precaution Comments: Verbal education provided during eval. Pt able to verbalize he "doesn't need to open up the incision w/ movement" Restrictions Weight Bearing Restrictions: No RUE Weight Bearing: Weight bearing as tolerated LUE Weight Bearing: Weight bearing as tolerated Other Position/Activity Restrictions: sternal precaution  Vitals: 97% SpO2; 115 bpm - following ambulation around day room/nsg loop 99% SpO2; 110 bpm - following Nustep  Therapy/Group: Individual Therapy  Lyndall Windt PTA 04/20/2023, 12:21 PM

## 2023-04-20 NOTE — Progress Notes (Signed)
Speech Language Pathology Daily Session Note  Patient Details  Name: Dale Mills MRN: [id number] Date of Birth: May 14, 1966  Today's Date: 04/20/2023 SLP Individual Time: 0900-1000 SLP Individual Time Calculation (min): 60 min  Short Term Goals: Week 2: SLP Short Term Goal 1 (Week 2): Patient will attend to left visual field during functional tasks with Mod I verbal cues SLP Short Term Goal 2 (Week 2): Patient will recall novel information given a 10- 15 minute delay with 90% acc given min A SLP Short Term Goal 3 (Week 2): Patient will complete moderately complex problem solving with 90% acc with sup A SLP Short Term Goal 4 (Week 2): Patient will utilize speech intelligibility strategies at the conversation level to achieve 100% intelligibility with Mod I verbal cues.  Skilled Therapeutic Interventions: Skilled therapy session focused on problem solving and attention goals. SLP re-administered medication identification task due to immense improvement since admission. Patient was able to complete entirety of activity this date with mod A to identify errors and supervision A to scan L to R. Patient benefitted from utilizing a peice of paper to cover the remaining days of the week and check for one pills accuracy at a time. Upon completion, patient participated in picture search activity to emphasize use of L to R scanning. Patient required supervision A to identify each picture. SLP reviewed speech intelligibilty and memory strategies at the conclusion of the session in which patient recalled with minA. Patient left in [facility] with alarm set and call bell in reach. Continue POC  Pain Pain Assessment Pain Scale: 0-10 Pain Score: 0-No pain  Therapy/Group: Individual Therapy  Margrete Delude M.A., CF-SLP 04/20/2023, 12:20 PM

## 2023-04-20 NOTE — [place] FL2 (Addendum)
[place] MEDICAID FL2 LEVEL OF CARE FORM     IDENTIFICATION  Patient Name: Dale Mills Birthdate: Feb 17, 1966 Sex: male Admission Date (Current Location): 04/09/2023  [place] and [place] Number:  Haynes Bast [id number] M Facility and Address:  [facility], [address] [place], [place] [address]      Provider Number:    Attending Physician Name and Address:  Erick Colace, MD  Relative Name and Phone Number:  Lenny Pastel [phone]    Current Level of Care: Hospital Recommended Level of Care: Skilled Nursing Facility Prior Approval Number:    Date Approved/Denied:   PASRR Number:    Discharge Plan: SNF    Current Diagnoses: Patient Active Problem List   Diagnosis Date Noted   Pericardial effusion after operative procedure 04/14/2023   Shortness of breath 04/14/2023   Bilateral lower extremity edema 04/14/2023   Debility 04/10/2023   Acute ischemic left posterior cerebral artery (PCA) stroke (HCC) 04/09/2023   S/P aortic valve replacement and aortoplasty 03/31/2023   Protein-calorie malnutrition, severe 03/30/2023   Acute respiratory failure (HCC) 03/29/2023   Septic shock (HCC) 03/29/2023   Enterococcal bacteremia 03/29/2023   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   Iron deficiency anemia 03/20/2023   Coronary artery disease coronary CT angio did not show any major issues, suspicion for distal disease 06/12/2020   Angina pectoris (HCC) Canadian classification 2 04/10/2020   Precordial chest pain 02/13/2020   Essential hypertension 02/13/2020   Dyslipidemia 02/13/2020    Orientation RESPIRATION BLADDER Height & Weight     Self, Time, Situation, Place  Normal Continent (episodes of inct) Weight: 233 lb 7.5 oz (105.9 kg) Height:  6' (182.9 cm)  BEHAVIORAL SYMPTOMS/MOOD NEUROLOGICAL BOWEL NUTRITION STATUS      Continent Diet  AMBULATORY STATUS COMMUNICATION OF NEEDS Skin   Extensive Assist Verbally Other (Comment) (midline  incison, OTA)                       Personal Care Assistance Level of Assistance  Bathing, Dressing Bathing Assistance: Limited assistance   Dressing Assistance: Limited assistance     Functional Limitations Info             SPECIAL CARE FACTORS FREQUENCY  PT (By licensed PT), OT (By licensed OT), Speech therapy     PT Frequency: 5x a week OT Frequency: 5x a week     Speech Therapy Frequency: 5x a week      Contractures      Additional Factors Info  Code Status Code Status Info: FULL             Current Medications (04/20/2023):  This is the current hospital active medication list Current Facility-Administered Medications  Medication Dose Route Frequency Provider Last Rate Last Admin   (feeding supplement) PROSource Plus liquid 30 mL  30 mL Oral BID BM Love, Pamela S, PA-C   30 mL at 04/19/23 1240   acetaminophen (TYLENOL) tablet 325-650 mg  325-650 mg Oral Q4H PRN Love, Pamela S, PA-C       alum & mag hydroxide-simeth (MAALOX/MYLANTA) 200-200-20 MG/5ML suspension 30 mL  30 mL Oral Q4H PRN Love, Pamela S, PA-C       amiodarone (PACERONE) tablet 200 mg  200 mg Oral Daily Erick Colace, MD   200 mg at 04/20/23 0736   ampicillin (OMNIPEN) 2 g in sodium chloride 0.9 % 100 mL IVPB  2 g Intravenous Q4H Kirsteins, Victorino Sparrow, MD 300  mL/hr at 04/20/23 0643 2 g at 04/20/23 1610   ascorbic acid (VITAMIN C) tablet 500 mg  500 mg Oral Daily Jacquelynn Cree, PA-C   500 mg at 04/20/23 9604   aspirin EC tablet 81 mg  81 mg Oral Daily Jacquelynn Cree, PA-C   81 mg at 04/20/23 0736   atorvastatin (LIPITOR) tablet 80 mg  80 mg Oral Daily Jacquelynn Cree, PA-C   80 mg at 04/20/23 5409   bisacodyl (DULCOLAX) suppository 10 mg  10 mg Rectal Daily PRN Love, Pamela S, PA-C       cefTRIAXone (ROCEPHIN) 2 g in sodium chloride 0.9 % 100 mL IVPB  2 g Intravenous Q12H Erick Colace, MD 200 mL/hr at 04/20/23 0745 2 g at 04/20/23 0745   Chlorhexidine Gluconate Cloth 2 % PADS 6 each   6 each Topical Q12H Erick Colace, MD   6 each at 04/20/23 8119   diphenhydrAMINE (BENADRYL) capsule 25 mg  25 mg Oral Q6H PRN Love, Pamela S, PA-C       ezetimibe (ZETIA) tablet 10 mg  10 mg Oral Daily Jacquelynn Cree, PA-C   10 mg at 04/20/23 0736   Fe Fum-Vit C-Vit B12-FA (TRIGELS-F FORTE) capsule 1 capsule  1 capsule Oral QPC breakfast Jacquelynn Cree, PA-C   1 capsule at 04/20/23 0736   guaiFENesin-dextromethorphan (ROBITUSSIN DM) 100-10 MG/5ML syrup 5-10 mL  5-10 mL Oral Q6H PRN Jacquelynn Cree, PA-C   10 mL at 04/12/23 0323   insulin aspart (novoLOG) injection 2-6 Units  2-6 Units Subcutaneous TID AC & HS Jacquelynn Cree, PA-C   2 Units at 04/19/23 2241   insulin detemir (LEVEMIR) injection 6 Units  6 Units Subcutaneous Daily Fanny Dance, MD   6 Units at 04/20/23 0736   melatonin tablet 3 mg  3 mg Oral QHS PRN Fanny Dance, MD       metoprolol tartrate (LOPRESSOR) tablet 12.5 mg  12.5 mg Oral BID Love, Pamela S, PA-C   12.5 mg at 04/20/23 1478   nutrition supplement (JUVEN) (JUVEN) powder packet 1 packet  1 packet Oral BID BM Jacquelynn Cree, PA-C   1 packet at 04/19/23 1240   Oral care mouth rinse  15 mL Mouth Rinse 4 times per day Love, Pamela S, PA-C   15 mL at 04/20/23 2956   oxyCODONE (Oxy IR/ROXICODONE) immediate release tablet 5-10 mg  5-10 mg Oral Q3H PRN Love, Pamela S, PA-C       pantoprazole (PROTONIX) EC tablet 40 mg  40 mg Oral Daily Jacquelynn Cree, PA-C   40 mg at 04/20/23 0736   potassium chloride (KLOR-CON M) CR tablet 10 mEq  10 mEq Oral Daily Fanny Dance, MD   10 mEq at 04/20/23 2130   prochlorperazine (COMPAZINE) tablet 5-10 mg  5-10 mg Oral Q6H PRN Delle Reining S, PA-C       Or   prochlorperazine (COMPAZINE) suppository 12.5 mg  12.5 mg Rectal Q6H PRN Love, Pamela S, PA-C       Or   prochlorperazine (COMPAZINE) injection 5-10 mg  5-10 mg Intravenous Q6H PRN Love, Pamela S, PA-C   5 mg at 04/10/23 1026   protein supplement (ENSURE MAX) liquid  11 oz Oral  BID Jacquelynn Cree, PA-C   11 oz at 04/20/23 0742   sodium chloride flush (NS) 0.9 % injection 10-40 mL  10-40 mL Intracatheter Q12H Kirsteins, Victorino Sparrow, MD   10 mL at  04/18/23 2213   sodium chloride flush (NS) 0.9 % injection 10-40 mL  10-40 mL Intracatheter PRN Erick Colace, MD   10 mL at 04/18/23 0804   sodium phosphate (FLEET) enema 1 enema  1 enema Rectal Once PRN Love, Pamela S, PA-C       tamsulosin [facility]) capsule 0.4 mg  0.4 mg Oral QPC supper Erick Colace, MD   0.4 mg at 04/19/23 1722   torsemide (DEMADEX) tablet 20 mg  20 mg Oral Daily Jodelle Red, MD   20 mg at 04/20/23 0736   traMADol (ULTRAM) tablet 50-100 mg  50-100 mg Oral Q4H PRN Love, Pamela S, PA-C       traZODone (DESYREL) tablet 50 mg  50 mg Oral QHS Fanny Dance, MD   50 mg at 04/19/23 2016   Warfarin - Pharmacist Dosing Inpatient   Does not apply [id number] Jerene Pitch   Given at 04/19/23 1714     Discharge Medications: Please see discharge summary for a list of discharge medications.  Relevant Imaging Results:  Relevant Lab Results:   Additional Information SS: [id number]  Andria Rhein

## 2023-04-20 NOTE — Progress Notes (Signed)
PROGRESS NOTE   Subjective/Complaints: Working on endurance with therapy.  Family pursuing SNF versus assisted living   Review of Systems  Constitutional:  Negative for chills, fever and malaise/fatigue.  Eyes:  Positive for blurred vision.  Respiratory:  Negative for shortness of breath.   Cardiovascular:  Negative for chest pain.  Gastrointestinal:  Negative for diarrhea, nausea and vomiting.  Genitourinary: Negative.   Musculoskeletal:  Positive for joint pain.  Skin:  Negative for rash.  Neurological:  Positive for weakness.  Psychiatric/Behavioral:  The patient has insomnia.     Objective:   No results found. Recent Labs    04/20/23 0356  WBC 6.9  HGB 9.0*  HCT 29.2*  PLT 341   Recent Labs    04/20/23 0356  NA 137  K 3.7  CL 100  CO2 28  GLUCOSE 95  BUN 14  CREATININE 0.74  CALCIUM 9.3     Intake/Output Summary (Last 24 hours) at 04/20/2023 1037 Last data filed at 04/20/2023 0830 Gross per 24 hour  Intake 680 ml  Output 1250 ml  Net -570 ml        Physical Exam: Vital Signs Blood pressure 107/88, pulse 91, temperature 98.1 F (36.7 C), resp. rate 18, height 6' (1.829 m), weight 105.9 kg, SpO2 94%.   General: No acute distress Mood and affect are appropriate Heart: Regular rate and rhythm no rubs murmurs or extra sounds Lungs: Clear to auscultation, breathing unlabored, no rales or wheezes Abdomen: Positive bowel sounds, soft nontender to palpation, nondistended Extremities: No clubbing, cyanosis, or edema on right 1+ edema left   Skin: No evidence of breakdown, no evidence of rash, chest incision CDI Neurologic: Cranial nerves II through XII intact, motor strength is 5/5 in bilateral deltoid, bicep, tricep, grip, 4/5 hip flexor, knee extensors, ankle dorsiflexor and plantar flexor Neurological exam stable today 04/20/2023  Left visual field cut with neglect partially  compensated Musculoskeletal: Full range of motion in all 4 extremities. No joint swelling    Assessment/Plan: 1. Functional deficits which require 3+ hours per day of interdisciplinary therapy in a comprehensive inpatient rehab setting. Physiatrist is providing close team supervision and 24 hour management of active medical problems listed below. Physiatrist and rehab team continue to assess barriers to discharge/monitor patient progress toward functional and medical goals  Care Tool:  Bathing    Body parts bathed by patient: Left arm, Chest, Right arm, Abdomen, Front perineal area, Buttocks, Face, Right upper leg, Left upper leg   Body parts bathed by helper: Right lower leg, Left lower leg     Bathing assist Assist Level: Minimal Assistance - Patient > 75%     Upper Body Dressing/Undressing Upper body dressing   What is the patient wearing?: Pull over shirt    Upper body assist Assist Level: Supervision/Verbal cueing    Lower Body Dressing/Undressing Lower body dressing      What is the patient wearing?: Pants, Underwear/pull up     Lower body assist Assist for lower body dressing: Moderate Assistance - Patient 50 - 74%     Toileting Toileting    Toileting assist Assist for toileting: Minimal Assistance - Patient > 75%  Transfers Chair/bed transfer  Transfers assist     Chair/bed transfer assist level: Contact Guard/Touching assist     Locomotion Ambulation   Ambulation assist      Assist level: Contact Guard/Touching assist Assistive device: Walker-rolling Max distance: 50   Walk 10 feet activity   Assist     Assist level: Contact Guard/Touching assist Assistive device: Walker-rolling   Walk 50 feet activity   Assist Walk 50 feet with 2 turns activity did not occur: Safety/medical concerns  Assist level: Contact Guard/Touching assist Assistive device: Walker-rolling    Walk 150 feet activity   Assist Walk 150 feet activity  did not occur: Safety/medical concerns  Assist level: Contact Guard/Touching assist Assistive device: Walker-rolling    Walk 10 feet on uneven surface  activity   Assist Walk 10 feet on uneven surfaces activity did not occur: Safety/medical concerns         Wheelchair     Assist Is the patient using a wheelchair?: Yes Type of Wheelchair: Manual    Wheelchair assist level: Dependent - Patient 0%      Wheelchair 50 feet with 2 turns activity    Assist        Assist Level: Dependent - Patient 0%   Wheelchair 150 feet activity     Assist      Assist Level: Dependent - Patient 0%   Blood pressure 107/88, pulse 91, temperature 98.1 F (36.7 C), resp. rate 18, height 6' (1.829 m), weight 105.9 kg, SpO2 94%.   Medical Problem List and Plan: 1. 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             -patient may  shower             -ELOS/Goals: 10/15, PT/OT/SLP Sup  -Continue CIR therapies including PT, OT, and SLP , 2.  L-internal jugular DVT/ A fib/Antithrombotics: -DVT/anticoagulation:  Pharmaceutical:LUE cephalic and internal jugular DVT Coumadin and Heparin bridge to change to lovenox at discharge             -antiplatelet therapy: ASA 3. Pain Management:  oxycodone or tramadol prn for pain.  4. Mood/Behavior/Sleep: LCSW to follow for evaluation and support.              -antipsychotic agents: N/A             -- Order melatonin prn for insomnia.  Scheduled trazodone nightly   -reports reasonable sleep 5. Neuropsych/cognition: This patient is capable of making decisions on his own behalf. 6. Skin/Wound Care: Routine pressure relief measures.  7.  Fluids/Electrolytes/Nutrition: Strict I/O. Check CMET in am.  8. 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 Fluid overload/SOB: Lasix 40 mg decreased to daily on 09/25 --developed cough last nigh with SOB 09/27 pm.  --daily weights with monitoring for signs of overload. .  -wt stable, monitor Edema in legs will give IV lasix x 1 today.  CXR unchanged, cards to f/u Wts are stable and actually decreased today 10/5    Filed Weights   04/17/23 0500 04/19/23 0500 04/20/23 0500  Weight: 108.7 kg 105.8 kg 105.9 kg  Repeat IV lasix this am 10/4-appreciate cardiology consult  10/5 echo demonstrates large pericardial effusion, likely post-op -card recommended followng CRP and ESR--both are elevated. If they trend up then potentially colchicine rx.    -*iv lasix daily thru Monday then convert to torsemide 10/8 per cards   -*f/u crp and esr ordered for Monday   -no complaints today   -bp's holding Reduced edema on exam , no SOB, endurance in therapy improving -cont torsemide 20mg  qd 10. A fib: Continue on amiodarone 200 mg every day and coumadin 11. Pre diabetes/Stress induced hyperglycemia: Monitor BS ac/hs.  --Hgb A1C 6.2 (prediabetic). --diet changed to CM from regular. Continue Levemir 8 units daily with SSI prn for tighter BS control. .  --Transition insulin to metformin as intake improves.   -9/30 decrease levemir to 6 u CBG (last 3)  Recent Labs    04/19/23 1632 04/19/23 2117 04/20/23 0621  GLUCAP 115* 139* 107*  10/7 CBGs well controlled  12 ABLA:   Monitor for signs of bleeding             --transfuse prn <7.0 or if symptomatic. Slowly improving at 8.4  -9/30 hemoglobin stable at 7.9 yesterday  CBC    Component Value Date/Time   WBC 6.9 04/20/2023 0356   RBC 3.29 (L) 04/20/2023 0356   HGB 9.0 (L) 04/20/2023 0356   HCT 29.2 (L) 04/20/2023 0356    PLT 341 04/20/2023 0356   MCV 88.8 04/20/2023 0356   MCH 27.4 04/20/2023 0356   MCHC 30.8 04/20/2023 0356   RDW 17.2 (H) 04/20/2023 0356   10/6 hgb 7.6 10/4--f/u Monday--suspect at least part of fatigue is related to this  10/7 HGB up to 8.4 today Recheck Hgb 1 wk    13. Constipation: Resolved and now w/diarrhea. Will hold laxative.    -9/30 frequent BM not liquid, continue to hold laxatives  14. Hyponatremia,mild  -10/7 Na stable at 137  15.  Hypo K+   -KCL currently daily repeat BMET on Monday or as per cardiology  -10/7 K+ up to 4.0, improved will decrease to daily, recheck thursday  LOS: 11 days A FACE TO FACE EVALUATION WAS PERFORMED  Erick Colace 04/20/2023, 10:37 AM

## 2023-04-20 NOTE — Progress Notes (Signed)
PHARMACY - ANTICOAGULATION CONSULT NOTE  Pharmacy Consult for Warfarin Indication: atrial fibrillation  No Known Allergies  Patient Measurements: Height: 6' (182.9 cm) Weight: 105.9 kg (233 lb 7.5 oz) IBW/kg (Calculated) : 77.6  Vital Signs: Temp: 98.2 F (36.8 C) (10/10 1356) BP: 104/75 (10/10 1356) Pulse Rate: 95 (10/10 1356)  Labs: Recent Labs    04/18/23 0350 04/20/23 0356  HGB  --  9.0*  HCT  --  29.2*  PLT  --  341  LABPROT 29.0* 26.6*  INR 2.7* 2.4*  CREATININE  --  0.74    Estimated Creatinine Clearance: 128.1 mL/min (by C-G formula based on SCr of 0.74 mg/dL).   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 Having recurrent Afib postop, pharmacy consulted to start warfarin for Afib on 9/24. On amiodarone and ampicillin which may increase sensitivity to warfarin.  Acute DVT noted in the left internal jugular and a superficial thrombosis of left upper arm per duplex 9/26. Lovenox 40 mg daily 9/22>>9/24 > IV Heparin on 9/26 > therapeutic Lovenox 9/27>>10/1, when INR therapeutic x 3 consecutive days.   INR remains therapeutic but 2.7 > 2.4. No warfarin given 10/9, though intended 3 mg daily. Order had 1 day stop time which I did not notice on 10/8.  CBC stable. Amiodarone 400 mg BID > 200 mg daily on 10/4.  Goal of Therapy:  INR 2-3 Monitor platelets by anticoagulation protocol: Yes   Plan:  Warfarin 4 mg today and daily for now. Will check PT/INR in am to be sure not trending down further. Standing order for PT/INR Mondays and Thursdays CBC on Mondays and Thursdays with bmet.  Dennie Fetters, RPh 04/20/2023,2:10 PM

## 2023-04-20 NOTE — Patient Care Conference (Signed)
Inpatient RehabilitationTeam Conference and Plan of Care Update Date: 04/19/2023   Time: 1043 am   Patient Name: Dale Mills      Medical Record Number: [id number]  Date of Birth: May 12, 1966 Sex: Male         Room/Bed: 4W10C/4W10C-01 Payor Info: Payor: [place] MEDICAID PREPAID HEALTH PLAN / Plan: [place] MEDICAID HEALTHY BLUE / Product Type: *No Product type* /    Admit Date/Time:  04/09/2023  3:50 PM  Primary Diagnosis:  Debility  Hospital Problems: Principal Problem:   Debility Active Problems:   Endocarditis of mitral valve   Acute ischemic left posterior cerebral artery (PCA) stroke (HCC)   Pericardial effusion after operative procedure   Shortness of breath   Bilateral lower extremity edema    Expected Discharge Date: Expected Discharge Date: 04/25/23  Team Members Present: Physician leading conference: Dr. Claudette Laws Social Worker Present: Lavera Guise, BSW Nurse Present: Chana Bode, RN PT Present: Casimiro Needle, PT OT Present: Bonnell Public, OT PPS Coordinator present : Edson Snowball, PT     Current Status/Progress Goal Weekly Team Focus  Bowel/Bladder   Pt is continent of bowel/bladder   Pt to remain continent of bowel/bladder   Will assist patient with toileting needs    Swallow/Nutrition/ Hydration               ADL's   supervision UB BADLs, min A LB BADLs, min A toileting, min A ambualtory toilet transfers using RW; barriers: decreased activity tolerance (although improving), functional cognition, L visual field cut   UB BADLs supervision, LB BADLs/functional transfers downgraded to CGA   activity tolerance, BADL retraining, Pt education, fucntional mobility, standing balance, fucntional cog    Mobility   minA bed mobility, mina STS w/ multiple attempts, minA bed to chair using RW, ambulating 167 ft minA RW & 70 ft modA B HHA --- L inattention d/t visual field cut, elevated HR & low BP during ambulation d/t decreased aerobic endurance.    supervision/CGA ambulatory level  endurance, LE strength, dynamic balance, functional mobility, ambulation    Communication   ~100% intelligibile at conversational level with min A   modiA   education and use of compensatory strategies    Safety/Cognition/ Behavioral Observations  able to compensate for L inattention during problem solving tasks, required minA for memory and problem solving tasks   supervision   L inattention strategies, memory strategies, mildly complex problem solving tasks    Pain   Pt denies pain   Pt will remain pain free   Will assess qshift and PRN    Skin   Pt has a midline incision OTA   Pt's midline incision will stay intact  Will assess qshift and PRN      Discharge Planning:  Patient Medicaid approved. Pt d/c to SNF/ALF. Dtr unable to care for patient.   Team Discussion: Patient post endocarditis with debility with IV antibiotic through 05/12/2023. Limited by poor activity tolerance, left side inattention. Although attention to left side is improving along with ability to compensate. Working on [employer] and problem solving.  Patient on target to meet rehab goals: yes, Currently need supervision for upper body care, and min assist for lower body ADLs and toileting. Needs CGA for sit-stand and able to ambulate up to 200 ft using rolling walker with CGA; needs min assist when fatigued. Goals for discharge set for supervision to CGA overall.   *See Care Plan and progress notes for long and short-term goals.   Revisions to  Treatment Plan:  N/A  Teaching Needs: Safety, medications, transfers, toileting, etc...   Current Barriers to Discharge: Decreased caregiver support and IV antibiotics  Possible Resolutions to Barriers: Family education  HH follow up services vs SNF DME-Transport chair     Medical Summary Current Status: fluid overload managed ,exercise tolerance  Barriers to Discharge: Medical stability;Hypotension   Possible Resolutions  to [employer] Focus: Cardiology assisting with fluid overload       I attest that I was present, lead the team conference, and concur with the assessment and plan of the team.   Felisa Bonier [name] 04/20/2023, 10:18 AM

## 2023-04-20 NOTE — Progress Notes (Deleted)
[address]       [name] [address]             [phone]    HPI: [name] is a 57 yo male with known history of Non-obstructive CAD, HLD, HTN, recurrent UTI/Pylonephritis infections, and recent hip replacement surgery (10/2022). 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 The patient returns for routine postoperative follow-up having undergone mitral valve replacement with a 31 mm Mosaic Porcine valve and aortic root replacement ([place]) with a 27mm Connect Pericardial valve conduit by Dr. Leafy Ro on 03/31/23. The patient's early postoperative recovery while in the hospital was notable for a left internal jugular DVT and left superficial cephalic vein thrombosis. He was started on Coumadin and discharged with an INR of 1.9. He was also discharged on IV Ampicillin and IV Ceftriaxone that were started on 09/18 and will be discontinued on 11/01 for E. Faecalis endocarditis and septic emboli. He was discharged in stable condition to CIR on 09/27.  Since hospital discharge the patient reports ***.   No current facility-administered medications for this visit.   No current outpatient medications on file.   Facility-Administered Medications Ordered in Other Visits  Medication Dose Route Frequency Provider Last Rate Last Admin   (feeding supplement) PROSource Plus liquid 30 mL  30 mL Oral BID BM Love, Pamela S, PA-C   30 mL at 04/20/23 1203   acetaminophen (TYLENOL) tablet 325-650 mg  325-650 mg Oral Q4H PRN Love, Pamela S, PA-C       alum & mag hydroxide-simeth (MAALOX/MYLANTA) 200-200-20 MG/5ML suspension 30 mL  30 mL Oral Q4H PRN Love, Pamela S,  PA-C       amiodarone (PACERONE) tablet 200 mg  200 mg Oral Daily Kirsteins, Victorino Sparrow, MD   200 mg at 04/20/23 0736   ampicillin (OMNIPEN) 2 g in sodium chloride 0.9 % 100 mL IVPB  2 g Intravenous Q4H Erick Colace, MD 300 mL/hr at 04/20/23 1212 2 g at 04/20/23 1212   ascorbic acid (VITAMIN C) tablet 500 mg  500 mg Oral Daily Jacquelynn Cree, PA-C   500 mg at 04/20/23 1308   aspirin EC tablet 81 mg  81 mg Oral Daily Jacquelynn Cree, PA-C   81 mg at 04/20/23 0736   atorvastatin (LIPITOR) tablet 80 mg  80 mg Oral Daily Jacquelynn Cree, PA-C   80 mg at 04/20/23 6578   bisacodyl (DULCOLAX) suppository 10 mg  10 mg Rectal Daily PRN Love, Pamela S, PA-C       cefTRIAXone (ROCEPHIN) 2 g in sodium chloride 0.9 % 100 mL IVPB  2 g Intravenous Q12H Erick Colace, MD 200 mL/hr at 04/20/23 0745 2 g at 04/20/23 0745   Chlorhexidine Gluconate Cloth 2 % PADS 6 each  6 each Topical Q12H Erick Colace, MD   6 each at 04/20/23 4696   diphenhydrAMINE (BENADRYL) capsule 25 mg  25 mg Oral Q6H PRN Love, Pamela S, PA-C       ezetimibe (ZETIA) tablet 10 mg  10 mg Oral Daily Delle Reining S, PA-C   10 mg at 04/20/23 0736   Fe Fum-Vit C-Vit B12-FA (TRIGELS-F FORTE) capsule 1 capsule  1 capsule  Oral QPC breakfast Jacquelynn Cree, [place]   1 capsule at 04/20/23 0736   guaiFENesin-dextromethorphan (ROBITUSSIN DM) 100-10 MG/5ML syrup 5-10 mL  5-10 mL Oral Q6H PRN Jacquelynn Cree, PA-C   10 mL at 04/12/23 0323   insulin aspart (novoLOG) injection 2-6 Units  2-6 Units Subcutaneous TID AC & HS Jacquelynn Cree, PA-C   2 Units at 04/19/23 2241   insulin detemir (LEVEMIR) injection 6 Units  6 Units Subcutaneous Daily Fanny Dance, MD   6 Units at 04/20/23 0736   melatonin tablet 3 mg  3 mg Oral QHS PRN Fanny Dance, MD       metoprolol tartrate (LOPRESSOR) tablet 12.5 mg  12.5 mg Oral BID Love, Pamela S, PA-C   12.5 mg at 04/20/23 5366   nutrition supplement (JUVEN) (JUVEN) powder packet 1 packet  1 packet Oral BID  BM Jacquelynn Cree, PA-C   1 packet at 04/20/23 1203   Oral care mouth rinse  15 mL Mouth Rinse 4 times per day Love, Pamela S, PA-C   15 mL at 04/20/23 1203   oxyCODONE (Oxy IR/ROXICODONE) immediate release tablet 5-10 mg  5-10 mg Oral Q3H PRN Love, Pamela S, PA-C       pantoprazole (PROTONIX) EC tablet 40 mg  40 mg Oral Daily Jacquelynn Cree, PA-C   40 mg at 04/20/23 0736   potassium chloride (KLOR-CON M) CR tablet 10 mEq  10 mEq Oral Daily Fanny Dance, MD   10 mEq at 04/20/23 4403   prochlorperazine (COMPAZINE) tablet 5-10 mg  5-10 mg Oral Q6H PRN Delle Reining S, PA-C       Or   prochlorperazine (COMPAZINE) suppository 12.5 mg  12.5 mg Rectal Q6H PRN Love, Pamela S, PA-C       Or   prochlorperazine (COMPAZINE) injection 5-10 mg  5-10 mg Intravenous Q6H PRN Love, Pamela S, PA-C   5 mg at 04/10/23 1026   protein supplement (ENSURE MAX) liquid  11 oz Oral BID Jacquelynn Cree, PA-C   11 oz at 04/20/23 4742   sodium chloride flush (NS) 0.9 % injection 10-40 mL  10-40 mL Intracatheter Q12H Kirsteins, Victorino Sparrow, MD   10 mL at 04/18/23 2213   sodium chloride flush (NS) 0.9 % injection 10-40 mL  10-40 mL Intracatheter PRN Erick Colace, MD   10 mL at 04/18/23 0804   sodium phosphate (FLEET) enema 1 enema  1 enema Rectal Once PRN Love, Pamela S, PA-C       tamsulosin [facility]) capsule 0.4 mg  0.4 mg Oral QPC supper Erick Colace, MD   0.4 mg at 04/19/23 1722   torsemide (DEMADEX) tablet 20 mg  20 mg Oral Daily Jodelle Red, MD   20 mg at 04/20/23 0736   traMADol (ULTRAM) tablet 50-100 mg  50-100 mg Oral Q4H PRN Love, Pamela S, PA-C       traZODone (DESYREL) tablet 50 mg  50 mg Oral QHS Fanny Dance, MD   50 mg at 04/19/23 2016   Warfarin - Pharmacist Dosing Inpatient   Does not apply [id number] Jacquelynn Cree, PA-C   Given at 04/19/23 1714   Vitals:  Physical Exam: *** General: Neuro CV Pulm GI Extremities Wounds:  Diagnostic  Tests: ***  Impression/Plan ***   Dale Reichmann, PA-C [facility] [phone]

## 2023-04-20 NOTE — Progress Notes (Signed)
Occupational Therapy Session Note  Patient Details  Name: Dale Mills MRN: [id number] Date of Birth: Feb 18, 1966  Today's Date: 04/20/2023 OT Individual Time: 1345-1430 OT Individual Time Calculation (min): 45 min    Short Term Goals: Week 2:  OT Short Term Goal 1 (Week 2): STG=LTGs d/t Pt ELOS  Skilled Therapeutic Interventions/Progress Updates:    Pt resting in recliner upon arrival. Pt reported that he needs an alarm in his seat. Seat alarm acquired. Sit<>stand with CGA to facilitate placement of seat alarm. Pt presented with small peg board pattern and requested to replicate on peg board. Pt required 30 mins to complete task with mod verbal cues to self correct. Pt discovered errors x 3 but required verbal cues to correct. Pt reported that it was easier then he thought it would be. Pt reported that task required "a lot of concentration." Pt did not report that his visual deficits were a challenge. Pt remained seated in relciner with seat alarm activated.   Therapy Documentation Precautions:  Precautions Precautions: Sternal, Fall Precaution Comments: Verbal education provided during eval. Pt able to verbalize he "doesn't need to open up the incision w/ movement" Restrictions Weight Bearing Restrictions: No RUE Weight Bearing: Weight bearing as tolerated LUE Weight Bearing: Weight bearing as tolerated Other Position/Activity Restrictions: sternal precaution Pain:  Pt denies pain this afternoon  Therapy/Group: Individual Therapy  Rich Brave 04/20/2023, 2:38 PM

## 2023-04-21 ENCOUNTER — Other Ambulatory Visit: Payer: Self-pay | Admitting: Thoracic Surgery (Cardiothoracic Vascular Surgery)

## 2023-04-21 DIAGNOSIS — Z952 Presence of prosthetic heart valve: Secondary | ICD-10-CM

## 2023-04-21 LAB — GLUCOSE, CAPILLARY
Glucose-Capillary: 101 mg/dL — ABNORMAL HIGH (ref 70–99)
Glucose-Capillary: 98 mg/dL (ref 70–99)
Glucose-Capillary: 100 mg/dL — ABNORMAL HIGH (ref 70–99)
Glucose-Capillary: 156 mg/dL — ABNORMAL HIGH (ref 70–99)

## 2023-04-21 LAB — PROTIME-INR
INR: 2.3 — ABNORMAL HIGH (ref 0.8–1.2)
Prothrombin Time: 25.7 s — ABNORMAL HIGH (ref 11.4–15.2)

## 2023-04-21 NOTE — Progress Notes (Signed)
PHARMACY - ANTICOAGULATION CONSULT NOTE  Pharmacy Consult for Warfarin Indication: atrial fibrillation  No Known Allergies  Patient Measurements: Height: 6' (182.9 cm) Weight: 105.8 kg (233 lb 4 oz) IBW/kg (Calculated) : 77.6  Vital Signs: Temp: 97.6 F (36.4 C) (10/11 1243) BP: 116/75 (10/11 1243) Pulse Rate: 89 (10/11 1243)  Labs: Recent Labs    04/20/23 0356 04/21/23 0317  HGB 9.0*  --   HCT 29.2*  --   PLT 341  --   LABPROT 26.6* 25.7*  INR 2.4* 2.3*  CREATININE 0.74  --     Estimated Creatinine Clearance: 128.1 mL/min (by C-G formula based on SCr of 0.74 mg/dL).   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 Having recurrent Afib postop, pharmacy consulted to start warfarin for Afib on 9/24. On amiodarone and ampicillin which may increase sensitivity to warfarin.  Acute DVT noted in the left internal jugular and a superficial thrombosis of left upper arm per duplex 9/26. Lovenox 40 mg daily 9/22>>9/24 > IV Heparin on 9/26 > therapeutic Lovenox 9/27>>10/1, when INR therapeutic x 3 consecutive days.   INR remains therapeutic at 2.3. Trended down from 2.7 >2.4. No warfarin given 10/9, though intended 3 mg daily. Order had 1 day stop time which I did not notice on 10/8. Resumed with 4 mmg daily on 10/10. CBC stable. Amiodarone 400 mg BID > 200 mg daily on 10/4.  Goal of Therapy:  INR 2-3 Monitor platelets by anticoagulation protocol: Yes   Plan:  Warfarin 4 mg daily. Next PT/INR on 10/13. Standing order for PT/INR Mondays and Thursdays CBC on Mondays and Thursdays with bmet.  Dennie Fetters, RPh 04/21/2023,1:19 PM

## 2023-04-21 NOTE — Progress Notes (Addendum)
Patient ID: Dale Mills, male   DOB: September 07, 1965, 57 y.o.   MRN: [id number]  Sw made attempt to reach [facility] AD. Left detailed VM for AD, Denny Peon. Sw will wait for FU.   2:31 PM SW reached FU from AD, Selena Batten and reports referral was accidentally accepted. Facility not in network with pt's payer source. Pt declined.    2:40 PM: Sw informed daughters of the bed declines and barriers of age and insurance of obtaining bed offers. Due to being unable to obtain placement. Daughters have agreed on a d/c home with family (brothers, nephews, dtrs, etc) to provide 24/7 supervision. Sw will update the team.   SW informed daughters the benefits of switching to traditional Medicaid of [place].

## 2023-04-21 NOTE — Progress Notes (Signed)
Speech Language Pathology Daily Session Note  Patient Details  Name: Dale Mills MRN: [id number] Date of Birth: 23-Mar-1966  Today's Date: 04/21/2023 SLP Individual Time: 0900-1000 SLP Individual Time Calculation (min): 60 min  Short Term Goals: Week 2: SLP Short Term Goal 1 (Week 2): Patient will attend to left visual field during functional tasks with Mod I verbal cues SLP Short Term Goal 2 (Week 2): Patient will recall novel information given a 10- 15 minute delay with 90% acc given min A SLP Short Term Goal 3 (Week 2): Patient will complete moderately complex problem solving with 90% acc with sup A SLP Short Term Goal 4 (Week 2): Patient will utilize speech intelligibility strategies at the conversation level to achieve 100% intelligibility with Mod I verbal cues.  Skilled Therapeutic Interventions: Skilled therapy session focused on cognitive skills. SLP faciliated session through providing supervision A during reading comprehension task. SLP read aloud moderately complex and complex reading passages and pt answered questions with 90% accuracy given supervisionA. To continue addressing cognition, patient completed cross word task. Patient benefitted from minA to utilize word bank and number of spaces to fill in puzzle. Patient with occasional difficulty to space letters into boxes, however aware. Patient continent of bladder during session. Patient left in chair with alarm set and call bell in reach. Continue POC.   Pain No pain reported   Therapy/Group: Individual Therapy  Finn Altemose M.A., CF-SLP 04/21/2023, 9:55 AM

## 2023-04-21 NOTE — Progress Notes (Addendum)
Patient ID: Dale Mills, male   DOB: 1966-01-20, 57 y.o.   MRN: [id number]  Sw called daughter, [name] to discuss current bed approval at [facility], [place],   Daughter would like to discuss bed offer with her sister and then follow up with SW.    *SNF referral expanded to [place].    11:06 AM: Daughters agreeable to [facility]. SW will discuss with patient.

## 2023-04-21 NOTE — Progress Notes (Signed)
PROGRESS NOTE   Subjective/Complaints: Pt aware that he is not going home post discharge    Review of Systems  Constitutional:  Negative for chills, fever and malaise/fatigue.  Eyes:  Positive for blurred vision.  Respiratory:  Negative for shortness of breath.   Cardiovascular:  Negative for chest pain.  Gastrointestinal:  Negative for diarrhea, nausea and vomiting.  Genitourinary: Negative.   Musculoskeletal:  Positive for joint pain.  Skin:  Negative for rash.  Neurological:  Positive for weakness.  Psychiatric/Behavioral:  The patient has insomnia.     Objective:   No results found. Recent Labs    04/20/23 0356  WBC 6.9  HGB 9.0*  HCT 29.2*  PLT 341   Recent Labs    04/20/23 0356  NA 137  K 3.7  CL 100  CO2 28  GLUCOSE 95  BUN 14  CREATININE 0.74  CALCIUM 9.3     Intake/Output Summary (Last 24 hours) at 04/21/2023 0844 Last data filed at 04/21/2023 0720 Gross per 24 hour  Intake 920 ml  Output 1475 ml  Net -555 ml        Physical Exam: Vital Signs Blood pressure 112/82, pulse 89, temperature 97.6 F (36.4 C), resp. rate 19, height 6' (1.829 m), weight 105.8 kg, SpO2 96%.   General: No acute distress Mood and affect are appropriate Heart: Regular rate and rhythm no rubs murmurs or extra sounds Lungs: Clear to auscultation, breathing unlabored, no rales or wheezes Abdomen: Positive bowel sounds, soft nontender to palpation, nondistended Extremities: No clubbing, cyanosis, or edema on right 1+ edema left   Skin: No evidence of breakdown, no evidence of rash, chest incision CDI Neurologic: Cranial nerves II through XII intact, motor strength is 5/5 in bilateral deltoid, bicep, tricep, grip, 4/5 hip flexor, knee extensors, ankle dorsiflexor and plantar flexor Neurological exam stable today 04/21/2023  Left visual field cut with neglect partially compensated Musculoskeletal: Full range of  motion in all 4 extremities. No joint swelling    Assessment/Plan: 1. Functional deficits which require 3+ hours per day of interdisciplinary therapy in a comprehensive inpatient rehab setting. Physiatrist is providing close team supervision and 24 hour management of active medical problems listed below. Physiatrist and rehab team continue to assess barriers to discharge/monitor patient progress toward functional and medical goals  Care Tool:  Bathing    Body parts bathed by patient: Left arm, Chest, Right arm, Abdomen, Front perineal area, Buttocks, Face, Right upper leg, Left upper leg   Body parts bathed by helper: Right lower leg, Left lower leg     Bathing assist Assist Level: Minimal Assistance - Patient > 75%     Upper Body Dressing/Undressing Upper body dressing   What is the patient wearing?: Pull over shirt    Upper body assist Assist Level: Supervision/Verbal cueing    Lower Body Dressing/Undressing Lower body dressing      What is the patient wearing?: Pants, Underwear/pull up     Lower body assist Assist for lower body dressing: Moderate Assistance - Patient 50 - 74%     Toileting Toileting    Toileting assist Assist for toileting: Minimal Assistance - Patient > 75%  Transfers Chair/bed transfer  Transfers assist     Chair/bed transfer assist level: Contact Guard/Touching assist     Locomotion Ambulation   Ambulation assist      Assist level: Minimal Assistance - Patient > 75% Assistive device: Walker-rolling Max distance: 170   Walk 10 feet activity   Assist     Assist level: Minimal Assistance - Patient > 75% Assistive device: Walker-rolling   Walk 50 feet activity   Assist Walk 50 feet with 2 turns activity did not occur: Safety/medical concerns  Assist level: Minimal Assistance - Patient > 75% Assistive device: Walker-rolling    Walk 150 feet activity   Assist Walk 150 feet activity did not occur: Safety/medical  concerns  Assist level: Minimal Assistance - Patient > 75% Assistive device: Walker-rolling    Walk 10 feet on uneven surface  activity   Assist Walk 10 feet on uneven surfaces activity did not occur: Safety/medical concerns   Assist level: Minimal Assistance - Patient > 75% Assistive device: Walker-rolling   Wheelchair     Assist Is the patient using a wheelchair?: Yes Type of Wheelchair: Manual    Wheelchair assist level: Dependent - Patient 0%      Wheelchair 50 feet with 2 turns activity    Assist        Assist Level: Dependent - Patient 0%   Wheelchair 150 feet activity     Assist      Assist Level: Dependent - Patient 0%   Blood pressure 112/82, pulse 89, temperature 97.6 F (36.4 C), resp. rate 19, height 6' (1.829 m), weight 105.8 kg, SpO2 96%.   Medical Problem List and Plan: 1. 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             -patient may  shower             -ELOS/Goals: 10/15, PT/OT/SLP Sup  -Continue CIR therapies including PT, OT, and SLP , 2.  L-internal jugular DVT/ A fib/Antithrombotics: -DVT/anticoagulation:  Pharmaceutical:LUE cephalic and internal jugular DVT Coumadin and Heparin bridge to change to lovenox at discharge             -antiplatelet therapy: ASA 3. Pain Management:  oxycodone or tramadol prn for pain.  4. Mood/Behavior/Sleep: LCSW to follow for evaluation and support.              -antipsychotic agents: N/A             -- Order melatonin prn for insomnia.  Scheduled trazodone nightly   -reports reasonable sleep 5. Neuropsych/cognition: This patient is capable of making decisions on his own behalf. 6. Skin/Wound  Care: Routine pressure relief measures.  7. Fluids/Electrolytes/Nutrition: Strict I/O. Check CMET in am.  8. 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 Fluid overload/SOB: Lasix 40 mg decreased to daily on 09/25 --developed cough last nigh with SOB 09/27 pm.  --daily weights with monitoring for signs of overload. .  -wt stable, monitor Edema in legs will give IV lasix x 1 today.  CXR unchanged, cards to f/u Wts are stable  and actually decreased today 10/5    Filed Weights   04/19/23 0500 04/20/23 0500 04/21/23 0521  Weight: 105.8 kg 105.9 kg 105.8 kg  Repeat IV lasix this am 10/4-appreciate cardiology consult  10/5 echo demonstrates large pericardial effusion, likely post-op -card recommended followng CRP and ESR--both are elevated. If they trend up then potentially colchicine rx.    -*iv lasix daily thru Monday then convert to torsemide 10/8 per cards   -*f/u crp and esr ordered for Monday   -no complaints today   -bp's holding Reduced edema on exam , no SOB, endurance in therapy improving -cont torsemide 20mg  qd 10. A fib: Continue on amiodarone 200 mg every day and coumadin 11. Pre diabetes/Stress induced hyperglycemia: Monitor BS ac/hs.  --Hgb A1C 6.2 (prediabetic). --diet changed to CM from regular. Continue Levemir 8 units daily with SSI prn for tighter BS control. .  --Transition insulin to metformin as intake improves.   -9/30 decrease levemir to 6 u CBG (last 3)  Recent Labs    04/20/23 1636 04/20/23 2046 04/21/23 0611  GLUCAP 109* 117* 101*  10/11 controlled   12 ABLA:   Monitor for signs of bleeding             --transfuse prn <7.0 or if symptomatic. Slowly improving at 8.4  Improving   CBC    Component Value Date/Time   WBC 6.9 04/20/2023 0356   RBC 3.29 (L) 04/20/2023 0356   HGB 9.0 (L) 04/20/2023 0356   HCT 29.2 (L)  04/20/2023 0356   PLT 341 04/20/2023 0356   MCV 88.8 04/20/2023 0356   MCH 27.4 04/20/2023 0356   MCHC 30.8 04/20/2023 0356   RDW 17.2 (H) 04/20/2023 0356    Recheck Hgb 1 wk    13. Constipation: Resolved and now w/diarrhea. Will hold laxative.    -9/30 frequent BM not liquid, continue to hold laxatives  14. Hyponatremia,mild  -10/7 Na stable at 137  15.  Hypo K+   -KCL currently daily repeat BMET on Monday or as per cardiology  -10/7 K+ up to 4.0, improved will decrease to daily, recheck 3.7 cont KCL daily   LOS: 12 days A FACE TO FACE EVALUATION WAS PERFORMED  Erick Colace 04/21/2023, 8:44 AM

## 2023-04-21 NOTE — Progress Notes (Signed)
Physical Therapy Session Note  Patient Details  Name: Dale Mills MRN: [id number] Date of Birth: [birth year]  Today's Date: 04/21/2023 PT Individual Time: 4782-9562 PT Individual Time Calculation (min): 74 min   Short Term Goals: Week 2:  PT Short Term Goal 1 (Week 2): = LTG's  Skilled Therapeutic Interventions/Progress Updates: Patient sitting in recliner on entrance to room. Patient alert and agreeable to PT session.   Patient reported no pain at beginning of PT session, only some fatigue.  Therapeutic Activity: Transfers: Pt performed sit<>stands and transfers throughout session with supervision. Pt with B UE's crossed along chest and stands with no issue on first try.   Gait Training:  Pt transported outside of [facility] in [facility] with PTA managing IV pole. Pt ambulated 200'+ using RW with CGA/close supervision. Pt outside of [facility] starting at bottom of slight concrete hill to practice ascending/descending on noncompliant surfaces. Pt provided with instructional cues to increase step height on L LE due to incline as pt tends to present with decrease clearance on L LE vs R. Pt ambulated up and down without rest (pt stated that he could walk further, but ambulatory intervention was limited due to PTA having to manage IV pole without WC follow). Pt required min cues to visually scan orientation of self on sidewalk per presentation of drifting to the L close to the curb. Pt demos safe navigation of rocks, mulch and cracks on sidewalk without LOB.  Therapeutic Exercise: Pt performed the following exercise in order to increase cardiovascular endurance. Pt on nustep per request at end of session on level 7. Pt maintained 45-50 steps per minute for 10 minutes and no rest break or reports of SOB. 421 steps performed and HR 89 bpm/SpO2 100%  Patient in WC at end of session with brakes locked, belt alarm set, and all needs within reach.      Therapy Documentation Precautions:   Precautions Precautions: Sternal, Fall Precaution Comments: Verbal education provided during eval. Pt able to verbalize he "doesn't need to open up the incision w/ movement" Restrictions Weight Bearing Restrictions: No RUE Weight Bearing: Weight bearing as tolerated LUE Weight Bearing: Weight bearing as tolerated Other Position/Activity Restrictions: sternal precaution  Therapy/Group: Individual Therapy  Jasey Cortez PTA 04/21/2023, 12:08 PM

## 2023-04-21 NOTE — Progress Notes (Signed)
Occupational Therapy Session Note  Patient Details  Name: Dale Mills MRN: [id number] Date of Birth: 1966/06/20  Today's Date: 04/21/2023 OT Individual Time: 1420-1445 OT Individual Time Calculation (min): 25 min  and Today's Date: 04/21/2023 OT Missed Time: 20 Minutes Missed Time Reason: Unavailable (comment) (Pt with evaluator from the State for SNF application)   Short Term Goals: Week 2:  OT Short Term Goal 1 (Week 2): STG=LTGs d/t Pt ELOS  Skilled Therapeutic Interventions/Progress Updates:    Pt greeted with someone from the state present to assess SNF needs and missed 20 minutes of OT treatment session. OT returned and pt agreeable to treatment session focused on functional strength/endurance on NuStep. Pt brought to therapy gym in wc for time management. Pt with good recall of sternal precautions to rock and power through legs to stand, then CGA to ambulate to the NuStep w/ RW. Pt completed 13 minutes on NuStep on level 7. Pt returned to room and was left seated in wc with alarm on, call bell in reach, and needs met.   Therapy Documentation Precautions:  Precautions Precautions: Sternal, Fall Precaution Comments: Verbal education provided during eval. Pt able to verbalize he "doesn't need to open up the incision w/ movement" Restrictions Weight Bearing Restrictions: No RUE Weight Bearing: Weight bearing as tolerated LUE Weight Bearing: Weight bearing as tolerated Other Position/Activity Restrictions: sternal precaution Pain:  Pt denies pain  Therapy/Group: Individual Therapy  Mal Amabile 04/21/2023, 2:25 PM

## 2023-04-22 ENCOUNTER — Inpatient Hospital Stay (HOSPITAL_COMMUNITY): Payer: Medicaid Other

## 2023-04-22 DIAGNOSIS — I69318 Other symptoms and signs involving cognitive functions following cerebral infarction: Secondary | ICD-10-CM

## 2023-04-22 DIAGNOSIS — H538 Other visual disturbances: Secondary | ICD-10-CM

## 2023-04-22 LAB — GLUCOSE, CAPILLARY
Glucose-Capillary: 84 mg/dL (ref 70–99)
Glucose-Capillary: 107 mg/dL — ABNORMAL HIGH (ref 70–99)
Glucose-Capillary: 134 mg/dL — ABNORMAL HIGH (ref 70–99)
Glucose-Capillary: 98 mg/dL (ref 70–99)

## 2023-04-22 NOTE — Progress Notes (Signed)
PROGRESS NOTE   Subjective/Complaints: No events overnight. Vitals stable Last BM 10/10    Review of Systems  Constitutional:  Negative for chills, fever and malaise/fatigue.  Eyes:  Positive for blurred vision.  Respiratory:  Negative for shortness of breath.   Cardiovascular:  Negative for chest pain.  Gastrointestinal:  Negative for diarrhea, nausea and vomiting.  Genitourinary: Negative.   Musculoskeletal:  Positive for joint pain.  Skin:  Negative for rash.  Neurological:  Positive for weakness.  Psychiatric/Behavioral:  The patient has insomnia.     Objective:   No results found. Recent Labs    04/20/23 0356  WBC 6.9  HGB 9.0*  HCT 29.2*  PLT 341   Recent Labs    04/20/23 0356  NA 137  K 3.7  CL 100  CO2 28  GLUCOSE 95  BUN 14  CREATININE 0.74  CALCIUM 9.3     Intake/Output Summary (Last 24 hours) at 04/22/2023 1030 Last data filed at 04/22/2023 8469 Gross per 24 hour  Intake 1160 ml  Output 2100 ml  Net -940 ml        Physical Exam: Vital Signs Blood pressure 110/79, pulse 86, temperature 97.8 F (36.6 C), resp. rate 18, height 6' (1.829 m), weight 108.1 kg, SpO2 99%.   General: No acute distress Mood and affect are appropriate Heart: Regular rate and rhythm no rubs murmurs or extra sounds Lungs: Clear to auscultation, breathing unlabored, no rales or wheezes Abdomen: Positive bowel sounds, soft nontender to palpation, nondistended Extremities: No clubbing, cyanosis, or edema on right 1+ edema left   Skin: No evidence of breakdown, no evidence of rash, chest incision CDI Neurologic: Cranial nerves II through XII intact, motor strength is 5/5 in bilateral deltoid, bicep, tricep, grip, 4/5 hip flexor, knee extensors, ankle dorsiflexor and plantar flexor Neurological exam stable today 04/22/2023  Left visual field cut with neglect partially compensated Musculoskeletal: Full range of  motion in all 4 extremities. No joint swelling    Assessment/Plan: 1. Functional deficits which require 3+ hours per day of interdisciplinary therapy in a comprehensive inpatient rehab setting. Physiatrist is providing close team supervision and 24 hour management of active medical problems listed below. Physiatrist and rehab team continue to assess barriers to discharge/monitor patient progress toward functional and medical goals  Care Tool:  Bathing    Body parts bathed by patient: Left arm, Chest, Right arm, Abdomen, Front perineal area, Buttocks, Face, Right upper leg, Left upper leg   Body parts bathed by helper: Right lower leg, Left lower leg     Bathing assist Assist Level: Minimal Assistance - Patient > 75%     Upper Body Dressing/Undressing Upper body dressing   What is the patient wearing?: Pull over shirt    Upper body assist Assist Level: Supervision/Verbal cueing    Lower Body Dressing/Undressing Lower body dressing      What is the patient wearing?: Pants, Underwear/pull up     Lower body assist Assist for lower body dressing: Moderate Assistance - Patient 50 - 74%     Toileting Toileting    Toileting assist Assist for toileting: Minimal Assistance - Patient > 75%  Transfers Chair/bed transfer  Transfers assist     Chair/bed transfer assist level: Contact Guard/Touching assist     Locomotion Ambulation   Ambulation assist      Assist level: Minimal Assistance - Patient > 75% Assistive device: Walker-rolling Max distance: 170   Walk 10 feet activity   Assist     Assist level: Minimal Assistance - Patient > 75% Assistive device: Walker-rolling   Walk 50 feet activity   Assist Walk 50 feet with 2 turns activity did not occur: Safety/medical concerns  Assist level: Minimal Assistance - Patient > 75% Assistive device: Walker-rolling    Walk 150 feet activity   Assist Walk 150 feet activity did not occur: Safety/medical  concerns  Assist level: Minimal Assistance - Patient > 75% Assistive device: Walker-rolling    Walk 10 feet on uneven surface  activity   Assist Walk 10 feet on uneven surfaces activity did not occur: Safety/medical concerns   Assist level: Minimal Assistance - Patient > 75% Assistive device: Walker-rolling   Wheelchair     Assist Is the patient using a wheelchair?: Yes Type of Wheelchair: Manual    Wheelchair assist level: Dependent - Patient 0%      Wheelchair 50 feet with 2 turns activity    Assist        Assist Level: Dependent - Patient 0%   Wheelchair 150 feet activity     Assist      Assist Level: Dependent - Patient 0%   Blood pressure 110/79, pulse 86, temperature 97.8 F (36.6 C), resp. rate 18, height 6' (1.829 m), weight 108.1 kg, SpO2 99%.   Medical Problem List and Plan: 1. 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             -patient may  shower             -ELOS/Goals: 10/15, PT/OT/SLP Sup  -Continue CIR therapies including PT, OT, and SLP , 2.  L-internal jugular DVT/ A fib/Antithrombotics: -DVT/anticoagulation:  Pharmaceutical:LUE cephalic and internal jugular DVT Coumadin and Heparin bridge to change to lovenox at discharge             -antiplatelet therapy: ASA 3. Pain Management:  oxycodone or tramadol prn for pain.  4. Mood/Behavior/Sleep: LCSW to follow for evaluation and support.              -antipsychotic agents: N/A             -- Order melatonin prn for insomnia.  Scheduled trazodone nightly   -reports reasonable sleep 5. Neuropsych/cognition: This patient is capable of making decisions on his own behalf. 6. Skin/Wound  Care: Routine pressure relief measures.  7. Fluids/Electrolytes/Nutrition: Strict I/O. Check CMET in am.  8. 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 Fluid overload/SOB: Lasix 40 mg decreased to daily on 09/25 --developed cough last nigh with SOB 09/27 pm.  --daily weights with monitoring for signs of overload. .  -wt stable, monitor Edema in legs will give IV lasix x 1 today.  CXR unchanged, cards to f/u Wts are stable  and actually decreased today 10/5    Filed Weights   04/20/23 0500 04/21/23 0521 04/22/23 0430  Weight: 105.9 kg 105.8 kg 108.1 kg  Repeat IV lasix this am 10/4-appreciate cardiology consult  10/5 echo demonstrates large pericardial effusion, likely post-op -card recommended followng CRP and ESR--both are elevated. If they trend up then potentially colchicine rx.    -*iv lasix daily thru Monday then convert to torsemide 10/8 per cards   -*f/u crp and esr ordered for Monday   -no complaints today   -bp's holding Reduced edema on exam , no SOB, endurance in therapy improving -cont torsemide 20mg  every day 10/12: Weights up today, likely inaccurate  - monitor for s/s volume overload  10. A fib: Continue on amiodarone 200 mg every day and coumadin 11. Pre diabetes/Stress induced hyperglycemia: Monitor BS ac/hs.  --Hgb A1C 6.2 (prediabetic). --diet changed to CM from regular. Continue Levemir 8 units daily with SSI prn for tighter BS control. .  --Transition insulin to metformin as intake improves.   -9/30 decrease levemir to 6 u CBG (last 3)  Recent Labs    04/21/23 1634 04/21/23 2048 04/22/23 0607  GLUCAP 100* 156* 84  10/11-12 controlled   12 ABLA:   Monitor for signs of bleeding             --transfuse prn <7.0 or if symptomatic. Slowly improving at 8.4  Improving   CBC    Component Value Date/Time   WBC 6.9 04/20/2023 0356    RBC 3.29 (L) 04/20/2023 0356   HGB 9.0 (L) 04/20/2023 0356   HCT 29.2 (L) 04/20/2023 0356   PLT 341 04/20/2023 0356   MCV 88.8 04/20/2023 0356   MCH 27.4 04/20/2023 0356   MCHC 30.8 04/20/2023 0356   RDW 17.2 (H) 04/20/2023 0356    Recheck Hgb 1 wk    13. Constipation: Resolved and now w/diarrhea. Will hold laxative.    -9/30 frequent BM not liquid, continue to hold laxatives  14. Hyponatremia,mild  -10/7 Na stable at 137  15.  Hypo K+   -KCL currently daily repeat BMET on Monday or as per cardiology  -10/7 K+ up to 4.0, improved will decrease to daily, recheck 3.7 cont KCL daily   LOS: 13 days A FACE TO FACE EVALUATION WAS PERFORMED  Angelina Sheriff 04/22/2023, 10:30 AM

## 2023-04-22 NOTE — Progress Notes (Signed)
Physical Therapy Session Note  Patient Details  Name: Dale Mills MRN: [id number] Date of Birth: 19-Sep-1965  Today's Date: 04/22/2023 PT Individual Time: 1023-1120 PT Individual Time Calculation (min): 57 min   Short Term Goals: Week 2:  PT Short Term Goal 1 (Week 2): = LTG's  Skilled Therapeutic Interventions/Progress Updates: Patient sitting in WC on entrance to room. Patient alert and agreeable to PT session.   Patient reported no pain at beginning of session.  Therapeutic Activity Transfers: Pt performed sit<>stand transfers throughout session with RW and with supervision for safety. Pt maintained spinal precautions throughout by having B UE's crossed over chest.  Pt ambulated to bathroom at beginning (from [facility]) of session, and at end of session (from day room) in order to void bladder. PTA managed IV pole with pt in RW and performed with supervision for safety. Pt then used sink to wash hands with supervision with no LOB throughout session noted.   Gait Training:  Pt ambulated 200'+ using RW with CGA/supervision throughout for safety at end of session (around day room/nsg loop and back to room). Pt performed following ankle weight exercise and with notable increase in L LE step clearance with pt reporting feeling "stronger" and "lighter" in advancing L LE through swing phase.   Therapeutic Exercise: Pt performed the following exercises with therapist providing the described cuing and facilitation for improvement. - Nustep 15:10 with VC to maintain above 50 steps per minute. Pt with 750 steps on level 6 with no reports of SOB but did require rest break with water halfway through. - Pt performed step to 8" step with L LE (7lb ankle weight donned) and with L HHA. Pt performed 1 x 10 and 1 x 7. Pt with VC to increase hip flexion prior to advancing L LE back to neutral stance as to avoid sliding off step. Pt with min/heavy minA required to maintain standing balance.   Patient sitting  in recliner at end of session with brakes locked, NT present, chair alarm set, and all needs within reach.      Therapy Documentation Precautions:  Precautions Precautions: Sternal, Fall Precaution Comments: Verbal education provided during eval. Pt able to verbalize he "doesn't need to open up the incision w/ movement" Restrictions Weight Bearing Restrictions: No RUE Weight Bearing: Weight bearing as tolerated LUE Weight Bearing: Weight bearing as tolerated Other Position/Activity Restrictions: sternal precaution  Therapy/Group: Individual Therapy  Nathyn Luiz PTA 04/22/2023, 12:22 PM

## 2023-04-22 NOTE — Progress Notes (Signed)
Speech Language Pathology Daily Session Note  Patient Details  Name: Dale Mills MRN: [id number] Date of Birth: 15-Jun-1966  Today's Date: 04/22/2023 SLP Individual Time: 0912-1000 SLP Individual Time Calculation (min): 48 min  Short Term Goals: Week 2: SLP Short Term Goal 1 (Week 2): Patient will attend to left visual field during functional tasks with Mod I verbal cues SLP Short Term Goal 2 (Week 2): Patient will recall novel information given a 10- 15 minute delay with 90% acc given min A SLP Short Term Goal 3 (Week 2): Patient will complete moderately complex problem solving with 90% acc with sup A SLP Short Term Goal 4 (Week 2): Patient will utilize speech intelligibility strategies at the conversation level to achieve 100% intelligibility with Mod I verbal cues.  Skilled Therapeutic Interventions: SLP conducted skilled therapy session targeting cognitive retraining and communication goals. SLP and patient discussed information from previous speech therapy sessions and events from earlier in the day/hospital stay with patient recalling all information with supervisionA. During functional tasks, patient benefited from supervisionA to attend to the left side throughout session. Patient recalled WRAP memory strategies and SLOP speech intelligibility strategies with modI and provided examples of all strategies with supervisionA. During conversation level task, patient benefited from supervision verbal cues to utilize speech strategies. Patient was left in chair with call bell in reach and chair alarm set. SLP will continue to target goals per plan of care.      Pain Pain Assessment Pain Scale: 0-10 Pain Score: 0-No pain  Therapy/Group: Individual Therapy  Jeannie Done, M.A., CCC-SLP  Yetta Barre 04/22/2023, 12:08 PM

## 2023-04-23 LAB — PROTIME-INR
INR: 2.3 — ABNORMAL HIGH (ref 0.8–1.2)
Prothrombin Time: 25.5 s — ABNORMAL HIGH (ref 11.4–15.2)

## 2023-04-23 LAB — GLUCOSE, CAPILLARY
Glucose-Capillary: 99 mg/dL (ref 70–99)
Glucose-Capillary: 79 mg/dL (ref 70–99)
Glucose-Capillary: 135 mg/dL — ABNORMAL HIGH (ref 70–99)
Glucose-Capillary: 121 mg/dL — ABNORMAL HIGH (ref 70–99)

## 2023-04-23 NOTE — Progress Notes (Signed)
PROGRESS NOTE   Subjective/Complaints: No events overnight.  Multiple questions regarding current medical status, concerns for discharge.  Family at bedside.  Specifically, discussed visual deficits and how patient cannot return to driving on discharge.  Discussed stroke recovery process and expected gains within the next 6 months to 1 year, but cannot anticipate full recovery of vision or return to driving at any point.  Vitals stable, no fevers. Sating >95% on RA.  Last BM 10/12   ROS: Positives per HPI above. Denies fevers, chills, N/V, abdominal pain, SOB, chest pain, new weakness or paraesthesias.    Objective:   DG CHEST PORT 1 VIEW  Result Date: 04/22/2023 CLINICAL DATA:  Cough. EXAM: PORTABLE CHEST 1 VIEW COMPARISON:  04/12/2023. FINDINGS: Heart is enlarged and the mediastinal contour is within normal limits. There is atherosclerotic calcification of the aorta. Patchy airspace disease is seen at the left lung base, slightly improved from the prior exam. No effusion or pneumothorax. A right PICC line is stable in position. Sternotomy wires are present over the midline. No acute osseous abnormality. IMPRESSION: Patchy airspace disease at the left lung base, slightly improved from the prior exam. Electronically Signed   By: Thornell Sartorius M.D.   On: 04/22/2023 20:59   No results for input(s): "WBC", "HGB", "HCT", "PLT" in the last 72 hours.  No results for input(s): "NA", "K", "CL", "CO2", "GLUCOSE", "BUN", "CREATININE", "CALCIUM" in the last 72 hours.    Intake/Output Summary (Last 24 hours) at 04/23/2023 1022 Last data filed at 04/23/2023 0713 Gross per 24 hour  Intake 460 ml  Output 2800 ml  Net -2340 ml        Physical Exam: Vital Signs Blood pressure 134/82, pulse 88, temperature (!) 97.4 F (36.3 C), resp. rate 18, height 6' (1.829 m), weight 106 kg, SpO2 95%.   General: No acute distress. Sitting at bedside.   Surrounded by children, wife. Mood and affect are appropriate Eyes: EOMI. + left  field cut/homonymous hemianopsia, in R>L vision but present with binocular and monocular testing  Heart: Regular rate and rhythm no rubs murmurs or extra sounds Lungs: Clear to auscultation, breathing unlabored, no rales or wheezes Abdomen: Positive bowel sounds, soft nontender to palpation, nondistended Extremities: No clubbing, cyanosis. L ankle 1+ edema, R ankle 0-unchanged Skin: No evidence of breakdown, no evidence of rash, chest incision CDI Neurologic: AAOx3.  Mild cognitive deficits. + Left hemineglect, easily overcome with stimuli Moving all 4 extremities antigravity and against resistance Prior exam:  Cranial nerves II through XII intact, motor strength is 5/5 in bilateral deltoid, bicep, tricep, grip, 4/5 hip flexor, knee extensors, ankle dorsiflexor and plantar flexor Neurological exam stable today 04/23/2023  Left visual field cut with neglect partially compensated Musculoskeletal: Full range of motion in all 4 extremities. No joint swelling    Assessment/Plan: 1. Functional deficits which require 3+ hours per day of interdisciplinary therapy in a comprehensive inpatient rehab setting. Physiatrist is providing close team supervision and 24 hour management of active medical problems listed below. Physiatrist and rehab team continue to assess barriers to discharge/monitor patient progress toward functional and medical goals  Care Tool:  Bathing    Body  parts bathed by patient: Left arm, Chest, Right arm, Abdomen, Front perineal area, Buttocks, Face, Right upper leg, Left upper leg   Body parts bathed by helper: Right lower leg, Left lower leg     Bathing assist Assist Level: Minimal Assistance - Patient > 75%     Upper Body Dressing/Undressing Upper body dressing   What is the patient wearing?: Pull over shirt    Upper body assist Assist Level: Supervision/Verbal cueing    Lower  Body Dressing/Undressing Lower body dressing      What is the patient wearing?: Pants, Underwear/pull up     Lower body assist Assist for lower body dressing: Moderate Assistance - Patient 50 - 74%     Toileting Toileting    Toileting assist Assist for toileting: Minimal Assistance - Patient > 75%     Transfers Chair/bed transfer  Transfers assist     Chair/bed transfer assist level: Contact Guard/Touching assist     Locomotion Ambulation   Ambulation assist      Assist level: Minimal Assistance - Patient > 75% Assistive device: Walker-rolling Max distance: 170   Walk 10 feet activity   Assist     Assist level: Minimal Assistance - Patient > 75% Assistive device: Walker-rolling   Walk 50 feet activity   Assist Walk 50 feet with 2 turns activity did not occur: Safety/medical concerns  Assist level: Minimal Assistance - Patient > 75% Assistive device: Walker-rolling    Walk 150 feet activity   Assist Walk 150 feet activity did not occur: Safety/medical concerns  Assist level: Minimal Assistance - Patient > 75% Assistive device: Walker-rolling    Walk 10 feet on uneven surface  activity   Assist Walk 10 feet on uneven surfaces activity did not occur: Safety/medical concerns   Assist level: Minimal Assistance - Patient > 75% Assistive device: Walker-rolling   Wheelchair     Assist Is the patient using a wheelchair?: Yes Type of Wheelchair: Manual    Wheelchair assist level: Dependent - Patient 0%      Wheelchair 50 feet with 2 turns activity    Assist        Assist Level: Dependent - Patient 0%   Wheelchair 150 feet activity     Assist      Assist Level: Dependent - Patient 0%   Blood pressure 134/82, pulse 88, temperature (!) 97.4 F (36.3 C), resp. rate 18, height 6' (1.829 m), weight 106 kg, SpO2 95%.   Medical Problem List and Plan: 1. 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             -patient may  shower             -ELOS/Goals: 10/15, PT/OT/SLP Sup  -Continue CIR therapies including PT, OT, and SLP ,   2.  L-internal jugular DVT/ A fib/Antithrombotics: -DVT/anticoagulation:  Pharmaceutical:LUE cephalic and internal jugular DVT Coumadin and Heparin bridge to change to lovenox at discharge             -antiplatelet therapy: ASA 3. Pain Management:  oxycodone or tramadol prn for pain.  4. Mood/Behavior/Sleep: LCSW to follow for evaluation and support.              -antipsychotic agents: N/A             --  Order melatonin prn for insomnia.  Scheduled trazodone nightly   -reports reasonable sleep 5. Neuropsych/cognition: This patient is capable of making decisions on his own behalf. 6. Skin/Wound Care: Routine pressure relief measures.  7. Fluids/Electrolytes/Nutrition: Strict I/O. Check CMET in am.  8. 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 Fluid overload/SOB: Lasix 40 mg decreased to daily on 09/25 --developed cough last nigh with SOB 09/27 pm.  --daily weights with monitoring for signs of overload. .  -wt stable, monitor Edema in legs will give IV lasix x 1 today.  CXR unchanged, cards to f/u Wts are stable and actually decreased today 10/5    Filed Weights   04/21/23 0521 04/22/23 0430 04/23/23 0500  Weight: 105.8 kg 108.1 kg 106 kg  Repeat IV lasix this am 10/4-appreciate cardiology consult  10/5 echo demonstrates large pericardial effusion, likely post-op -card recommended followng CRP and ESR--both are elevated. If they trend up then  potentially colchicine rx.    -*iv lasix daily thru Monday then convert to torsemide 10/8 per cards   -*f/u crp and esr ordered for Monday   -no complaints today   -bp's holding Reduced edema on exam , no SOB, endurance in therapy improving -cont torsemide 20mg  every day 10/12: Weights up today, likely inaccurate  - exam volume status stable, CXR slightly improved from priors - continue current medications 10/13: weights down; monitor  10. A fib: Continue on amiodarone 200 mg every day and coumadin 11. Pre diabetes/Stress induced hyperglycemia: Monitor BS ac/hs.  --Hgb A1C 6.2 (prediabetic). --diet changed to CM from regular. Continue Levemir 8 units daily with SSI prn for tighter BS control. .  --Transition insulin to metformin as intake improves.   -9/30 decrease levemir to 6 u - 10/13: Educated patient on likely need for long-acting insulin on discharge, and role of long-acting versus short acting CBG (last 3)  Recent Labs    04/22/23 1616 04/22/23 2035 04/23/23 0610  GLUCAP 134* 98 99  10/11-13 controlled   12 ABLA:   Monitor for signs of bleeding             --transfuse prn <7.0 or if symptomatic. Slowly improving at 8.4  Improving   CBC    Component Value Date/Time   WBC 6.9 04/20/2023 0356   RBC 3.29 (L) 04/20/2023 0356   HGB 9.0 (L) 04/20/2023 0356   HCT 29.2 (L) 04/20/2023 0356   PLT 341 04/20/2023 0356   MCV 88.8 04/20/2023 0356   MCH 27.4 04/20/2023 0356   MCHC 30.8 04/20/2023 0356   RDW 17.2 (H) 04/20/2023 0356    Recheck Hgb 1 wk    13. Constipation: Resolved and now w/diarrhea. Will hold laxative.    -9/30 frequent BM not liquid, continue to hold laxatives   - LBM 10/13 14. Hyponatremia,mild  -10/7 Na stable at 137  15.  Hypo K+   -KCL currently daily repeat BMET on Monday or as per cardiology  -10/7 K+ up to 4.0, improved will decrease to daily, recheck 3.7 cont KCL daily   16.  Left homonymous hemianopsia.  Discussed with  patient and family no return to driving on discharge from the hospital.  Discussed with family setting up meals, medications on the right side at home.  LOS: 14 days A FACE TO FACE  EVALUATION WAS PERFORMED  Angelina Sheriff 04/23/2023, 10:22 AM

## 2023-04-23 NOTE — Discharge Instructions (Signed)
Inpatient Rehab Discharge Instructions  Dale Mills Discharge date and time:  04/26/23  Activities/Precautions/ Functional Status: Activity: no lifting, driving, or strenuous exercise till cleared by Md Diet: cardiac diet and diabetic diet Wound Care: keep wound clean and dry   Functional status:  ___ No restrictions      ___ Walk up steps independently _X__ 24/7 supervision/assistance   ___ Walk up steps with assistance ___ Intermittent supervision/assistance  ___ Bathe/dress independently ___ Walk with walker     ___ Bathe/dress with assistance ___ Walk Independently     ___ Shower independently ___ Walk with assistance     ___ Shower with assistance _X__ No alcohol      ___ Return to work/school ________  COMMUNITY REFERRALS UPON DISCHARGE:    Home Health:   PT     OT     RN                     Agency: [facility] Phone: [phone]    Medical Equipment/Items Ordered: Levan Hurst and Tub Transfer Bench                                                 Agency/Supplier: [employer] [phone]    Special Instructions:    My questions have been answered and I understand these instructions. I will adhere to these goals and the provided educational materials after my discharge from the hospital.  Patient/Caregiver Signature _______________________________ Date __________  Clinician Signature _______________________________________ Date __________  Please bring this form and your medication list with you to all your follow-up doctor's appointments. Information on my medicine - Coumadin   (Warfarin)  This medication education was reviewed with me or my healthcare representative as part of my discharge preparation.    Why was Coumadin prescribed for you? Coumadin was prescribed for you because you have a blood clot or a medical condition that can cause an increased risk of forming blood clots. Blood clots can cause serious health problems by blocking the flow of blood to  the heart, lung, or brain. Coumadin can prevent harmful blood clots from forming. As a reminder your indication for Coumadin is:  Stroke Prevention because of Atrial Fibrillation  What test will check on my response to Coumadin? While on Coumadin (warfarin) you will need to have an INR test regularly to ensure that your dose is keeping you in the desired range. The INR (international normalized ratio) number is calculated from the result of the laboratory test called prothrombin time (PT).  If an INR APPOINTMENT HAS NOT ALREADY BEEN MADE FOR YOU please schedule an appointment to have this lab work done by your health care provider within 7 days. Your INR goal is usually a number between:  2 to 3 or your provider may give you a more narrow range like 2-2.5.  Ask your health care provider during an office visit what your goal INR is.  What  do you need to  know  About  COUMADIN? Take Coumadin (warfarin) exactly as prescribed by your healthcare provider about the same time each day.  DO NOT stop taking without talking to the doctor who prescribed the medication.  Stopping without other blood clot prevention medication to take the place of Coumadin may increase your risk of developing a new clot or stroke.  Get refills before you run  out.  What do you do if you miss a dose? If you miss a dose, take it as soon as you remember on the same day then continue your regularly scheduled regimen the next day.  Do not take two doses of Coumadin at the same time.  Important Safety Information A possible side effect of Coumadin (Warfarin) is an increased risk of bleeding. You should call your healthcare provider right away if you experience any of the following: Bleeding from an injury or your nose that does not stop. Unusual colored urine (red or dark brown) or unusual colored stools (red or black). Unusual bruising for unknown reasons. A serious fall or if you hit your head (even if there is no  bleeding).  Some foods or medicines interact with Coumadin (warfarin) and might alter your response to warfarin. To help avoid this: Eat a balanced diet, maintaining a consistent amount of Vitamin K. Notify your provider about major diet changes you plan to make. Avoid alcohol or limit your intake to 1 drink for women and 2 drinks for men per day. (1 drink is 5 oz. wine, 12 oz. beer, or 1.5 oz. liquor.)  Make sure that ANY health care provider who prescribes medication for you knows that you are taking Coumadin (warfarin).  Also make sure the healthcare provider who is monitoring your Coumadin knows when you have started a new medication including herbals and non-prescription products.  Coumadin (Warfarin)  Major Drug Interactions  Increased Warfarin Effect Decreased Warfarin Effect  Alcohol (large quantities) Antibiotics (esp. Septra/Bactrim, Flagyl, Cipro) Amiodarone (Cordarone) Aspirin (ASA) Cimetidine (Tagamet) Megestrol (Megace) NSAIDs (ibuprofen, naproxen, etc.) Piroxicam (Feldene) Propafenone (Rythmol SR) Propranolol (Inderal) Isoniazid (INH) Posaconazole (Noxafil) Barbiturates (Phenobarbital) Carbamazepine (Tegretol) Chlordiazepoxide (Librium) Cholestyramine (Questran) Griseofulvin Oral Contraceptives Rifampin Sucralfate (Carafate) Vitamin K   Coumadin (Warfarin) Major Herbal Interactions  Increased Warfarin Effect Decreased Warfarin Effect  Garlic Ginseng Ginkgo biloba Coenzyme Q10 Green tea St. John's wort    Coumadin (Warfarin) FOOD Interactions  Eat a consistent number of servings per week of foods HIGH in Vitamin K (1 serving =  cup)  Collards (cooked, or boiled & drained) Kale (cooked, or boiled & drained) Mustard greens (cooked, or boiled & drained) Parsley *serving size only =  cup Spinach (cooked, or boiled & drained) Swiss chard (cooked, or boiled & drained) Turnip greens (cooked, or boiled & drained)  Eat a consistent number of servings per  week of foods MEDIUM-HIGH in Vitamin K (1 serving = 1 cup)  Asparagus (cooked, or boiled & drained) Broccoli (cooked, boiled & drained, or raw & chopped) Brussel sprouts (cooked, or boiled & drained) *serving size only =  cup Lettuce, raw (green leaf, endive, romaine) Spinach, raw Turnip greens, raw & chopped   These websites have more information on Coumadin (warfarin):  http://www.king-russell.com/; https://www.hines.net/;

## 2023-04-23 NOTE — Progress Notes (Signed)
PHARMACY - ANTICOAGULATION CONSULT NOTE  Pharmacy Consult for Warfarin Indication: atrial fibrillation  No Known Allergies  Patient Measurements: Height: 6' (182.9 cm) Weight: 106 kg (233 lb 11 oz) IBW/kg (Calculated) : 77.6  Vital Signs: Temp: 97.4 F (36.3 C) (10/13 0505) BP: 134/82 (10/13 0505) Pulse Rate: 88 (10/13 0505)  Labs: Recent Labs    04/21/23 0317 04/23/23 0340  LABPROT 25.7* 25.5*  INR 2.3* 2.3*    Estimated Creatinine Clearance: 128.2 mL/min (by C-G formula based on SCr of 0.74 mg/dL).   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 Having recurrent Afib postop, pharmacy consulted to start warfarin for Afib on 9/24. On amiodarone and ampicillin which may increase sensitivity to warfarin.  Acute DVT noted in the left internal jugular and a superficial thrombosis of left upper arm per duplex 9/26. Lovenox 40 mg daily 9/22>>9/24 > IV Heparin on 9/26 > therapeutic Lovenox 9/27>>10/1, when INR therapeutic x 3 consecutive days.   INR remains therapeutic at 2.3. No warfarin given 10/9 inadvertently, order had 1 day stop time which I did not notice on 10/8. Resumed with 4 mg daily on 10/10. CBC stable. Amiodarone 400 mg BID > 200 mg daily on 10/4.  Goal of Therapy:  INR 2-3 Monitor platelets by anticoagulation protocol: Yes   Plan:  Continue Warfarin 4 mg daily. Standing order for PT/INR Mondays and Thursdays CBC on Mondays and Thursdays with bmet.  Dennie Fetters, RPh 04/23/2023,11:39 AM

## 2023-04-23 NOTE — Progress Notes (Signed)
Occupational Therapy Session Note  Patient Details  Name: Dale Mills MRN: [id number] Date of Birth: 1966-01-06  Today's Date: 04/23/2023 OT Individual Time: 3086-5784 OT Individual Time Calculation (min): 55 min    Short Term Goals: Week 1:  OT Short Term Goal 1 (Week 1): Pt will be able to recall sternal precautions during self care tasks 3/5 sessions with minimal cueing. OT Short Term Goal 1 - Progress (Week 1): Met OT Short Term Goal 2 (Week 1): Pt will complete LB dressing with Mod A utilizing AE as needed. OT Short Term Goal 2 - Progress (Week 1): Met OT Short Term Goal 3 (Week 1): Pt will completed LB bathing with Min A utilizing AE as needed. OT Short Term Goal 3 - Progress (Week 1): Met Week 2:  OT Short Term Goal 1 (Week 2): STG=LTGs d/t Pt ELOS  Skilled Therapeutic Interventions/Progress Updates:    1:1 Pt received in the bed and requested to bathe today. Pt wanted to shower but still running IV. Pt performed bed mobility with min cues for maintaining sternal precautions. Transferred to regular chair (without arms) at the sink to focus on bathing sit to stand. Performed all sit to stands with supervision without use of Ues. Pt bathed and dressed all parts with setup. OT did assist pt with shaving and washing hair today. Pt ambulated to the bathroom with min A to void at the toilet in standing and then ambulated to recliner to rest without AD. Pt does continue to demonstrate decr short term memory requiring min cues. Left with legs elevated with call and bell and safety pad.   Therapy Documentation Precautions:  Precautions Precautions: Sternal, Fall Precaution Comments: Verbal education provided during eval. Pt able to verbalize he "doesn't need to open up the incision w/ movement" Restrictions Weight Bearing Restrictions: No RUE Weight Bearing: Weight bearing as tolerated LUE Weight Bearing: Weight bearing as tolerated Other Position/Activity Restrictions: sternal  precaution  Pain:  No reports of pain   Therapy/Group: Individual Therapy  Roney Mans [facility] 04/23/2023, 3:06 PM

## 2023-04-24 ENCOUNTER — Encounter: Payer: Self-pay | Admitting: Cardiology

## 2023-04-24 ENCOUNTER — Ambulatory Visit: Payer: Self-pay

## 2023-04-24 LAB — PROTIME-INR
INR: 2.3 — ABNORMAL HIGH (ref 0.8–1.2)
INR: 2.3 — ABNORMAL HIGH (ref 0.8–1.2)
Prothrombin Time: 25.6 s — ABNORMAL HIGH (ref 11.4–15.2)
Prothrombin Time: 25.1 s — ABNORMAL HIGH (ref 11.4–15.2)

## 2023-04-24 LAB — BASIC METABOLIC PANEL
Anion gap: 9 (ref 5–15)
BUN: 19 mg/dL (ref 6–20)
CO2: 26 mmol/L (ref 22–32)
Calcium: 9.3 mg/dL (ref 8.9–10.3)
Chloride: 101 mmol/L (ref 98–111)
Creatinine, Ser: 0.89 mg/dL (ref 0.61–1.24)
GFR, Estimated: >60 mL/min (ref >60)
Glucose, Bld: 148 mg/dL — ABNORMAL HIGH (ref 70–99)
Potassium: 3.6 mmol/L (ref 3.5–5.1)
Sodium: 136 mmol/L (ref 135–145)

## 2023-04-24 LAB — CBC
HCT: 33.2 % — ABNORMAL LOW (ref 39.0–52.0)
Hemoglobin: 10.1 g/dL — ABNORMAL LOW (ref 13.0–17.0)
MCH: 26.3 pg (ref 26.0–34.0)
MCHC: 30.4 g/dL (ref 30.0–36.0)
MCV: 86.5 fL (ref 80.0–100.0)
Platelets: 353 10*3/uL (ref 150–400)
RBC: 3.84 MIL/uL — ABNORMAL LOW (ref 4.22–5.81)
RDW: 16.3 % — ABNORMAL HIGH (ref 11.5–15.5)
WBC: 8.5 10*3/uL (ref 4.0–10.5)
nRBC: 0 % (ref 0.0–0.2)

## 2023-04-24 LAB — GLUCOSE, CAPILLARY
Glucose-Capillary: 87 mg/dL (ref 70–99)
Glucose-Capillary: 90 mg/dL (ref 70–99)
Glucose-Capillary: 45 mg/dL — ABNORMAL LOW (ref 70–99)
Glucose-Capillary: 134 mg/dL — ABNORMAL HIGH (ref 70–99)
Glucose-Capillary: 113 mg/dL — ABNORMAL HIGH (ref 70–99)

## 2023-04-24 LAB — C-REACTIVE PROTEIN: CRP: 2 mg/dL — ABNORMAL HIGH (ref <1.0)

## 2023-04-24 LAB — SEDIMENTATION RATE: Sed Rate: 59 mm/h — ABNORMAL HIGH (ref 0–16)

## 2023-04-24 MED ORDER — CEFTRIAXONE IV (FOR PTA / DISCHARGE USE ONLY): 2.0000 g | Freq: Two times a day (BID) | INTRAVENOUS | 0 refills | Status: DC

## 2023-04-24 MED ORDER — AMPICILLIN IV (FOR PTA / DISCHARGE USE ONLY): 12.0000 g | INTRAVENOUS | 0 refills | Status: DC

## 2023-04-24 MED ORDER — GLUCOSE 40 % PO GEL
ORAL | Status: AC
  Filled 2023-04-24: qty 1.21

## 2023-04-24 MED ORDER — GLUCOSE 40 % PO GEL
2.0000 | ORAL | Status: AC
  Administered 2023-04-24: 2 via ORAL

## 2023-04-24 NOTE — Progress Notes (Addendum)
BS have been tightly controlled-- Hgb A1c-6.2 due to pre-diabetes and BS down to 45 this pm. Will d/c Levemir and monitor BS on diet alone. May need low dose metformin if BS starts trending up off insulin.

## 2023-04-24 NOTE — Progress Notes (Signed)
Patient ID: Dale Mills, male   DOB: [birth year], 57 y.o.   MRN: [id number]  This SW covering for primary SW, Lavera Guise.   Per nursing, pt dtr Amber here today and now stating she is unable to take patient home, after seeing DME being delivered to his room. Notes in chart specifying family has agreed to take home. Will inform primary SW for further discussion tomorrow. Per EMR, pt DME TTB and transport chair ordered; no SNF willing to accept; and family edu tomorrow 1pm-4pm.   Cecile Sheerer, MSW, LCSWA Office: [phone] Cell: [phone] Fax: [phone]

## 2023-04-24 NOTE — Progress Notes (Signed)
Patient ID: Dale Mills, male   DOB: [birth year], 57 y.o.   MRN: [id number]  Daughter have arranged family education for tomorrow 10/15 1-4 PM. Patient d/c moved to 10/16.

## 2023-04-24 NOTE — Progress Notes (Signed)
PROGRESS NOTE   Subjective/Complaints:  No issues overnite, went up 4 steps x 3 sets    ROS: Positives per HPI above. Denies fevers, chills, N/V, abdominal pain, SOB, chest pain, new weakness or paraesthesias.    Objective:   DG CHEST PORT 1 VIEW  Result Date: 04/22/2023 CLINICAL DATA:  Cough. EXAM: PORTABLE CHEST 1 VIEW COMPARISON:  04/12/2023. FINDINGS: Heart is enlarged and the mediastinal contour is within normal limits. There is atherosclerotic calcification of the aorta. Patchy airspace disease is seen at the left lung base, slightly improved from the prior exam. No effusion or pneumothorax. A right PICC line is stable in position. Sternotomy wires are present over the midline. No acute osseous abnormality. IMPRESSION: Patchy airspace disease at the left lung base, slightly improved from the prior exam. Electronically Signed   By: Thornell Sartorius M.D.   On: 04/22/2023 20:59   No results for input(s): "WBC", "HGB", "HCT", "PLT" in the last 72 hours.  No results for input(s): "NA", "K", "CL", "CO2", "GLUCOSE", "BUN", "CREATININE", "CALCIUM" in the last 72 hours.    Intake/Output Summary (Last 24 hours) at 04/24/2023 0930 Last data filed at 04/24/2023 0715 Gross per 24 hour  Intake 1160 ml  Output 2500 ml  Net -1340 ml        Physical Exam: Vital Signs Blood pressure 118/66, pulse 97, temperature 98 F (36.7 C), temperature source Oral, resp. rate 18, height 6' (1.829 m), weight 105 kg, SpO2 97%.   General: No acute distress. Sitting at bedside.  Surrounded by children, wife. Mood and affect are appropriate Eyes: EOMI. + left  field cut/homonymous hemianopsia, in R>L vision but present with binocular and monocular testing  Heart: Regular rate and rhythm no rubs murmurs or extra sounds Lungs: Clear to auscultation, breathing unlabored, no rales or wheezes Abdomen: Positive bowel sounds, soft nontender to palpation,  nondistended Extremities: No clubbing, cyanosis. L ankle 1+ edema, R ankle 0-unchanged Skin: No evidence of breakdown, no evidence of rash, chest incision CDI Neurologic: AAOx3.  Mild cognitive deficits. + Left hemineglect,and left HH Moving all 4 extremities antigravity and against resistance Prior exam:  Cranial nerves II through XII intact, motor strength is 5/5 in bilateral deltoid, bicep, tricep, grip, 4/5 hip flexor, knee extensors, ankle dorsiflexor and plantar flexor Neurological exam stable today 04/24/2023  Left visual field cut with neglect partially compensated Musculoskeletal: Full range of motion in all 4 extremities. No joint swelling    Assessment/Plan: 1. Functional deficits which require 3+ hours per day of interdisciplinary therapy in a comprehensive inpatient rehab setting. Physiatrist is providing close team supervision and 24 hour management of active medical problems listed below. Physiatrist and rehab team continue to assess barriers to discharge/monitor patient progress toward functional and medical goals  Care Tool:  Bathing    Body parts bathed by patient: Left arm, Chest, Right arm, Abdomen, Front perineal area, Buttocks, Face, Right upper leg, Left upper leg, Right lower leg, Left lower leg   Body parts bathed by helper: Right lower leg, Left lower leg     Bathing assist Assist Level: Supervision/Verbal cueing     Upper Body Dressing/Undressing Upper body dressing  What is the patient wearing?: Hospital gown only    Upper body assist Assist Level: Supervision/Verbal cueing    Lower Body Dressing/Undressing Lower body dressing      What is the patient wearing?: Pants     Lower body assist Assist for lower body dressing: Supervision/Verbal cueing     Toileting Toileting    Toileting assist Assist for toileting: Contact Guard/Touching assist     Transfers Chair/bed transfer  Transfers assist     Chair/bed transfer assist level:  Contact Guard/Touching assist     Locomotion Ambulation   Ambulation assist      Assist level: Minimal Assistance - Patient > 75% Assistive device: Walker-rolling Max distance: 170   Walk 10 feet activity   Assist     Assist level: Minimal Assistance - Patient > 75% Assistive device: Walker-rolling   Walk 50 feet activity   Assist Walk 50 feet with 2 turns activity did not occur: Safety/medical concerns  Assist level: Minimal Assistance - Patient > 75% Assistive device: Walker-rolling    Walk 150 feet activity   Assist Walk 150 feet activity did not occur: Safety/medical concerns  Assist level: Minimal Assistance - Patient > 75% Assistive device: Walker-rolling    Walk 10 feet on uneven surface  activity   Assist Walk 10 feet on uneven surfaces activity did not occur: Safety/medical concerns   Assist level: Minimal Assistance - Patient > 75% Assistive device: Development worker, international aid     Assist Is the patient using a wheelchair?: Yes Type of Wheelchair: Manual    Wheelchair assist level: Dependent - Patient 0%      Wheelchair 50 feet with 2 turns activity    Assist        Assist Level: Dependent - Patient 0%   Wheelchair 150 feet activity     Assist      Assist Level: Dependent - Patient 0%   Blood pressure 118/66, pulse 97, temperature 98 F (36.7 C), temperature source Oral, resp. rate 18, height 6' (1.829 m), weight 105 kg, SpO2 97%.   Medical Problem List and Plan: 1. 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             -patient may  shower             -ELOS/Goals: 10/16, PT/OT/SLP Sup, plan is now return to  home No driving until left neglect and visual field deficits improved, will need formal visual field testing at optho office in ~4-5 mo  -Continue CIR therapies including PT, OT, and SLP ,   2.  L-internal jugular DVT/ A fib/Antithrombotics: -DVT/anticoagulation:  Pharmaceutical:LUE cephalic and internal jugular DVT Coumadin and Heparin bridge to change to lovenox at discharge             -antiplatelet therapy: ASA 3. Pain Management:  oxycodone or tramadol prn for pain.  4. Mood/Behavior/Sleep: LCSW to follow for evaluation and support.              -antipsychotic agents: N/A             -- Order melatonin prn for insomnia.  Scheduled trazodone nightly   -reports reasonable sleep 5. Neuropsych/cognition: This patient is capable of making decisions on his own behalf. 6.  Skin/Wound Care: Routine pressure relief measures.  7. Fluids/Electrolytes/Nutrition: Strict I/O. Check CMET in am.  8. 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 Fluid overload/SOB: Lasix 40 mg decreased to daily on 09/25 --developed cough last nigh with SOB 09/27 pm.  --daily weights with monitoring for signs of overload. .  -wt stable, monitor Edema in legs will give IV lasix x 1 today.  CXR unchanged, cards to f/u Wts are stable and actually decreased today 10/5    Filed Weights   04/22/23 0430 04/23/23 0500 04/24/23 0710  Weight: 108.1 kg 106 kg 105 kg  Repeat IV lasix this am 10/4-appreciate cardiology consult  10/5 echo demonstrates large pericardial effusion, likely post-op -card recommended followng CRP and ESR--both are elevated. If they trend up then potentially colchicine rx.    -*iv lasix daily thru Monday then convert to torsemide 10/8 per cards   -*f/u crp and esr ordered for Monday   -no complaints today   -bp's holding Reduced edema on exam , no SOB,  endurance in therapy improving -cont torsemide 20mg  every day Clinically resolved exercise tolerance improving   10. A fib: Continue on amiodarone 200 mg every day and coumadin 11. Pre diabetes/Stress induced hyperglycemia: Monitor BS ac/hs.  --Hgb A1C 6.2 (prediabetic). --diet changed to CM from regular. Continue Levemir 8 units daily with SSI prn for tighter BS control. .  --Transition insulin to metformin as intake improves.   -9/30 decrease levemir to 6 u - 10/13: Educated patient on likely need for long-acting insulin on discharge, and role of long-acting versus short acting CBG (last 3)  Recent Labs    04/23/23 1633 04/23/23 2147 04/24/23 0641  GLUCAP 135* 121* 87  10/14 controlled   12 ABLA:   Monitor for signs of bleeding             --transfuse prn <7.0 or if symptomatic. Slowly improving at 8.4  Improving   CBC    Component Value Date/Time   WBC 6.9 04/20/2023 0356   RBC 3.29 (L) 04/20/2023 0356   HGB 9.0 (L) 04/20/2023 0356   HCT 29.2 (L) 04/20/2023 0356   PLT 341 04/20/2023 0356   MCV 88.8 04/20/2023 0356   MCH 27.4 04/20/2023 0356   MCHC 30.8 04/20/2023 0356   RDW 17.2 (H) 04/20/2023 0356    Recheck Hgb 1 wk    13. Constipation: Resolved and now w/diarrhea. Will hold laxative.    -9/30 frequent BM not liquid, continue to hold laxatives   - LBM 10/13 14. Hyponatremia,mild  -10/7 Na stable at 137  15.  Hypo K+   -KCL currently daily repeat BMET on Monday or as per cardiology  -10/7 K+ up to 4.0, improved will decrease to daily, recheck 3.7 cont KCL daily   16.  Left homonymous hemianopsia.  Discussed with patient and family no return to driving on discharge from the hospital. Will monitor clinically as OP  LOS: 15 days A FACE TO FACE EVALUATION WAS PERFORMED  Erick Colace 04/24/2023, 9:30 AM

## 2023-04-24 NOTE — Progress Notes (Signed)
Physical Therapy Discharge Summary  Patient Details  Name: Dale Mills MRN: [id number] Date of Birth: [birth year]  Date of Discharge from PT service:April 24, 2023  Patient has met 9 of 9 long term goals due to improved activity tolerance, improved balance, improved postural control, increased strength, decreased pain, ability to compensate for deficits, improved attention, and improved awareness.  Patient to discharge at an ambulatory level Supervision.   Patient's care partner is independent to provide the necessary physical assistance at discharge.  Reasons goals not met: ***  Recommendation:  Patient will benefit from ongoing skilled PT services in {setting:3041680} to continue to advance safe functional mobility, address ongoing impairments in ***, and minimize fall risk.  Equipment: *** Pt reports owning a RW at home.  Reasons for discharge: treatment goals met and discharge from hospital  Patient/family agrees with progress made and goals achieved: Yes  PT Discharge Precautions/Restrictions Precautions Precautions: Sternal;Fall Restrictions Weight Bearing Restrictions: No Other Position/Activity Restrictions: sternal precaution Pain Interference Pain Interference Pain Effect on Sleep: 0. Does not apply - I have not had any pain or hurting in the past 5 days Pain Interference with Therapy Activities: 0. Does not apply - I have not received rehabilitationtherapy in the past 5 days Pain Interference with Day-to-Day Activities: 1. Rarely or not at all Vision/Perception  Vision - History Ability to See in Adequate Light: 0 Adequate Perception Perception: Within Functional Limits Praxis Praxis: WFL  Cognition Orientation Level: Oriented X4 Sensation Sensation Light Touch: Not tested Hot/Cold: Appears Intact Proprioception: Appears Intact Stereognosis: Appears Intact Additional Comments: Neuropathy in both feet Coordination Gross Motor Movements are Fluid and  Coordinated: Yes Motor  Motor Motor: Other (comment) Motor - Discharge Observations: generalized deconditioning d/t cardiovascular defecits  Mobility Bed Mobility Bed Mobility: Supine to Sit;Sit to Supine Supine to Sit: Supervision/Verbal cueing Sit to Supine: Supervision/Verbal cueing Transfers Transfers: Sit to Stand;Stand to Sit;Stand Pivot Transfers Sit to Stand: Supervision/Verbal cueing Stand to Sit: Supervision/Verbal cueing Stand Pivot Transfers: Supervision/Verbal cueing Stand Pivot Transfer Details: Verbal cues for precautions/safety;Verbal cues for gait pattern;Verbal cues for technique Transfer (Assistive device): Rolling walker Locomotion  Gait Ambulation: Yes Gait Assistance: Supervision/Verbal cueing Gait Distance (Feet): 150 Feet Assistive device: Rolling walker Gait Assistance Details: Verbal cues for technique;Verbal cues for precautions/safety;Verbal cues for gait pattern Gait Gait: Yes Gait Pattern: Impaired Gait Pattern: Step-through pattern;Trunk flexed;Decreased stride length;Within Functional Limits Stairs / Additional Locomotion Stairs: Yes Stairs Assistance: Contact Guard/Touching assist Stair Management Technique: Two rails;Forwards;Step to pattern Number of Stairs: 12 Height of Stairs: 6 Pick up small object from the floor assist level: Contact Guard/Touching assist Wheelchair Mobility Wheelchair Mobility: No  Trunk/Postural Assessment  Cervical Assessment Cervical Assessment: Within Functional Limits Thoracic Assessment Thoracic Assessment: Within Functional Limits Lumbar Assessment Lumbar Assessment: Exceptions to [facility] (posterior tilt) Postural Control Postural Control: Deficits on evaluation Righting Reactions: deficits d/t weakness & fatigue  Balance Balance Balance Assessed: Yes Standardized Balance Assessment Standardized Balance Assessment: Berg Balance Test Berg Balance Test Sit to Stand: Able to stand without using hands and  stabilize independently Standing Unsupported: Able to stand safely 2 minutes Sitting with Back Unsupported but Feet Supported on Floor or Stool: Able to sit safely and securely 2 minutes Stand to Sit: Sits safely with minimal use of hands Transfers: Able to transfer safely, minor use of hands Standing Unsupported with Eyes Closed: Able to stand 10 seconds with supervision Standing Ubsupported with Feet Together: Able to place feet together independently and stand for 1 minute with  supervision From Standing, Reach Forward with Outstretched Arm: Reaches forward but needs supervision From Standing Position, Pick up Object from Floor: Able to pick up shoe, needs supervision From Standing Position, Turn to Look Behind Over each Shoulder: Needs supervision when turning Turn 360 Degrees: Needs close supervision or verbal cueing Standing Unsupported, Alternately Place Feet on Step/Stool: Able to complete >2 steps/needs minimal assist Standing Unsupported, One Foot in Front: Able to take small step independently and hold 30 seconds Standing on One Leg: Tries to lift leg/unable to hold 3 seconds but remains standing independently Total Score: 36 Static Sitting Balance Static Sitting - Balance Support: No upper extremity supported;Feet supported Static Sitting - Level of Assistance: 7: Independent Dynamic Sitting Balance Dynamic Sitting - Balance Support: During functional activity;Feet supported Dynamic Sitting - Level of Assistance: 5: Stand by assistance Static Standing Balance Static Standing - Balance Support: During functional activity;Bilateral upper extremity supported Static Standing - Level of Assistance: 6: Modified independent (Device/Increase time) Dynamic Standing Balance Dynamic Standing - Balance Support: Bilateral upper extremity supported;During functional activity Dynamic Standing - Level of Assistance: 5: Stand by assistance Extremity Assessment      RLE Assessment RLE  Assessment: Exceptions to [facility] General Strength Comments: grossly 4/5 LLE Assessment LLE Assessment: Exceptions to [facility] General Strength Comments: Grossly 4/5    Christian P Manhard  PT, DPT, CSRS  04/24/2023, 10:06 AM

## 2023-04-24 NOTE — Progress Notes (Signed)
Speech Language Pathology Daily Session Note  Patient Details  Name: Dale Mills MRN: [id number] Date of Birth: [birth year]  Today's Date: 04/24/2023 SLP Individual Time: 1300-1345 SLP Individual Time Calculation (min): 45 min  Short Term Goals: Week 2: SLP Short Term Goal 1 (Week 2): Patient will attend to left visual field during functional tasks with Mod I verbal cues SLP Short Term Goal 2 (Week 2): Patient will recall novel information given a 10- 15 minute delay with 90% acc given min A SLP Short Term Goal 3 (Week 2): Patient will complete moderately complex problem solving with 90% acc with sup A SLP Short Term Goal 4 (Week 2): Patient will utilize speech intelligibility strategies at the conversation level to achieve 100% intelligibility with Mod I verbal cues.  Skilled Therapeutic Interventions:  Patient was seen in PM to address cognitive re- training. Pt was alert and seated upright in [facility] upon SLP arrival. He updated clinician regarding discharge plans, reporting that he had to stay an additional day. He was agreeable for SLP session and able to verbalize some of his speech and memory strategies along with deficits in inattention. SLP challenged pt in moderately complex money and medical management task. Pt required min to mod A for identifying medication errors in a BID pill box. Additional cues warranted for light house technique in order to attend to left side of page. He completed money management through word problems presented verbally with 67% acc with mod A. Finally, given daily living and medical scenarios presented verbally, pt identified appropriate solutions with 90% acc with sup A. Pt was left seated upright in WC with call button within reach. SLP to continue POC.   Pain Pain Assessment Pain Scale: 0-10 Pain Score: 0-No pain  Therapy/Group: Individual Therapy  Renaee Munda 04/24/2023, 4:25 PM

## 2023-04-24 NOTE — Plan of Care (Signed)
  Problem: RH SAFETY Goal: RH STG ADHERE TO SAFETY PRECAUTIONS W/ASSISTANCE/DEVICE Description: STG Adhere to Safety Precautions With cues Assistance/Device. 04/24/2023 0250 by Hosie Poisson, RN Outcome: Progressing   Problem: RH KNOWLEDGE DEFICIT Goal: RH STG INCREASE KNOWLEDGE OF HYPERTENSION Description: Patient and daughter will be able to manage HTN with medications and dietary modifications using educational resources independently 04/24/2023 0250 by Hosie Poisson, RN Outcome: Progressing   Problem: Education: Goal: Will demonstrate proper wound care and an understanding of methods to prevent future damage Outcome: Progressing Goal: Knowledge of disease or condition will improve Outcome: Progressing   Problem: Activity: Goal: Risk for activity intolerance will decrease Outcome: Progressing   Problem: Cardiac: Goal: Will achieve and/or maintain hemodynamic stability 04/24/2023 0250 by Hosie Poisson, RN Outcome: Progressing 1  Problem: Skin Integrity: Goal: Wound healing without signs and symptoms of infection 04/24/2023 0250 by Hosie Poisson, RN Outcome: Progressing

## 2023-04-24 NOTE — Progress Notes (Signed)
PHARMACY - ANTICOAGULATION CONSULT NOTE  Pharmacy Consult for Warfarin Indication: atrial fibrillation  No Known Allergies  Patient Measurements: Height: 6' (182.9 cm) Weight: 105 kg (231 lb 7.7 oz) IBW/kg (Calculated) : 77.6  Vital Signs: Temp: 98 F (36.7 C) (10/14 0710) Temp Source: Oral (10/14 0710) BP: 118/66 (10/14 0710) Pulse Rate: 97 (10/14 0710)  Labs: Recent Labs    04/23/23 0340 04/24/23 0318  LABPROT 25.5* 25.6*  INR 2.3* 2.3*    Estimated Creatinine Clearance: 127.7 mL/min (by C-G formula based on SCr of 0.74 mg/dL).   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 Having recurrent Afib postop, pharmacy consulted to start warfarin for Afib on 9/24. On amiodarone and ampicillin which may increase sensitivity to warfarin.  Acute DVT noted in the left internal jugular and a superficial thrombosis of left upper arm per duplex 9/26. Lovenox 40 mg daily 9/22>>9/24 > IV Heparin on 9/26 > therapeutic Lovenox 9/27>>10/1, when INR therapeutic x 3 consecutive days.   10/14 AM update: INR remains therapeutic at 2.3  Amiodarone 400 mg BID > 200 mg daily on 10/4.  Goal of Therapy:  INR 2-3 Monitor platelets by anticoagulation protocol: Yes   Plan:  Continue Warfarin 4 mg daily. Standing order for PT/INR Mondays and Thursdays CBC on Mondays and Thursdays with bmet.   Greta Doom BS, PharmD, BCPS Clinical Pharmacist 04/24/2023 7:29 AM  Contact: [phone] after 3 PM  "Be curious, not judgmental..." -Debbora Dus

## 2023-04-24 NOTE — Progress Notes (Signed)
Patient ID: Dale Mills, male   DOB: September 26, 1965, 57 y.o.   MRN: [id number]  Transport chair ordered through [employer].

## 2023-04-24 NOTE — Progress Notes (Signed)
Physical Therapy Session Note  Patient Details  Name: Dale Mills MRN: [id number] Date of Birth: [birth year]  Today's Date: 04/24/2023 PT Individual Time: 0915-1026 PT Individual Time Calculation (min): 71 min   Short Term Goals: Week 2:  PT Short Term Goal 1 (Week 2): = LTG's  Skilled Therapeutic Interventions/Progress Updates:      Pt sitting in wheelchair to start. Donned thigh-high compression socks with totalA for time management.   Transported to main rehab gym for time management. Sit<>stand to RW with supervision from w/c - completes without UE support to comply with sternal precautions.   Gait training 269ft with supervision with RW and PT managing IV pole - VC for safety while turning through doorways due to L visual field cut.   Stair training completed using 6" steps and 2 hand rails, able to navigate up.down x12 steps with CGA for safety - forward facing with step-to pattern.   MD present for morning rounding - discussing DC planning as plan is now to DC home instead of a facility - DC date extended to 10/16 with family ed scheduled tomorrow. Scheduler and primary team notified via email.   Ambulated to ortho rehab gym with supervision and RW, ~135ft, similar cues as above. Car transfer completed at setupA level for RW management only - car height replicating his family's mini-van.   BERG balance test completed with results described below. Seated rest breaks as needed while testing.   Patient demonstrates increased fall risk as noted by score of   36/56 on Berg Balance Scale.  (<36= high risk for falls, close to 100%; 37-45 significant >80%; 46-51 moderate >50%; 52-55 lower >25%)  Pt returned to his room and left sitting in wheelchair with safety belt alarm on and call bell within reach. All needs met.   Therapy Documentation Precautions:  Precautions Precautions: Sternal, Fall Precaution Comments: Verbal education provided during eval. Pt able to verbalize he  "doesn't need to open up the incision w/ movement" Restrictions Weight Bearing Restrictions: No RUE Weight Bearing: Weight bearing as tolerated LUE Weight Bearing: Weight bearing as tolerated Other Position/Activity Restrictions: sternal precaution  Balance: Balance Balance Assessed: Yes Standardized Balance Assessment Standardized Balance Assessment: Berg Balance Test Berg Balance Test Sit to Stand: Able to stand without using hands and stabilize independently Standing Unsupported: Able to stand safely 2 minutes Sitting with Back Unsupported but Feet Supported on Floor or Stool: Able to sit safely and securely 2 minutes Stand to Sit: Sits safely with minimal use of hands Transfers: Able to transfer safely, minor use of hands Standing Unsupported with Eyes Closed: Able to stand 10 seconds with supervision Standing Ubsupported with Feet Together: Able to place feet together independently and stand for 1 minute with supervision From Standing, Reach Forward with Outstretched Arm: Reaches forward but needs supervision From Standing Position, Pick up Object from Floor: Able to pick up shoe, needs supervision From Standing Position, Turn to Look Behind Over each Shoulder: Needs supervision when turning Turn 360 Degrees: Needs close supervision or verbal cueing Standing Unsupported, Alternately Place Feet on Step/Stool: Able to complete >2 steps/needs minimal assist Standing Unsupported, One Foot in Front: Able to take small step independently and hold 30 seconds Standing on One Leg: Tries to lift leg/unable to hold 3 seconds but remains standing independently Total Score: 36/56 Static Sitting Balance    Therapy/Group: Individual Therapy  Tyr Franca P Renne Cornick 04/24/2023, 10:29 AM

## 2023-04-24 NOTE — Progress Notes (Signed)
Patient ID: Dale Mills, male   DOB: 1966-05-21, 57 y.o.   MRN: [id number]  TTB ordered through [employer].

## 2023-04-24 NOTE — Progress Notes (Signed)
Patient ID: Dale Mills, male   DOB: 1966-03-23, 57 y.o.   MRN: [id number]  Sw met with patient to discuss extension and inform him on family education arranged for tomorrow.

## 2023-04-24 NOTE — Telephone Encounter (Signed)
Error

## 2023-04-24 NOTE — Progress Notes (Signed)
Occupational Therapy Session Note  Patient Details  Name: Dale Mills MRN: [id number] Date of Birth: Feb 08, 1966  Today's Date: 04/24/2023 OT Individual Time: 1120-1200 OT Individual Time Calculation (min): 40 min  OT Individual Time: 1346-1416 OT Individual Time Calculation (min): 30 min   Short Term Goals: Week 2:  OT Short Term Goal 1 (Week 2): STG=LTGs d/t Pt ELOS  Skilled Therapeutic Interventions/Progress Updates:     AM Session: Pt received sitting up in wc, dressed and ready for the day with all BADL needs met. Pt presenting to be in good spirits receptive to skilled OT session reporting 0/10 pain- OT offering intermittent rest breaks, repositioning, and therapeutic support to optimize participation in therapy session. Pt requesting to use urinal at beginning of therapy session- pt able to independently position and utilize urinal in seated position (documented in flowsheets). Transported Pt total A to tub room in wc for energy conservation. Engaged pt in d/c planning discussion with focus on DME needs and home accessibility. Pt reporting he uses a tub shower at home and owns a shower chair, however it does not fit well into the shower d/t shape of shower seat. Provided education on TTB set-up and transfer technique with emphasis on increased safety and skin breakdown prevention. Pt continues to demonstrate challenges with single leg balance and would benefit from TTB vs shower seat to increase safety and prevent falls with Pt receptive to plan. Pt able to complete ambulatory transfer wc<>TTB with CGA for balance and min verbal cues for technique d/t first learning experience. Engaged Pt in completing functional mobility through busy hospital hallways for endurance training, functional mobility training, safety awareness, and simulated home/community mobility. Pt able to complete 2 trials of 100 ft and 200 ft with close supervision to CGA provided fro balance when changing directions.  Transported Pt back to room total A in wc. Pt was left resting in c with call bell in reach, seatbelt alarm on, and all needs met.    PM Session: Pt received sitting up in wc presenting to be in good spirits receptive to skilled OT session reporting 0/10 pain- OT offering intermittent rest breaks, repositioning, and therapeutic support to optimize participation in therapy session. Engaged Pt in completing functional mobility to therapy gym using RW for endurance and functional mobility training. Pt able to ambulate ~150 ft with close supervision and min verbal cues for L side attention to avoid obstacles. Pt completed ambulatory transfer to NuStep using RW with CGA. Engaged Pt in completing endurance ans strength training on NuStep to increase activity tolerance for BADLs and functional mobility. Pt able to tolerate completing 15 minutes at level 6 setting at 60 steps/min. Pt with significantly improved activity tolerance this session. Utilized BORG Perceived Exertion Scale to measure Pt's percieved exertion level with Pt reporting working at level 11 (light work) at minute 6 and level 13 (somewhat hard) at minute 15. Pt requesting BR completing functional mobility using RW to BR with close supervision and standing to void with CGA provided for balance. Pt ambulated back to room with close supervision provided for safety. Pt was left resting in wc with call bell in reach, seatbelt alarm on, and all needs met.    Therapy Documentation Precautions:  Precautions Precautions: Sternal, Fall Precaution Comments: Verbal education provided during eval. Pt able to verbalize he "doesn't need to open up the incision w/ movement" Restrictions Weight Bearing Restrictions: No RUE Weight Bearing: Weight bearing as tolerated LUE Weight Bearing: Weight bearing as tolerated  Other Position/Activity Restrictions: sternal precaution   Therapy/Group: Individual Therapy  Clide Deutscher 04/24/2023, 12:17 PM

## 2023-04-24 NOTE — Progress Notes (Signed)
Occupational Therapy Discharge Summary  Patient Details  Name: Dale Mills MRN: [id number] Date of Birth: 22-Aug-1965  Date of Discharge from OT service:April 24, 2023  {CHL IP REHAB OT TIME CALCULATIONS:304400400}   Patient has met {NUMBERS 0-12:18577} of {NUMBERS 0-12:18577} long term goals due to improved activity tolerance, improved balance, postural control, ability to compensate for deficits, functional use of  LEFT upper and LEFT lower extremity, improved attention, improved awareness, and improved coordination.  Patient to discharge at overall Supervision level.  Patient's care partner is independent to provide the necessary physical and cognitive assistance at discharge.    Reasons goals not met: All goals met  Recommendation:  Patient will benefit from ongoing skilled OT services in home health setting to continue to advance functional skills in the area of BADL, iADL, and Reduce care partner burden.  Equipment: {equipment:3041657}  Reasons for discharge: treatment goals met and discharge from hospital  Patient/family agrees with progress made and goals achieved: Yes  OT Discharge Skilled Therapeutic Interventions/Progress Updates:  AM Session:  Pt received *** presenting to be in good spirits receptive to skilled OT session reporting ***/10 pain- OT offering intermittent rest breaks, repositioning, and therapeutic support to optimize participation in therapy session.   Pt was left resting in *** with call bell in reach, *** alarm on, and all needs met.    PM Session:  Pt received *** presenting to be in good spirits receptive to skilled OT session reporting ***/10 pain- OT offering intermittent rest breaks, repositioning, and therapeutic support to optimize participation in therapy session.   Pt was left resting in *** with call bell in reach, *** alarm on, and all needs met.   Precautions/Restrictions  Restrictions Weight Bearing Restrictions: No General    Vital Signs Therapy Vitals Temp: 98 F (36.7 C) Temp Source: Oral Pulse Rate: 97 Resp: 18 BP: 118/66 Patient Position (if appropriate): Lying Oxygen Therapy SpO2: 97 % O2 Device: Room Air Pain Pain Assessment Pain Scale: 0-10 Pain Score: 0-No pain ADL ADL Eating: Set up, Moderate cueing Where Assessed-Eating: Chair Grooming: Setup, Moderate cueing Where Assessed-Grooming: Sitting at sink Upper Body Bathing: Setup, Minimal cueing Where Assessed-Upper Body Bathing: Sitting at sink Lower Body Bathing: Maximal assistance Where Assessed-Lower Body Bathing: Sitting at sink, Standing at sink Upper Body Dressing: Minimal assistance Where Assessed-Upper Body Dressing: Sitting at sink Lower Body Dressing: Maximal assistance Where Assessed-Lower Body Dressing: Sitting at sink, Standing at sink Toileting: Maximal assistance Where Assessed-Toileting: Teacher, adult education: Curator Method: Proofreader: Bedside commode, Chiropractor Transfer: Not assessed Film/video editor: Not assessed Vision   Perception    Praxis   Cognition   Sensation   Motor    Mobility     Trunk/Postural Assessment     Balance   Extremity/Trunk Assessment       [name] 04/24/2023, 8:00 AM

## 2023-04-25 ENCOUNTER — Encounter: Payer: Self-pay | Admitting: Oncology

## 2023-04-25 ENCOUNTER — Other Ambulatory Visit (HOSPITAL_COMMUNITY): Payer: Self-pay

## 2023-04-25 LAB — GLUCOSE, CAPILLARY
Glucose-Capillary: 95 mg/dL (ref 70–99)
Glucose-Capillary: 86 mg/dL (ref 70–99)
Glucose-Capillary: 115 mg/dL — ABNORMAL HIGH (ref 70–99)
Glucose-Capillary: 147 mg/dL — ABNORMAL HIGH (ref 70–99)

## 2023-04-25 LAB — PROTIME-INR
INR: 2.3 — ABNORMAL HIGH (ref 0.8–1.2)
Prothrombin Time: 25.6 s — ABNORMAL HIGH (ref 11.4–15.2)

## 2023-04-25 MED ORDER — AMIODARONE HCL 400 MG PO TABS
200.0000 mg | ORAL_TABLET | Freq: Every day | ORAL | 0 refills | Status: DC
  Filled 2023-04-25: qty 30, 60d supply, fill #0

## 2023-04-25 MED ORDER — ASPIRIN 81 MG PO TBEC
81.0000 mg | DELAYED_RELEASE_TABLET | Freq: Every day | ORAL | 0 refills | Status: AC
  Filled 2023-04-25: qty 100, 100d supply, fill #0

## 2023-04-25 MED ORDER — TORSEMIDE 20 MG PO TABS
20.0000 mg | ORAL_TABLET | Freq: Every day | ORAL | 0 refills | Status: DC
  Filled 2023-04-25: qty 30, 30d supply, fill #0

## 2023-04-25 MED ORDER — ATORVASTATIN CALCIUM 80 MG PO TABS
80.0000 mg | ORAL_TABLET | Freq: Every day | ORAL | 0 refills | Status: DC
  Filled 2023-04-25: qty 30, 30d supply, fill #0

## 2023-04-25 MED ORDER — TRAZODONE HCL 50 MG PO TABS
50.0000 mg | ORAL_TABLET | Freq: Every day | ORAL | 0 refills | Status: AC
  Filled 2023-04-25: qty 30, 30d supply, fill #0

## 2023-04-25 MED ORDER — PANTOPRAZOLE SODIUM 40 MG PO TBEC
40.0000 mg | DELAYED_RELEASE_TABLET | Freq: Every day | ORAL | 0 refills | Status: AC
  Filled 2023-04-25: qty 30, 30d supply, fill #0

## 2023-04-25 MED ORDER — POTASSIUM CHLORIDE CRYS ER 10 MEQ PO TBCR
10.0000 meq | EXTENDED_RELEASE_TABLET | Freq: Every day | ORAL | 0 refills | Status: DC
  Filled 2023-04-25: qty 30, 30d supply, fill #0

## 2023-04-25 MED ORDER — TAMSULOSIN HCL 0.4 MG PO CAPS
0.4000 mg | ORAL_CAPSULE | Freq: Every day | ORAL | 0 refills | Status: AC
  Filled 2023-04-25: qty 30, 30d supply, fill #0

## 2023-04-25 MED ORDER — EZETIMIBE 10 MG PO TABS
10.0000 mg | ORAL_TABLET | Freq: Every day | ORAL | 0 refills | Status: DC
Start: 2023-04-25 — End: 2023-05-26
  Filled 2023-04-25: qty 30, 30d supply, fill #0

## 2023-04-25 MED ORDER — WARFARIN SODIUM 4 MG PO TABS
4.0000 mg | ORAL_TABLET | Freq: Every day | ORAL | 0 refills | Status: DC
  Filled 2023-04-25: qty 30, 30d supply, fill #0

## 2023-04-25 MED ORDER — FE FUM-VIT C-VIT B12-FA 460-60-0.01-1 MG PO CAPS
1.0000 | ORAL_CAPSULE | Freq: Every day | ORAL | 0 refills | Status: AC
  Filled 2023-04-25: qty 30, 30d supply, fill #0

## 2023-04-25 MED ORDER — ASCORBIC ACID 500 MG PO TABS
500.0000 mg | ORAL_TABLET | Freq: Every day | ORAL | 0 refills | Status: AC
  Filled 2023-04-25: qty 30, 30d supply, fill #0

## 2023-04-25 MED ORDER — METOPROLOL TARTRATE 25 MG PO TABS
12.5000 mg | ORAL_TABLET | Freq: Two times a day (BID) | ORAL | 0 refills | Status: DC
  Filled 2023-04-25: qty 30, 30d supply, fill #0

## 2023-04-25 MED ORDER — AMIODARONE HCL 200 MG PO TABS
200.0000 mg | ORAL_TABLET | Freq: Every day | ORAL | 0 refills | Status: DC
  Filled 2023-04-25: qty 30, 30d supply, fill #0

## 2023-04-25 NOTE — Progress Notes (Signed)
Patient ID: Dale Mills, male   DOB: 07-Feb-1966, 57 y.o.   MRN: [id number]  RW ordered through [employer].

## 2023-04-25 NOTE — Progress Notes (Signed)
Speech Language Pathology Discharge Summary  Patient Details  Name: Dale Mills MRN: [id number] Date of Birth: 1966-06-06  Date of Discharge from SLP service:April 25, 2023  Today's Date: 04/25/2023 SLP Individual Time: 1300-1330 SLP Individual Time Calculation (min): 30 min   Skilled Therapeutic Interventions:   Skilled therapy session focused on family education and re-evaluation of cognitive skills. Patient re-evaluated using the Cognistat and scored WFL on all subtest's despite deficits in visual spatial skills and registration. Patient with improvements in memory, attention, and calculations since admission. SLP educated patients daughters on importance of continuing to work on problem solving and memory skills through abstract and functional activities. SLP provided examples of tasks including word searches, puzzles, and memory log. Patient left in chair with alarm set and call bell in reach. Continue POC.   Patient has met 3 of 4 long term goals.  Patient to discharge at overall Supervision;Min;Modified Independent level.   Reasons goals not met: continues to require minA during complex problem solving tasks   Clinical Impression/Discharge Summary:  Pt has made good gains and has met 3 of 4 LTG's this admission due to improved speech intelligibility, memory and attention. Pt is currently an overall modiA-minA for cognitive tasks. Patient benefits from modiA to utilize light house scanning strategies during functional tasks to increase awareness of L side and supervision A to recall and utilize memory strategies. Patient continues to require minA during problem solving tasks, therefore did not meet LTG this admission. Over the course of admission, patient with increased speech intelligibility reaching ~95% during conversation with modiA. Patients family reports speech is at baseline. Pt/family education complete and pt will discharge home with intermittent supervision from  friends/family/etc. Pt would benefit from [facility] f/u ST services to maximize cognition as patient will not receive 24 hour supervision.     Care Partner:  Caregiver Able to Provide Assistance: Yes  Type of Caregiver Assistance: Physical;Cognitive  Recommendation:  24 hour supervision/assistance;Home Health SLP (recommend HH services to address problem solving/safety due to limited supervision at d/c)  Rationale for SLP Follow Up: Maximize cognitive function and independence   Equipment: none   Reasons for discharge: Discharged from hospital   Patient/Family Agrees with Progress Made and Goals Achieved: Yes    Trae Bovenzi M.A., CF-SLP 04/25/2023, 3:47 PM

## 2023-04-25 NOTE — Progress Notes (Signed)
Patient ID: Dale Mills, male   DOB: Feb 03, 1966, 57 y.o.   MRN: [id number]  Sw met with pt and daughter in the room to address any current questions or concerns. None currently. Sw informed pt and daughter that Pam from [facility] would be swinging by soon to provide IV ABX education. Daughters are in the process of obtaining supervision support from neighbors and potentially taking off time for work. SW contact information provided for additional questions or concerns.

## 2023-04-25 NOTE — Progress Notes (Addendum)
PROGRESS NOTE   Subjective/Complaints:  On Amp until 11/2 , needs continuous IV for this and IV pole , ceftriaxone through 11/1  No impulsivity noted in room, pt aware of deficits    ROS: Positives per HPI above. Denies fevers, chills, N/V, abdominal pain, SOB, chest pain, new weakness or paraesthesias.    Objective:   No results found. Recent Labs    04/24/23 1839  WBC 8.5  HGB 10.1*  HCT 33.2*  PLT 353    Recent Labs    04/24/23 1839  NA 136  K 3.6  CL 101  CO2 26  GLUCOSE 148*  BUN 19  CREATININE 0.89  CALCIUM 9.3      Intake/Output Summary (Last 24 hours) at 04/25/2023 0940 Last data filed at 04/25/2023 0920 Gross per 24 hour  Intake 1080 ml  Output 2775 ml  Net -1695 ml        Physical Exam: Vital Signs Blood pressure 116/84, pulse 86, temperature 98.1 F (36.7 C), resp. rate 18, height 6' (1.829 m), weight 106.1 kg, SpO2 97%.   General: No acute distress. Sitting at bedside.  Surrounded by children, wife. Mood and affect are appropriate Eyes: EOMI. + left  field cut/homonymous hemianopsia, Heart: Regular rate and rhythm no rubs murmurs or extra sounds Lungs: Clear to auscultation, breathing unlabored, no rales or wheezes Abdomen: Positive bowel sounds, soft nontender to palpation, nondistended Extremities: No clubbing, cyanosis. L ankle trace edema, R ankle 0-unchanged Skin: No evidence of breakdown, no evidence of rash, chest incision CDI Neurologic: AAOx3.  Mild cognitive deficits. + Left hemineglect,and left HH Moving all 4 extremities antigravity and against resistance Prior exam:  Cranial nerves II through XII intact, motor strength is 5/5 in bilateral deltoid, bicep, tricep, grip, 4/5 hip flexor, knee extensors, ankle dorsiflexor and plantar flexor Neurological exam stable today 04/25/2023  Left visual field cut with neglect partially compensated Musculoskeletal: Full range of  motion in all 4 extremities. No joint swelling    Assessment/Plan: 1. Functional deficits which require 3+ hours per day of interdisciplinary therapy in a comprehensive inpatient rehab setting. Physiatrist is providing close team supervision and 24 hour management of active medical problems listed below. Physiatrist and rehab team continue to assess barriers to discharge/monitor patient progress toward functional and medical goals  Care Tool:  Bathing    Body parts bathed by patient: Left arm, Chest, Right arm, Abdomen, Front perineal area, Buttocks, Face, Right upper leg, Left upper leg, Right lower leg, Left lower leg   Body parts bathed by helper: Right lower leg, Left lower leg     Bathing assist Assist Level: Supervision/Verbal cueing     Upper Body Dressing/Undressing Upper body dressing   What is the patient wearing?: Pull over shirt    Upper body assist Assist Level: Supervision/Verbal cueing    Lower Body Dressing/Undressing Lower body dressing      What is the patient wearing?: Pants     Lower body assist Assist for lower body dressing: Supervision/Verbal cueing     Toileting Toileting    Toileting assist Assist for toileting: Contact Guard/Touching assist     Transfers Chair/bed transfer  Transfers assist  Chair/bed transfer assist level: Supervision/Verbal cueing     Locomotion Ambulation   Ambulation assist      Assist level: Supervision/Verbal cueing Assistive device: Walker-rolling Max distance: 150   Walk 10 feet activity   Assist     Assist level: Supervision/Verbal cueing Assistive device: Walker-rolling   Walk 50 feet activity   Assist Walk 50 feet with 2 turns activity did not occur: Safety/medical concerns  Assist level: Supervision/Verbal cueing Assistive device: Walker-rolling    Walk 150 feet activity   Assist Walk 150 feet activity did not occur: Safety/medical concerns  Assist level: Supervision/Verbal  cueing Assistive device: Walker-rolling    Walk 10 feet on uneven surface  activity   Assist Walk 10 feet on uneven surfaces activity did not occur: Safety/medical concerns   Assist level: Contact Guard/Touching assist Assistive device: Walker-rolling   Wheelchair     Assist Is the patient using a wheelchair?: No Type of Wheelchair: Manual    Wheelchair assist level: Dependent - Patient 0%      Wheelchair 50 feet with 2 turns activity    Assist        Assist Level: Dependent - Patient 0%   Wheelchair 150 feet activity     Assist      Assist Level: Dependent - Patient 0%   Blood pressure 116/84, pulse 86, temperature 98.1 F (36.7 C), resp. rate 18, height 6' (1.829 m), weight 106.1 kg, SpO2 97%.   Medical Problem List and Plan: 1. 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             -patient may  shower             -ELOS/Goals: 10/16, PT/OT/SLP Sup may be ok in [facility] for short periods  (?1-2h) wouyld like therapy input , plan is now return to home No driving until left neglect and visual field deficits improved, will need formal visual field testing at optho office in ~4-5 mo  -Continue CIR therapies including PT, OT, and SLP ,   2.  L-internal jugular DVT/ A fib/Antithrombotics: -DVT/anticoagulation:  Pharmaceutical:LUE cephalic and internal jugular DVT Coumadin and Heparin bridge to change to lovenox at discharge             -antiplatelet therapy: ASA 3. Pain Management:  oxycodone or tramadol prn for pain.  4. Mood/Behavior/Sleep: LCSW to follow for evaluation and support.              -antipsychotic agents: N/A             -- Order melatonin prn  for insomnia.  Scheduled trazodone nightly   -reports reasonable sleep 5. Neuropsych/cognition: This patient is capable of making decisions on his own behalf. 6. Skin/Wound Care: Routine pressure relief measures.  7. Fluids/Electrolytes/Nutrition: Strict I/O. Check CMET in am.  8. 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 Fluid overload/SOB: Lasix 40 mg decreased to daily on 09/25 --developed cough last nigh with SOB 09/27 pm.  --daily weights with monitoring for signs of overload. .  -wt stable, monitor  Edema in legs will give IV lasix x 1 today.  CXR unchanged, cards to f/u Wts are stable and actually decreased today 10/5    Filed Weights   04/23/23 0500 04/24/23 0710 04/25/23 0500  Weight: 106 kg 105 kg 106.1 kg  Repeat IV lasix this am 10/4-appreciate cardiology consult  10/5 echo demonstrates large pericardial effusion, likely post-op -card recommended followng CRP and ESR--both are elevated. If they trend up then potentially colchicine rx.    -*iv lasix daily thru Monday then convert to torsemide 10/8 per cards   -*f/u crp and esr ordered for Monday   -no complaints today   -bp's holding Reduced edema on exam , no SOB, endurance in therapy improving -cont torsemide 20mg  every day Clinically resolved exercise tolerance improving   10. A fib: Continue on amiodarone 200 mg every day and coumadin 11. Pre diabetes/Stress induced hyperglycemia: Monitor BS ac/hs.  --Hgb A1C 6.2 (prediabetic). --diet changed to CM from regular. Continue Levemir 8 units daily with SSI prn for tighter BS control. .  --Transition insulin to metformin as intake improves.   -9/30 decrease levemir to 6 u - 10/13: Educated patient on likely need for long-acting insulin on discharge, and role of long-acting versus short acting CBG (last 3)  Recent Labs     04/24/23 1854 04/24/23 2156 04/25/23 0608  GLUCAP 134* 113* 95  10/14 controlled   12 ABLA:   Monitor for signs of bleeding             --transfuse prn <7.0 or if symptomatic. Slowly improving at 8.4  Improving   CBC    Component Value Date/Time   WBC 8.5 04/24/2023 1839   RBC 3.84 (L) 04/24/2023 1839   HGB 10.1 (L) 04/24/2023 1839   HCT 33.2 (L) 04/24/2023 1839   PLT 353 04/24/2023 1839   MCV 86.5 04/24/2023 1839   MCH 26.3 04/24/2023 1839   MCHC 30.4 04/24/2023 1839   RDW 16.3 (H) 04/24/2023 1839    Recheck Hgb 1 wk    13. Constipation: Resolved and now w/diarrhea. Will hold laxative.    -9/30 frequent BM not liquid, continue to hold laxatives   - LBM 10/13 14. Hyponatremia,mild  -10/7 Na stable at 137  15.  Hypo K+   -KCL currently daily repeat BMET on Monday or as per cardiology  -10/7 K+ up to 4.0, improved will decrease to daily, recheck 3.7 cont KCL daily   16.  Left homonymous hemianopsia.  Discussed with patient and family no return to driving on discharge from the hospital. Will monitor clinically as OP  LOS: 16 days A FACE TO FACE EVALUATION WAS PERFORMED  Erick Colace 04/25/2023, 9:40 AM

## 2023-04-25 NOTE — Progress Notes (Addendum)
Patient ID: Dale Mills, male   DOB: 1965-09-29, 57 y.o.   MRN: [id number]   10/14  7:28 PM: Sw received update from covering SW. Primary SW reached out to daughter, [name] and left a detailed VM to discuss family education and discharge. SW sent text to confirm family education and discharge with daughter, [name].   7:33 PM: SW reached out to daughter, [name] to confirm family and education and discharge. Daughter, [name] aware and conference sister [name] in to confirm.  [name] reports that she received VM from SW. Daughter, Hospital doctor expressed that she is aware but her concern is being unable to assist patient at home. Daughter, [name] is concerned about patient discharging home with her due to her and her spouses disability. SW expressed to daughters, [name] and [name] that currently the only option patient has currently is to d/c home due to barriers with placement and unable to private pay for placement. Daughter, [name] expressed her understanding and shared that their biggest concern is support for 24/7 supervision. [name] expressed that they have reached out to family over the weekend and family is unable to assist with 24/7 supervision. Daughters are unable to pay for [facility] services to support them with supervision.  Daughters expressed their awareness of the situation and shared that would try their best to work out 24/7 supervision at home.  Daughters inquired about the potential of recommendations being changed for d/c at home. Daughters will be present for family education today and SW will have therapist discuss their recommendations with daughters.   SW discussed with daughters the importance of reaching out to caseworker to have managed Medicaid switched to traditional Medicaid of [place]/Disability. SW informed daughters that this would provide with patient with additional services/resources.  10/15  9:30 AM: SW informed the team. The plan is for daughters to complete education and  proceed with discharge tomorrow. SW will follow up with daughters upon family education to address any questions or concerns

## 2023-04-25 NOTE — Progress Notes (Addendum)
Patient ID: Dale Mills, male   DOB: 1965-11-13, 57 y.o.   MRN: [id number]   [facility] referral submitted to [facility]   Patient approved for RN/PT/OT Orders sent.

## 2023-04-25 NOTE — Plan of Care (Signed)
  Problem: RH Problem Solving Goal: LTG Patient will demonstrate problem solving for (SLP) Description: LTG:  Patient will demonstrate problem solving for basic/complex daily situations with cues  (SLP) Outcome: Continues to require minA   Problem: RH Memory Goal: LTG Patient will demonstrate ability for day to day (SLP) Description: LTG:   Patient will demonstrate ability for day to day recall/carryover during cognitive/linguistic activities with assist  (SLP) Outcome: Completed/Met   Problem: RH Attention Goal: LTG Patient will demonstrate this level of attention during functional activites (SLP) Description: LTG:  Patient will will demonstrate this level of attention during functional activites (SLP) Outcome: Completed/Met   Problem: RH Expression Communication Goal: LTG Patient will increase speech intelligibility (SLP) Description: LTG: Patient will increase speech intelligibility at word/phrase/conversation level with cues, % of the time (SLP) Outcome: Completed/Met

## 2023-04-25 NOTE — Progress Notes (Signed)
Inpatient Rehabilitation Care Coordinator Discharge Note   Patient Details  Name: Dale Mills MRN: [id number] Date of Birth: November 01, 1965   Discharge location: Home with daughters  Length of Stay: 16 Days  Discharge activity level: Sup  Home/community participation: Daughters, Music therapist  Patient response WU:[id number] Literacy - How often do you need to have someone help you when you read instructions, pamphlets, or other written material from your doctor or pharmacy?: Rarely  Patient response YN:[id number] Isolation - How often do you feel lonely or isolated from those around you?: Never  Services provided included: SW, Neuropsych, Pharmacy, TR, CM, SLP, RN, OT, PT, RD, MD  Financial Services:  Financial Services Utilized: Medicaid    Choices offered to/list presented to: patient and daughters  Follow-up services arranged:  Home Health, DME Home Health Agency: [facility] (IV ABX)/PT/OT    DME : [employer]    Patient response to transportation need: Is the patient able to respond to transportation needs?: Yes In the past 12 months, has lack of transportation kept you from medical appointments or from getting medications?: No In the past 12 months, has lack of transportation kept you from meetings, work, or from getting things needed for daily living?: No   Patient/Family verbalized understanding of follow-up arrangements:  Yes  Individual responsible for coordination of the follow-up plan: self, [name] or [name]  Confirmed correct DME delivered: Andria Rhein 04/25/2023    Comments (or additional information):  Summary of Stay    Date/Time Discharge Planning CSW  04/18/23 1237 Patient Medicaid approved. Pt d/c to SNF/ALF. Dtr unable to care for patient. CJB  04/11/23 1521 Patient uninsured with plan to d/c to his daughters home or ALF.  1 level homr, ramped entrance CJB       Andria Rhein

## 2023-04-25 NOTE — Progress Notes (Signed)
Physical Therapy Session Note  Patient Details  Name: Dale Mills MRN: [id number] Date of Birth: [birth year]  Today's Date: 04/25/2023 PT Individual Time: 1121-1201 and 1425 - 1510 PT Individual Time Calculation (min): 40 min and 45 min  Short Term Goals: Week 2:  PT Short Term Goal 1 (Week 2): = LTG's  Skilled Therapeutic Interventions/Progress Updates:    Session 1: Pt received in WC, alarm activated, agreeable to PT.  Pt continent to bladder. Use of urinal in sitting for voiding independently.  Don shoes in sitting w/ supervision. STS supervision w/ RW.  Gait training for endurance: - 350 ft supervision w/ RW. Pt demo ability to navigate obstacles and turns w/ no verbal cues and no LOB. He continues to demo the following gait deviations: LLE IR, NBOS, increased velocity w/ fatigue and turning, and decreased step height B. Pt demo excellent scanning with his head both L and R during ambulation to improve navigation of obstacles and safety in a busy environment. - 200 ft, supervision w/ RW. Navigating in transitional living apartment.  - 175 ft HHA minA, +2 for IV management. Pt demo increased L lateral lean/mild LOB but able to self correct.  - 2x10 ft side stepping both to the L & R. CGA. Verbal cues for stepping R foot forward and out when side stepping to the R d/t R trunk rotation. As well as stepping L foot back and out when side stepping to the L d/t R trunk rotation. Pt able to fix trunk rotation w/ above verbal cues. - 2x5 ft backwards walking. Once w/ RW, once w/ no AD. CGA  - 220 ft supervision w/ RW. Navigating tables set up in day room. Continue above gait deviations. - Increased speed during turns resulting in potential LOB. verbal cues given to pt about slowing down, focusing on his balance, being confident, and keeping a wide BOS when navigating turns.   Pt reports not needing the transport chair anymore and is feeling more comfortable with his endurance and mobility.  PT agreeable and notified SW.  Pt educated about how family training will take place this afternoon.  Pt left in WC, alarm activated, all needs met.  Session 2:Pt received in [name], alarm activated, agreeable to PT, family present for training.  Family Training: Discussion about D/C concerns with family members and patient. 1 daughter is a travel Water quality scientist, 1 daughter is WC bound working 8 hours a day, and brother in law(husband of [name] user) is disabled also working 8 hours a day. Family was educated on the need for 24/7 supervision upon home D/C tomorrow until patient gets acclimated and improves in functional mobility in home setting. Family has planned to arrange 24/7 supervision tomorrow through the weekend and are working on [facility] for the following week.   Family walked w/ pt using supervision and RW from room > ortho gym over 220 ft, up & down ramp, and transferred into the car. Family & pt then ambulated supervision w/ RW to main gym > 125 ft, proceeded to navigate stairs w/ CGA, and then ambulated back to room >125 ft supervision w/ RW. Stair navigation first 4 steps BHR alternating steps, last 4 steps RUE HR step to pattern. During ambulation back to room at end of session pt ran into 2 things on his L side and demo delayed processing to navigate around obstacle. Verbal cues given from SPT and daughter to slow down especially during his turns.   Family educated about RW being ordered  for pt safety. As well as recommended HHPT following therapy.  Pt left in recliner, alarm activated, all needs met.  Therapy Documentation Precautions:  Precautions Precautions: Sternal, Fall Precaution Comments: Verbal education provided during eval. Pt able to verbalize he "doesn't need to open up the incision w/ movement" Restrictions Weight Bearing Restrictions: Yes RUE Weight Bearing: Weight bearing as tolerated LUE Weight Bearing: Weight bearing as tolerated Other Position/Activity Restrictions:  sternal precaution  Pain:  Session 1: Pt presents with no pain during session. Session 2: Pt presents with no pain during session.  Therapy/Group: Individual Therapy  [name] 04/25/2023, 8:04 AM

## 2023-04-25 NOTE — Plan of Care (Signed)
  Problem: RH Bed to Chair Transfers Goal: LTG Patient will perform bed/chair transfers w/assist (PT) Description: LTG: Patient will perform bed to chair transfers with assistance (PT). Outcome: Completed/Met Flowsheets (Taken 04/25/2023 1547) LTG: Pt will perform Bed to Chair Transfers with assistance level: Supervision/Verbal cueing   Problem: RH Balance Goal: LTG Patient will maintain dynamic sitting balance (PT) Description: LTG:  Patient will maintain dynamic sitting balance with assistance during mobility activities (PT) Outcome: Completed/Met Flowsheets (Taken 04/25/2023 1547) LTG: Pt will maintain dynamic sitting balance during mobility activities with:: Independent Goal: LTG Patient will maintain dynamic standing balance (PT) Description: LTG:  Patient will maintain dynamic standing balance with assistance during mobility activities (PT) Outcome: Completed/Met Flowsheets (Taken 04/25/2023 1547) LTG: Pt will maintain dynamic standing balance during mobility activities with:: Supervision/Verbal cueing   Problem: Sit to Stand Goal: LTG:  Patient will perform sit to stand with assistance level (PT) Description: LTG:  Patient will perform sit to stand with assistance level (PT) Outcome: Completed/Met Flowsheets (Taken 04/25/2023 1547) LTG: PT will perform sit to stand in preparation for functional mobility with assistance level: Supervision/Verbal cueing   Problem: RH Bed Mobility Goal: LTG Patient will perform bed mobility with assist (PT) Description: LTG: Patient will perform bed mobility with assistance, with/without cues (PT). Outcome: Completed/Met Flowsheets (Taken 04/25/2023 1547) LTG: Pt will perform bed mobility with assistance level of: Contact Guard/Touching assist   Problem: RH Bed to Chair Transfers Goal: LTG Patient will perform bed/chair transfers w/assist (PT) Description: LTG: Patient will perform bed to chair transfers with assistance (PT). Outcome:  Completed/Met Flowsheets (Taken 04/25/2023 1547) LTG: Pt will perform Bed to Chair Transfers with assistance level: Supervision/Verbal cueing   Problem: RH Car Transfers Goal: LTG Patient will perform car transfers with assist (PT) Description: LTG: Patient will perform car transfers with assistance (PT). Outcome: Completed/Met Flowsheets (Taken 04/25/2023 1547) LTG: Pt will perform car transfers with assist:: Supervision/Verbal cueing   Problem: RH Ambulation Goal: LTG Patient will ambulate in controlled environment (PT) Description: LTG: Patient will ambulate in a controlled environment, # of feet with assistance (PT). Outcome: Completed/Met Flowsheets Taken 04/25/2023 1547 LTG: Pt will ambulate in controlled environ  assist needed:: Supervision/Verbal cueing Taken 04/11/2023 1201 LTG: Ambulation distance in controlled environment: 150 Goal: LTG Patient will ambulate in home environment (PT) Description: LTG: Patient will ambulate in home environment, # of feet with assistance (PT). Outcome: Completed/Met Flowsheets Taken 04/25/2023 1547 LTG: Pt will ambulate in home environ  assist needed:: Supervision/Verbal cueing Taken 04/11/2023 1201 LTG: Ambulation distance in home environment: 50

## 2023-04-25 NOTE — Plan of Care (Signed)
  Problem: RH Bathing Goal: LTG Patient will bathe all body parts with assist levels (OT) Description: LTG: Patient will bathe all body parts with assist levels (OT) Outcome: Completed/Met   Problem: RH Dressing Goal: LTG Patient will perform upper body dressing (OT) Description: LTG Patient will perform upper body dressing with assist, with/without cues (OT). Outcome: Completed/Met Goal: LTG Patient will perform lower body dressing w/assist (OT) Description: LTG: Patient will perform lower body dressing with assist, with/without cues in positioning using equipment (OT) Outcome: Completed/Met   Problem: RH Toileting Goal: LTG Patient will perform toileting task (3/3 steps) with assistance level (OT) Description: LTG: Patient will perform toileting task (3/3 steps) with assistance level (OT)  Outcome: Completed/Met   Problem: RH Toilet Transfers Goal: LTG Patient will perform toilet transfers w/assist (OT) Description: LTG: Patient will perform toilet transfers with assist, with/without cues using equipment (OT) Outcome: Completed/Met   Problem: RH Pre-functional/Other (Specify) Goal: RH LTG OT (Specify) 1 Description: RH LTG OT (Specify) 1 Outcome: Completed/Met

## 2023-04-26 ENCOUNTER — Other Ambulatory Visit (HOSPITAL_COMMUNITY): Payer: Self-pay

## 2023-04-26 ENCOUNTER — Encounter: Payer: Self-pay | Admitting: Oncology

## 2023-04-26 LAB — GLUCOSE, CAPILLARY
Glucose-Capillary: 80 mg/dL (ref 70–99)
Glucose-Capillary: 115 mg/dL — ABNORMAL HIGH (ref 70–99)

## 2023-04-26 NOTE — Progress Notes (Signed)
PROGRESS NOTE   Subjective/Complaints:  On Amp until 11/2 , needs continuous IV for this and IV pole , ceftriaxone through 11/1  Looking to left side better, family training with abx administration    ROS: Positives per HPI above. Denies fevers, chills, N/V, abdominal pain, SOB, chest pain, new weakness or paraesthesias.    Objective:   No results found. Recent Labs    04/24/23 1839  WBC 8.5  HGB 10.1*  HCT 33.2*  PLT 353    Recent Labs    04/24/23 1839  NA 136  K 3.6  CL 101  CO2 26  GLUCOSE 148*  BUN 19  CREATININE 0.89  CALCIUM 9.3      Intake/Output Summary (Last 24 hours) at 04/26/2023 0904 Last data filed at 04/26/2023 0834 Gross per 24 hour  Intake 5738.5 ml  Output 3500 ml  Net 2238.5 ml        Physical Exam: Vital Signs Blood pressure 113/81, pulse 88, temperature 98 F (36.7 C), temperature source Oral, resp. rate 18, height 6' (1.829 m), weight 105.5 kg, SpO2 96%.   General: No acute distress. Sitting at bedside.  Surrounded by children, wife. Mood and affect are appropriate Eyes: EOMI. + left  field cut/homonymous hemianopsia, Heart: Regular rate and rhythm no rubs murmurs or extra sounds Lungs: Clear to auscultation, breathing unlabored, no rales or wheezes Abdomen: Positive bowel sounds, soft nontender to palpation, nondistended Extremities: No clubbing, cyanosis. L ankle trace edema, R ankle 0-unchanged Skin: No evidence of breakdown, no evidence of rash, chest incision CDI Neurologic: AAOx3.  Mild cognitive deficits. + Left hemineglect,and left HH Moving all 4 extremities antigravity and against resistance Prior exam:  Cranial nerves II through XII intact, motor strength is 5/5 in bilateral deltoid, bicep, tricep, grip, 4/5 hip flexor, knee extensors, ankle dorsiflexor and plantar flexor Neurological exam stable today 04/26/2023  Left visual field cut with neglect partially  compensated Musculoskeletal: Full range of motion in all 4 extremities. No joint swelling    Assessment/Plan: 1. Functional deficits due to debility endocarditis requiring MVR, AVR, aortic root repair Stable for D/C today F/u PCP in 2 weeks F/u PM&R 3-4weeks F/u ID a per their office  F/u Card F/u CVTS F/u Neuro See D/C summary See D/C instructions  Care Tool:  Bathing    Body parts bathed by patient: Left arm, Chest, Right arm, Abdomen, Front perineal area, Buttocks, Face, Right upper leg, Left upper leg, Right lower leg, Left lower leg   Body parts bathed by helper: Right lower leg, Left lower leg     Bathing assist Assist Level: Supervision/Verbal cueing     Upper Body Dressing/Undressing Upper body dressing   What is the patient wearing?: Pull over shirt    Upper body assist Assist Level: Supervision/Verbal cueing    Lower Body Dressing/Undressing Lower body dressing      What is the patient wearing?: Pants     Lower body assist Assist for lower body dressing: Supervision/Verbal cueing     Toileting Toileting    Toileting assist Assist for toileting: Contact Guard/Touching assist     Transfers Chair/bed transfer  Transfers assist  Chair/bed transfer assist level: Supervision/Verbal cueing     Locomotion Ambulation   Ambulation assist      Assist level: Supervision/Verbal cueing Assistive device: Walker-rolling Max distance: 150   Walk 10 feet activity   Assist     Assist level: Supervision/Verbal cueing Assistive device: Walker-rolling   Walk 50 feet activity   Assist Walk 50 feet with 2 turns activity did not occur: Safety/medical concerns  Assist level: Supervision/Verbal cueing Assistive device: Walker-rolling    Walk 150 feet activity   Assist Walk 150 feet activity did not occur: Safety/medical concerns  Assist level: Supervision/Verbal cueing Assistive device: Walker-rolling    Walk 10 feet on uneven  surface  activity   Assist Walk 10 feet on uneven surfaces activity did not occur: Safety/medical concerns   Assist level: Contact Guard/Touching assist Assistive device: Walker-rolling   Wheelchair     Assist Is the patient using a wheelchair?: No Type of Wheelchair: Manual    Wheelchair assist level: Dependent - Patient 0%      Wheelchair 50 feet with 2 turns activity    Assist        Assist Level: Dependent - Patient 0%   Wheelchair 150 feet activity     Assist      Assist Level: Dependent - Patient 0%   Blood pressure 113/81, pulse 88, temperature 98 F (36.7 C), temperature source Oral, resp. rate 18, height 6' (1.829 m), weight 105.5 kg, SpO2 96%.   Medical Problem List and Plan: 1. 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             -patient may  shower             -ELOS/Goals: 10/16, PT/OT/SLP Sup may be ok in [facility] for short periods  (?1-2h) wouyld like therapy input , plan is now return to home No driving until left neglect and visual field deficits improved, will need formal visual field testing at optho office in ~4-5 mo  -Continue CIR therapies including PT, OT, and SLP ,   2.  L-internal jugular DVT/ A fib/Antithrombotics: -DVT/anticoagulation:  Pharmaceutical:LUE cephalic and internal jugular DVT Coumadin and Heparin bridge to change to lovenox at discharge             -antiplatelet therapy: ASA 3. Pain Management:  oxycodone or tramadol prn for pain.  4. Mood/Behavior/Sleep: LCSW to follow for evaluation and support.              -antipsychotic agents: N/A             -- Order melatonin prn for insomnia.  Scheduled trazodone  nightly   -reports reasonable sleep 5. Neuropsych/cognition: This patient is capable of making decisions on his own behalf. 6. Skin/Wound Care: Routine pressure relief measures.  7. Fluids/Electrolytes/Nutrition: Strict I/O. Check CMET in am.  8. 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 Fluid overload/SOB: Lasix 40 mg decreased to daily on 09/25 --developed cough last nigh with SOB 09/27 pm.  --daily weights with monitoring for signs of overload. Marland Kitchen  -  wt stable, monitor Edema in legs will give IV lasix x 1 today.  CXR unchanged, cards to f/u Wts are stable and actually decreased today 10/5    Filed Weights   04/24/23 0710 04/25/23 0500 04/26/23 0500  Weight: 105 kg 106.1 kg 105.5 kg  Repeat IV lasix this am 10/4-appreciate cardiology consult  10/5 echo demonstrates large pericardial effusion, likely post-op -card recommended followng CRP and ESR--both are elevated. If they trend up then potentially colchicine rx.    -*iv lasix daily thru Monday then convert to torsemide 10/8 per cards   -*f/u crp and esr ordered for Monday   -no complaints today   -bp's holding Reduced edema on exam , no SOB, endurance in therapy improving -cont torsemide 20mg  every day Clinically resolved exercise tolerance improving   10. A fib: Continue on amiodarone 200 mg every day and coumadin 11. Pre diabetes/Stress induced hyperglycemia: Monitor BS ac/hs.  --Hgb A1C 6.2 (prediabetic). --diet changed to CM from regular. Continue Levemir 8 units daily with SSI prn for tighter BS control. .  --Transition insulin to metformin as intake improves.   -9/30 decrease levemir to 6 u - 10/13: Educated patient on likely need for long-acting insulin on discharge, and role of long-acting versus short acting CBG (last 3)  Recent Labs    04/25/23 1623 04/25/23 2057  04/26/23 0550  GLUCAP 115* 147* 80  10/14 controlled   12 ABLA:   Monitor for signs of bleeding             --transfuse prn <7.0 or if symptomatic. Slowly improving at 8.4  Improving   CBC    Component Value Date/Time   WBC 8.5 04/24/2023 1839   RBC 3.84 (L) 04/24/2023 1839   HGB 10.1 (L) 04/24/2023 1839   HCT 33.2 (L) 04/24/2023 1839   PLT 353 04/24/2023 1839   MCV 86.5 04/24/2023 1839   MCH 26.3 04/24/2023 1839   MCHC 30.4 04/24/2023 1839   RDW 16.3 (H) 04/24/2023 1839    Recheck Hgb 1 wk    13. Constipation: Resolved and now w/diarrhea. Will hold laxative.    -9/30 frequent BM not liquid, continue to hold laxatives   - LBM 10/13 14. Hyponatremia,mild  -10/7 Na stable at 137  15.  Hypo K+   -KCL currently daily repeat BMET on Monday or as per cardiology  -10/7 K+ up to 4.0, improved will decrease to daily, recheck 3.7 cont KCL daily   16.  Left homonymous hemianopsia.  Discussed with patient and family no return to driving on discharge from the hospital. Will monitor clinically as OP  LOS: 17 days A FACE TO FACE EVALUATION WAS PERFORMED  Erick Colace 04/26/2023, 9:04 AM

## 2023-04-26 NOTE — Progress Notes (Signed)
Inpatient Rehabilitation Discharge Medication Review by a Pharmacist  A complete drug regimen review was completed for this patient to identify any potential clinically significant medication issues.  High Risk Drug Classes Is patient taking? Indication by Medication  Antipsychotic No   Anticoagulant Yes Warfarin- internal jugular DVT  Antibiotic Yes, as an intravenous medication Ampicillin, rocephin- E. Faecalis endocarditis. End of therapy 05/12/2023  Opioid No   Antiplatelet Yes Aspirin- cva ppx  Hypoglycemics/insulin No   Vasoactive Medication Yes Amiodarone- AF Torsemide, Lopressor- HTN Flomax- BPH  Chemotherapy No   Other Yes Lipitor, zetia- HLD Protonix- GERD     Type of Medication Issue Identified Description of Issue Recommendation(s)  Drug Interaction(s) (clinically significant)     Duplicate Therapy     Allergy     No Medication Administration End Date     Incorrect Dose     Additional Drug Therapy Needed     Significant med changes from prior encounter (inform family/care partners about these prior to discharge).    Other       Clinically significant medication issues were identified that warrant physician communication and completion of prescribed/recommended actions by midnight of the next day:  No    Time spent performing this drug regimen review (minutes):  30   Callan Norden BS, PharmD, BCPS Clinical Pharmacist 04/26/2023 7:22 AM  Contact: [phone] after 3 PM  "Be curious, not judgmental..." -Debbora Dus

## 2023-04-26 NOTE — Progress Notes (Signed)
Patient ID: [place], male   DOB: 15-Feb-1966, 57 y.o.   MRN: [id number]  Sw met with patient to discuss d/c questions or concerns. Patient reports his IV ABX dosage comes around 4 PM.  Patient concerned because he did not order lunch for today, SW will inform nurse. No additional questions or concerns.

## 2023-04-26 NOTE — Progress Notes (Unsigned)
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 Leafy Ro on 03/31/2023. Of note, he had OR culture showed gram positive cocci in pairs. Per neurology, pre op patient had an acute ischemic infarct:  embolic shower with largest at right PCA infarct and small right frontal SAH, etiology: Cardioembolic in the setting of endocarditis. MRI showed confluent right PCA territory infarct, additional similar acute infarcts in bilateral parietal lobes in the cerebellum, similar small volume acute subarachnoid hemorrhage along right frontal convexity. Patient was able to move all extremities post op.  He was discharged to CIR on 04/07/2023. He was then discharged from CIR to home on 04/26/2023. His daughter states he sometimes has ankle/feet swelling. His great toe has blood under the nail (happened at hospital) and he has "tingling in his feet". He denies chest pain, shortness of breath.  Current Outpatient Medications  Medication Sig Dispense Refill   amiodarone (PACERONE) 200 MG tablet Take 1 tablet (200 mg total) by mouth daily. 30 tablet 0   ampicillin IVPB Inject 12 g into the vein daily. As a continuous infusion Indication:  Enterococcus faecalis endocarditis  First Dose: Yes Last Day of Therapy: 05/12/2023 Labs - Once weekly:  CBC/D and BMP, Labs - Once weekly: ESR and CRP Method of administration: Ambulatory Pump (Continuous Infusion) Method of administration may be changed at the discretion of home infusion pharmacist based upon assessment of the patient and/or caregiver's ability to self-administer the medication ordered. 16 Units 0   ascorbic acid (VITAMIN C) 500 MG tablet Take 1 tablet (500 mg total) by mouth daily. 30 tablet 0   aspirin  EC 81 MG tablet Take 1 tablet (81 mg total) by mouth daily. Swallow whole. 100 tablet 0   atorvastatin (LIPITOR) 80 MG tablet Take 1 tablet (80 mg total) by mouth daily. 30 tablet 0   cefTRIAXone (ROCEPHIN) IVPB Inject 2 g into the vein every 12 (twelve) hours. Indication:   Enterococcus faecalis endocarditis  First Dose: Yes Last Day of Therapy:  05/12/2023 Labs - Once weekly:  CBC/D and BMP, Labs - Once weekly: ESR and CRP Method of administration: IV Push Method of administration may be changed at the discretion of home infusion pharmacist based upon assessment of the patient and/or caregiver's ability to self-administer the medication ordered. 32 Units 0   ezetimibe (ZETIA) 10 MG tablet Take 1 tablet (10 mg total) by mouth daily. 30 tablet 0   Fe Fum-Vit C-Vit B12-FA (TRIGELS-F FORTE) CAPS capsule Take 1 capsule by mouth daily after breakfast. 30 capsule 0   metoprolol tartrate (LOPRESSOR) 25 MG tablet Take 0.5 tablets (12.5 mg total) by mouth 2 (two) times daily. 30 tablet 0   pantoprazole (PROTONIX) 40 MG tablet Take 1 tablet (40 mg total) by mouth daily. 30 tablet 0   potassium chloride (KLOR-CON M) 10 MEQ tablet Take 1 tablet (10 mEq total) by mouth daily. 30 tablet 0   tamsulosin (FLOMAX) 0.4 MG CAPS capsule Take 1 capsule (0.4 mg total) by mouth daily after supper. 30 capsule 0   torsemide (DEMADEX) 20 MG tablet Take 1 tablet (20 mg total) by mouth daily. 30 tablet 0   traZODone (DESYREL) 50  MG tablet Take 1 tablet (50 mg total) by mouth at bedtime. 30 tablet 0   warfarin (COUMADIN) 4 MG tablet Take 1 tablet (4 mg total) by mouth daily at 4 PM. 30 tablet 0   Facility-Administered Medications Ordered in Other Visits  Medication Dose Route Frequency Provider Last Rate Last Admin   (feeding supplement) PROSource Plus liquid 30 mL  30 mL Oral BID BM Love, Pamela S, PA-C   30 mL at 04/26/23 0943   acetaminophen (TYLENOL) tablet 325-650 mg  325-650 mg Oral Q4H PRN Jacquelynn Cree, PA-C        alum & mag hydroxide-simeth (MAALOX/MYLANTA) 200-200-20 MG/5ML suspension 30 mL  30 mL Oral Q4H PRN Love, Pamela S, PA-C       amiodarone (PACERONE) tablet 200 mg  200 mg Oral Daily Erick Colace, MD   200 mg at 04/26/23 0943   ampicillin (OMNIPEN) 2 g in sodium chloride 0.9 % 100 mL IVPB  2 g Intravenous Q4H Erick Colace, MD 300 mL/hr at 04/26/23 0634 2 g at 04/26/23 1610   ascorbic acid (VITAMIN C) tablet 500 mg  500 mg Oral Daily Jacquelynn Cree, PA-C   500 mg at 04/26/23 9604   aspirin EC tablet 81 mg  81 mg Oral Daily Jacquelynn Cree, PA-C   81 mg at 04/26/23 0943   atorvastatin (LIPITOR) tablet 80 mg  80 mg Oral Daily Jacquelynn Cree, PA-C   80 mg at 04/26/23 5409   bisacodyl (DULCOLAX) suppository 10 mg  10 mg Rectal Daily PRN Love, Pamela S, PA-C       cefTRIAXone (ROCEPHIN) 2 g in sodium chloride 0.9 % 100 mL IVPB  2 g Intravenous Q12H Erick Colace, MD 200 mL/hr at 04/26/23 0954 2 g at 04/26/23 8119   Chlorhexidine Gluconate Cloth 2 % PADS 6 each  6 each Topical Q12H Erick Colace, MD   6 each at 04/26/23 0518   diphenhydrAMINE (BENADRYL) capsule 25 mg  25 mg Oral Q6H PRN Love, Pamela S, PA-C       ezetimibe (ZETIA) tablet 10 mg  10 mg Oral Daily Delle Reining S, PA-C   10 mg at 04/26/23 1478   Fe Fum-Vit C-Vit B12-FA (TRIGELS-F FORTE) capsule 1 capsule  1 capsule Oral QPC breakfast Jacquelynn Cree, PA-C   1 capsule at 04/26/23 0943   guaiFENesin-dextromethorphan (ROBITUSSIN DM) 100-10 MG/5ML syrup 5-10 mL  5-10 mL Oral Q6H PRN Jacquelynn Cree, PA-C   10 mL at 04/12/23 0323   insulin aspart (novoLOG) injection 2-6 Units  2-6 Units Subcutaneous TID AC & HS Jacquelynn Cree, PA-C   2 Units at 04/25/23 2311   melatonin tablet 3 mg  3 mg Oral QHS PRN Fanny Dance, MD       metoprolol tartrate (LOPRESSOR) tablet 12.5 mg  12.5 mg Oral BID Love, Pamela S, PA-C   12.5 mg at 04/26/23 2956   nutrition supplement (JUVEN) (JUVEN) powder packet 1 packet  1 packet Oral BID BM Jacquelynn Cree, PA-C   1 packet at 04/26/23 2130   Oral care mouth rinse  15 mL Mouth Rinse 4 times per day Jacquelynn Cree, PA-C   15 mL at 04/26/23 8657   oxyCODONE (Oxy IR/ROXICODONE) immediate release tablet 5-10 mg  5-10 mg Oral Q3H PRN Jacquelynn Cree, PA-C   10 mg at 04/20/23 2144   pantoprazole (PROTONIX) EC tablet 40 mg  40 mg Oral Daily Love,  Evlyn Kanner, PA-C   40 mg at 04/26/23 0943   potassium chloride (KLOR-CON M) CR tablet 10 mEq  10 mEq Oral Daily Fanny Dance, MD   10 mEq at 04/26/23 6045   prochlorperazine (COMPAZINE) tablet 5-10 mg  5-10 mg Oral Q6H PRN Love, Pamela S, PA-C       Or   prochlorperazine (COMPAZINE) suppository 12.5 mg  12.5 mg Rectal Q6H PRN Love, Pamela S, PA-C       Or   prochlorperazine (COMPAZINE) injection 5-10 mg  5-10 mg Intravenous Q6H PRN Love, Pamela S, PA-C   5 mg at 04/10/23 1026   protein supplement (ENSURE MAX) liquid  11 oz Oral BID Jacquelynn Cree, PA-C   11 oz at 04/26/23 0948   sodium chloride flush (NS) 0.9 % injection 10-40 mL  10-40 mL Intracatheter Q12H Kirsteins, Victorino Sparrow, MD   10 mL at 04/18/23 2213   sodium chloride flush (NS) 0.9 % injection 10-40 mL  10-40 mL Intracatheter PRN Erick Colace, MD   10 mL at 04/18/23 0804   sodium phosphate (FLEET) enema 1 enema  1 enema Rectal Once PRN Love, Pamela S, PA-C       tamsulosin (FLOMAX) capsule 0.4 mg  0.4 mg Oral QPC supper Erick Colace, MD   0.4 mg at 04/25/23 1810   torsemide (DEMADEX) tablet 20 mg  20 mg Oral Daily Jodelle Red, MD   20 mg at 04/26/23 0943   traMADol (ULTRAM) tablet 50-100 mg  50-100 mg Oral Q4H PRN Love, Pamela S, PA-C       traZODone (DESYREL) tablet 50 mg  50 mg Oral QHS Fanny Dance, MD   50 mg at 04/25/23 2018   warfarin (COUMADIN) tablet 4 mg  4 mg Oral q1600 Erick Colace, MD   4 mg at 04/25/23 1624   Warfarin - Pharmacist Dosing Inpatient   Does not apply [id number] Jacquelynn Cree, PA-C   Given at 04/22/23 1541  Vital Signs: Vitals:    05/01/23 1514  BP: 115/78  Pulse: 96  Resp: 18  SpO2: 95%       Physical Exam: CV-RRR, no murmur Pulmonary-Clear on the right and slightly diminished right base Extremities-No LE edema Wound-Clean, dry, well healed  Diagnostic Tests: ***  Impression and Plan: We discussed endocarditis prophylaxis.  We will discuss driving and participation in cardiac rehab at the next office visit. He has an appointment to see cardiology on 10/24 and infectious disease on 10/29, and an echocardiogram on November 1st. He will return to see Dr. Leafy Ro in 2 weeks.     Ardelle Balls, PA-C [facility] [phone]

## 2023-04-26 NOTE — Progress Notes (Signed)
Patient ID: [place], male   DOB: [birth year], 57 y.o.   MRN: [id number]  This SW covering for primary SW, Lavera Guise.   SW received FMLA forms for pt dtr. SW informed nursing that forms will be provided to PA-Pam, and SW will follow-up once completed.   Cecile Sheerer, MSW, LCSWA Office: [phone] Cell: [phone] Fax: [phone]

## 2023-04-26 NOTE — Progress Notes (Signed)
Patient ID: [place], male   DOB: 07-25-1965, 57 y.o.   MRN: [id number]  Pam with [employer] to meet with pt and daughter around 4 PM.

## 2023-04-26 NOTE — Discharge Summary (Cosign Needed Addendum)
Physician Discharge Summary  Patient ID: Dale Mills MRN: [id number] DOB/AGE: 1965-09-20 57 y.o.  Admit date: 04/09/2023 Discharge date: 04/26/2023  Discharge Diagnoses:  Principal Problem:   Debility Active Problems:   Endocarditis of mitral valve   Acute ischemic left posterior cerebral artery (PCA) stroke (HCC)   Pericardial effusion after operative procedure   Shortness of breath   Bilateral lower extremity edema Acute blood loss anemia Atrial Fibrillation  Discharged Condition: stable  Significant Diagnostic Studies:   Labs:  Basic Metabolic Panel:    Latest Ref Rng & Units 04/24/2023    6:39 PM 04/20/2023    3:56 AM 04/17/2023    4:00 AM  BMP  Glucose 70 - 99 mg/dL 295  95  97   BUN 6 - 20 mg/dL 19  14  10    Creatinine 0.61 - 1.24 mg/dL 2.84  1.32  4.40   Sodium 135 - 145 mmol/L 136  137  137   Potassium 3.5 - 5.1 mmol/L 3.6  3.7  4.0   Chloride 98 - 111 mmol/L 101  100  103   CO2 22 - 32 mmol/L 26  28  25    Calcium 8.9 - 10.3 mg/dL 9.3  9.3  8.9      CBC:    Latest Ref Rng & Units 04/24/2023    6:39 PM 04/20/2023    3:56 AM 04/17/2023    4:00 AM  CBC  WBC 4.0 - 10.5 K/uL 8.5  6.9  7.5   Hemoglobin 13.0 - 17.0 g/dL 10.2  9.0  8.4   Hematocrit 39.0 - 52.0 % 33.2  29.2  27.9   Platelets 150 - 400 K/uL 353  341  391      CBG: Recent Labs  Lab 04/25/23 1200 04/25/23 1623 04/25/23 2057 04/26/23 0550 04/26/23 1120  GLUCAP 86 115* 147* 80 115*    Protime: Lab Results  Component Value Date   INR 2.3 (H) 04/25/2023   INR 2.3 (H) 04/24/2023   INR 2.3 (H) 04/24/2023    Brief HPI:   Dale Mills is a 57 y.o. male with history of HTN, nonobstructive CAD, THR 4/24, recurrent UTI with pyelonephritis in the past who was admitted via Rh on 03/28/2023 with nausea vomiting and malaise due to E faecalis bacteremia, acute infective mitral valve endocarditis, pulmonary edema and septic shock requiring intubation.  He was transferred to [facility]  for management and was started on IV antibiotics as well as pressors.  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  She CTS was consulted for surgical intervention and surgical intervention recommended when patient stable.    Hospital course was significant bouts of agitation with lethargy and MRI/MRI brain revealed right-PCA occipital infarct with acute small infarcts in bilateral parietal lobe and cerebellum question due to septic emboli. He was taken to the OR for MVR, AVR and ascending aortic root replacement by Dr. Leafy Ro on 09/20.  He tolerated extubation without difficulty and postop A-fib was treated with amiodarone.  MRI of right hip done for workup and was negative for septic arthritis and edema left gluteus and proximal quad musculature felt to be infectious versus inflammatory.  Dr. Ilsa Iha recommended 6-week course of IV ampicillin for ceftriaxone with EOT 11/01 and follow-up on outpatient basis for likely extension with oral antibiotics.  Coumadin was added on 09/25 due to persistent A-fib.  LUE dopplers done due to edema and was positive for left internal jugular DVT as well as supervision left cephalic DVT.  Therapy was working with patient who was noted to have cognitive deficits with left inattention, delay in processing, weakness as well as balance deficits.  CIR was recommended due to functional decline.   Hospital Course: Dale Mills was admitted to rehab 04/09/2023 for inpatient therapies to consist of PT, ST and OT at least three hours five days a week. Past admission physiatrist, therapy team and rehab RN have worked together to provide customized collaborative inpatient rehab. Mentation has improved and bouts of confusion has resolved. His blood pressures were monitored on TID basis and have been stable. Mild fluid overload with treated with IV  diuresis and repeat 2 D echo showed EF 60-65% with large pericardial effusion, trivial MVR  with [employer] in mitral position and well seated aortic valve.  Dr. 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 His respiratory status has improved and has been stable. No cardiac symptoms reported with increase in activity.   His blood sugars were monitored with ac/hs CBG checks and SSI was use prn for tighter BS control. Insulin glargine was used initially for BS control.  With increase in activity blood sugars are well-controlled and insulin was discontinued.  His p.o. intake has been good and is continent of bowel and bladder. He is tolerating antibiotics and without side effects and has been afebrile during his stay. Follow up CBC showed  ABLA is resolving and WBC is WNL. Serial BMET showed lytes and renal status to be WNL. Pharmacy has been helping manage and dose coumadin. INR is therapeutic on 4 mg per day and protime to be repeated on 10/22 at coumadin clinic in [place]. 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  He will continue to receive follow-up home health PT, OT, ST and RN by [facility].   Rehab course: During patient's stay in rehab weekly team conferences were held to monitor patient's progress, set goals and discuss barriers to discharge. At admission, patient required standby to max assist with basic ADL task and mod assist with mobility. He presented with mild to moderate flaccid dysarthria with mild impairments in attention and memory and severe impairments in high-level cognitive tasks. He has had improvement in activity tolerance, balance, postural control as well as ability to compensate for deficits. He has had improvement in  functional use LUE  and LLE as well as improvement in awareness.  He requires supervision with ADL asks and with mobility. He requires min assist for problem solving tasks and requires supervision for recall and to utilize memory strategies.  Speech is 95% intelligible at conversational level.  Family education was completed with daughter.  Disposition: Home  Diet:  Heart healthy  Special Instructions: Continue sternal precautions. No driving or strenuous activity. 2.  Protime to be rechecked on 10/22 at 2:30 pm. Discharge Instructions     [facility] pharmacist to adjust dose for Vancomycin, Aminoglycosides and other anti-infective therapies as requested by physician.   Complete by: As directed    [facility] to provide Cath Flo 2mg    Complete by: As directed    Administer for PICC line occlusion and as ordered by physician  for other access device issues.   Anaphylaxis Kit: Provided to treat any anaphylactic reaction to the medication being provided to the patient if First Dose or when requested by physician   Complete by: As directed    Epinephrine 1mg /ml vial / amp: Administer 0.3mg  (0.25ml) subcutaneously once for moderate to severe anaphylaxis, nurse to call physician and pharmacy when reaction occurs and call 911 if needed for immediate care   Diphenhydramine 50mg /ml IV vial: Administer 25-50mg  IV/IM PRN for first dose reaction, rash, itching, mild reaction, nurse to call physician and pharmacy when reaction occurs   Sodium Chloride 0.9% NS IV: Administer if needed for hypovolemic blood pressure drop or as ordered by physician after call to physician with anaphylactic reaction   Change dressing on IV access line weekly and PRN   Complete by: As directed    Flush IV access with Sodium Chloride 0.9% and Heparin 10 units/ml or 100 units/ml   Complete by: As directed    Home infusion instructions - [facility]   Complete by: As directed     Instructions: Flush IV access with Sodium Chloride 0.9% and Heparin 10units/ml or 100units/ml   Change dressing on IV access line: Weekly and PRN   Instructions Cath Flo 2mg : Administer for PICC Line occlusion and as ordered by physician for other access device   [facility] pharmacist to adjust dose for: Vancomycin, Aminoglycosides and other anti-infective therapies as requested by physician   Method of administration may be changed at the discretion of home infusion pharmacist based upon assessment of the patient and/or caregiver's ability to self-administer the medication ordered   Complete by: As directed       Allergies as of 04/26/2023   No Known Allergies      Medication List     STOP taking these medications    enoxaparin 120 MG/0.8ML injection Commonly known as: LOVENOX   feeding supplement Liqd   furosemide 40 MG tablet Commonly known as: LASIX   nitroGLYCERIN 0.4 MG SL tablet Commonly known as: NITROSTAT   sodium chloride flush 0.9 % Soln Commonly known as: NS   traMADol 50 MG tablet Commonly known as: ULTRAM       TAKE these medications    amiodarone 200 MG tablet Commonly known as: PACERONE Take 1 tablet (200 mg total) by mouth daily. What changed:  medication strength how much to take when to take this additional instructions   ampicillin IVPB Inject 12 g into the vein daily. As a continuous infusion Indication:  Enterococcus faecalis endocarditis  First Dose: Yes Last Day of Therapy: 05/12/2023 Labs - Once weekly:  CBC/D and BMP, Labs - Once weekly: ESR and CRP Method of administration: Ambulatory Pump (Continuous Infusion) Method of administration may be changed at the discretion of home infusion pharmacist based upon assessment of the patient and/or caregiver's ability to self-administer the medication ordered.   ascorbic acid 500 MG tablet Commonly known as: VITAMIN C Take 1 tablet (500 mg total) by mouth daily.   aspirin EC  81 MG tablet Take 1 tablet (81 mg total) by mouth daily. Swallow whole.   atorvastatin 80 MG tablet Commonly known as: LIPITOR Take 1 tablet (80 mg total) by mouth daily.   cefTRIAXone IVPB Commonly known as: ROCEPHIN Inject 2 g into the vein every 12 (twelve) hours. Indication:   Enterococcus faecalis endocarditis  First Dose: Yes Last Day of Therapy:  05/12/2023 Labs - Once weekly:  CBC/D and BMP, Labs -  Once weekly: ESR and CRP Method of administration: IV Push Method of administration may be changed at the discretion of home infusion pharmacist based upon assessment of the patient and/or caregiver's ability to self-administer the medication ordered.   ezetimibe 10 MG tablet Commonly known as: ZETIA Take 1 tablet (10 mg total) by mouth daily.   metoprolol tartrate 25 MG tablet Commonly known as: LOPRESSOR Take 0.5 tablets (12.5 mg total) by mouth 2 (two) times daily.   mouth rinse Liqd solution 15 mLs by Mouth Rinse route 2 (two) times daily.   pantoprazole 40 MG tablet Commonly known as: PROTONIX Take 1 tablet (40 mg total) by mouth daily. What changed: Another medication with the same name was removed. Continue taking this medication, and follow the directions you see here.   potassium chloride 10 MEQ tablet Commonly known as: KLOR-CON M Take 1 tablet (10 mEq total) by mouth daily.   tamsulosin 0.4 MG Caps capsule Commonly known as: FLOMAX Take 1 capsule (0.4 mg total) by mouth daily after supper. What changed: when to take this   torsemide 20 MG tablet Commonly known as: DEMADEX Take 1 tablet (20 mg total) by mouth daily.   traZODone 50 MG tablet Commonly known as: DESYREL Take 1 tablet (50 mg total) by mouth at bedtime.   Trigels-F Forte Caps capsule Generic drug: Fe Fum-Vit C-Vit B12-FA Take 1 capsule by mouth daily after breakfast.   warfarin 4 MG tablet Commonly known as: COUMADIN Take 1 tablet (4 mg total) by mouth daily at 4 PM. What changed:   medication strength how much to take when to take this additional instructions               Discharge Care Instructions  (From admission, onward)           Start     Ordered   04/24/23 0000  Change dressing on IV access line weekly and PRN  (Home infusion instructions - [facility] )        04/24/23 1319            Follow-up Information     Nonnie Done., MD Follow up.   Specialty: Family Medicine Why: Call in 1-2 days for post hospital follow up Contact information: [address] [place] [address] [phone]         Erick Colace, MD. Call.   Specialty: Physical Medicine and Rehabilitation Why: As needed Contact information: [address] [place] [address] [phone]         Eugenio Hoes, MD Follow up.   Specialty: Cardiothoracic Surgery Contact information: [address] [place] [address] [phone]         Georgeanna Lea, MD Follow up on 05/02/2023.   Specialty: Cardiology Why: be there at 2:30 pm/Coumadin clinice Contact information: [address] [place] [address] [phone]                 Signed: Jacquelynn Cree 04/26/2023, 4:30 PM

## 2023-04-28 ENCOUNTER — Other Ambulatory Visit: Payer: Self-pay | Admitting: Thoracic Surgery (Cardiothoracic Vascular Surgery)

## 2023-04-28 DIAGNOSIS — Z9889 Other specified postprocedural states: Secondary | ICD-10-CM

## 2023-05-01 ENCOUNTER — Ambulatory Visit (INDEPENDENT_AMBULATORY_CARE_PROVIDER_SITE_OTHER): Payer: Self-pay | Admitting: Physician Assistant

## 2023-05-01 ENCOUNTER — Ambulatory Visit
Admission: RE | Admit: 2023-05-01 | Discharge: 2023-05-01 | Disposition: A | Discharge status: Home / Self Care | Payer: Medicaid Other | Source: Ambulatory Visit | Attending: Thoracic Surgery (Cardiothoracic Vascular Surgery) | Admitting: Thoracic Surgery (Cardiothoracic Vascular Surgery)

## 2023-05-01 VITALS — BP 115/78 | HR 96 | Resp 18 | Ht 72.0 in | Wt 233.0 lb

## 2023-05-01 DIAGNOSIS — Z952 Presence of prosthetic heart valve: Secondary | ICD-10-CM

## 2023-05-01 LAB — FUNGUS CULTURE RESULT

## 2023-05-01 LAB — FUNGUS CULTURE WITH STAIN

## 2023-05-01 LAB — FUNGAL ORGANISM REFLEX

## 2023-05-01 NOTE — Patient Instructions (Signed)

## 2023-05-02 ENCOUNTER — Ambulatory Visit: Payer: Medicaid Other | Attending: Cardiology

## 2023-05-02 ENCOUNTER — Other Ambulatory Visit (HOSPITAL_COMMUNITY): Payer: Self-pay

## 2023-05-02 ENCOUNTER — Ambulatory Visit: Payer: Medicaid Other

## 2023-05-02 DIAGNOSIS — I82409 Acute embolism and thrombosis of unspecified deep veins of unspecified lower extremity: Secondary | ICD-10-CM | POA: Insufficient documentation

## 2023-05-02 DIAGNOSIS — Z7901 Long term (current) use of anticoagulants: Secondary | ICD-10-CM

## 2023-05-02 DIAGNOSIS — Z952 Presence of prosthetic heart valve: Secondary | ICD-10-CM

## 2023-05-02 DIAGNOSIS — I4891 Unspecified atrial fibrillation: Secondary | ICD-10-CM

## 2023-05-02 HISTORY — DX: Unspecified atrial fibrillation: I48.91

## 2023-05-02 HISTORY — DX: Presence of prosthetic heart valve: Z95.2

## 2023-05-02 HISTORY — DX: Long term (current) use of anticoagulants: Z79.01

## 2023-05-02 HISTORY — DX: Acute embolism and thrombosis of unspecified deep veins of unspecified lower extremity: I82.409

## 2023-05-02 LAB — POCT INR: INR: 3.2 — AB (ref 2.0–3.0)

## 2023-05-02 MED ORDER — TORSEMIDE 20 MG PO TABS: ORAL_TABLET | ORAL | 0 refills | Status: DC

## 2023-05-02 MED ORDER — POTASSIUM CHLORIDE CRYS ER 10 MEQ PO TBCR: EXTENDED_RELEASE_TABLET | ORAL | 0 refills | Status: DC

## 2023-05-02 NOTE — Patient Instructions (Addendum)
Description   Only take 1/2 tablet today and then resume taking 1 tablet daily.  Remain consistent with greens each week  Coumadin Clinic [phone]    A full discussion of the nature of anticoagulants has been carried out.  A benefit risk analysis has been presented to the patient, so that they understand the justification for choosing anticoagulation at this time. The need for frequent and regular monitoring, precise dosage adjustment and compliance is stressed.  Side effects of potential bleeding are discussed.  The patient should avoid any OTC items containing aspirin or ibuprofen, and should avoid great swings in general diet.  Avoid alcohol consumption.  Call if any signs of abnormal bleeding.

## 2023-05-03 NOTE — Progress Notes (Unsigned)
Cardiology Office Note:  .   Date:  05/04/2023  ID:  Dale Mills, DOB 02/18/1966, MRN [id number] PCP: Nonnie Done., MD  [facility] Providers Cardiologist:  Gypsy Balsam, MD    History of Present Illness: .   Dale Mills is a 57 y.o. 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  04/14/2023 echo EF 60 to 65%, severe concentric LVH, LA mildly dilated, RA moderately dilated, trivial MR without stenosis 33 mm Medtronic present in the mitral position, RV did not relax completely suggesting intrapericardial pressure--large pericardial effusion present without evidence of tamponade, well-seated bioprosthetic aortic valve without stenosis 27 mm Edwards valve present in the aortic position 03/31/2019 for AVR and MVR 05/03/2020 coronary CTA calcium score 15, 60th percentile, mild atherosclerosis of the distal LAD  He was admitted on 03/28/2023 to [facility] after initially [name] with complaints of nausea, weakness, fever, chills.  Workup in the ED revealed he was hypotensive, leukocytosis, AKI with creatinine of 2.6 and tachycardic.  TTE was obtained concerning for mitral valve vegetation he was then transferred to [facility].  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  He underwent AVR and aortic root replacement.  Suffered acute ischemic infarct, felt to be cardioembolic.  Postop day 7 his left arm was noted to be swollen, venous duplex was arranged revealing left internal jugular DVT and left superficial cephalic vein thrombosis he ultimately transferred to inpatient rehab on 04/07/2023.  He was evaluated by [facility] on 05/01/2023, chest x-ray  was concerning for possible pericardial effusion, Dr. Milinda Antis felt it was a left pleural effusion and patient was instructed to increase his torsemide 40 mg daily along with potassium supplementation for 5 days.  He presents today accompanied by his daughter for follow-up after his extensive hospitalization as outlined above.  He states he is feeling okay, slowly noticing improvements.  He is still receiving IV antibiotics per ID.  He is receiving home health services.  He is currently needing to stay with his daughter as his stroke has left him with visual deficits.  Since his diuretic was increased a few days ago, his weight is down 4 pounds according to our scales, he states he can breathe somewhat better. He denies chest pain, palpitations, dyspnea, pnd, orthopnea, n, v, dizziness, syncope, edema, weight gain, or early satiety.   ROS: Review of Systems  Constitutional:  Positive for malaise/fatigue.  HENT: Negative.    Eyes:        Vision loss following stroke  Respiratory: Negative.    Cardiovascular: Negative.   Gastrointestinal: Negative.   Genitourinary: Negative.   Musculoskeletal: Negative.   Skin: Negative.   Neurological: Negative.   Endo/Heme/Allergies:  Bruises/bleeds easily.  Psychiatric/Behavioral: Negative.      Studies Reviewed: Marland Kitchen   EKG Interpretation Date/Time:  Thursday May 04 2023 11:33:54 EDT Ventricular Rate:  73 PR Interval:  194 QRS Duration:  112 QT Interval:  434 QTC Calculation: 478 R Axis:   -44  Text Interpretation: Normal sinus rhythm Left axis deviation Incomplete left bundle branch block Left ventricular hypertrophy with repolarization abnormality Abnormal ECG When compared with ECG of 01-Apr-2023 07:06, Nonspecific T wave abnormality now evident in Inferior leads Confirmed by Wallis Bamberg [phone]) on 05/04/2023 4:40:47  PM    Cardiac Studies & Procedures       ECHOCARDIOGRAM  ECHOCARDIOGRAM COMPLETE 04/14/2023  Narrative ECHOCARDIOGRAM  REPORT    Patient Name:   Dale Mills Date of Exam: 04/14/2023 Medical Rec #:  [id number]      Height:       72.0 in Accession #:    [id number]     Weight:       242.7 lb Date of Birth:  Apr 27, 1966      BSA:          2.313 m Patient Age:    57 years       BP:           112/70 mmHg Patient Gender: M              HR:           80 bpm. Exam Location:  Inpatient  Procedure: 2D Echo, Color Doppler and Cardiac Doppler  Indications:    Dyspnea  History:        Patient has prior history of Echocardiogram examinations, most recent 03/13/2023. CAD, AVR and root replacement (Edwards Konect Resilia 27mm) and MVR (Medtronic 33mm) 03/31/23, Signs/Symptoms:Bacteremia; Risk Factors:Dyslipidemia and Hypertension. Aortic Valve: 27 mm Edwards valve is present in the aortic position. Procedure Date: 03/31/2023. Mitral Valve: 33 mm Medtronic valve is present in the mitral position. Procedure Date: 03/31/2023.  Sonographer:    Milbert Coulter Referring Phys: Jodelle Red  IMPRESSIONS   1. Left ventricular ejection fraction, by estimation, is 60 to 65%. The left ventricle has normal function. The left ventricle has no regional wall motion abnormalities. There is severe concentric left ventricular hypertrophy. Left ventricular diastolic parameters are indeterminate. 2. Right ventricular systolic function is normal. The right ventricular size is normal. 3. Left atrial size was mildly dilated. 4. Right atrial size was moderately dilated. 5. Trivial mitral valve regurgitation. No evidence of mitral stenosis. There is a 33 mm Medtronic present in the mitral position. Procedure Date: 03/31/2023. 6. Right ventricle does not relax completely suggesting intrapercardial pressure. Large pericardial effusion. The pericardial effusion is circumferential. There is no evidence of cardiac tamponade. 7. Well-seated bioprosthetic aortic valve. Aortic valve regurgitation is not visualized. No aortic stenosis is  present. There is a 27 mm Edwards valve present in the aortic position. Procedure Date: 03/31/2023. Aortic valve mean gradient measures 10.0 mmHg. Aortic valve Vmax measures 2.21 m/s. 8. The inferior vena cava is normal in size with greater than 50% respiratory variability, suggesting right atrial pressure of 3 mmHg.  FINDINGS Left Ventricle: Left ventricular ejection fraction, by estimation, is 60 to 65%. The left ventricle has normal function. The left ventricle has no regional wall motion abnormalities. The left ventricular internal cavity size was normal in size. There is severe concentric left ventricular hypertrophy. Left ventricular diastolic parameters are indeterminate.  Right Ventricle: The right ventricular size is normal. No increase in right ventricular wall thickness. Right ventricular systolic function is normal.  Left Atrium: Left atrial size was mildly dilated.  Right Atrium: Right atrial size was moderately dilated.  Pericardium: Right ventricle does not relax completely suggesting intrapercardial pressure. A large pericardial effusion is present. The pericardial effusion is circumferential. There is no evidence of cardiac tamponade.  Mitral Valve: Well-seated bioprosthetic mitral valve. Trivial mitral valve regurgitation. There is a 33 mm Medtronic present in the mitral position. Procedure Date: 03/31/2023. No evidence of mitral valve stenosis. MV peak gradient, 14.4 mmHg. The mean mitral valve gradient is  7.5 mmHg.  Tricuspid Valve: The tricuspid valve is normal in structure. Tricuspid valve regurgitation is not demonstrated. No evidence of tricuspid stenosis.  Aortic Valve: Well-seated bioprosthetic aortic valve. Aortic valve regurgitation is not visualized. No aortic stenosis is present. Aortic valve mean gradient measures 10.0 mmHg. Aortic valve peak gradient measures 19.5 mmHg. Aortic valve area, by VTI measures 3.67 cm. There is a 27 mm Edwards valve present in the  aortic position. Procedure Date: 03/31/2023.  Pulmonic Valve: The pulmonic valve was normal in structure. Pulmonic valve regurgitation is not visualized. No evidence of pulmonic stenosis.  Aorta: The aortic root is normal in size and structure.  Venous: The inferior vena cava is normal in size with greater than 50% respiratory variability, suggesting right atrial pressure of 3 mmHg.  IAS/Shunts: No atrial level shunt detected by color flow Doppler.   LEFT VENTRICLE PLAX 2D LVIDd:         4.60 cm   Diastology LVIDs:         2.90 cm   LV e' medial:    7.72 cm/s LV PW:         1.70 cm   LV E/e' medial:  17.6 LV IVS:        1.60 cm   LV e' lateral:   6.74 cm/s LVOT diam:     2.30 cm   LV E/e' lateral: 20.2 LV SV:         125 LV SV Index:   54 LVOT Area:     4.15 cm   RIGHT VENTRICLE RV Basal diam:  3.70 cm RV Mid diam:    2.80 cm RV S prime:     10.40 cm/s TAPSE (M-mode): 1.6 cm  LEFT ATRIUM              Index        RIGHT ATRIUM           Index LA diam:        4.00 cm  1.73 cm/m   RA Area:     22.70 cm LA Vol (A2C):   118.0 ml 51.01 ml/m  RA Volume:   70.60 ml  30.52 ml/m LA Vol (A4C):   57.9 ml  25.03 ml/m LA Biplane Vol: 86.7 ml  37.48 ml/m AORTIC VALVE AV Area (Vmax):    3.38 cm AV Area (Vmean):   3.39 cm AV Area (VTI):     3.67 cm AV Vmax:           221.00 cm/s AV Vmean:          147.000 cm/s AV VTI:            0.340 m AV Peak Grad:      19.5 mmHg AV Mean Grad:      10.0 mmHg LVOT Vmax:         180.00 cm/s LVOT Vmean:        120.000 cm/s LVOT VTI:          0.300 m LVOT/AV VTI ratio: 0.88  AORTA Ao Root diam: 3.50 cm Ao Asc diam:  3.20 cm  MITRAL VALVE MV Area (PHT): 3.60 cm     SHUNTS MV Area VTI:   2.14 cm     Systemic VTI:  0.30 m MV Peak grad:  14.4 mmHg    Systemic Diam: 2.30 cm MV Mean grad:  7.5 mmHg MV Vmax:       1.90 m/s MV Vmean:      133.0  cm/s MV Decel Time: 211 msec MV E velocity: 136.00 cm/s MV A velocity: 128.00 cm/s MV E/A  ratio:  1.06  Kardie Tobb DO Electronically signed by Thomasene Ripple DO Signature Date/Time: 04/14/2023/4:45:43 PM    Final   TEE  ECHO INTRAOPERATIVE TEE 03/31/2023  Narrative *INTRAOPERATIVE TRANSESOPHAGEAL REPORT *    Patient Name:   CHERIE KHERA Gubser Date of Exam: 03/31/2023 Medical Rec #:  [id number]      Height:       71.0 in Accession #:    [id number]     Weight:       247.1 lb Date of Birth:  Jan 08, 1966      BSA:          2.31 m Patient Age:    57 years       BP:           131/56 mmHg Patient Gender: M              HR:           74 bpm. Exam Location:  Anesthesiology  Transesophogeal exam was perform intraoperatively during surgical procedure. Patient was closely monitored under general anesthesia during the entirety of examination.  Indications:     I34.8 Other nonrheumatic mitral valve disorders Performing Phys: [id number] Eugenio Hoes Diagnosing Phys: Val Eagle MD  Complications: No known complications during this procedure. POST-OP IMPRESSIONS _ Left Ventricle: has mildly reduced systolic function, with an ejection fraction of 55%. The wall motion is septal dyssynchrony in setting of pacing. _ Right Ventricle: mildly reduced function. The cavity was mildly dilated. _ Aorta: there is no dissection present in the aorta. _ Aortic Valve: A bioprosthetic bioprosthetic valve was placed, leaflets are freely mobile and leaflets thin. trivial centrally. No perivalvular leak noted.AV gradient post replacement is 3 mmHg. _ Mitral Valve: A bioprosthetic bioprosthetic valve was placed, leaflets are not freely mobile and leaflets thin.MV post gradient 2 mmHg. Multipl tiny regurgitant jets presenton COlor doppler Appear to arise within sewing ring and improved with protamine. Valve well seated. _ Tricuspid Valve: There is trivial regurgitation.  PRE-OP FINDINGS Left Ventricle: The left ventricle has normal systolic function, with an ejection fraction of 55-60%. The cavity  size was normal.   Right Ventricle: The right ventricle has normal systolic function. The cavity was normal. There is no increase in right ventricular wall thickness.  Left Atrium: Left atrial size was not assessed. No left atrial/left atrial appendage thrombus was detected. The left atrial appendage is well visualized and there is no evidence of thrombus present. Left atrial appendage velocity is normal at greater than 40 cm/s.  Right Atrium: Right atrial size was not assessed.  Interatrial Septum: No atrial level shunt detected by color flow Doppler. There is no evidence of a patent foramen ovale.  Pericardium: A small pericardial effusion is present. The pericardial effusion is anterior to the right ventricle. There is a moderate pleural effusion in the left lateral region.  Mitral Valve: The mitral valve is dilated. Mitral valve regurgitation is severe by color flow Doppler. There is No evidence of mitral stenosis. Mobile echodensity orginating from atrial side of anterior mitral leaflet representing vegetation. Anterior mitral leaflet with appears to have a perforation in it associated with vegetation and is without prolapse or flail.  Tricuspid Valve: The tricuspid valve unable to evaluate. Cannot rule out vegetation. Tricuspid valve regurgitation appears to be moderate but difficult to assess given poor imaging.  Aortic Valve: The aortic valve is  tricuspid Aortic valve regurgitation is severe by color flow Doppler. There is no stenosis of the aortic valve, with a calculated valve area of 3.28 cm. Mobile echodensity in LVOT connected to non or left cusp representing vegetation. Severe AI orginating centrally and between left/non commisure.  Pulmonic Valve: The pulmonic valve was normal in structure, with normal. Pulmonic valve regurgitation is trivial by color flow Doppler.   Aorta: There is moderate dilatation of the aortic root, measuring 48 mm.  +--------------+--------++ LEFT  VENTRICLE         +--------------+--------++ PLAX 2D                +--------------+--------++ LVOT diam:    2.40 cm  +--------------+--------++ LVOT Area:    4.52 cm +--------------+--------++                        +--------------+--------++  +------------------+-----------++ AORTIC VALVE                  +------------------+-----------++ AV Area (Vmax):   3.00 cm    +------------------+-----------++ AV Area (Vmean):  3.11 cm    +------------------+-----------++ AV Area (VTI):    3.28 cm    +------------------+-----------++ AV Vmax:          133.50 cm/s +------------------+-----------++ AV Vmean:         90.350 cm/s +------------------+-----------++ AV VTI:           0.232 m     +------------------+-----------++ AV Peak Grad:     7.1 mmHg    +------------------+-----------++ AV Mean Grad:     4.0 mmHg    +------------------+-----------++ LVOT Vmax:        88.40 cm/s  +------------------+-----------++ LVOT Vmean:       62.100 cm/s +------------------+-----------++ LVOT VTI:         0.168 m     +------------------+-----------++ LVOT/AV VTI ratio:0.72        +------------------+-----------++ AR PHT:           257 msec    +------------------+-----------++  +-------------+---------++       +---------------+-----------++ MITRAL VALVE                 TRICUSPID VALVE            +-------------+---------++       +---------------+-----------++ MV Peak grad:8.0 mmHg        TR Peak grad:  63.0 mmHg   +-------------+---------++       +---------------+-----------++ MV Mean grad:2.3 mmHg        TR Vmax:       397.00 cm/s +-------------+---------++       +---------------+-----------++ MV Vmax:     1.41 m/s  +-------------+---------++       +--------------+-------+ MV Vmean:    67.3 cm/s       SHUNTS                +-------------+---------++        +--------------+-------+ MV VTI:      0.29 m          Systemic VTI: 0.17 m  +-------------+---------++       +--------------+-------+ +----------------+-----------++  Systemic Diam:2.40 cm MR Peak grad:   83.5 mmHg    +--------------+-------+ +----------------+-----------++ MR Mean grad:   45.0 mmHg   +----------------+-----------++ MR Vmax:        457.00 cm/s +----------------+-----------++ MR Vmean:       307.0 cm/s  +----------------+-----------++ MR PISA:  18.16 cm   +----------------+-----------++ MR PISA Eff ROA:153 mm     +----------------+-----------++ MR PISA Radius: 1.70 cm     +----------------+-----------++   Val Eagle MD Electronically signed by Val Eagle MD Signature Date/Time: 04/09/2023/11:09:24 AM    Final    CT SCANS  CT CORONARY MORPH W/CTA COR W/SCORE 05/01/2020  Addendum 05/03/2020 10:50 PM ADDENDUM REPORT: 05/03/2020 22:47  EXAM: Cardiac/Coronary  CT  TECHNIQUE: The patient was scanned on a [employer].  FINDINGS: A 120 kV prospective scan was triggered in the descending thoracic aorta at 111 HU's. Axial non-contrast 3 mm slices were carried out through the heart. The data set was analyzed on a dedicated work station and scored using the Agatson method. Gantry rotation speed was 250 msecs and collimation was .6 mm. No beta blockade and 0.8 mg of sl NTG was given. The 3D data set was reconstructed in 5% intervals of the 67-82 % of the R-R cycle. Diastolic phases were analyzed on a dedicated work station using MPR, MIP and VRT modes. The patient received 80 cc of contrast.  Aorta: Normal size. Scattered calcifications in the descending aorta. No dissection.  Aortic Valve:  Trileaflet.  No calcifications.  Coronary Arteries:  Normal coronary origin.  Right dominance.  RCA is a large dominant artery that gives rise to PDA and PLVB. There is no plaque.  Left main is a  short artery that gives rise to LAD and LCX arteries. There is no plaque.  LAD is a large vessel that gives rise to a moderate sized D1 and large branching D2. There is mild calcified plaque in the mid LAD after the takeoff of the second diagonal with associated stenosis of 25-49%. Study is sub optimal for evaluation of non calcified plaque in the D1 and D2 or distal LAD due to significant noise artifact.  LCX is a non-dominant artery that gives rise to one large OM1 and OM2 branches. There is no plaque in the proximal and mid LCx. Cannot assess the OM1 and OM2 branches or distal LCx for non calcified plaque due to significant noise artifact due to obesity. There is no calcified plaque.  Other findings:  Normal pulmonary vein drainage into the left atrium.  Normal let atrial appendage without a thrombus.  Normal size of the pulmonary artery.  IMPRESSION: 1. Coronary calcium score of 15. This was 60th percentile for age and sex matched control.  2.  Normal coronary origin with right dominance.  3.  Mild Atherosclerosis of the distal LAD.  CAD RADS 2.  4.  Consider non atherosclerotic causes of chest pain.  5.  Recommend preventive therapy and risk factor modification.  6. If clinical suspicion high, consider nuclear stress imaging given sub optimal study for assessment of non calcified plaque in the diagonal and OM branches as well as distal LAD and LCx.  Armanda Magic   Electronically Signed By: Armanda Magic On: 05/03/2020 22:47  Narrative EXAM: OVER-READ INTERPRETATION  CT CHEST  The following report is an over-read performed by radiologist Dr. Trudie Reed of [facility] on 05/01/2020. This over-read does not include interpretation of cardiac or coronary anatomy or pathology. The coronary calcium score/coronary CTA interpretation by the cardiologist is attached.  COMPARISON:  None.  FINDINGS: Aortic atherosclerosis. Within the visualized  portions of the thorax there are no suspicious appearing pulmonary nodules or masses, there is no acute consolidative airspace disease, no pleural effusions, no pneumothorax and no lymphadenopathy. Visualized portions of the upper  abdomen are unremarkable. There are no aggressive appearing lytic or blastic lesions noted in the visualized portions of the skeleton.  IMPRESSION: 1.  Aortic Atherosclerosis (ICD10-I70.0).  Electronically Signed: By: Trudie Reed M.D. On: 05/01/2020 09:43          Risk Assessment/Calculations:    CHA2DS2-VASc Score = 4   This indicates a 4.8% annual risk of stroke. The patient's score is based upon: CHF History: 0 HTN History: 1 Diabetes History: 0 Stroke History: 2 Vascular Disease History: 1 Age Score: 0 Gender Score: 0            Physical Exam:   VS:  BP 115/79 (BP Location: Left Arm, Patient Position: Sitting, Cuff Size: Normal)   Pulse 73   Ht 6' (1.829 m)   Wt 229 lb (103.9 kg)   SpO2 98%   BMI 31.06 kg/m    Wt Readings from Last 3 Encounters:  05/04/23 229 lb (103.9 kg)  05/01/23 233 lb (105.7 kg)  04/26/23 232 lb 9.4 oz (105.5 kg)    GEN: Well nourished, well developed in no acute distress NECK: No JVD; No carotid bruits CARDIAC: RRR, 1/6 systolic murmur, rubs, gallops RESPIRATORY:  Clear to auscultation without rales, wheezing or rhonchi  ABDOMEN: Soft, non-tender, non-distended EXTREMITIES:  No edema; No deformity   ASSESSMENT AND PLAN: .   S/p MVR & s/p AVR-MVR replaced with 31 mm Mosaic porcine valve and aortic root Bentall procedure with a 27 mm connect pericardial valve conduit. He was evaluated by [facility] on 05/01/2023 for his postoperative visit, chest x-ray revealed possible pericardial effusion however Dr. Leafy Ro personally reviewed and felt it was more likely a pleural effusion.  His diuretic was increased for 5 days.  He just had lab work collected yesterday and has not resulted yet.  He has an upcoming  echocardiogram on 05/12/2023.  Anticoagulated on Coumadin.  Will arrange for SBE for any dental procedures.  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  Does not have a plan to see neurology however if his vision does not improve some he will be evaluated by a neuro-ophthalmologist.  Paroxysmal atrial fibrillation/hypercoagulable state-he is maintaining sinus rhythm today, CHA2DS2-VASc score of 4--anticoagulated with Coumadin.  There is not much in his chart indicate the events regarding atrial fibrillation, if it was postoperative ATC however he was started on amiodarone and Coumadin.  Currently on amiodarone 200 mg daily--recent chest x-ray by [facility] was unrevealing for amiodarone toxicity and no SOB.  He just had blood work yesterday with [facility], has not resulted yet.  At next OV we will check TSH, LFTs.  Pleural effusion-being managed by [facility], his diuretic was increased for 5 days, lab work was checked yesterday and he will follow-up with them in the office next week.  CAD-mild, nonobstructive coronary CTA in 2021, continue atorvastatin 80 mg daily, continue Zetia 10 mg daily, continue metoprolol 12.5 mg twice daily.  Hypertension-blood pressure is well-controlled at 115/79, currently not on any antihypertensive agents.  Dyslipidemia-currently on atorvastatin 80 mg daily, Zetia 10 mg daily.       Dispo: Follow-up in 5 weeks with Dr. Bing Matter  Signed, Flossie Dibble, NP

## 2023-05-04 ENCOUNTER — Ambulatory Visit: Payer: Medicaid Other | Attending: Cardiology | Admitting: Cardiology

## 2023-05-04 ENCOUNTER — Encounter: Payer: Self-pay | Admitting: Cardiology

## 2023-05-04 VITALS — BP 115/79 | HR 73 | Ht 72.0 in | Wt 229.0 lb

## 2023-05-04 DIAGNOSIS — I10 Essential (primary) hypertension: Secondary | ICD-10-CM

## 2023-05-04 DIAGNOSIS — Z952 Presence of prosthetic heart valve: Secondary | ICD-10-CM | POA: Diagnosis not present

## 2023-05-04 DIAGNOSIS — J90 Pleural effusion, not elsewhere classified: Secondary | ICD-10-CM

## 2023-05-04 DIAGNOSIS — I4891 Unspecified atrial fibrillation: Secondary | ICD-10-CM | POA: Diagnosis not present

## 2023-05-04 DIAGNOSIS — I25118 Atherosclerotic heart disease of native coronary artery with other forms of angina pectoris: Secondary | ICD-10-CM

## 2023-05-04 DIAGNOSIS — D6859 Other primary thrombophilia: Secondary | ICD-10-CM

## 2023-05-04 DIAGNOSIS — E785 Hyperlipidemia, unspecified: Secondary | ICD-10-CM | POA: Diagnosis not present

## 2023-05-04 DIAGNOSIS — Z79899 Other long term (current) drug therapy: Secondary | ICD-10-CM

## 2023-05-04 NOTE — Patient Instructions (Signed)
Medication Instructions:  Your physician recommends that you continue on your current medications as directed. Please refer to the Current Medication list given to you today.  *If you need a refill on your cardiac medications before your next appointment, please call your pharmacy*   Lab Work: NONE If you have labs (blood work) drawn today and your tests are completely normal, you will receive your results only by: MyChart Message (if you have MyChart) OR A paper copy in the mail If you have any lab test that is abnormal or we need to change your treatment, we will call you to review the results.   Testing/Procedures: NONE   Follow-Up: At [facility], you and your health needs are our priority.  As part of our continuing mission to provide you with exceptional heart care, we have created designated Provider Care Teams.  These Care Teams include your primary Cardiologist (physician) and Advanced Practice Providers (APPs -  Physician Assistants and Nurse Practitioners) who all work together to provide you with the care you need, when you need it.  We recommend signing up for the patient portal called "MyChart".  Sign up information is provided on this After Visit Summary.  MyChart is used to connect with patients for Virtual Visits (Telemedicine).  Patients are able to view lab/test results, encounter notes, upcoming appointments, etc.  Non-urgent messages can be sent to your provider as well.   To learn more about what you can do with MyChart, go to [email or web address].    Your next appointment:   5 week(s)  Provider:   Gypsy Balsam, MD    Other Instructions

## 2023-05-05 ENCOUNTER — Other Ambulatory Visit: Payer: Self-pay | Admitting: Thoracic Surgery (Cardiothoracic Vascular Surgery)

## 2023-05-05 DIAGNOSIS — Z952 Presence of prosthetic heart valve: Secondary | ICD-10-CM

## 2023-05-07 NOTE — Progress Notes (Unsigned)
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 Pt overall doing well with ambulating using a walker. He is eating well. He has some remaining sternal discomfort but no movement of his sternum. He has had an echo and it overall looked good except for large pericardial effusion without tamponade     Past Medical History:  Diagnosis Date   Angina pectoris (HCC) Canadian classification 2 04/10/2020   Anxiety    Arthritis    Coronary artery disease coronary CT angio did not show any major issues, suspicion for distal disease 06/12/2020   Depression    DJD (degenerative joint disease)    Dyslipidemia 02/13/2020   Essential hypertension 02/13/2020   Precordial chest pain 02/13/2020    Past Surgical History:  Procedure Laterality Date   ASCENDING AORTIC ROOT REPLACEMENT N/A 03/31/2023   Procedure: ASCENDING AORTIC ROOT REPLACEMENT UTILIZING 27 KONECT RESILIA  AORTIC VALVE CONDUIT;  Surgeon: Eugenio Hoes, MD;  Location: [facility] OR;  Service: Open Heart Surgery;  Laterality: N/A;   COLONOSCOPY W/ POLYPECTOMY     FOREARM SURGERY     MASS REMOVED   HIP ARTHROSCOPY Left    LASIK Bilateral    MITRAL VALVE REPLACEMENT N/A 03/31/2023   Procedure: MITRAL VALVE (MV) REPLACEMENT UTILIZING SIZE 33 MOSAIC PORCINE HEART VALVE;  Surgeon: Eugenio Hoes, MD;  Location: [facility] OR;  Service: Open Heart Surgery;  Laterality: N/A;   TEE WITHOUT CARDIOVERSION N/A 03/31/2023   Procedure: TRANSESOPHAGEAL ECHOCARDIOGRAM;  Surgeon: Eugenio Hoes, MD;  Location: [facility] OR;  Service: Open Heart Surgery;  Laterality: N/A;   TONSILECTOMY, ADENOIDECTOMY, BILATERAL  MYRINGOTOMY AND TUBES  2011   UMBILICAL HERNIA REPAIR     VASECTOMY      Social History   Tobacco Use  Smoking Status Never  Smokeless Tobacco Never    Social History   Substance and Sexual Activity  Alcohol Use Never    Social History   Socioeconomic History   Marital status: Divorced    Spouse name: Not on file   Number of children: 2   Years of education: 12 + SOME COLLEGE   Highest education level: Not on file  Occupational History   Occupation: [employer]  Tobacco Use   Smoking status: Never   Smokeless tobacco: Never  Vaping Use   Vaping status: Never Used  Substance and Sexual Activity   Alcohol use: Never   Drug use: Never   Sexual activity: Not Currently  Other Topics Concern   Not on file  Social History Narrative   Not on file   Social Determinants of Health   Financial Resource Strain: Not on file  Food Insecurity: Patient Unable To Answer (03/28/2023)   Hunger Vital Sign    Worried About Running Out of Food in the Last Year: Patient unable to answer    Ran Out of Food in the Last Year: Patient unable to answer  Recent Concern: Food Insecurity - Food Insecurity Present (03/17/2023)   Hunger Vital Sign  Worried About Programme researcher, broadcasting/film/video in the Last Year: Sometimes true    Ran Out of Food in the Last Year: Sometimes true  Transportation Needs: Patient Unable To Answer (03/28/2023)   PRAPARE - Administrator, Civil Service (Medical): Patient unable to answer    Lack of Transportation (Non-Medical): Patient unable to answer  Physical Activity: Not on file  Stress: Not on file  Social Connections: Not on file  Intimate Partner Violence: Patient Unable To Answer (03/28/2023)   Humiliation, Afraid, Rape, and Kick questionnaire    Fear of Current or Ex-Partner: Patient unable to answer    Emotionally Abused: Patient unable to answer    Physically Abused: Patient unable to answer    Sexually Abused: Patient unable to answer    No Known  Allergies  Current Outpatient Medications  Medication Sig Dispense Refill   amiodarone (PACERONE) 200 MG tablet Take 1 tablet (200 mg total) by mouth daily. 30 tablet 0   ampicillin IVPB Inject 12 g into the vein daily. As a continuous infusion Indication:  Enterococcus faecalis endocarditis  First Dose: Yes Last Day of Therapy: 05/12/2023 Labs - Once weekly:  CBC/D and BMP, Labs - Once weekly: ESR and CRP Method of administration: Ambulatory Pump (Continuous Infusion) Method of administration may be changed at the discretion of home infusion pharmacist based upon assessment of the patient and/or caregiver's ability to self-administer the medication ordered. 16 Units 0   ascorbic acid (VITAMIN C) 500 MG tablet Take 1 tablet (500 mg total) by mouth daily. 30 tablet 0   aspirin EC 81 MG tablet Take 1 tablet (81 mg total) by mouth daily. Swallow whole. 100 tablet 0   atorvastatin (LIPITOR) 80 MG tablet Take 1 tablet (80 mg total) by mouth daily. 30 tablet 0   cefTRIAXone (ROCEPHIN) IVPB Inject 2 g into the vein every 12 (twelve) hours. Indication:   Enterococcus faecalis endocarditis  First Dose: Yes Last Day of Therapy:  05/12/2023 Labs - Once weekly:  CBC/D and BMP, Labs - Once weekly: ESR and CRP Method of administration: IV Push Method of administration may be changed at the discretion of home infusion pharmacist based upon assessment of the patient and/or caregiver's ability to self-administer the medication ordered. 32 Units 0   ezetimibe (ZETIA) 10 MG tablet Take 1 tablet (10 mg total) by mouth daily. 30 tablet 0   Fe Fum-Vit C-Vit B12-FA (TRIGELS-F FORTE) CAPS capsule Take 1 capsule by mouth daily after breakfast. 30 capsule 0   metoprolol tartrate (LOPRESSOR) 25 MG tablet Take 0.5 tablets (12.5 mg total) by mouth 2 (two) times daily. 30 tablet 0   Mouthwashes (MOUTH RINSE) LIQD solution 15 mLs by Mouth Rinse route 2 (two) times daily.     pantoprazole (PROTONIX) 40 MG tablet Take 1  tablet (40 mg total) by mouth daily. 30 tablet 0   potassium chloride (KLOR-CON M) 10 MEQ tablet Take 20 meq daily for 5 days then take 10 meq daily thereafter 30 tablet 0   tamsulosin (FLOMAX) 0.4 MG CAPS capsule Take 1 capsule (0.4 mg total) by mouth daily after supper. 30 capsule 0   torsemide (DEMADEX) 20 MG tablet Take Torsemide 40 mg daily for 5 days;then take 20 mg daily thereafter 30 tablet 0   traZODone (DESYREL) 50 MG tablet Take 1 tablet (50 mg total) by mouth at bedtime. 30 tablet 0   warfarin (COUMADIN) 4 MG tablet Take 1 tablet (4 mg total) by mouth daily at  4 PM. 30 tablet 0   No current facility-administered medications for this visit.     Family History  Problem Relation Age of Onset   CAD Mother    Kidney failure Mother    Heart attack Father    Heart attack Paternal Grandfather    Cancer Child        RARE BONE CANCER RESULTING IN TOTAL AMPUTATION OF LIMB       Physical Exam: Lungs: clear Card: rr with no murmur Sternum stable No edema     Diagnostic Studies & Laboratory data: I have personally reviewed the following studies and agree with the findings   TTE (04/2023) IMPRESSIONS     1. Left ventricular ejection fraction, by estimation, is 60 to 65%. The  left ventricle has normal function. The left ventricle has no regional  wall motion abnormalities. There is severe concentric left ventricular  hypertrophy. Left ventricular diastolic   parameters are indeterminate.   2. Right ventricular systolic function is normal. The right ventricular  size is normal.   3. Left atrial size was mildly dilated.   4. Right atrial size was moderately dilated.   5. Trivial mitral valve regurgitation. No evidence of mitral stenosis.  There is a 33 mm Medtronic present in the mitral position. Procedure Date:  03/31/2023.   6. Right ventricle does not relax completely suggesting intrapercardial  pressure. Large pericardial effusion. The pericardial effusion is   circumferential. There is no evidence of cardiac tamponade.   7. Well-seated bioprosthetic aortic valve. Aortic valve regurgitation is  not visualized. No aortic stenosis is present. There is a 27 mm Edwards  valve present in the aortic position. Procedure Date: 03/31/2023. Aortic  valve mean gradient measures 10.0  mmHg. Aortic valve Vmax measures 2.21 m/s.   8. The inferior vena cava is normal in size with greater than 50%  respiratory variability, suggesting right atrial pressure of 3 mmHg.   FINDINGS   Left Ventricle: Left ventricular ejection fraction, by estimation, is 60  to 65%. The left ventricle has normal function. The left ventricle has no  regional wall motion abnormalities. The left ventricular internal cavity  size was normal in size. There is   severe concentric left ventricular hypertrophy. Left ventricular  diastolic parameters are indeterminate.   Right Ventricle: The right ventricular size is normal. No increase in  right ventricular wall thickness. Right ventricular systolic function is  normal.   Left Atrium: Left atrial size was mildly dilated.   Right Atrium: Right atrial size was moderately dilated.   Pericardium: Right ventricle does not relax completely suggesting  intrapercardial pressure. A large pericardial effusion is present. The  pericardial effusion is circumferential. There is no evidence of cardiac  tamponade.   Mitral Valve: Well-seated bioprosthetic mitral valve. Trivial mitral valve  regurgitation. There is a 33 mm Medtronic present in the mitral position.  Procedure Date: 03/31/2023. No evidence of mitral valve stenosis. MV peak  gradient, 14.4 mmHg. The mean  mitral valve gradient is 7.5 mmHg.   Tricuspid Valve: The tricuspid valve is normal in structure. Tricuspid  valve regurgitation is not demonstrated. No evidence of tricuspid  stenosis.   Aortic Valve: Well-seated bioprosthetic aortic valve. Aortic valve  regurgitation is not  visualized. No aortic stenosis is present. Aortic  valve mean gradient measures 10.0 mmHg. Aortic valve peak gradient  measures 19.5 mmHg. Aortic valve area, by VTI  measures 3.67 cm. There is a 27 mm Edwards valve present in the aortic  position. Procedure Date: 03/31/2023.   Pulmonic Valve: The pulmonic valve was normal in structure. Pulmonic valve  regurgitation is not visualized. No evidence of pulmonic stenosis.   Aorta: The aortic root is normal in size and structure.   Venous: The inferior vena cava is normal in size with greater than 50%  respiratory variability, suggesting right atrial pressure of 3 mmHg.   IAS/Shunts: No atrial level shunt detected by color flow Doppler.     LEFT VENTRICLE  PLAX 2D  LVIDd:         4.60 cm   Diastology  LVIDs:         2.90 cm   LV e' medial:    7.72 cm/s  LV PW:         1.70 cm   LV E/e' medial:  17.6  LV IVS:        1.60 cm   LV e' lateral:   6.74 cm/s  LVOT diam:     2.30 cm   LV E/e' lateral: 20.2  LV SV:         125  LV SV Index:   54  LVOT Area:     4.15 cm     RIGHT VENTRICLE  RV Basal diam:  3.70 cm  RV Mid diam:    2.80 cm  RV S prime:     10.40 cm/s  TAPSE (M-mode): 1.6 cm   LEFT ATRIUM              Index        RIGHT ATRIUM           Index  LA diam:        4.00 cm  1.73 cm/m   RA Area:     22.70 cm  LA Vol (A2C):   118.0 ml 51.01 ml/m  RA Volume:   70.60 ml  30.52 ml/m  LA Vol (A4C):   57.9 ml  25.03 ml/m  LA Biplane Vol: 86.7 ml  37.48 ml/m   AORTIC VALVE  AV Area (Vmax):    3.38 cm  AV Area (Vmean):   3.39 cm  AV Area (VTI):     3.67 cm  AV Vmax:           221.00 cm/s  AV Vmean:          147.000 cm/s  AV VTI:            0.340 m  AV Peak Grad:      19.5 mmHg  AV Mean Grad:      10.0 mmHg  LVOT Vmax:         180.00 cm/s  LVOT Vmean:        120.000 cm/s  LVOT VTI:          0.300 m  LVOT/AV VTI ratio: 0.88    AORTA  Ao Root diam: 3.50 cm  Ao Asc diam:  3.20 cm   MITRAL VALVE  MV Area (PHT): 3.60  cm     SHUNTS  MV Area VTI:   2.14 cm     Systemic VTI:  0.30 m  MV Peak grad:  14.4 mmHg    Systemic Diam: 2.30 cm  MV Mean grad:  7.5 mmHg  MV Vmax:       1.90 m/s  MV Vmean:      133.0 cm/s  MV Decel Time: 211 msec  MV E velocity: 136.00 cm/s  MV A velocity: 128.00 cm/s  MV  E/A ratio:  1.06    Recent Radiology Findings:   CXR: no further left effusion    Recent Lab Findings: Lab Results  Component Value Date   WBC 8.5 04/24/2023   HGB 10.1 (L) 04/24/2023   HCT 33.2 (L) 04/24/2023   PLT 353 04/24/2023   GLUCOSE 148 (H) 04/24/2023   CHOL 142 03/31/2023   TRIG 189 (H) 04/01/2023   HDL <10 (L) 03/31/2023   LDLCALC NOT CALCULATED 03/31/2023   ALT 29 03/29/2023   AST 49 (H) 03/29/2023   NA 136 04/24/2023   K 3.6 04/24/2023   CL 101 04/24/2023   CREATININE 0.89 04/24/2023   BUN 19 04/24/2023   CO2 26 04/24/2023   INR 3.2 (A) 05/02/2023   HGBA1C 6.2 (H) 03/29/2023      Assessment / Plan:     SP Bental and MV replacement. Will stop amiodarone. Continue diuretics till they expire. Will continue home rehab. Pericardial effusion to have re echo on 11/1. Will follow for now. Will let us know if breathing worsens and if it does will need echo sooner.   I have spent 30 min in review of the records, viewing studies and in face to face with patient and in coordination of future care    Eugenio Hoes 05/07/2023 9:48 AM

## 2023-05-08 ENCOUNTER — Encounter: Payer: Self-pay | Admitting: Thoracic Surgery (Cardiothoracic Vascular Surgery)

## 2023-05-08 ENCOUNTER — Ambulatory Visit (INDEPENDENT_AMBULATORY_CARE_PROVIDER_SITE_OTHER): Payer: Self-pay | Admitting: Thoracic Surgery (Cardiothoracic Vascular Surgery)

## 2023-05-08 ENCOUNTER — Ambulatory Visit
Admission: RE | Admit: 2023-05-08 | Discharge: 2023-05-08 | Disposition: A | Discharge status: Home / Self Care | Payer: Medicaid Other | Source: Ambulatory Visit | Attending: Thoracic Surgery (Cardiothoracic Vascular Surgery) | Admitting: Thoracic Surgery (Cardiothoracic Vascular Surgery)

## 2023-05-08 VITALS — BP 118/81 | HR 99 | Resp 18 | Ht 72.0 in | Wt 230.0 lb

## 2023-05-08 DIAGNOSIS — Z952 Presence of prosthetic heart valve: Secondary | ICD-10-CM

## 2023-05-09 ENCOUNTER — Encounter: Payer: Self-pay | Admitting: Internal Medicine

## 2023-05-09 ENCOUNTER — Other Ambulatory Visit: Payer: Self-pay

## 2023-05-09 ENCOUNTER — Ambulatory Visit: Payer: Medicaid Other | Attending: Cardiology | Admitting: *Deleted

## 2023-05-09 ENCOUNTER — Ambulatory Visit: Payer: Medicaid Other | Admitting: Internal Medicine

## 2023-05-09 ENCOUNTER — Telehealth: Payer: Self-pay

## 2023-05-09 VITALS — BP 106/72 | HR 87 | Temp 97.5°F | Wt 232.0 lb

## 2023-05-09 DIAGNOSIS — Z5181 Encounter for therapeutic drug level monitoring: Secondary | ICD-10-CM

## 2023-05-09 DIAGNOSIS — I6349 Cerebral infarction due to embolism of other cerebral artery: Secondary | ICD-10-CM

## 2023-05-09 DIAGNOSIS — I4891 Unspecified atrial fibrillation: Secondary | ICD-10-CM

## 2023-05-09 DIAGNOSIS — Z952 Presence of prosthetic heart valve: Secondary | ICD-10-CM

## 2023-05-09 LAB — POCT INR: INR: 2.9 (ref 2.0–3.0)

## 2023-05-09 MED ORDER — AMOXICILLIN 500 MG PO CAPS: 1000.0000 mg | ORAL_CAPSULE | Freq: Three times a day (TID) | ORAL | 1 refills | Status: DC

## 2023-05-09 NOTE — Patient Instructions (Signed)
Start oral amoxicillin on 05/13/2023

## 2023-05-09 NOTE — Progress Notes (Unsigned)
Patient ID: Dale Mills, male   DOB: 1965-12-19, 57 y.o.   MRN: [id number]  HPI Mr. Winslett's MRI of the left hip with no clear evidence of infection and overall continues to clinically improve. 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 Anticipate likely suppressive course with oral antibiotics after completion of IV with duration to be determined. PICC line in place. OPAT/Home Health orders. EOT tentatively planned for 05/12/23. Will arrange follow up in ID clinic. Remaining medical and supportive care per CCM/CVTS.   Outpatient Encounter Medications as of 05/09/2023  Medication Sig   ampicillin IVPB Inject 12 g into the vein daily. As a continuous infusion Indication:  Enterococcus faecalis endocarditis  First Dose: Yes Last Day of Therapy: 05/12/2023 Labs - Once weekly:  CBC/D and BMP, Labs - Once weekly: ESR and CRP Method of administration: Ambulatory Pump (Continuous Infusion) Method of administration may be changed at the discretion of home infusion pharmacist based upon assessment of the patient and/or caregiver's ability to self-administer the medication ordered.   ascorbic acid (VITAMIN C) 500 MG tablet Take 1 tablet (500 mg total) by mouth daily.   aspirin EC 81 MG tablet Take 1 tablet (81 mg total) by mouth daily. Swallow whole.   atorvastatin (LIPITOR) 80 MG tablet Take 1 tablet (80 mg total) by mouth daily.   cefTRIAXone (ROCEPHIN) IVPB Inject 2 g into the vein every 12 (twelve) hours. Indication:   Enterococcus faecalis endocarditis  First Dose: Yes Last Day of Therapy:  05/12/2023 Labs - Once weekly:  CBC/D and BMP, Labs - Once weekly: ESR and CRP Method of administration: IV Push Method of administration may be changed at the discretion of home infusion pharmacist based upon assessment of the patient and/or caregiver's ability to self-administer the  medication ordered.   ezetimibe (ZETIA) 10 MG tablet Take 1 tablet (10 mg total) by mouth daily.   Fe Fum-Vit C-Vit B12-FA (TRIGELS-F FORTE) CAPS capsule Take 1 capsule by mouth daily after breakfast.   metoprolol tartrate (LOPRESSOR) 25 MG tablet Take 0.5 tablets (12.5 mg total) by mouth 2 (two) times daily.   Mouthwashes (MOUTH RINSE) LIQD solution 15 mLs by Mouth Rinse route 2 (two) times daily.   pantoprazole (PROTONIX) 40 MG tablet Take 1 tablet (40 mg total) by mouth daily.   potassium chloride (KLOR-CON M) 10 MEQ tablet Take 20 meq daily for 5 days then take 10 meq daily thereafter   tamsulosin (FLOMAX) 0.4 MG CAPS capsule Take 1 capsule (0.4 mg total) by mouth daily after supper.   torsemide (DEMADEX) 20 MG tablet Take Torsemide 40 mg daily for 5 days;then take 20 mg daily thereafter   traZODone (DESYREL) 50 MG tablet Take 1 tablet (50 mg total) by mouth at bedtime.   warfarin (COUMADIN) 4 MG tablet Take 1 tablet (4 mg total) by mouth daily at 4 PM.   No facility-administered encounter medications on file as of 05/09/2023.     Patient Active Problem List   Diagnosis Date Noted   Long term (current) use of anticoagulants 05/02/2023   Atrial fibrillation (HCC) 05/02/2023   Mitral valve replaced 05/02/2023   DVT (deep venous thrombosis) (HCC) 05/02/2023   Pericardial effusion after operative procedure 04/14/2023   Shortness of breath 04/14/2023   Bilateral lower extremity edema 04/14/2023   Debility 04/10/2023   Acute ischemic left posterior cerebral artery (PCA) stroke (HCC) 04/09/2023   S/P  aortic valve replacement and aortoplasty 03/31/2023   Protein-calorie malnutrition, severe 03/30/2023   Acute respiratory failure (HCC) 03/29/2023   Septic shock (HCC) 03/29/2023   Enterococcal bacteremia 03/29/2023   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   Iron deficiency anemia 03/20/2023   Coronary artery disease coronary CT angio did not show any major issues, suspicion for distal  disease 06/12/2020   Angina pectoris (HCC) Canadian classification 2 04/10/2020   Precordial chest pain 02/13/2020   Essential hypertension 02/13/2020   Dyslipidemia 02/13/2020     Health Maintenance Due  Topic Date Due   Hepatitis C Screening  Never done   DTaP/Tdap/Td (1 - Tdap) Never done   Zoster Vaccines- Shingrix (1 of 2) Never done   Colonoscopy  Never done   COVID-19 Vaccine (2 - Pfizer risk series) 05/03/2023     Review of Systems  Physical Exam   BP 106/72   Pulse 87   Temp (!) 97.5 F (36.4 C) (Oral)   Wt 232 lb (105.2 kg)   SpO2 98%   BMI 31.46 kg/m    No results found for: "CD4TCELL" No results found for: "CD4TABS" No results found for: "HIV1RNAQUANT" No results found for: "HEPBSAB" No results found for: "RPR", "LABRPR"  CBC Lab Results  Component Value Date   WBC 8.5 04/24/2023   RBC 3.84 (L) 04/24/2023   HGB 10.1 (L) 04/24/2023   HCT 33.2 (L) 04/24/2023   PLT 353 04/24/2023   MCV 86.5 04/24/2023   MCH 26.3 04/24/2023   MCHC 30.4 04/24/2023   RDW 16.3 (H) 04/24/2023    BMET Lab Results  Component Value Date   NA 136 04/24/2023   K 3.6 04/24/2023   CL 101 04/24/2023   CO2 26 04/24/2023   GLUCOSE 148 (H) 04/24/2023   BUN 19 04/24/2023   CREATININE 0.89 04/24/2023   CALCIUM 9.3 04/24/2023   GFRNONAA >60 04/24/2023      Assessment and Plan  Finish iv abtx, then pull line, transition to 6 wk of oral amox 1gm TID See back in 6 wk We will refer to ophtho and neuro

## 2023-05-09 NOTE — Patient Instructions (Addendum)
Description   Continue taking warfarin 1 tablet (4mg ) daily.  Remain consistent with greens each week (only eats greens occasionally). Recheck INR in 1 week. Coumadin Clinic [phone] Amio 200mg  daily d/c 05/08/23 [id number]

## 2023-05-09 NOTE — Telephone Encounter (Signed)
Per Dr. Drue Second okay for picc to be pulled after last dose on 11/1. Community message forwarded to [employer] with [employer]. Patient aware of plan. Juanita Laster, RMA

## 2023-05-12 ENCOUNTER — Ambulatory Visit: Payer: MEDICAID | Attending: Cardiology

## 2023-05-12 ENCOUNTER — Encounter: Payer: Self-pay | Admitting: Oncology

## 2023-05-12 DIAGNOSIS — Z952 Presence of prosthetic heart valve: Secondary | ICD-10-CM | POA: Diagnosis not present

## 2023-05-12 LAB — ECHOCARDIOGRAM COMPLETE
AV Mean grad: 6.8 mm[Hg]
AV Peak grad: 13 mm[Hg]
Ao pk vel: 1.81 m/s
Area-P 1/2: 3.25 cm2
S' Lateral: 2.5 cm

## 2023-05-15 LAB — ACID FAST CULTURE WITH REFLEXED SENSITIVITIES (MYCOBACTERIA): Acid Fast Culture: NEGATIVE

## 2023-05-17 ENCOUNTER — Other Ambulatory Visit: Payer: Self-pay

## 2023-05-17 ENCOUNTER — Ambulatory Visit: Payer: Self-pay | Admitting: Oncology

## 2023-05-18 ENCOUNTER — Encounter: Payer: Self-pay | Admitting: Neurology

## 2023-05-18 LAB — ACID FAST CULTURE WITH REFLEXED SENSITIVITIES (MYCOBACTERIA): Acid Fast Culture: NEGATIVE

## 2023-05-19 ENCOUNTER — Telehealth: Payer: Self-pay | Admitting: *Deleted

## 2023-05-19 NOTE — Telephone Encounter (Signed)
Informed pt of echo results. Pt verbalized understanding and had no further questions. 

## 2023-05-19 NOTE — Telephone Encounter (Signed)
-----   Message from Laverda Page sent at 05/18/2023  2:04 PM EST ----- Please let the patient know f/u echo showed normal pump function and normal function of mitral and aortic valve prosthesis. Keep scheduled follow up in the office. Thanks

## 2023-05-22 ENCOUNTER — Other Ambulatory Visit (HOSPITAL_COMMUNITY): Payer: Self-pay

## 2023-05-22 ENCOUNTER — Other Ambulatory Visit: Payer: Self-pay | Admitting: Physical Medicine and Rehabilitation

## 2023-05-23 ENCOUNTER — Ambulatory Visit: Payer: MEDICAID | Attending: Cardiology

## 2023-05-23 ENCOUNTER — Telehealth: Payer: Self-pay

## 2023-05-23 DIAGNOSIS — Z952 Presence of prosthetic heart valve: Secondary | ICD-10-CM

## 2023-05-23 DIAGNOSIS — I4891 Unspecified atrial fibrillation: Secondary | ICD-10-CM

## 2023-05-23 LAB — POCT INR: INR: 3.3 — AB (ref 2.0–3.0)

## 2023-05-23 NOTE — Patient Instructions (Signed)
Description   Take 1/2 tablet today and then continue taking warfarin 1 tablet (4mg ) daily.  Remain consistent with greens each week (only eats greens occasionally).  Recheck INR in 1 week. Coumadin Clinic [phone] Amio 200mg  daily d/c 05/08/23 [id number]

## 2023-05-23 NOTE — Telephone Encounter (Signed)
Myrtie Neither, SP [facility] called requesting verbal orders for HHST 1wk6. Orders approved and given

## 2023-05-24 ENCOUNTER — Other Ambulatory Visit: Payer: Self-pay | Admitting: Physical Medicine and Rehabilitation

## 2023-05-24 ENCOUNTER — Other Ambulatory Visit (HOSPITAL_COMMUNITY): Payer: Self-pay

## 2023-05-25 ENCOUNTER — Other Ambulatory Visit: Payer: Self-pay | Admitting: Physical Medicine & Rehabilitation

## 2023-05-25 ENCOUNTER — Other Ambulatory Visit (HOSPITAL_COMMUNITY): Payer: Self-pay

## 2023-05-25 ENCOUNTER — Other Ambulatory Visit: Payer: Self-pay | Admitting: Physical Medicine and Rehabilitation

## 2023-05-25 ENCOUNTER — Telehealth: Payer: Self-pay | Admitting: Cardiology

## 2023-05-25 MED ORDER — WARFARIN SODIUM 4 MG PO TABS: 4.0000 mg | ORAL_TABLET | Freq: Every day | ORAL | 3 refills | Status: DC

## 2023-05-25 NOTE — Telephone Encounter (Signed)
*  STAT* If patient is at the pharmacy, call can be transferred to refill team.   1. Which medications need to be refilled? (please list name of each medication and dose if known)   warfarin (COUMADIN) 4 MG tablet    2. Which pharmacy/location (including street and city if local pharmacy) is medication to be sent to? [facility] - [place], [place] - [address]   3. Do they need a 30 day or 90 day supply? 30

## 2023-05-25 NOTE — Telephone Encounter (Signed)
Patients daughter called stating patient is taking his dose of medications tonight and would like a refill. I have advised her to call PCP to get a f/u appt. Can he have one refill of medications til he can get in with PCP?

## 2023-05-25 NOTE — Telephone Encounter (Signed)
Refill request for warfarin:  Last INR was 3.3 on 05/23/23 Next INR due 05/30/23 LOV was 05/04/23  Refill approved.

## 2023-05-26 ENCOUNTER — Other Ambulatory Visit (HOSPITAL_COMMUNITY): Payer: Self-pay

## 2023-05-26 ENCOUNTER — Other Ambulatory Visit: Payer: Self-pay | Admitting: Cardiology

## 2023-05-26 MED ORDER — POTASSIUM CHLORIDE CRYS ER 10 MEQ PO TBCR: 10.0000 meq | EXTENDED_RELEASE_TABLET | Freq: Every day | ORAL | 3 refills | Status: DC

## 2023-05-26 MED ORDER — ATORVASTATIN CALCIUM 80 MG PO TABS: 80.0000 mg | ORAL_TABLET | Freq: Every day | ORAL | 3 refills | Status: DC

## 2023-05-26 MED ORDER — EZETIMIBE 10 MG PO TABS: 10.0000 mg | ORAL_TABLET | Freq: Every day | ORAL | 3 refills | Status: DC

## 2023-05-26 MED ORDER — METOPROLOL TARTRATE 25 MG PO TABS: 12.5000 mg | ORAL_TABLET | Freq: Two times a day (BID) | ORAL | 3 refills | Status: DC

## 2023-05-26 MED ORDER — TORSEMIDE 20 MG PO TABS: 20.0000 mg | ORAL_TABLET | Freq: Every day | ORAL | 3 refills | Status: DC

## 2023-05-26 NOTE — Telephone Encounter (Signed)
Pt's daughter is requesting a refill on pantoprazole. This medication was prescribed in the hospital. Would Wallis Bamberg, NP like to refill this medication? Please address

## 2023-05-26 NOTE — Telephone Encounter (Signed)
*  STAT* If patient is at the pharmacy, call can be transferred to refill team.   1. Which medications need to be refilled? (please list name of each medication and dose if known) torsemide (DEMADEX) 20 MG tablet  potassium chloride (KLOR-CON M) 10 MEQ tablet  aspirin EC 81 MG tablet   ezetimibe (ZETIA) 10 MG tablet    Fe Fum-Vit C-Vit B12-FA (TRIGELS-F FORTE) CAPS capsule    metoprolol tartrate (LOPRESSOR) 25 MG tablet    pantoprazole (PROTONIX) 40 MG tablet    traZODone (DESYREL) 50 MG tablet    ascorbic acid (VITAMIN C) 500 MG tablet   2. Which pharmacy/location (including street and city if local pharmacy) is medication to be sent to? [facility] - [place], [place] - [address]    3. Do they need a 30 day or 90 day supply? 90 day

## 2023-05-30 ENCOUNTER — Ambulatory Visit: Payer: MEDICAID | Attending: Cardiology

## 2023-05-30 DIAGNOSIS — Z952 Presence of prosthetic heart valve: Secondary | ICD-10-CM | POA: Diagnosis not present

## 2023-05-30 DIAGNOSIS — I4891 Unspecified atrial fibrillation: Secondary | ICD-10-CM | POA: Diagnosis not present

## 2023-05-30 LAB — POCT INR: INR: 1.6 — AB (ref 2.0–3.0)

## 2023-05-30 NOTE — Patient Instructions (Signed)
Description   Take 1 & 1/2 tablets today and then continue taking warfarin 1 tablet (4mg ) daily.  Remain consistent with greens each week (only eats greens occasionally).  Recheck INR in 1 week. Coumadin Clinic [phone] Amio 200mg  daily d/c 05/08/23 [id number]

## 2023-05-31 ENCOUNTER — Other Ambulatory Visit (HOSPITAL_COMMUNITY): Payer: Self-pay

## 2023-06-06 ENCOUNTER — Ambulatory Visit: Payer: MEDICAID | Attending: Cardiology

## 2023-06-06 DIAGNOSIS — I4891 Unspecified atrial fibrillation: Secondary | ICD-10-CM | POA: Diagnosis not present

## 2023-06-06 DIAGNOSIS — Z952 Presence of prosthetic heart valve: Secondary | ICD-10-CM | POA: Diagnosis not present

## 2023-06-06 LAB — POCT INR: INR: 2.3 (ref 2.0–3.0)

## 2023-06-06 NOTE — Patient Instructions (Signed)
Description   Continue taking warfarin 1 tablet (4mg ) daily.  Remain consistent with greens each week (only eats greens occasionally). Recheck INR in 1 week. Coumadin Clinic [phone] Amio 200mg  daily d/c 05/08/23 [id number]

## 2023-06-07 DIAGNOSIS — F419 Anxiety disorder, unspecified: Secondary | ICD-10-CM | POA: Insufficient documentation

## 2023-06-07 DIAGNOSIS — M1990 Unspecified osteoarthritis, unspecified site: Secondary | ICD-10-CM | POA: Insufficient documentation

## 2023-06-07 DIAGNOSIS — F32A Depression, unspecified: Secondary | ICD-10-CM | POA: Insufficient documentation

## 2023-06-11 NOTE — Progress Notes (Signed)
 " Cardiology Office Note:  .   Date:  06/12/2023  [name], DOB [birth year], MRN [id number] PCP: Sabas Norleen PARAS., MD  [facility] Providers Cardiologist:  Lamar Fitch, MD    History of Present Illness: .   Dale Mills is a 57 y.o. 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  05/12/2023 echo EF 60 to 65%, moderate concentric LVH, no evidence of pericardial effusion 04/14/2023 echo EF 60 to 65%, severe concentric LVH, LA mildly dilated, RA moderately dilated, trivial MR without stenosis 33 mm Medtronic present in the mitral position, RV did not relax completely suggesting intrapericardial pressure--large pericardial effusion present without evidence of tamponade, well-seated bioprosthetic aortic valve without stenosis 27 mm Edwards valve present in the aortic position 03/31/2019 for AVR and MVR 05/03/2020 coronary CTA calcium  score 15, 60th percentile, mild atherosclerosis of the distal LAD  He was admitted on 03/28/2023 to [facility] after initially presenting to [facility] with complaints of nausea, weakness, fever, chills.  Workup in the ED revealed he was hypotensive, leukocytosis, AKI with creatinine of 2.6 and tachycardic.  TTE was obtained concerning for mitral valve vegetation he was then transferred to [facility]  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  He underwent AVR and aortic root replacement.  Suffered acute ischemic infarct, felt to be cardioembolic.  Postop day 7 his left arm was noted to be swollen, venous duplex was arranged revealing left internal jugular DVT and left superficial cephalic vein thrombosis he  ultimately transferred to inpatient rehab on 04/07/2023.  He was evaluated by CVTS on 05/01/2023, chest x-ray was concerning for possible pericardial effusion, Dr. Whitman felt it was a left pleural effusion and patient was instructed to increase his torsemide  40 mg daily along with potassium supplementation for 5 days.  Evaluated 05/04/2023 in our office, feeling okay, continue to diurese.  Evaluated by Dr. Maryjane on 05/08/2023, amiodarone  was stopped.  He presents today for follow-up.  Has been doing well from a cardiac perspective, offers no formal complaints.  He is recently moved back in his own home, still ambulating with a walker and working with PT. He denies chest pain, palpitations, dyspnea, pnd, orthopnea, n, v, dizziness, syncope, edema, weight gain, or early satiety.   ROS: Review of Systems  Constitutional:  Positive for malaise/fatigue.  HENT: Negative.    Eyes:        Vision loss following stroke  Respiratory: Negative.    Cardiovascular: Negative.   Gastrointestinal: Negative.   Genitourinary: Negative.   Musculoskeletal: Negative.   Skin: Negative.   Neurological: Negative.   Endo/Heme/Allergies:  Bruises/bleeds easily.  Psychiatric/Behavioral: Negative.      Studies Reviewed: .        Cardiac Studies & Procedures       ECHOCARDIOGRAM  ECHOCARDIOGRAM COMPLETE 05/12/2023  Narrative ECHOCARDIOGRAM REPORT    Patient Name:   Dale Mills Date of Exam: 05/12/2023 Medical Rec #:  [id number]      Height:       72.0 in Accession #:    [id number]     Weight:       232.0 lb Date of Birth:  14-Feb-1966      BSA:  2.269 m Patient Age:    57 years       BP:           106/72 mmHg Patient Gender: M              HR:           79 bpm. Exam Location:  [facility]  Procedure: 2D Echo, Cardiac Doppler, Color Doppler and Strain Analysis  Indications:    S/P mitral valve replacement [Z95.2 (ICD-10-CM)]  History:        Patient has prior history of Echocardiogram  examinations, most recent 04/14/2023. CAD. _ Aorta Aortic root replacement; Date:03/31/2023. Aortic Valve: 27 mm Edwards Resilia valve is present in the aortic position. Procedure Date: 03/31/2023. Mitral Valve: 33 mm Medtronic bioprosthetic valve valve is present in the mitral position. Procedure Date: 03/31/2023.  Sonographer:    Charlie Jointer RDCS Referring Phys: [phone] LINDSAY B ROBERTS  IMPRESSIONS   1. Left ventricular ejection fraction, by estimation, is 60 to 65%. The left ventricle has normal function. The left ventricle has no regional wall motion abnormalities. There is moderate concentric left ventricular hypertrophy. Left ventricular diastolic parameters were normal. The average left ventricular global longitudinal strain is 11.4 %. The global longitudinal strain is abnormal. 2. Right ventricular systolic function is normal. The right ventricular size is normal. 3. Left atrial size was mildly dilated. 4. 33 Mosaic porcinr MVR mean gradient 4 mm Hg. No evidence of mitral valve regurgitation. There is a 33 mm Medtronic bioprosthetic valve present in the mitral position. Procedure Date: 03/31/2023. Echo findings are consistent with normal structure and function of the mitral valve prosthesis. 5. Aortic valve regurgitation is not visualized. There is a 27 mm Edwards Resilia valve present in the aortic position. Procedure Date: 03/31/2023. Echo findings are consistent with normal structure and function of the aortic valve prosthesis. 6. Root and ascending aorta replacement . Aortic Normal arch and DTA and root/ascending aorta has been replaced. 7. The inferior vena cava is normal in size with greater than 50% respiratory variability, suggesting right atrial pressure of 3 mmHg.  FINDINGS Left Ventricle: Left ventricular ejection fraction, by estimation, is 60 to 65%. The left ventricle has normal function. The left ventricle has no regional wall motion abnormalities. The average left  ventricular global longitudinal strain is 11.4 %. The global longitudinal strain is abnormal. The left ventricular internal cavity size was normal in size. There is moderate concentric left ventricular hypertrophy. Left ventricular diastolic parameters were normal. Indeterminate filling pressures.  Right Ventricle: The right ventricular size is normal. No increase in right ventricular wall thickness. Right ventricular systolic function is normal.  Left Atrium: Left atrial size was mildly dilated.  Right Atrium: Right atrial size was normal in size.  Pericardium: There is no evidence of pericardial effusion.  Mitral Valve: 33 Mosaic porcinr MVR mean gradient 4 mm Hg. No evidence of mitral valve regurgitation. There is a 33 mm Medtronic bioprosthetic valve present in the mitral position. Procedure Date: 03/31/2023. Echo findings are consistent with normal structure and function of the mitral valve prosthesis. MV peak gradient, 7.4 mmHg. The mean mitral valve gradient is 4.0 mmHg.  Tricuspid Valve: The tricuspid valve is normal in structure. Tricuspid valve regurgitation is not demonstrated. No evidence of tricuspid stenosis.  Aortic Valve: Aortic valve regurgitation is not visualized. Aortic valve mean gradient measures 6.8 mmHg. Aortic valve peak gradient measures 13.0 mmHg. There is a 27 mm Edwards Resilia valve present in the aortic position. Procedure  Date: 03/31/2023. Echo findings are consistent with normal structure and function of the aortic valve prosthesis.  Pulmonic Valve: The pulmonic valve was normal in structure. Pulmonic valve regurgitation is mild. No evidence of pulmonic stenosis.  Aorta: Root and ascending aorta replacement. Normal arch and DTA and the aortic root/ascending aorta has been replaced.  Venous: A normal flow pattern is recorded from the right upper pulmonary vein. The inferior vena cava is normal in size with greater than 50% respiratory variability, suggesting  right atrial pressure of 3 mmHg.  IAS/Shunts: No atrial level shunt detected by color flow Doppler.   LEFT VENTRICLE PLAX 2D LVIDd:         4.60 cm LVIDs:         2.50 cm 2D Longitudinal Strain LV PW:         1.60 cm 2D Strain GLS Avg:     11.4 % LV IVS:        1.60 cm   RIGHT VENTRICLE             IVC RV Basal diam:  4.00 cm     IVC diam: 1.50 cm RV Mid diam:    3.80 cm RV S prime:     15.90 cm/s TAPSE (M-mode): 2.1 cm  LEFT ATRIUM             Index        RIGHT ATRIUM           Index LA diam:        4.20 cm 1.85 cm/m   RA Area:     20.90 cm LA Vol (A2C):   77.1 ml 33.98 ml/m  RA Volume:   55.40 ml  24.41 ml/m LA Vol (A4C):   81.7 ml 36.00 ml/m LA Biplane Vol: 79.5 ml 35.03 ml/m AORTIC VALVE                    PULMONIC VALVE AV Vmax:           180.50 cm/s  PR End Diast Vel: 6.15 msec AV Vmean:          116.750 cm/s AV VTI:            0.321 m AV Peak Grad:      13.0 mmHg AV Mean Grad:      6.8 mmHg LVOT Vmax:         148.50 cm/s LVOT Vmean:        94.800 cm/s LVOT VTI:          0.258 m LVOT/AV VTI ratio: 0.80  AORTA Ao Root diam: 4.00 cm Ao Asc diam:  3.40 cm Ao Desc diam: 2.10 cm  MITRAL VALVE MV Area (PHT): 3.25 cm     SHUNTS MV Peak grad:  7.4 mmHg     Systemic VTI: 0.26 m MV Mean grad:  4.0 mmHg MV Vmax:       1.36 m/s MV Vmean:      93.9 cm/s MV Decel Time: 234 msec MV E velocity: 114.50 cm/s MV A velocity: 111.00 cm/s MV E/A ratio:  1.03  Redell Leiter MD Electronically signed by Redell Leiter MD Signature Date/Time: 05/12/2023/6:11:49 PM    Final   TEE  ECHO INTRAOPERATIVE TEE 03/31/2023  Narrative *INTRAOPERATIVE TRANSESOPHAGEAL REPORT *    Patient Name:   KIPTYN RAFUSE Campisi Date of Exam: 03/31/2023 Medical Rec #:  [id number]      Height:       71.0 in Accession #:  [id number]     Weight:       247.1 lb Date of Birth:  26-Oct-1965      BSA:          2.31 m Patient Age:    57 years       BP:           131/56 mmHg Patient Gender: M               HR:           74 bpm. Exam Location:  Anesthesiology  Transesophogeal exam was perform intraoperatively during surgical procedure. Patient was closely monitored under general anesthesia during the entirety of examination.  Indications:     I34.8 Other nonrheumatic mitral valve disorders Performing Phys: [id number] DEWARD KALLMAN Diagnosing Phys: Lonni Custard MD  Complications: No known complications during this procedure. POST-OP IMPRESSIONS _ Left Ventricle: has mildly reduced systolic function, with an ejection fraction of 55%. The wall motion is septal dyssynchrony in setting of pacing. _ Right Ventricle: mildly reduced function. The cavity was mildly dilated. _ Aorta: there is no dissection present in the aorta. _ Aortic Valve: A bioprosthetic bioprosthetic valve was placed, leaflets are freely mobile and leaflets thin. trivial centrally. No perivalvular leak noted.AV gradient post replacement is 3 mmHg. _ Mitral Valve: A bioprosthetic bioprosthetic valve was placed, leaflets are not freely mobile and leaflets thin.MV post gradient 2 mmHg. Multipl tiny regurgitant jets presenton COlor doppler Appear to arise within sewing ring and improved with protamine . Valve well seated. _ Tricuspid Valve: There is trivial regurgitation.  PRE-OP FINDINGS Left Ventricle: The left ventricle has normal systolic function, with an ejection fraction of 55-60%. The cavity size was normal.   Right Ventricle: The right ventricle has normal systolic function. The cavity was normal. There is no increase in right ventricular wall thickness.  Left Atrium: Left atrial size was not assessed. No left atrial/left atrial appendage thrombus was detected. The left atrial appendage is well visualized and there is no evidence of thrombus present. Left atrial appendage velocity is normal at greater than 40 cm/s.  Right Atrium: Right atrial size was not assessed.  Interatrial Septum: No atrial level shunt  detected by color flow Doppler. There is no evidence of a patent foramen ovale.  Pericardium: A small pericardial effusion is present. The pericardial effusion is anterior to the right ventricle. There is a moderate pleural effusion in the left lateral region.  Mitral Valve: The mitral valve is dilated. Mitral valve regurgitation is severe by color flow Doppler. There is No evidence of mitral stenosis. Mobile echodensity orginating from atrial side of anterior mitral leaflet representing vegetation. Anterior mitral leaflet with appears to have a perforation in it associated with vegetation and is without prolapse or flail.  Tricuspid Valve: The tricuspid valve unable to evaluate. Cannot rule out vegetation. Tricuspid valve regurgitation appears to be moderate but difficult to assess given poor imaging.  Aortic Valve: The aortic valve is tricuspid Aortic valve regurgitation is severe by color flow Doppler. There is no stenosis of the aortic valve, with a calculated valve area of 3.28 cm. Mobile echodensity in LVOT connected to non or left cusp representing vegetation. Severe AI orginating centrally and between left/non commisure.  Pulmonic Valve: The pulmonic valve was normal in structure, with normal. Pulmonic valve regurgitation is trivial by color flow Doppler.   Aorta: There is moderate dilatation of the aortic root, measuring 48 mm.  +--------------+--------++ LEFT VENTRICLE         +--------------+--------++  PLAX 2D                +--------------+--------++ LVOT diam:    2.40 cm  +--------------+--------++ LVOT Area:    4.52 cm +--------------+--------++                        +--------------+--------++  +------------------+-----------++ AORTIC VALVE                  +------------------+-----------++ AV Area (Vmax):   3.00 cm    +------------------+-----------++ AV Area (Vmean):  3.11 cm    +------------------+-----------++ AV Area (VTI):     3.28 cm    +------------------+-----------++ AV Vmax:          133.50 cm/s +------------------+-----------++ AV Vmean:         90.350 cm/s +------------------+-----------++ AV VTI:           0.232 m     +------------------+-----------++ AV Peak Grad:     7.1 mmHg    +------------------+-----------++ AV Mean Grad:     4.0 mmHg    +------------------+-----------++ LVOT Vmax:        88.40 cm/s  +------------------+-----------++ LVOT Vmean:       62.100 cm/s +------------------+-----------++ LVOT VTI:         0.168 m     +------------------+-----------++ LVOT/AV VTI ratio:0.72        +------------------+-----------++ AR PHT:           257 msec    +------------------+-----------++  +-------------+---------++       +---------------+-----------++ MITRAL VALVE                 TRICUSPID VALVE            +-------------+---------++       +---------------+-----------++ MV Peak grad:8.0 mmHg        TR Peak grad:  63.0 mmHg   +-------------+---------++       +---------------+-----------++ MV Mean grad:2.3 mmHg        TR Vmax:       397.00 cm/s +-------------+---------++       +---------------+-----------++ MV Vmax:     1.41 m/s  +-------------+---------++       +--------------+-------+ MV Vmean:    67.3 cm/s       SHUNTS                +-------------+---------++       +--------------+-------+ MV VTI:      0.29 m          Systemic VTI: 0.17 m  +-------------+---------++       +--------------+-------+ +----------------+-----------++  Systemic Diam:2.40 cm MR Peak grad:   83.5 mmHg    +--------------+-------+ +----------------+-----------++ MR Mean grad:   45.0 mmHg   +----------------+-----------++ MR Vmax:        457.00 cm/s +----------------+-----------++ MR Vmean:       307.0 cm/s  +----------------+-----------++ MR PISA:        18.16 cm    +----------------+-----------++ MR PISA Eff ROA:153 mm     +----------------+-----------++ MR PISA Radius: 1.70 cm     +----------------+-----------++   Lonni Custard MD Electronically signed by Lonni Custard MD Signature Date/Time: 04/09/2023/11:09:24 AM    Final    CT SCANS  CT CORONARY MORPH W/CTA COR W/SCORE 05/01/2020  Addendum 05/03/2020 10:50 PM ADDENDUM REPORT: 05/03/2020 22:47  EXAM: Cardiac/Coronary  CT  TECHNIQUE: The patient was scanned on a [employer].  FINDINGS: A 120 kV prospective scan was triggered in the  descending thoracic aorta at 111 HU's. Axial non-contrast 3 mm slices were carried out through the heart. The data set was analyzed on a dedicated work station and scored using the Agatson method. Gantry rotation speed was 250 msecs and collimation was .6 mm. No beta blockade and 0.8 mg of sl NTG was given. The 3D data set was reconstructed in 5% intervals of the 67-82 % of the R-R cycle. Diastolic phases were analyzed on a dedicated work station using MPR, MIP and VRT modes. The patient received 80 cc of contrast.  Aorta: Normal size. Scattered calcifications in the descending aorta. No dissection.  Aortic Valve:  Trileaflet.  No calcifications.  Coronary Arteries:  Normal coronary origin.  Right dominance.  RCA is a large dominant artery that gives rise to PDA and PLVB. There is no plaque.  Left main is a short artery that gives rise to LAD and LCX arteries. There is no plaque.  LAD is a large vessel that gives rise to a moderate sized D1 and large branching D2. There is mild calcified plaque in the mid LAD after the takeoff of the second diagonal with associated stenosis of 25-49%. Study is sub optimal for evaluation of non calcified plaque in the D1 and D2 or distal LAD due to significant noise artifact.  LCX is a non-dominant artery that gives rise to one large OM1 and OM2 branches. There is no plaque in  the proximal and mid LCx. Cannot assess the OM1 and OM2 branches or distal LCx for non calcified plaque due to significant noise artifact due to obesity. There is no calcified plaque.  Other findings:  Normal pulmonary vein drainage into the left atrium.  Normal let atrial appendage without a thrombus.  Normal size of the pulmonary artery.  IMPRESSION: 1. Coronary calcium  score of 15. This was 60th percentile for age and sex matched control.  2.  Normal coronary origin with right dominance.  3.  Mild Atherosclerosis of the distal LAD.  CAD RADS 2.  4.  Consider non atherosclerotic causes of chest pain.  5.  Recommend preventive therapy and risk factor modification.  6. If clinical suspicion high, consider nuclear stress imaging given sub optimal study for assessment of non calcified plaque in the diagonal and OM branches as well as distal LAD and LCx.  Wilbert Bihari   Electronically Signed By: Wilbert Bihari On: 05/03/2020 22:47  Narrative EXAM: OVER-READ INTERPRETATION  CT CHEST  The following report is an over-read performed by radiologist Dr. Toribio Aye of [facility] on 05/01/2020. This over-read does not include interpretation of cardiac or coronary anatomy or pathology. The coronary calcium  score/coronary CTA interpretation by the cardiologist is attached.  COMPARISON:  None.  FINDINGS: Aortic atherosclerosis. Within the visualized portions of the thorax there are no suspicious appearing pulmonary nodules or masses, there is no acute consolidative airspace disease, no pleural effusions, no pneumothorax and no lymphadenopathy. Visualized portions of the upper abdomen are unremarkable. There are no aggressive appearing lytic or blastic lesions noted in the visualized portions of the skeleton.  IMPRESSION: 1.  Aortic Atherosclerosis (ICD10-I70.0).  Electronically Signed: By: Toribio Aye M.D. On: 05/01/2020 09:43          Risk  Assessment/Calculations:    CHA2DS2-VASc Score = 4   This indicates a 4.8% annual risk of stroke. The patient's score is based upon: CHF History: 0 HTN History: 1 Diabetes History: 0 Stroke History: 2 Vascular Disease History: 1 Age Score: 0 Gender Score:  0            Physical Exam:   VS:  BP 124/78   Pulse 84   Ht 6' (1.829 m)   Wt 237 lb (107.5 kg)   SpO2 98%   BMI 32.14 kg/m    Wt Readings from Last 3 Encounters:  06/12/23 237 lb (107.5 kg)  05/09/23 232 lb (105.2 kg)  05/08/23 230 lb (104.3 kg)    GEN: Well nourished, well developed in no acute distress NECK: No JVD; No carotid bruits CARDIAC: RRR, 1/6 systolic murmur, rubs, gallops RESPIRATORY:  Clear to auscultation without rales, wheezing or rhonchi  ABDOMEN: Soft, non-tender, non-distended EXTREMITIES:  No edema; No deformity   ASSESSMENT AND PLAN: .   S/p MVR & s/p AVR-MVR replaced with 31 mm Mosaic porcine valve and aortic root Bentall procedure with a 27 mm connect pericardial valve conduit. Will arrange for SBE.   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  Does not have a plan to see neurology however if his vision does not improve some he will be evaluated by a neuro-ophthalmologist. Currently followed by ID.   Paroxysmal atrial fibrillation/hypercoagulable state-he is maintaining sinus rhythm today, CHA2DS2-VASc score of 4--anticoagulated with Coumadin .  There is not much in his chart indicate the events regarding atrial fibrillation,  he was started on amiodarone  and Coumadin -- amiodarone  has since been d/c'd by [facility]. He will establish with a new PCP on Monday and anticipates they will check labs.   CAD-mild, nonobstructive coronary CTA in 2021, continue atorvastatin  80 mg daily, continue Zetia  10 mg daily, continue metoprolol  12.5 mg twice daily.  Hypertension-blood pressure is  well-controlled at 124/78, currently not on any antihypertensive agents.  Dyslipidemia-monitored by PCP, currently on atorvastatin  80 mg daily, Zetia  10 mg daily.       Dispo: Follow up in 3-4 months with Dr. Krasowski.   Signed, Delon JAYSON Hoover, NP  "

## 2023-06-12 ENCOUNTER — Other Ambulatory Visit: Payer: Self-pay | Admitting: *Deleted

## 2023-06-12 ENCOUNTER — Ambulatory Visit: Payer: MEDICAID | Attending: Cardiology | Admitting: Cardiology

## 2023-06-12 ENCOUNTER — Encounter: Payer: Self-pay | Admitting: Cardiology

## 2023-06-12 VITALS — BP 124/78 | HR 84 | Ht 72.0 in | Wt 237.0 lb

## 2023-06-12 DIAGNOSIS — I639 Cerebral infarction, unspecified: Secondary | ICD-10-CM

## 2023-06-12 DIAGNOSIS — I480 Paroxysmal atrial fibrillation: Secondary | ICD-10-CM | POA: Diagnosis not present

## 2023-06-12 DIAGNOSIS — Z952 Presence of prosthetic heart valve: Secondary | ICD-10-CM

## 2023-06-12 DIAGNOSIS — I2510 Atherosclerotic heart disease of native coronary artery without angina pectoris: Secondary | ICD-10-CM

## 2023-06-12 DIAGNOSIS — E785 Hyperlipidemia, unspecified: Secondary | ICD-10-CM

## 2023-06-12 DIAGNOSIS — D6859 Other primary thrombophilia: Secondary | ICD-10-CM | POA: Diagnosis not present

## 2023-06-12 MED ORDER — AMOXICILLIN 500 MG PO TABS: 2000.0000 mg | ORAL_TABLET | Freq: Once | ORAL | 3 refills | Status: AC

## 2023-06-12 NOTE — Patient Instructions (Addendum)
Medication Instructions:   Take Amoxicillin 2,000mg  1 one hour before dental appointment.  *If you need a refill on your cardiac medications before your next appointment, please call your pharmacy*   Lab Work: None ordered    Testing/Procedures: None ordered    Follow-Up: At [facility], you and your health needs are our priority.  As part of our continuing mission to provide you with exceptional heart care, we have created designated Provider Care Teams.  These Care Teams include your primary Cardiologist (physician) and Advanced Practice Providers (APPs -  Physician Assistants and Nurse Practitioners) who all work together to provide you with the care you need, when you need it.  We recommend signing up for the patient portal called "MyChart".  Sign up information is provided on this After Visit Summary.  MyChart is used to connect with patients for Virtual Visits (Telemedicine).  Patients are able to view lab/test results, encounter notes, upcoming appointments, etc.  Non-urgent messages can be sent to your provider as well.   To learn more about what you can do with MyChart, go to [email or web address].    Your next appointment:   3 month(s)  Provider:   Gypsy Balsam, MD

## 2023-06-13 ENCOUNTER — Ambulatory Visit: Payer: MEDICAID | Attending: Cardiology

## 2023-06-13 DIAGNOSIS — I4891 Unspecified atrial fibrillation: Secondary | ICD-10-CM | POA: Diagnosis not present

## 2023-06-13 DIAGNOSIS — Z952 Presence of prosthetic heart valve: Secondary | ICD-10-CM | POA: Diagnosis not present

## 2023-06-13 LAB — POCT INR: INR: 2.2 (ref 2.0–3.0)

## 2023-06-13 NOTE — Patient Instructions (Signed)
Description   Continue taking warfarin 1 tablet (4mg ) daily.  Remain consistent with greens each week (only eats greens occasionally).  Recheck INR in 2 weeks.  [facility] [phone] Amio 200mg  daily d/c 05/08/23 [id number]

## 2023-06-19 ENCOUNTER — Encounter: Payer: Self-pay | Admitting: Internal Medicine

## 2023-06-19 ENCOUNTER — Other Ambulatory Visit: Payer: Self-pay

## 2023-06-19 ENCOUNTER — Ambulatory Visit: Payer: MEDICAID | Admitting: Internal Medicine

## 2023-06-19 VITALS — BP 123/85 | HR 62 | Resp 16 | Ht 72.0 in | Wt 244.0 lb

## 2023-06-19 DIAGNOSIS — I330 Acute and subacute infective endocarditis: Secondary | ICD-10-CM

## 2023-06-19 DIAGNOSIS — B952 Enterococcus as the cause of diseases classified elsewhere: Secondary | ICD-10-CM

## 2023-06-19 DIAGNOSIS — I6349 Cerebral infarction due to embolism of other cerebral artery: Secondary | ICD-10-CM

## 2023-06-19 NOTE — Progress Notes (Signed)
Patient ID: Dale Mills, male   DOB: 1966/01/02, 57 y.o.   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 On coumadin.Tissue cx + on 9/20. But blood cx negative on 9/18. He still complains of left hipl pain. MRI during hospitalization didn't show any clear evidence of infection. He has tolerated abtx. He continues to get pt/ot. Still has visual deficits from embolic stroke. He was seen in clinic 5 wks ago, with plans to continue oral abtx for additional 6 wks at that time.  Still using walker for stability. Still participating PT  Lab Results  Component Value Date   ESRSEDRATE 29 (H) 04/24/2023   Lab Results  Component Value Date   CRP 2.0 (H) 04/24/2023     Outpatient Encounter Medications as of 06/19/2023  Medication Sig   amoxicillin (AMOXIL) 500 MG capsule Take 2 capsules (1,000 mg total) by mouth 3 (three) times daily.   ascorbic acid (VITAMIN C) 500 MG tablet Take 1 tablet (500 mg total) by mouth daily.   aspirin EC 81 MG tablet Take 1 tablet (81 mg total) by mouth daily. Swallow whole.   atorvastatin (LIPITOR) 80 MG tablet Take 1 tablet (80 mg total) by mouth daily.   ezetimibe (ZETIA) 10 MG tablet Take 1 tablet (10 mg total) by mouth daily.   Fe Fum-Vit C-Vit B12-FA (TRIGELS-F FORTE) CAPS capsule Take 1 capsule by mouth daily after breakfast.   metoprolol tartrate (LOPRESSOR) 25 MG tablet Take 0.5 tablets (12.5 mg total) by mouth 2 (two) times daily.   Mouthwashes (MOUTH RINSE) LIQD solution 15 mLs by Mouth Rinse route 2 (two) times daily.   pantoprazole (PROTONIX) 40 MG tablet Take 1 tablet (40 mg total) by mouth daily.   potassium chloride (KLOR-CON M) 10 MEQ tablet Take 1 tablet (10 mEq total) by mouth daily.   tamsulosin (FLOMAX) 0.4 MG CAPS capsule Take 1 capsule (0.4 mg total) by mouth daily after supper.   torsemide (DEMADEX) 20 MG tablet  Take 1 tablet (20 mg total) by mouth daily.   traZODone (DESYREL) 50 MG tablet Take 1 tablet (50 mg total) by mouth at bedtime.   warfarin (COUMADIN) 4 MG tablet Take 1 tablet (4 mg total) by mouth daily at 4 PM.   No facility-administered encounter medications on file as of 06/19/2023.     Patient Active Problem List   Diagnosis Date Noted   Anxiety    Arthritis    Depression    DJD (degenerative joint disease)    Long term (current) use of anticoagulants 05/02/2023   Atrial fibrillation (HCC) 05/02/2023   Mitral valve replaced 05/02/2023   DVT (deep venous thrombosis) (HCC) 05/02/2023   Pericardial effusion after operative procedure 04/14/2023   Shortness of breath 04/14/2023   Bilateral lower extremity edema 04/14/2023   Debility 04/10/2023   Acute ischemic left posterior cerebral artery (PCA) stroke (HCC) 04/09/2023   S/P aortic valve replacement and aortoplasty 03/31/2023   Protein-calorie malnutrition, severe 03/30/2023   Acute respiratory failure (HCC) 03/29/2023   Septic shock (HCC) 03/29/2023   Enterococcal bacteremia 03/29/2023   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   Iron deficiency anemia 03/20/2023   Coronary artery disease coronary CT angio did not show any major issues, suspicion for distal disease 06/12/2020   Angina pectoris (HCC) Canadian classification 2 04/10/2020   Precordial chest pain 02/13/2020   Essential hypertension 02/13/2020  Dyslipidemia 02/13/2020     Health Maintenance Due  Topic Date Due   Hepatitis C Screening  Never done   DTaP/Tdap/Td (1 - Tdap) Never done   Zoster Vaccines- Shingrix (1 of 2) Never done   Colonoscopy  Never done   COVID-19 Vaccine (2 - Pfizer risk series) 05/03/2023     Review of Systems 12 point ros is negative except what is mentioned above Physical Exam   BP 123/85   Pulse 62   Resp 16   Ht 6' (1.829 m)   Wt 244 lb (110.7 kg) Comment: WITH BOOTS  BMI 33.09 kg/m   Physical Exam  Constitutional: He is  oriented to person, place, and time. He appears well-developed and well-nourished. No distress.  HENT:  Mouth/Throat: Oropharynx is clear and moist. No oropharyngeal exudate.  Cardiovascular: Normal rate, regular rhythm and normal heart sounds. Exam reveals no gallop and no friction rub.  No murmur heard.  Pulmonary/Chest: Effort normal and breath sounds normal. No respiratory distress. He has no wheezes.  Abdominal: Soft. Bowel sounds are normal. He exhibits no distension. There is no tenderness.  Lymphadenopathy:  He has no cervical adenopathy.  Neurological: He is alert and oriented to person, place, and time.  Skin: Skin is warm and dry. No rash noted. No erythema.  Psychiatric: He has a normal mood and affect. His behavior is normal.    CBC Lab Results  Component Value Date   WBC 8.5 04/24/2023   RBC 3.84 (L) 04/24/2023   HGB 10.1 (L) 04/24/2023   HCT 33.2 (L) 04/24/2023   PLT 353 04/24/2023   MCV 86.5 04/24/2023   MCH 26.3 04/24/2023   MCHC 30.4 04/24/2023   RDW 16.3 (H) 04/24/2023    BMET Lab Results  Component Value Date   NA 136 04/24/2023   K 3.6 04/24/2023   CL 101 04/24/2023   CO2 26 04/24/2023   GLUCOSE 148 (H) 04/24/2023   BUN 19 04/24/2023   CREATININE 0.89 04/24/2023   CALCIUM 9.3 04/24/2023   GFRNONAA >60 04/24/2023   Lab Results  Component Value Date   ESRSEDRATE 22 (H) 06/19/2023   Lab Results  Component Value Date   CRP <3.0 06/19/2023      Assessment and Plan  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 Coumadin level are good Will check sed rate and crp to find end date.  Establishing new pcp   Addendum = labs still slightly elevated. Plan to continue with additional 4 weeks of amoxicillin

## 2023-06-20 LAB — C-REACTIVE PROTEIN: CRP: <3 mg/L (ref <8.0)

## 2023-06-20 LAB — SEDIMENTATION RATE: Sed Rate: 22 mm/h — ABNORMAL HIGH (ref 0–20)

## 2023-06-27 ENCOUNTER — Ambulatory Visit: Payer: MEDICAID | Attending: Cardiology

## 2023-06-27 DIAGNOSIS — Z952 Presence of prosthetic heart valve: Secondary | ICD-10-CM | POA: Diagnosis not present

## 2023-06-27 DIAGNOSIS — I4891 Unspecified atrial fibrillation: Secondary | ICD-10-CM

## 2023-06-27 LAB — POCT INR: INR: 1.8 — AB (ref 2.0–3.0)

## 2023-06-27 NOTE — Patient Instructions (Signed)
Description   Take 1.5 tablets today and then continue taking warfarin 1 tablet (4mg ) daily.  Remain consistent with greens each week (only eats greens occasionally).  Recheck INR in 3 weeks.  Coumadin Clinic [phone] Amio 200mg  daily d/c 05/08/23 [id number]

## 2023-06-29 NOTE — Progress Notes (Signed)
 Initial neurology clinic note  Dale Mills MRN: [id number] DOB: 1966-02-24  Referring provider: Luiz Channel, MD  Primary care provider: Silvano Angeline FALCON, NP  Reason for consult:  stroke  Subjective:  This is Dale Mills, a 57 y.o. left-handed male with a medical history of endocarditis (03/2023) c/b embolic stroke s/p AV repair, HTN, CAD, pre-DM, recurrent UTIs with pyelonephritis, depression, and anxiety who presents to neurology clinic with stroke. The patient is accompanied by daughter.  Patient had been feeling unwell for a while. There was concerns that symptoms started after left hip surgery in 10/2022. Patient presented to hospital in 03/2023 after blacking out at work. He also had nausea, fevers, chills, and weakness. He was found to be hypotensive with leukocytosis and AKI in ED. Echo showed mitral valve vegetation so he was sent to [facility] hospital on 03/28/23. 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  He underwent AVR and aortic root replacement.  Suffered acute ischemic infarct, felt to be cardioembolic.  Postop day 7 his left arm was noted to be swollen, venous duplex was arranged revealing left internal jugular DVT and left superficial cephalic vein thrombosis he ultimately transferred to inpatient rehab on 04/07/2023.   He was evaluated by CVTS on 05/01/2023, chest x-ray was concerning for possible pericardial effusion, Dr. Whitman felt it was a left pleural effusion and patient was instructed to increase his torsemide  40 mg daily along with potassium supplementation for 5 days.  Evaluated 05/04/2023 in our office, feeling okay, continue to diurese.  Evaluated by Dr. Maryjane on 05/08/2023, amiodarone  was stopped.   While in the hospital, patient had  persistent AMS, and CTH showed a right PCA infarct. MRI brain confirmed an embolic appearing right PCA infarct. 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 He was discharged to inpatient rehab (was there for 1 month).  Since discharge, he has made a lot of progress per daughter. Patient continues to endorse left sided visual field loss. He occasionally has flashing lights and visual hallucinations in his vision as well. He has noticed worse memory as well. Daughters has concerns about his cognition. Daughters are taking care of his finances and he does not drive. He sniffs a lot and contorts his face. He also has weird hand movements while talking that is new. Patient is unaware of this. He lives alone.  He does not have clear dysarthria or dysphagia. Both feet have pain and numbness. This has been present since he was in the hospital. He has seen podiatry who discussed gabapentin. Patient walks with a walker and feels his legs are weak, but this may have been since hip surgery and not clearly different since his hospitalization.  He now walks with a walker and is working with PT. He has speech therapy. All the therapy is ending soon. Patient is currently on asa 81 mg daily, warfarin, lipitor  80 mg daily, and zetia  10 mg daily (for secondary stroke prevention). While he is prescribed these, it is unclear what he is taking or not, but patient thinks he takes everything he was given.  Patient continues on antibiotics for endocarditis.  MEDICATIONS:  Outpatient Encounter Medications as of 07/13/2023  Medication Sig   ascorbic acid  (VITAMIN C ) 500 MG tablet  Take 1 tablet (500 mg total) by mouth daily.   aspirin  EC 81 MG tablet Take 1 tablet (81 mg total) by mouth daily. Swallow whole.   atorvastatin  (LIPITOR ) 80 MG tablet Take 1 tablet (80 mg total) by mouth daily.   ezetimibe  (ZETIA ) 10 MG tablet Take 1  tablet (10 mg total) by mouth daily.   Fe Fum-Vit C-Vit B12-FA (TRIGELS-F FORTE) CAPS capsule Take 1 capsule by mouth daily after breakfast.   metoprolol  tartrate (LOPRESSOR ) 25 MG tablet Take 0.5 tablets (12.5 mg total) by mouth 2 (two) times daily.   Mouthwashes (MOUTH RINSE) LIQD solution 15 mLs by Mouth Rinse route 2 (two) times daily.   pantoprazole  (PROTONIX ) 40 MG tablet Take 1 tablet (40 mg total) by mouth daily.   potassium chloride  (KLOR-CON  M) 10 MEQ tablet Take 1 tablet (10 mEq total) by mouth daily.   tamsulosin  (FLOMAX ) 0.4 MG CAPS capsule Take 1 capsule (0.4 mg total) by mouth daily after supper.   torsemide  (DEMADEX ) 20 MG tablet Take 1 tablet (20 mg total) by mouth daily.   traZODone  (DESYREL ) 50 MG tablet Take 1 tablet (50 mg total) by mouth at bedtime.   warfarin (COUMADIN ) 4 MG tablet Take 1 tablet (4 mg total) by mouth daily at 4 PM.   [DISCONTINUED] amoxicillin  (AMOXIL ) 500 MG capsule Take 2 capsules (1,000 mg total) by mouth 3 (three) times daily.   No facility-administered encounter medications on file as of 07/13/2023.    PAST MEDICAL HISTORY: 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   Enterococcal bacteremia 03/29/2023   Essential hypertension 02/13/2020   Iron deficiency anemia 03/20/2023   Long term (current) use of anticoagulants 05/02/2023   Mitral valve replaced  05/02/2023   Pericardial effusion after operative procedure 04/14/2023   Precordial chest pain 02/13/2020   Protein-calorie malnutrition, severe 03/30/2023   S/P aortic valve replacement and aortoplasty 03/31/2023   Septic shock (HCC) 03/29/2023   Shortness of breath 04/14/2023    PAST SURGICAL HISTORY: Past Surgical History:  Procedure Laterality Date   ASCENDING AORTIC ROOT REPLACEMENT N/A 03/31/2023   Procedure: ASCENDING AORTIC ROOT REPLACEMENT UTILIZING 27 KONECT RESILIA  AORTIC VALVE CONDUIT;  Surgeon: Maryjane Mt, MD;  Location: [facility] OR;  Service: Open Heart Surgery;  Laterality: N/A;   COLONOSCOPY W/ POLYPECTOMY     FOREARM SURGERY     MASS REMOVED   HIP ARTHROSCOPY Left    LASIK Bilateral    MITRAL VALVE REPLACEMENT N/A 03/31/2023   Procedure: MITRAL VALVE (MV) REPLACEMENT UTILIZING SIZE 33 MOSAIC PORCINE HEART VALVE;  Surgeon: Maryjane Mt, MD;  Location: [facility] OR;  Service: Open Heart Surgery;  Laterality: N/A;   TEE WITHOUT CARDIOVERSION N/A 03/31/2023   Procedure: TRANSESOPHAGEAL ECHOCARDIOGRAM;  Surgeon: Maryjane Mt, MD;  Location: [facility] OR;  Service: Open Heart Surgery;  Laterality: N/A;   TONSILECTOMY, ADENOIDECTOMY, BILATERAL MYRINGOTOMY AND TUBES  2011   UMBILICAL HERNIA  REPAIR     VASECTOMY      ALLERGIES: No Known Allergies  FAMILY HISTORY: Family History  Problem Relation Age of Onset   CAD Mother    Kidney failure Mother    Heart attack Father    Heart attack Paternal Grandfather    Cancer Child        RARE BONE CANCER RESULTING IN TOTAL AMPUTATION OF LIMB    SOCIAL HISTORY: Social History   Tobacco Use   Smoking status: Never   Smokeless tobacco: Never  Vaping Use   Vaping status: Never Used  Substance Use Topics   Alcohol use: Never   Drug use: Never   Social History   Social History Narrative   Are you right handed or left handed? Left handed   Are you currently employed ?    What is your current occupation? disability   Do you live at  home alone? yes   Who lives with you?    What type of home do you live in: 1 story or 2 story? one    Caffiene 2 cups a day    Objective:  Vital Signs:  BP 120/76 (Cuff Size: Large)   Pulse 68   Ht 6' (1.829 m)   SpO2 98%   BMI 33.09 kg/m   General: General appearance: Awake and alert. No distress. Cooperative with exam.  Skin: No obvious rash or jaundice. HEENT: Atraumatic. Anicteric. Lungs: Non-labored breathing on room air  Heart: Regular Extremities: No obvious deformity.  Psych: Affect appropriate.  Neurological: Mental Status: Alert. Speech fluent. No pseudobulbar affect Cranial Nerves: CNII: No RAPD. Visual fields with LHH CNIII, IV, VI: PERRL. No nystagmus. EOMI. CN V: Facial sensation intact bilaterally to fine touch. CN VII: Facial muscles symmetric and strong. No ptosis at rest. CN VIII: Hears finger rub well bilaterally. CN IX: No hypophonia. CN X: Palate elevates symmetrically. CN XI: Full strength shoulder shrug bilaterally. CN XII: Tongue protrusion full and midline. No atrophy or fasciculations. No significant dysarthria Motor: Tone is normal.  Individual muscle group testing (MRC grade out of 5):  Movement     Neck flexion 5    Neck extension 5     Right Left   Shoulder abduction 5 5   Elbow flexion 5 5   Elbow extension 5 5   Finger abduction - FDI 5 5   Finger abduction - ADM 5 5   Finger extension 5 5   Finger distal flexion - 2/3 5 5    Finger distal flexion - 4/5 5 5    Thumb flexion - FPL 5 5   Thumb abduction - APB 5 5    Hip flexion 5 4+ Limited by pain on left  Hip extension 5 5   Hip adduction 5 5   Hip abduction 5 5   Knee extension 5 5   Knee flexion 5 5   Dorsiflexion 5 5   Plantarflexion 5 5    Reflexes:  Right Left   Bicep 2+ 2+   Tricep 2+ 2+   BrRad 2+ 2+   Knee 2+ 2+   Ankle 2+ 2+    Pathological Reflexes: Babinski: flexor response bilaterally Hoffman: absent bilaterally Sensation: Pinprick: Increased  sensitivity in left foot compared to right. Diminished in lateral aspect of right lower leg (patchy) Vibration: Diminished in right great toe Proprioception: Diminished in left great toe. Coordination: Intact finger-to- nose-finger bilaterally. Romberg with sway. Gait: Able to rise from chair with arms crossed unassisted.  Antalgic vs [place] gait.   Labs and Imaging review: Internal labs: 06/19/23: ESR mildly elevated at 22 CRP wnl  04/24/23: CBC significant for Hb 10.1 BMP significant for glucose of 148  HbA1c (03/29/23): 6.2 HIV non-reactive  Imaging: MRI brain wo contrast (03/29/23): FINDINGS: Brain: Confluent acute right PCA territory infarct in the right occipital lobe. Additional smaller acute infarcts in the right parietal lobe and bilateral cerebellum. A few punctate acute infarcts in the left parietal lobe. Associated edema without substantial mass effect. FLAIR hyperintensity along the right frontal lobe with susceptibility artifact, compatible with subarachnoid hemorrhage seen on same day CT head. No hydrocephalus or mass lesion. No midline shift.   Vascular: Major arterial flow voids are maintained skull base.   Skull and upper cervical spine: Normal marrow signal.   Sinuses/Orbits: Mostly clear sinuses.  No acute orbital findings.   Other: Right greater than left mastoid effusions.   IMPRESSION: 1. Confluent right PCA territory occipital infarct. 2. Additional smaller acute infarcts in bilateral parietal lobes and the cerebellum. 3. Small volume of acute subarachnoid hemorrhage along the right frontal convexity, likely similar when comparing across modalities to same day CT head. 4. Given reported infective endocarditis the above findings could be secondary to septic emboli. If the patient does not improve, recommend low threshold for repeat MRI with contrast.  MRA head wo contrast (03/30/23): FINDINGS: Anterior circulation: No aneurysm. No proximal  occlusion. No significant stenosis.   Posterior circulation: The P4 segment of the right PCA is occluded. Left PCA territory and bilateral vertebral arteries are normal in appearance.   Anatomic variants: None   Other: None.   IMPRESSION: Occlusion of the P4 segment of the right PCA, in keeping with the patient's history of confluence right PCA territory infarct.  Echocardiogram (05/12/23):  1. Left ventricular ejection fraction, by estimation, is 60 to 65%. The  left ventricle has normal function. The left ventricle has no regional  wall motion abnormalities. There is moderate concentric left ventricular  hypertrophy. Left ventricular  diastolic parameters were normal. The average left ventricular global  longitudinal strain is 11.4 %. The global longitudinal strain is abnormal.   2. Right ventricular systolic function is normal. The right ventricular  size is normal.   3. Left atrial size was mildly dilated.   4. 33 Mosaic porcinr MVR mean gradient 4 mm Hg. No evidence of mitral  valve regurgitation. There is a 33 mm Medtronic bioprosthetic valve  present in the mitral position. Procedure Date: 03/31/2023. Echo findings  are consistent with normal structure and  function of the mitral valve prosthesis.   5. Aortic valve regurgitation is not visualized. There is a 27 mm Edwards  Resilia valve present in the aortic position. Procedure Date: 03/31/2023.  Echo findings are consistent with normal structure and function of the  aortic valve prosthesis.   6. Root and ascending aorta replacement      . Aortic Normal arch and DTA and root/ascending aorta has been  replaced.   7. The inferior vena cava is normal in size with greater than 50%  respiratory variability, suggesting right atrial pressure of 3 mmHg.   Carotid ultrasound (03/29/23): Summary:  Right Carotid: Limited exam due to line placement, the extracranial  vessels that                 were visualized are near-normal with  only minimal wall  thickening  or plaque.   Left Carotid: The extracranial vessels were near-normal with only minimal  wall               thickening or plaque.   Vertebrals:  Bilateral vertebral arteries demonstrate antegrade flow.  Subclavians: Normal flow hemodynamics were seen in bilateral subclavian               arteries.    Assessment/Plan:  CHRIST FULLENWIDER is a 57 y.o. male who presents for evaluation of visual deficits, pain and numbness in feet, and cognitive changes. He has a relevant medical history of endocarditis (03/2023) c/b embolic stroke s/p AV repair, HTN, CAD, pre-DM, recurrent UTIs with pyelonephritis, depression, and anxiety. His neurological examination is pertinent for left homonomous hemianopia [facility]) and patchy sensory loss in his legs. He does not know his history or medications well, so cognitive deficit is also likely. Available diagnostic data is significant for MRI brain with large right PCA infarct and smaller infarcts in bilateral parietal lobes and cerebellum. MRA showed a right P4 occlusion. Stroke was the result of septic emboli from endocarditis. This well explains his [facility]. His cognitive changes could be due to recent infection, hospitalization, and stroke, but cognitive deficits would not be expected by stroke seen on MRI. Similarly, the patchy sensory loss in the legs is not well explained. It is not likely from stroke. It is also not very symmetric, as would be expected from a peripheral neuropathy. I offered EMG to better evaluate, but patient deferred. I will get labs to look for treatable causes of his symptoms and continue secondary stroke prevention.   PLAN: -Blood work: B1, B12, folate, IFE, vit D, TSH -Discussed EMG, patient deferred today -Continue asa 81 mg daily, warfarin, lipitor  80 mg daily, and zetia  10 mg daily for secondary stroke prevention -Continue PT. Continue home speech therapy.  -Return to clinic in 3-4 months  The  impression above as well as the plan as outlined below were extensively discussed with the patient (in the company of daughter) who voiced understanding. All questions were answered to their satisfaction.  The patient was counseled on pertinent fall precautions per the printed material provided today, and as noted under the Patient Instructions section below.  When available, results of the above investigations and possible further recommendations will be communicated to the patient via telephone/MyChart. Patient to call office if not contacted after expected testing turnaround time.   Total time spent reviewing records, interview, history/exam, documentation, and coordination of care on day of encounter:  70 min   Thank you for allowing me to participate in patient's care.  If I can answer any additional questions, I would be pleased to do so.  Venetia Potters, MD   CC: Silvano Angeline FALCON, NP [address] [place] [address]  CC: Referring provider: Luiz Channel, MD [address] [place],  [place] [address]

## 2023-07-06 ENCOUNTER — Ambulatory Visit: Payer: MEDICAID | Admitting: Podiatry

## 2023-07-06 ENCOUNTER — Ambulatory Visit: Payer: MEDICAID

## 2023-07-06 DIAGNOSIS — M778 Other enthesopathies, not elsewhere classified: Secondary | ICD-10-CM

## 2023-07-06 DIAGNOSIS — S90212A Contusion of left great toe with damage to nail, initial encounter: Secondary | ICD-10-CM

## 2023-07-06 DIAGNOSIS — M7752 Other enthesopathy of left foot: Secondary | ICD-10-CM

## 2023-07-06 DIAGNOSIS — G6289 Other specified polyneuropathies: Secondary | ICD-10-CM | POA: Diagnosis not present

## 2023-07-06 NOTE — Progress Notes (Signed)
Chief Complaint  Patient presents with   Toe Pain    LFT toe pain, pt fell at work in Sept.  Unsure of what he hit it on. [place]mail is discolored. He does have pain in the toe.  [place]   HPI: 57 y.o. male presents today after suffering from a fall over the summer at work and he had a stroke.  Notes he must have struck the left great toenail at the time of the fall.  The nail became black.  States a lot of dried blood came out from underneath the nail recently and he became concerned.  He states it looks much better today.  While he was hospitalized over the summer notes his blood sugar was extremely elevated and he had to have insulin to control this.  He noted by the time of discharge his blood sugar had been normalized.  He is concerned about sensitivity to the toes.  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   Enterococcal bacteremia 03/29/2023   Essential hypertension 02/13/2020   Iron deficiency anemia 03/20/2023   Long term (current) use of anticoagulants 05/02/2023   Mitral valve replaced 05/02/2023   Pericardial effusion after operative procedure 04/14/2023   Precordial chest pain 02/13/2020   Protein-calorie malnutrition, severe 03/30/2023   S/P aortic valve replacement and aortoplasty 03/31/2023   Septic shock (HCC) 03/29/2023   Shortness of breath 04/14/2023    Past Surgical  History:  Procedure Laterality Date   ASCENDING AORTIC ROOT REPLACEMENT N/A 03/31/2023   Procedure: ASCENDING AORTIC ROOT REPLACEMENT UTILIZING 27 KONECT RESILIA  AORTIC VALVE CONDUIT;  Surgeon: Eugenio Hoes, MD;  Location: [facility] OR;  Service: Open Heart Surgery;  Laterality: N/A;   COLONOSCOPY W/ POLYPECTOMY     FOREARM SURGERY     MASS REMOVED   HIP ARTHROSCOPY Left    LASIK Bilateral    MITRAL VALVE REPLACEMENT N/A 03/31/2023   Procedure: MITRAL VALVE (MV) REPLACEMENT UTILIZING SIZE 33 MOSAIC PORCINE HEART VALVE;  Surgeon: Eugenio Hoes, MD;  Location: [facility] OR;  Service: Open Heart Surgery;  Laterality: N/A;   TEE WITHOUT CARDIOVERSION N/A 03/31/2023   Procedure: TRANSESOPHAGEAL ECHOCARDIOGRAM;  Surgeon: Eugenio Hoes, MD;  Location: [facility] OR;  Service: Open Heart Surgery;  Laterality: N/A;   TONSILECTOMY, ADENOIDECTOMY, BILATERAL MYRINGOTOMY AND TUBES  2011   UMBILICAL HERNIA REPAIR     VASECTOMY     No Known Allergies  Physical Exam: General: The patient is alert and oriented x3 in no acute distress.  Dermatology: Skin is warm, dry and supple bilateral lower extremities. Interspaces are clear of maceration and debris.  The left hallux nail, along the distal 50%, has distal onycholysis, evidence of old subungual bleeding which has mostly been removed/falling out from underneath the nail, yellow discoloration and pain with compression.  The proximal 50% of the nail is clear and well adhered to the nailbed.  Vascular:  Palpable pedal pulses bilaterally. Capillary refill within normal limits.  No appreciable edema.  No erythema or calor.  Neurological: Light touch sensation hyperreflexive bilateral.  Light touch is very uncomfortable and almost painful to the patient throughout the toes and bilateral forefoot.  Protective sensation is intact bilateral.  Sensitivity when pressing on all of the toenails.  Musculoskeletal Exam: No pain on palpation to the left hallux other than the  toenail.  Radiographic Exam (left foot, 3 weightbearing views, 07/06/2023):  Normal osseous mineralization.  Short first metatarsal.  Medial angulation of the fifth toe at the MPJ level.  Joint space narrowing at the inferior aspect of the talonavicular joint.  Degenerative joint changes at the navicular-medial cuneiform joint.  No fracture seen  Assessment/Plan of Care: 1. Contusion of left great toe with damage to nail, initial encounter   2. Capsulitis of foot, left   3. Other polyneuropathy    Discussed clinical and radiographic findings with patient today.  The left hallux nail was gently trimmed back.  No further treatment is needed.  This nail should continue to grow out and just needs to be trimmed regularly.  It does appear that they are will be a healthy nail continuing to grow and.  If there are any continued issues within the next few months we can follow-up again at his request.  Informed the patient that he does have evidence of neuropathy however he is almost hypersensitive at this time which I commonly see more so after a stroke rather than with early onset diabetic neuropathy.  Patient has not been diagnosed with any diabetes at this time.  The patient feels he has all the criteria for diabetes however.    Patient is still getting at home physical therapy after his hospital admission.  Instructed patient to discuss TENS stimulation to the nerves of the forefoot to see if this will calm down the hypersensitivity with light touch.  Recommended he do this twice a day for 30 days.  If this does not have any improvement, asked patient to call the office and we could start him on a low-dose of gabapentin twice daily to see how he improves with this.   Clerance Lav, DPM, FACFAS [facility]     [address] [place], [place] [address]                Office [phone]  Fax [phone]

## 2023-07-12 ENCOUNTER — Encounter: Payer: Self-pay | Admitting: Internal Medicine

## 2023-07-13 ENCOUNTER — Other Ambulatory Visit: Payer: MEDICAID

## 2023-07-13 ENCOUNTER — Encounter: Payer: Self-pay | Admitting: Neurology

## 2023-07-13 ENCOUNTER — Ambulatory Visit: Payer: MEDICAID | Admitting: Neurology

## 2023-07-13 VITALS — BP 120/76 | HR 68 | Ht 72.0 in | Wt 247.0 lb

## 2023-07-13 DIAGNOSIS — R209 Unspecified disturbances of skin sensation: Secondary | ICD-10-CM | POA: Diagnosis not present

## 2023-07-13 DIAGNOSIS — R4189 Other symptoms and signs involving cognitive functions and awareness: Secondary | ICD-10-CM

## 2023-07-13 DIAGNOSIS — R7303 Prediabetes: Secondary | ICD-10-CM | POA: Diagnosis not present

## 2023-07-13 DIAGNOSIS — I38 Endocarditis, valve unspecified: Secondary | ICD-10-CM

## 2023-07-13 DIAGNOSIS — I63532 Cerebral infarction due to unspecified occlusion or stenosis of left posterior cerebral artery: Secondary | ICD-10-CM | POA: Diagnosis not present

## 2023-07-13 MED ORDER — AMOXICILLIN 500 MG PO CAPS: 1000.0000 mg | ORAL_CAPSULE | Freq: Three times a day (TID) | ORAL | 0 refills | Status: DC

## 2023-07-13 NOTE — Patient Instructions (Addendum)
 I saw you today after your stroke in 03/2023. You are having visual problems and cognitive/processing changes since.  I want to get lab work today. I will be in touch when I have those results.  Continue aspirin  81 mg, warfarin, lipitor  80 mg, and zetia  10 mg daily for stroke prevention.  We discussed nerve testing with EMG, but you wanted to hold off on this for now.  I will see you back in 3-4 months. Continue your therapy and home exercises and I will re-evaluate at that time.  Please let me know if you have any questions or concerns in the meantime.   The physicians and staff at [facility] are committed to providing excellent care. You may receive a survey requesting feedback about your experience at our office. We strive to receive very good responses to the survey questions. If you feel that your experience would prevent you from giving the office a very good  response, please contact our office to try to remedy the situation. We may be reached at [phone]. Thank you for taking the time out of your busy day to complete the survey.  Venetia Potters, MD [facility]  Preventing Falls at [facility] are common, often dreaded events in the lives of older people. Aside from the obvious injuries and even death that may result, fall can cause wide-ranging consequences including loss of independence, mental decline, decreased activity and mobility. Younger people are also at risk of falling, especially those with chronic illnesses and fatigue.  Ways to reduce risk for falling Examine diet and medications. Warm foods and alcohol dilate blood vessels, which can lead to dizziness when standing. Sleep aids, antidepressants and pain medications can also increase the likelihood of a fall.  Get a vision exam. Poor vision, cataracts and glaucoma increase the chances of falling.  Check foot gear. Shoes should fit snugly and have a sturdy, nonskid sole and a broad, low  heel  Participate in a physician-approved exercise program to build and maintain muscle strength and improve balance and coordination. Programs that use ankle weights or stretch bands are excellent for muscle-strengthening. Water  aerobics programs and low-impact Tai Chi programs have also been shown to improve balance and coordination.  Increase vitamin D  intake. Vitamin D  improves muscle strength and increases the amount of calcium  the body is able to absorb and deposit in bones.  How to prevent falls from common hazards Floors - Remove all loose wires, cords, and throw rugs. Minimize clutter. Make sure rugs are anchored and smooth. Keep furniture in its usual place.  Chairs -- Use chairs with straight backs, armrests and firm seats. Add firm cushions to existing pieces to add height.  Bathroom - Install grab bars and non-skid tape in the tub or shower. Use a bathtub transfer bench or a shower chair with a back support Use an elevated toilet seat and/or safety rails to assist standing from a low surface. Do not use towel racks or bathroom tissue holders to help you stand.  Lighting - Make sure halls, stairways, and entrances are well-lit. Install a night light in your bathroom or hallway. Make sure there is a light switch at the top and bottom of the staircase. Turn lights on if you get up in the middle of the night. Make sure lamps or light switches are within reach of the bed if you have to get up during the night.  Kitchen - Install non-skid rubber mats near the sink and stove. Clean spills immediately.  Store frequently used utensils, pots, pans between waist and eye level. This helps prevent reaching and bending. Sit when getting things out of lower cupboards.  Living room/ Bedrooms - Place furniture with wide spaces in between, giving enough room to move around. Establish a route through the living room that gives you something to hold onto as you walk.  Stairs - Make sure treads, rails, and  rugs are secure. Install a rail on both sides of the stairs. If stairs are a threat, it might be helpful to arrange most of your activities on the lower level to reduce the number of times you must climb the stairs.  Entrances and doorways - Install metal handles on the walls adjacent to the doorknobs of all doors to make it more secure as you travel through the doorway.  Tips for maintaining balance Keep at least one hand free at all times. Try using a backpack or fanny pack to hold things rather than carrying them in your hands. Never carry objects in both hands when walking as this interferes with keeping your balance.  Attempt to swing both arms from front to back while walking. This might require a conscious effort if Parkinson's disease has diminished your movement. It will, however, help you to maintain balance and posture, and reduce fatigue.  Consciously lift your feet off of the ground when walking. Shuffling and dragging of the feet is a common culprit in losing your balance.  When trying to navigate turns, use a U technique of facing forward and making a wide turn, rather than pivoting sharply.  Try to stand with your feet shoulder-length apart. When your feet are close together for any length of time, you increase your risk of losing your balance and falling.  Do one thing at a time. Don't try to walk and accomplish another task, such as reading or looking around. The decrease in your automatic reflexes complicates motor function, so the less distraction, the better.  Do not wear rubber or gripping soled shoes, they might catch on the floor and cause tripping.  Move slowly when changing positions. Use deliberate, concentrated movements and, if needed, use a grab bar or walking aid. Count 15 seconds between each movement. For example, when rising from a seated position, wait 15 seconds after standing to begin walking.  If balance is a continuous problem, you might want to  consider a walking aid such as a cane, walking stick, or walker. Once you've mastered walking with help, you might be ready to try it on your own again.

## 2023-07-17 LAB — FOLATE: Folate: >24 ng/mL

## 2023-07-17 LAB — VITAMIN D 25 HYDROXY (VIT D DEFICIENCY, FRACTURES): Vit D, 25-Hydroxy: 35 ng/mL (ref 30–100)

## 2023-07-17 LAB — IMMUNOFIXATION ELECTROPHORESIS
IgG (Immunoglobin G), Serum: 940 mg/dL (ref 600–1640)
IgM, Serum: 68 mg/dL (ref 50–300)
Immunoglobulin A: 221 mg/dL (ref 47–310)

## 2023-07-17 LAB — VITAMIN B12: Vitamin B-12: 518 pg/mL (ref 200–1100)

## 2023-07-17 LAB — VITAMIN B1: Vitamin B1 (Thiamine): 24 nmol/L (ref 8–30)

## 2023-07-17 LAB — TSH: TSH: 2.37 m[IU]/L (ref 0.40–4.50)

## 2023-07-18 ENCOUNTER — Ambulatory Visit: Payer: MEDICAID | Attending: Cardiology

## 2023-07-18 DIAGNOSIS — I4891 Unspecified atrial fibrillation: Secondary | ICD-10-CM | POA: Diagnosis not present

## 2023-07-18 DIAGNOSIS — Z952 Presence of prosthetic heart valve: Secondary | ICD-10-CM

## 2023-07-18 LAB — POCT INR: INR: 1.7 — AB (ref 2.0–3.0)

## 2023-07-18 NOTE — Patient Instructions (Signed)
 Description   Take 1.5 tablets today and then START taking warfarin 1 tablet (4mg ) daily EXCEPT 1.5 tablets on Sundays.  Remain consistent with greens each week (only eats greens occasionally).  Recheck INR in 2 weeks.  Coumadin  Clinic [phone] Amio 200mg  daily d/c 05/08/23 [id number]

## 2023-07-27 ENCOUNTER — Encounter: Payer: Self-pay | Admitting: Internal Medicine

## 2023-07-27 ENCOUNTER — Other Ambulatory Visit: Payer: Self-pay

## 2023-07-27 ENCOUNTER — Ambulatory Visit: Payer: MEDICAID | Admitting: Internal Medicine

## 2023-07-27 VITALS — BP 109/74 | HR 97 | Temp 98.4°F | Wt 249.0 lb

## 2023-07-27 DIAGNOSIS — B952 Enterococcus as the cause of diseases classified elsewhere: Secondary | ICD-10-CM

## 2023-07-27 DIAGNOSIS — Z96642 Presence of left artificial hip joint: Secondary | ICD-10-CM | POA: Diagnosis not present

## 2023-07-27 DIAGNOSIS — M25552 Pain in left hip: Secondary | ICD-10-CM

## 2023-07-27 DIAGNOSIS — R7881 Bacteremia: Secondary | ICD-10-CM | POA: Diagnosis not present

## 2023-07-27 NOTE — Progress Notes (Signed)
Patient ID: Dale Mills, male   DOB: 1965/09/12, 58 y.o.   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 On coumadin.Tissue cx + on 9/20. But blood cx negative on 9/18. He still complains of left hipl pain. MRI during hospitalization didn't show any clear evidence of infection. He has tolerated abtx. He continues to get pt/ot. Still has visual deficits from embolic stroke  continues on amoxicillin.Field cut on left eye. Saw aphtho. Unlikely to drive  The patient, with a history of left hip replacement in April of the previous year, continues to experience persistent pain in the area. The discomfort is localized to the left hip, particularly on the side, and is not exacerbated by walking or leg movement. Instead, the patient reports that the pain is more noticeable when sitting or leaning. Despite the ongoing discomfort, the patient has not reported any associated symptoms such as fevers or chills.  In addition to the hip issue, the patient has a history of heart disease and has been managing fluid accumulation in the lungs with Lasix. The patient has been regularly attending follow-up appointments for warfarin checks and has been discharged from regular chest x-rays after the fluid issue was resolved. The patient is scheduled to see a cardiologist in the near future.  The patient's hip pain has not improved despite the passage of time since the surgery, and there is a noticeable difference in the feel of the prosthesis compared to the other hip. The patient has not had any recent follow-ups with the surgeon who performed the hip replacement.  Lab Results  Component Value Date   ESRSEDRATE 22 (H) 06/19/2023    Lab Results  Component Value Date   CRP <3.0 06/19/2023     Outpatient Encounter Medications as of 07/27/2023  Medication Sig   amoxicillin (AMOXIL) 500  MG capsule Take 2 capsules (1,000 mg total) by mouth 3 (three) times daily.   ascorbic acid (VITAMIN C) 500 MG tablet Take 1 tablet (500 mg total) by mouth daily.   aspirin EC 81 MG tablet Take 1 tablet (81 mg total) by mouth daily. Swallow whole.   atorvastatin (LIPITOR) 80 MG tablet Take 1 tablet (80 mg total) by mouth daily.   ezetimibe (ZETIA) 10 MG tablet Take 1 tablet (10 mg total) by mouth daily.   Fe Fum-Vit C-Vit B12-FA (TRIGELS-F FORTE) CAPS capsule Take 1 capsule by mouth daily after breakfast.   gabapentin (NEURONTIN) 100 MG capsule Take 100 mg by mouth 3 (three) times daily.   metoprolol tartrate (LOPRESSOR) 25 MG tablet Take 0.5 tablets (12.5 mg total) by mouth 2 (two) times daily.   Mouthwashes (MOUTH RINSE) LIQD solution 15 mLs by Mouth Rinse route 2 (two) times daily.   pantoprazole (PROTONIX) 40 MG tablet Take 1 tablet (40 mg total) by mouth daily.   potassium chloride (KLOR-CON M) 10 MEQ tablet Take 1 tablet (10 mEq total) by mouth daily.   tamsulosin (FLOMAX) 0.4 MG CAPS capsule Take 1 capsule (0.4 mg total) by mouth daily after supper.   torsemide (DEMADEX) 20 MG tablet Take 1 tablet (20 mg total) by mouth daily.   traZODone (DESYREL) 50 MG tablet Take 1 tablet (50 mg total) by mouth at bedtime.   warfarin (COUMADIN) 4 MG tablet Take 1 tablet (4 mg total) by mouth daily at 4 PM.   No facility-administered encounter medications on file as  of 07/27/2023.     Patient Active Problem List   Diagnosis Date Noted   Anxiety    Arthritis    Depression    DJD (degenerative joint disease)    Long term (current) use of anticoagulants 05/02/2023   Atrial fibrillation (HCC) 05/02/2023   Mitral valve replaced 05/02/2023   DVT (deep venous thrombosis) (HCC) 05/02/2023   Pericardial effusion after operative procedure 04/14/2023   Shortness of breath 04/14/2023   Bilateral lower extremity edema 04/14/2023   Debility 04/10/2023   Acute ischemic left posterior cerebral artery (PCA)  stroke (HCC) 04/09/2023   S/P aortic valve replacement and aortoplasty 03/31/2023   Protein-calorie malnutrition, severe 03/30/2023   Acute respiratory failure (HCC) 03/29/2023   Septic shock (HCC) 03/29/2023   Enterococcal bacteremia 03/29/2023   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   Iron deficiency anemia 03/20/2023   Coronary artery disease coronary CT angio did not show any major issues, suspicion for distal disease 06/12/2020   Angina pectoris (HCC) Canadian classification 2 04/10/2020   Precordial chest pain 02/13/2020   Essential hypertension 02/13/2020   Dyslipidemia 02/13/2020     Health Maintenance Due  Topic Date Due   Pneumococcal Vaccine 64-16 Years old (1 of 2 - PCV) Never done   Hepatitis C Screening  Never done   DTaP/Tdap/Td (1 - Tdap) Never done   Zoster Vaccines- Shingrix (1 of 2) Never done   Colonoscopy  Never done   COVID-19 Vaccine (2 - Pfizer risk series) 05/03/2023     Review of Systems 12 point ros is negative except what is mentioned above Physical Exam   BP 109/74   Pulse 97   Temp 98.4 F (36.9 C) (Oral)   Wt 249 lb (112.9 kg)   SpO2 97%   BMI 33.77 kg/m    Physical Exam  Constitutional: He is oriented to person, place, and time. He appears well-developed and well-nourished. No distress.  HENT:  Mouth/Throat: Oropharynx is clear and moist. No oropharyngeal exudate.  Cardiovascular: Normal rate, regular rhythm and normal heart sounds. Exam reveals no gallop and no friction rub.  No murmur heard.  Pulmonary/Chest: Effort normal and breath sounds normal. No respiratory distress. He has no wheezes.  Abdominal: Soft. Bowel sounds are normal. He exhibits no distension. There is no tenderness.  Lymphadenopathy:  He has no cervical adenopathy.  Neurological: He is alert and oriented to person, place, and time.  Skin: Skin is warm and dry. No rash noted. No erythema.  Psychiatric: He has a normal mood and affect. His behavior is normal.     CBC Lab Results  Component Value Date   WBC 8.5 04/24/2023   RBC 3.84 (L) 04/24/2023   HGB 10.1 (L) 04/24/2023   HCT 33.2 (L) 04/24/2023   PLT 353 04/24/2023   MCV 86.5 04/24/2023   MCH 26.3 04/24/2023   MCHC 30.4 04/24/2023   RDW 16.3 (H) 04/24/2023    BMET Lab Results  Component Value Date   NA 136 04/24/2023   K 3.6 04/24/2023   CL 101 04/24/2023   CO2 26 04/24/2023   GLUCOSE 148 (H) 04/24/2023   BUN 19 04/24/2023   CREATININE 0.89 04/24/2023   CALCIUM 9.3 04/24/2023   GFRNONAA >60 04/24/2023   Lab Results  Component Value Date   ESRSEDRATE 2 07/27/2023   Lab Results  Component Value Date   CRP <3.0 07/27/2023   Lab Results  Component Value Date   WBC 6.6 07/27/2023   HGB 14.1 07/27/2023  HCT 44.7 07/27/2023   MCV 81.6 07/27/2023   PLT 226 07/27/2023      Assessment and Plan  History of enterococcal mv endocarditis s/p valve replacement =  has finished course of treatment and currently on amoxicillin- that was extended up to 6 month due to concern for possible pji  Postoperative Hip Pain Persistent left hip pain following hip replacement surgery in April last year. Pain is localized, exacerbated by sitting and leaning, but not significantly affected by walking. No fevers or chills. Differential diagnosis includes infection or mechanical issues with the prosthesis. MRI needed to evaluate. - Order MRI of the left hip - Continue amoxicillin - Perform lab work to check for signs of infection - Discuss findings with Dr. Loralie Champagne if necessary  Heart Failure with Fluid Overload Fluid accumulation in the lungs managed with Lasix and regular follow-ups. Last follow-up with heart surgeon indicated to call if needed. Regular INR checks for warfarin management every Tuesday. - Continue current dosage of Lasix - Follow up with cardiologist Dr. Bing Matter on March 19th - Regular INR checks for warfarin management every Tuesday.

## 2023-07-28 LAB — BASIC METABOLIC PANEL
BUN: 12 mg/dL (ref 7–25)
CO2: 30 mmol/L (ref 20–32)
Calcium: 9.8 mg/dL (ref 8.6–10.3)
Chloride: 103 mmol/L (ref 98–110)
Creat: 0.93 mg/dL (ref 0.70–1.30)
Glucose, Bld: 78 mg/dL (ref 65–99)
Potassium: 4.3 mmol/L (ref 3.5–5.3)
Sodium: 140 mmol/L (ref 135–146)

## 2023-07-28 LAB — CBC WITH DIFFERENTIAL/PLATELET
Absolute Lymphocytes: 2224 {cells}/uL (ref 850–3900)
Absolute Monocytes: 581 {cells}/uL (ref 200–950)
Basophils Absolute: 20 {cells}/uL (ref 0–200)
Basophils Relative: 0.3 %
Eosinophils Absolute: 73 {cells}/uL (ref 15–500)
Eosinophils Relative: 1.1 %
HCT: 44.7 % (ref 38.5–50.0)
Hemoglobin: 14.1 g/dL (ref 13.2–17.1)
MCH: 25.7 pg — ABNORMAL LOW (ref 27.0–33.0)
MCHC: 31.5 g/dL — ABNORMAL LOW (ref 32.0–36.0)
MCV: 81.6 fL (ref 80.0–100.0)
MPV: 9.4 fL (ref 7.5–12.5)
Monocytes Relative: 8.8 %
Neutro Abs: 3703 {cells}/uL (ref 1500–7800)
Neutrophils Relative %: 56.1 %
Platelets: 226 10*3/uL (ref 140–400)
RBC: 5.48 10*6/uL (ref 4.20–5.80)
RDW: 15.6 % — ABNORMAL HIGH (ref 11.0–15.0)
Total Lymphocyte: 33.7 %
WBC: 6.6 10*3/uL (ref 3.8–10.8)

## 2023-07-28 LAB — SEDIMENTATION RATE: Sed Rate: 2 mm/h (ref 0–20)

## 2023-07-28 LAB — C-REACTIVE PROTEIN: CRP: <3 mg/L (ref <8.0)

## 2023-08-01 ENCOUNTER — Ambulatory Visit: Payer: MEDICAID | Attending: Cardiology

## 2023-08-01 DIAGNOSIS — Z952 Presence of prosthetic heart valve: Secondary | ICD-10-CM

## 2023-08-01 DIAGNOSIS — I4891 Unspecified atrial fibrillation: Secondary | ICD-10-CM | POA: Diagnosis not present

## 2023-08-01 LAB — POCT INR: INR: 2 (ref 2.0–3.0)

## 2023-08-01 NOTE — Patient Instructions (Signed)
Description   Take 1.5 tablets today and then continue taking warfarin 1 tablet (4mg ) daily EXCEPT 1.5 tablets on Sundays.  Remain consistent with greens each week (only eats greens occasionally).  Recheck INR in 3 weeks.  Coumadin Clinic [phone] Amio 200mg  daily d/c 05/08/23 [id number]

## 2023-08-08 MED ORDER — AMOXICILLIN 500 MG PO CAPS: 1000.0000 mg | ORAL_CAPSULE | Freq: Three times a day (TID) | ORAL | 1 refills | Status: AC

## 2023-08-08 NOTE — Telephone Encounter (Signed)
Confirmed with Dr. Drue Second that patient should be on amoxicillin x 6 months from start date of 05/13/23.  Refills sent to get patient to next appointment.   Sandie Ano, RN

## 2023-08-08 NOTE — Addendum Note (Signed)
Addended by: Linna Hoff D on: 08/08/2023 11:39 AM   Modules accepted: Orders

## 2023-08-09 ENCOUNTER — Ambulatory Visit (HOSPITAL_COMMUNITY)
Admission: RE | Admit: 2023-08-09 | Discharge: 2023-08-09 | Disposition: A | Discharge status: Home / Self Care | Payer: MEDICAID | Source: Ambulatory Visit | Attending: Internal Medicine | Admitting: Internal Medicine

## 2023-08-09 DIAGNOSIS — Z96642 Presence of left artificial hip joint: Secondary | ICD-10-CM | POA: Insufficient documentation

## 2023-08-09 DIAGNOSIS — M25552 Pain in left hip: Secondary | ICD-10-CM | POA: Insufficient documentation

## 2023-08-09 MED ORDER — GADOBUTROL 1 MMOL/ML IV SOLN
10.0000 mL | Freq: Once | INTRAVENOUS | Status: AC | PRN
  Administered 2023-08-09: 10 mL via INTRAVENOUS

## 2023-08-09 MED ORDER — GADOBUTROL 1 MMOL/ML IV SOLN: 10.0000 mL | Freq: Once | INTRAVENOUS | Status: DC | PRN

## 2023-08-18 ENCOUNTER — Telehealth: Payer: Self-pay | Admitting: Cardiology

## 2023-08-18 DIAGNOSIS — Z952 Presence of prosthetic heart valve: Secondary | ICD-10-CM

## 2023-08-18 NOTE — Telephone Encounter (Signed)
*  STAT* If patient is at the pharmacy, call can be transferred to refill team.   1. Which medications need to be refilled? (please list name of each medication and dose if known)   warfarin (COUMADIN ) 4 MG tablet   2. Would you like to learn more about the convenience, safety, & potential cost savings by using the [facility]?   3. Are you open to using the [facility] (Type [facility]. ).  4. Which pharmacy/location (including street and city if local pharmacy) is medication to be sent to?  [facility] - [place], [place] - [address]   5. Do they need a 30 day or 90 day supply?   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  Patient has appointment scheduled on 3/19.

## 2023-08-21 MED ORDER — WARFARIN SODIUM 4 MG PO TABS: ORAL_TABLET | ORAL | 0 refills | Status: DC

## 2023-08-21 NOTE — Telephone Encounter (Signed)
 Prescription refill request received for warfarin Lov: 06/12/23 Marjory Signs)  Next INR check: 08/22/23 Warfarin tablet strength: 4mg   Appropriate dose. Refill sent.

## 2023-08-22 ENCOUNTER — Ambulatory Visit: Payer: MEDICAID | Attending: Cardiology

## 2023-08-22 DIAGNOSIS — I4891 Unspecified atrial fibrillation: Secondary | ICD-10-CM

## 2023-08-22 DIAGNOSIS — Z952 Presence of prosthetic heart valve: Secondary | ICD-10-CM | POA: Diagnosis not present

## 2023-08-22 LAB — POCT INR: INR: 1.7 — AB (ref 2.0–3.0)

## 2023-08-22 NOTE — Patient Instructions (Signed)
Description   START taking warfarin 1 tablet (4mg ) daily EXCEPT 1.5 tablets on Sundays and Tuesdays.  Remain consistent with greens each week (only eats greens occasionally).  Recheck INR in 2 weeks.  Coumadin Clinic [phone] Amio 200mg  daily d/c 05/08/23 [id number]

## 2023-09-05 ENCOUNTER — Ambulatory Visit: Payer: MEDICAID | Attending: Cardiology

## 2023-09-05 DIAGNOSIS — Z952 Presence of prosthetic heart valve: Secondary | ICD-10-CM | POA: Diagnosis not present

## 2023-09-05 DIAGNOSIS — I4891 Unspecified atrial fibrillation: Secondary | ICD-10-CM | POA: Diagnosis not present

## 2023-09-05 LAB — POCT INR: INR: 2.1 (ref 2.0–3.0)

## 2023-09-05 NOTE — Patient Instructions (Signed)
 Description   Continue taking warfarin 1 tablet (4mg ) daily EXCEPT 1.5 tablets on Sundays and Tuesdays.  Remain consistent with greens each week (only eats greens occasionally).  Recheck INR in 3 weeks.  Coumadin Clinic [phone] Amio 200mg  daily d/c 05/08/23 [id number]

## 2023-09-14 ENCOUNTER — Ambulatory Visit: Payer: MEDICAID | Admitting: Internal Medicine

## 2023-09-14 ENCOUNTER — Encounter: Payer: Self-pay | Admitting: Internal Medicine

## 2023-09-14 ENCOUNTER — Other Ambulatory Visit: Payer: Self-pay

## 2023-09-14 VITALS — BP 115/73 | HR 68 | Temp 97.7°F | Ht 72.0 in | Wt 260.0 lb

## 2023-09-14 DIAGNOSIS — Z96642 Presence of left artificial hip joint: Secondary | ICD-10-CM | POA: Diagnosis not present

## 2023-09-14 DIAGNOSIS — I38 Endocarditis, valve unspecified: Secondary | ICD-10-CM

## 2023-09-14 DIAGNOSIS — B952 Enterococcus as the cause of diseases classified elsewhere: Secondary | ICD-10-CM

## 2023-09-14 DIAGNOSIS — I6349 Cerebral infarction due to embolism of other cerebral artery: Secondary | ICD-10-CM

## 2023-09-14 NOTE — Progress Notes (Signed)
 Patient ID: Dale Mills, male   DOB: [birth year], 58 y.o.   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 On coumadin.Tissue cx + on 9/20. But blood cx negative on 9/18. He still complains of left hipl pain. MRI during hospitalization didn't show any clear evidence of infection. He has tolerated abtx. He continues to get pt/ot. Still has visual deficits from embolic stroke  continues on amoxicillin.Field cut on left eye.  Just refilled his abtx. Yesterday. He reports that he is doing overall okay. He is Awaiting for disability.vision is unchanged  Outpatient Encounter Medications as of 09/14/2023  Medication Sig   amoxicillin (AMOXIL) 500 MG capsule Take 2 capsules (1,000 mg total) by mouth 3 (three) times daily.   ascorbic acid (VITAMIN C) 500 MG tablet Take 1 tablet (500 mg total) by mouth daily.   aspirin EC 81 MG tablet Take 1 tablet (81 mg total) by mouth daily. Swallow whole.   atorvastatin (LIPITOR) 80 MG tablet Take 1 tablet (80 mg total) by mouth daily.   ezetimibe (ZETIA) 10 MG tablet Take 1 tablet (10 mg total) by mouth daily.   Fe Fum-Vit C-Vit B12-FA (TRIGELS-F FORTE) CAPS capsule Take 1 capsule by mouth daily after breakfast.   gabapentin (NEURONTIN) 100 MG capsule Take 100 mg by mouth 3 (three) times daily.   metoprolol tartrate (LOPRESSOR) 25 MG tablet Take 0.5 tablets (12.5 mg total) by mouth 2 (two) times daily.   Mouthwashes (MOUTH RINSE) LIQD solution 15 mLs by Mouth Rinse route 2 (two) times daily.   pantoprazole (PROTONIX) 40 MG tablet Take 1 tablet (40 mg total) by mouth daily.   potassium chloride (KLOR-CON M) 10 MEQ tablet Take 1 tablet (10 mEq total) by mouth daily.   tamsulosin (FLOMAX) 0.4 MG CAPS capsule Take 1 capsule (0.4 mg total) by mouth daily after supper.   torsemide (DEMADEX) 20 MG tablet Take 1 tablet (20 mg total) by  mouth daily.   traZODone (DESYREL) 50 MG tablet Take 1 tablet (50 mg total) by mouth at bedtime.   warfarin (COUMADIN) 4 MG tablet TAKE 1 TABLET TO 1 1/2 TABLETS BY MOUTH DAILY AS DIRECTED BY COUMADIN CLINIC   No facility-administered encounter medications on file as of 09/14/2023.     Patient Active Problem List   Diagnosis Date Noted   Anxiety    Arthritis    Depression    DJD (degenerative joint disease)    Long term (current) use of anticoagulants 05/02/2023   Atrial fibrillation (HCC) 05/02/2023   Mitral valve replaced 05/02/2023   DVT (deep venous thrombosis) (HCC) 05/02/2023   Pericardial effusion after operative procedure 04/14/2023   Shortness of breath 04/14/2023   Bilateral lower extremity edema 04/14/2023   Debility 04/10/2023   Acute ischemic left posterior cerebral artery (PCA) stroke (HCC) 04/09/2023   S/P aortic valve replacement and aortoplasty 03/31/2023   Protein-calorie malnutrition, severe 03/30/2023   Acute respiratory failure (HCC) 03/29/2023   Septic shock (HCC) 03/29/2023   Enterococcal bacteremia 03/29/2023   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   Iron deficiency anemia 03/20/2023   Coronary artery disease coronary CT angio did not show any major issues, suspicion for distal disease 06/12/2020   Angina pectoris (HCC) Canadian classification 2 04/10/2020   Precordial chest pain 02/13/2020   Essential hypertension 02/13/2020   Dyslipidemia 02/13/2020     Health Maintenance Due  Topic Date Due   Pneumococcal Vaccine 58-56 Years old (1 of 2 - PCV) Never done   Hepatitis C Screening  Never done   DTaP/Tdap/Td (1 - Tdap) Never done   Zoster Vaccines- Shingrix (1 of 2) Never done   Colonoscopy  Never done   COVID-19 Vaccine (2 - Pfizer risk series) 05/03/2023     Review of Systems Has ongoing blurry vision with field cuts from his stroke Physical Exam   BP 115/73   Pulse 68   Temp 97.7 F (36.5 C) (Oral)   Ht 6' (1.829 m)   Wt 260 lb (117.9  kg)   BMI 35.26 kg/m    Physical Exam  Constitutional: He is oriented to person, place, and time. He appears well-developed and well-nourished. No distress.  HENT:  Mouth/Throat: Oropharynx is clear and moist. No oropharyngeal exudate.  Cardiovascular: Normal rate, regular rhythm and normal heart sounds. Exam reveals no gallop and no friction rub.  No murmur heard.  Pulmonary/Chest: Effort normal and breath sounds normal. No respiratory distress. He has no wheezes.  Abdominal: Soft. Bowel sounds are normal. He exhibits no distension. There is no tenderness.  Lymphadenopathy:  He has no cervical adenopathy.  Neurological: He is alert and oriented to person, place, and time.  Skin: Skin is warm and dry. No rash noted. No erythema.  Psychiatric: He has a normal mood and affect. His behavior is normal.    CBC Lab Results  Component Value Date   WBC 6.6 07/27/2023   RBC 5.48 07/27/2023   HGB 14.1 07/27/2023   HCT 44.7 07/27/2023   PLT 226 07/27/2023   MCV 81.6 07/27/2023   MCH 25.7 (L) 07/27/2023   MCHC 31.5 (L) 07/27/2023   RDW 15.6 (H) 07/27/2023   EOSABS 73 07/27/2023    BMET Lab Results  Component Value Date   NA 140 07/27/2023   K 4.3 07/27/2023   CL 103 07/27/2023   CO2 30 07/27/2023   GLUCOSE 78 07/27/2023   BUN 12 07/27/2023   CREATININE 0.93 07/27/2023   CALCIUM 9.8 07/27/2023   GFRNONAA >60 04/24/2023    Lab Results  Component Value Date   ESRSEDRATE 2 09/14/2023   Lab Results  Component Value Date   CRP <3.0 09/14/2023     Assessment and Plan Endocarditis/probable septic arthritis of left hip/pji Will check sed rate and crp Will plan to stop abtx at end of march/April  Sees ophtho back in April/may  Right hip pain = improving  Rtc in 2-3 months

## 2023-09-15 LAB — SEDIMENTATION RATE: Sed Rate: 2 mm/h (ref 0–20)

## 2023-09-15 LAB — C-REACTIVE PROTEIN: CRP: <3 mg/L (ref <8.0)

## 2023-09-25 ENCOUNTER — Encounter: Payer: Self-pay | Admitting: Cardiology

## 2023-09-26 ENCOUNTER — Ambulatory Visit: Payer: MEDICAID | Attending: Cardiology

## 2023-09-26 DIAGNOSIS — Z952 Presence of prosthetic heart valve: Secondary | ICD-10-CM

## 2023-09-26 DIAGNOSIS — I4891 Unspecified atrial fibrillation: Secondary | ICD-10-CM | POA: Diagnosis not present

## 2023-09-26 LAB — POCT INR: INR: 2.2 (ref 2.0–3.0)

## 2023-09-26 NOTE — Patient Instructions (Signed)
 Description   Continue taking warfarin 1 tablet (4mg ) daily EXCEPT 1.5 tablets on Sundays and Tuesdays.  Remain consistent with greens each week (only eats greens occasionally).  Recheck INR in 4 weeks.  Coumadin Clinic [phone] Amio 200mg  daily d/c 05/08/23 09811

## 2023-09-27 ENCOUNTER — Encounter: Payer: Self-pay | Admitting: Cardiology

## 2023-09-27 ENCOUNTER — Ambulatory Visit: Payer: MEDICAID | Attending: Cardiology | Admitting: Cardiology

## 2023-09-27 VITALS — BP 120/88 | HR 56 | Ht 72.0 in | Wt 259.2 lb

## 2023-09-27 DIAGNOSIS — I480 Paroxysmal atrial fibrillation: Secondary | ICD-10-CM

## 2023-09-27 DIAGNOSIS — Z952 Presence of prosthetic heart valve: Secondary | ICD-10-CM | POA: Diagnosis not present

## 2023-09-27 NOTE — Patient Instructions (Signed)

## 2023-09-27 NOTE — Progress Notes (Signed)
 Cardiology Office Note:    Date:  09/27/2023   ID:  Dale Mills, DOB [birth year], MRN [id number]  PCP:  Erskine Emery, NP  Cardiologist:  Gypsy Balsam, MD    Referring MD: Nonnie Done., MD   Chief Complaint  Patient presents with   Follow-up    History of Present Illness:    Dale Mills is a 58 y.o. male complex past medical history.  Previously managed by me for hypertension and nonobstructive coronary artery disease in September of last year we end up having endocarditis required aortic valve replacement mitral valve replacement.  Recovery was complicated by CVA.  As well as DVT in the arm.  Did well come today to my office after admit he looks much better than anticipated.  Denies have any chest pain tightness squeezing pressure burning chest, he tells me he is doing well.  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   Enterococcal bacteremia 03/29/2023   Essential hypertension 02/13/2020   Iron deficiency anemia 03/20/2023   Long term (current) use of anticoagulants 05/02/2023   Mitral valve replaced 05/02/2023   Pericardial effusion after operative procedure 04/14/2023   Precordial chest pain 02/13/2020   Protein-calorie malnutrition, severe 03/30/2023   S/P aortic valve replacement and aortoplasty 03/31/2023   Septic shock (HCC) 03/29/2023   Shortness of breath 04/14/2023     Past Surgical History:  Procedure Laterality Date   ASCENDING AORTIC ROOT REPLACEMENT N/A 03/31/2023   Procedure: ASCENDING AORTIC ROOT REPLACEMENT UTILIZING 27 KONECT RESILIA  AORTIC VALVE CONDUIT;  Surgeon: Eugenio Hoes, MD;  Location: [facility] OR;  Service: Open Heart Surgery;  Laterality: N/A;   COLONOSCOPY W/ POLYPECTOMY     FOREARM SURGERY     MASS REMOVED   HIP ARTHROSCOPY Left    LASIK Bilateral    MITRAL VALVE REPLACEMENT N/A 03/31/2023   Procedure: MITRAL VALVE (MV) REPLACEMENT UTILIZING SIZE 33 MOSAIC PORCINE HEART VALVE;  Surgeon: Eugenio Hoes, MD;  Location: [facility] OR;  Service: Open Heart Surgery;  Laterality: N/A;   TEE WITHOUT CARDIOVERSION N/A 03/31/2023   Procedure: TRANSESOPHAGEAL ECHOCARDIOGRAM;  Surgeon: Eugenio Hoes, MD;  Location: [facility] OR;  Service: Open Heart Surgery;  Laterality: N/A;   TONSILECTOMY, ADENOIDECTOMY, BILATERAL MYRINGOTOMY AND TUBES  2011   UMBILICAL HERNIA REPAIR     VASECTOMY      Current Medications: Current Meds  Medication Sig   amoxicillin (AMOXIL) 500 MG capsule Take 2 capsules (1,000 mg total) by mouth 3 (three) times daily.   ascorbic acid (VITAMIN C) 500 MG tablet Take 1 tablet (500 mg total) by mouth daily.   aspirin EC 81 MG tablet Take 1 tablet (81 mg total) by mouth daily. Swallow whole.   atorvastatin (LIPITOR) 80 MG tablet Take 1 tablet (80 mg total) by mouth daily.   ezetimibe (ZETIA) 10 MG tablet Take 1 tablet (10 mg total) by mouth daily.  Fe Fum-Vit C-Vit B12-FA (TRIGELS-F FORTE) CAPS capsule Take 1 capsule by mouth daily after breakfast.   gabapentin (NEURONTIN) 100 MG capsule Take 100 mg by mouth 3 (three) times daily.   metoprolol tartrate (LOPRESSOR) 25 MG tablet Take 0.5 tablets (12.5 mg total) by mouth 2 (two) times daily.   Mouthwashes (MOUTH RINSE) LIQD solution 15 mLs by Mouth Rinse route 2 (two) times daily.   pantoprazole (PROTONIX) 40 MG tablet Take 1 tablet (40 mg total) by mouth daily.   potassium chloride (KLOR-CON M)  10 MEQ tablet Take 1 tablet (10 mEq total) by mouth daily.   tamsulosin (FLOMAX) 0.4 MG CAPS capsule Take 1 capsule (0.4 mg total) by mouth daily after supper.   torsemide (DEMADEX) 20 MG tablet Take 1 tablet (20 mg total) by mouth daily.   traZODone (DESYREL) 50 MG tablet Take 1 tablet (50 mg total) by mouth at bedtime.   warfarin (COUMADIN) 4 MG tablet TAKE 1 TABLET TO 1 1/2 TABLETS BY MOUTH DAILY AS DIRECTED BY COUMADIN CLINIC (Patient taking differently: Take 4 mg by mouth See admin instructions. TAKE 1 TABLET TO 1 1/2 TABLETS BY MOUTH DAILY AS DIRECTED BY COUMADIN CLINIC)     Allergies:   Patient has no known allergies.   Social History   Socioeconomic History   Marital status: Divorced    Spouse name: Not on file   Number of children: 2   Years of education: 32 + SOME COLLEGE   Highest education level: Not on file  Occupational History   Occupation: [employer]  Tobacco Use   Smoking status: Never   Smokeless tobacco: Never  Vaping Use   Vaping status: Never Used  Substance and Sexual Activity   Alcohol use: Never   Drug use: Never   Sexual activity: Not Currently  Other Topics Concern   Not on file  Social History Narrative   Are you right handed or left handed? Left handed   Are you currently employed ?    What is your current occupation? disability   Do you live at home alone? yes   Who lives with you?    What type of home do you live in: 1 story or 2 story? one    Caffiene 2 cups a day   Social Drivers of Corporate investment banker Strain: Not on file  Food Insecurity: Patient Unable To Answer (03/28/2023)   Hunger Vital Sign    Worried About Running Out of Food in the Last Year: Patient unable to answer    Ran Out of Food in the Last Year: Patient unable to answer  Recent Concern: Food Insecurity - Food Insecurity Present (03/17/2023)   Hunger Vital Sign    Worried About Running Out of Food in the Last Year: Sometimes true    Ran Out of Food in the Last Year:  Sometimes true  Transportation Needs: Patient Unable To Answer (03/28/2023)   PRAPARE - Administrator, Civil Service (Medical): Patient unable to answer    Lack of Transportation (Non-Medical): Patient unable to answer  Physical Activity: Not on file  Stress: Not on file  Social Connections: Not on file     Family History: The patient's family history includes CAD in his mother; Cancer in his child; Heart attack in his father and paternal grandfather; Kidney failure in his mother. ROS:   Please see the history of present illness.    All 14 point review of systems negative except as described  per history of present illness  EKGs/Labs/Other Studies Reviewed:         Recent Labs: 03/29/2023: ALT 29 04/05/2023: Magnesium 1.7 07/13/2023: TSH 2.37 07/27/2023: BUN 12; Creat 0.93; Hemoglobin 14.1; Platelets 226; Potassium 4.3; Sodium 140  Recent Lipid Panel    Component Value Date/Time   CHOL 142 03/31/2023 0347   CHOL 130 07/08/2022 0913   TRIG 189 (H) 04/01/2023 0508   HDL <10 (L) 03/31/2023 0347   HDL 37 (L) 07/08/2022 0913   CHOLHDL NOT CALCULATED 03/31/2023 0347   VLDL 80 (H) 03/31/2023 0347   LDLCALC NOT CALCULATED 03/31/2023 0347   LDLCALC 76 07/08/2022 0913    Physical Exam:    VS:  BP 120/88 (BP Location: Left Arm, Patient Position: Sitting)   Pulse (!) 56   Ht 6' (1.829 m)   Wt 259 lb 3.2 oz (117.6 kg)   SpO2 95%   BMI 35.15 kg/m     Wt Readings from Last 3 Encounters:  09/27/23 259 lb 3.2 oz (117.6 kg)  09/14/23 260 lb (117.9 kg)  07/27/23 249 lb (112.9 kg)     GEN:  Well nourished, well developed in no acute distress HEENT: Normal NECK: No JVD; No carotid bruits LYMPHATICS: No lymphadenopathy CARDIAC: RRR, n systolic murmur grade 1/6 percent of the apex, no rubs, no gallops RESPIRATORY:  Clear to auscultation without rales, wheezing or rhonchi  ABDOMEN: Soft, non-tender, non-distended MUSCULOSKELETAL:  No edema; No deformity  SKIN: Warm and  dry LOWER EXTREMITIES: no swelling NEUROLOGIC:  Alert and oriented x 3 PSYCHIATRIC:  Normal affect   ASSESSMENT:    1. S/P aortic valve replacement and aortoplasty   2. Mitral valve replaced    PLAN:    In order of problems listed above:  Status post aortic valve replacement secondary to endocarditis and severe arctic recent sufficiency is a 27 mm Edwards valve in aortic position there was also repair of his aorta.  Stable from that point review within next few months we will repeat echocardiogram hemodynamically compensated. Mitral valve replacement 33 mm Medtronic bioprosthetic valve in mitral position.  Doing well from that point review, Paroxysmal atrial fibrillation, on anticoagulation will continue.  He got history of stroke and DVT. Dyslipidemia on appropriate medications   Medication Adjustments/Labs and Tests Ordered: Current medicines are reviewed at length with the patient today.  Concerns regarding medicines are outlined above.  No orders of the defined types were placed in this encounter.  Medication changes: No orders of the defined types were placed in this encounter.   Signed, Georgeanna Lea, MD, [facility] 09/27/2023 12:28 PM    [facility]

## 2023-10-22 ENCOUNTER — Other Ambulatory Visit: Payer: Self-pay | Admitting: Cardiology

## 2023-10-22 DIAGNOSIS — Z952 Presence of prosthetic heart valve: Secondary | ICD-10-CM

## 2023-10-23 ENCOUNTER — Telehealth: Payer: Self-pay

## 2023-10-23 NOTE — Telephone Encounter (Signed)
   Pre-operative Risk Assessment    Patient Name: Dale Mills  DOB: 04-13-1966 MRN: [id number]  Date of last office visit: 09/27/2023 Date of next office visit: 10/24/2023  Request for Surgical Clearance  Procedure:   Deep cleaning  Date of Surgery:  Clearance TBD                              Surgeon:  Dr. Jeralyn Mon  Surgeon's Group or Practice Name:  [facility]  Phone number:  [phone] Fax number:  [phone] Type of Clearance Requested:   - Medical  - Pharmacy:  Hold Warfarin (Coumadin) Please advise  Type of Anesthesia:  Not Indicated   Additional requests/questions:    Signed, [place] C Thomasenia Dowse   10/23/2023, 4:42 PM

## 2023-10-24 ENCOUNTER — Ambulatory Visit: Payer: MEDICAID | Attending: Cardiology

## 2023-10-24 DIAGNOSIS — I4891 Unspecified atrial fibrillation: Secondary | ICD-10-CM | POA: Diagnosis not present

## 2023-10-24 DIAGNOSIS — Z952 Presence of prosthetic heart valve: Secondary | ICD-10-CM | POA: Diagnosis not present

## 2023-10-24 LAB — POCT INR: INR: 2.1 (ref 2.0–3.0)

## 2023-10-24 NOTE — Patient Instructions (Signed)
 Description   Continue taking warfarin 1 tablet (4mg ) daily EXCEPT 1.5 tablets on Sundays and Tuesdays.  Remain consistent with greens each week (only eats greens occasionally).  Recheck INR in 6 weeks.  [facility]in Clinic [phone] Amio 200mg  daily d/c 05/08/23 32440

## 2023-10-24 NOTE — Telephone Encounter (Signed)
    Primary Cardiologist: Ralene Burger, MD  Chart reviewed as part of pre-operative protocol coverage. Simple dental extractions or cleanings are considered low risk procedures per guidelines and generally do not require any specific cardiac clearance. It is also generally accepted that for simple extractions and dental cleanings, there is no need to interrupt blood thinner therapy.   SBE prophylaxis IS required for the patient.  I will route this recommendation to the requesting party via Epic fax function and remove from pre-op pool.  Please call with questions.  Ava Boatman, NP 10/24/2023, 9:14 AM

## 2023-11-07 NOTE — Progress Notes (Signed)
 NEUROLOGY FOLLOW UP OFFICE NOTE  Dale Mills [id number]  Subjective:  Dale Mills is a 58 y.o. 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  To briefly review: 07/13/23: Patient had been feeling unwell for a while. There was concerns that symptoms started after left hip surgery in 10/2022. Patient presented to hospital in 03/2023 after blacking out at work. He also had nausea, fevers, chills, and weakness. He was found to be hypotensive with leukocytosis and AKI in ED. Echo showed mitral valve vegetation so he was sent to [facility] hospital on 03/28/23. 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  He underwent AVR and aortic root replacement.  Suffered acute ischemic infarct, felt to be cardioembolic.  Postop day 7 his left arm was noted to be swollen, venous duplex was arranged revealing left internal jugular DVT and left superficial cephalic vein thrombosis he ultimately transferred to inpatient rehab on 04/07/2023.   He was evaluated by [facility] on 05/01/2023, chest x-ray was concerning for possible pericardial effusion, Dr. Othelia Blinks felt it was a left pleural effusion and patient was instructed to increase his torsemide  40 mg daily along with potassium supplementation for 5 days.  Evaluated 05/04/2023 in our office, feeling okay, continue to diurese.  Evaluated by Dr. Honey Lusty on 05/08/2023, amiodarone  was stopped.    While in the hospital, patient had persistent AMS, and CTH showed a right PCA infarct. MRI brain confirmed an embolic appearing right PCA infarct. 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 He was discharged to inpatient rehab (was there for 1 month).   Since discharge, he has made a lot of progress per daughter. Patient continues to endorse left sided visual field loss. He occasionally has flashing lights and visual hallucinations in his vision as well. He has noticed worse memory as well. Daughters has concerns about his cognition. Daughters are taking care of his finances and he does not drive. He sniffs a lot and contorts his face. He also has weird hand movements while talking that is new. Patient is unaware of this. He lives alone.   He does not have clear dysarthria or dysphagia. Both feet have pain and numbness. This has been present since he was in the hospital. He has seen podiatry who discussed gabapentin. Patient walks with a walker and feels his legs are weak, but this may have been since hip surgery and not clearly different since his hospitalization.   He now walks with a walker and is working with PT. He has speech therapy. All the therapy is ending soon. Patient is currently on asa 81 mg daily, warfarin, lipitor  80 mg daily, and zetia  10 mg daily (for secondary stroke prevention). While he is prescribed these, it is unclear what he is taking or not, but patient thinks he takes everything he was given.   Patient continues on antibiotics for endocarditis.  Most recent Assessment and Plan (07/13/23): [name] is a 58 y.o. male who presents for evaluation of visual deficits, pain and numbness in feet, and cognitive changes. 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 His neurological examination is pertinent for left homonomous hemianopia [facility]) and patchy sensory loss in his legs. He does not know his history or medications well, so cognitive deficit is  also likely. Available diagnostic data is significant for MRI brain with large right PCA infarct and smaller infarcts in bilateral parietal lobes and cerebellum. MRA showed a right P4 occlusion. Stroke was the result of septic emboli from endocarditis. This well explains his [facility]. His cognitive changes could be due to recent infection, hospitalization, and stroke, but cognitive deficits would not be expected by stroke seen on MRI. Similarly, the patchy sensory loss in the legs is not well explained. It is not likely from stroke. It is also not very symmetric, as would be expected from a peripheral neuropathy. I offered EMG to better evaluate, but patient deferred. I will get labs to look for treatable causes of his symptoms and continue secondary stroke prevention.    PLAN: -Blood work: B1, B12, folate, IFE, vit D, TSH -Discussed EMG, patient deferred today -Continue asa 81 mg daily, warfarin, lipitor  80 mg daily, and zetia  10 mg daily for secondary stroke prevention -Continue PT. Continue home speech therapy.  Since their last visit: Labs were normal.   His vision has improved he thinks. He is walking better. The pain and numbness in his legs have improved. He takes gabapentin 100 mg TID and thinks this works well.  He finished PT and speech therapy.   He is still not driving. His daughter is still taking care of his finances. He states this is because she wants to help, not that he can't. He denies any further visual hallucinations. Patient thinks his cognition is much improved. He does know all of his medications today.  He has no new complaints.  MEDICATIONS:  Outpatient Encounter Medications as of 11/17/2023  Medication Sig   amoxicillin  (AMOXIL ) 500 MG capsule Take 2 capsules (1,000 mg total) by mouth 3 (three) times daily.   ascorbic acid  (VITAMIN C ) 500 MG tablet Take 1 tablet (500 mg total) by mouth daily. (Patient taking differently: Take 500 mg by mouth in the morning and at bedtime.)    aspirin  EC 81 MG tablet Take 1 tablet (81 mg total) by mouth daily. Swallow whole.   atorvastatin  (LIPITOR ) 80 MG tablet Take 1 tablet (80 mg total) by mouth daily.   ezetimibe  (ZETIA ) 10 MG tablet Take 1 tablet (10 mg total) by mouth daily.   Fe Fum-Vit C-Vit B12-FA (TRIGELS-F FORTE) CAPS capsule Take 1 capsule by mouth daily after breakfast.   gabapentin (NEURONTIN) 100 MG capsule Take 100 mg by mouth 3 (three) times daily.   metoprolol  tartrate (LOPRESSOR ) 25 MG tablet Take 0.5 tablets (12.5 mg total) by mouth 2 (two) times daily.   pantoprazole  (PROTONIX ) 40 MG tablet Take 1 tablet (40 mg total) by mouth daily.   potassium chloride  (KLOR-CON  M) 10 MEQ tablet Take 1 tablet (10 mEq total) by mouth daily.   tamsulosin  (FLOMAX ) 0.4 MG CAPS capsule Take 1 capsule (0.4 mg total) by mouth daily after supper.   torsemide  (DEMADEX ) 20 MG tablet Take 1 tablet (20 mg total) by mouth daily.   traZODone  (DESYREL ) 50 MG tablet Take 1 tablet (50 mg total) by mouth at bedtime.   warfarin (COUMADIN ) 4 MG tablet TAKE 1 TO 1 & 1/2 (ONE & ONE-HALF) TABLETS BY MOUTH ONCE DAILY AS DIRECTED BY  COUMADIN   CLINIC   Mouthwashes (MOUTH RINSE) LIQD solution 15 mLs by Mouth Rinse route 2 (two) times daily.   No facility-administered encounter medications on file as of 11/17/2023.    PAST MEDICAL HISTORY: Past Medical History:  Diagnosis Date   Acute ischemic left posterior cerebral artery (PCA) stroke (HCC) 04/09/2023   Acute respiratory failure (HCC) 03/29/2023   Angina pectoris (HCC) Canadian classification 2 04/10/2020   Anxiety    Arthritis    Atrial fibrillation (HCC) 05/02/2023   Bilateral lower extremity edema 04/14/2023   Coronary artery disease coronary CT angio did not show any major issues, suspicion for distal disease 06/12/2020   Debility 04/10/2023   Depression    DJD (degenerative joint disease)    DVT (deep venous thrombosis) (HCC) 05/02/2023   Dyslipidemia 02/13/2020   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   Enterococcal bacteremia 03/29/2023   Essential hypertension 02/13/2020   Iron deficiency anemia 03/20/2023   Long term (current) use of anticoagulants 05/02/2023   Mitral valve replaced 05/02/2023   Pericardial effusion after operative procedure 04/14/2023   Precordial chest pain 02/13/2020   Protein-calorie malnutrition, severe 03/30/2023   S/P aortic valve replacement and aortoplasty 03/31/2023   Septic shock (HCC) 03/29/2023   Shortness of breath 04/14/2023    PAST SURGICAL HISTORY: Past Surgical History:  Procedure Laterality Date   ASCENDING AORTIC ROOT REPLACEMENT N/A 03/31/2023   Procedure: ASCENDING AORTIC ROOT REPLACEMENT UTILIZING 27 KONECT RESILIA  AORTIC VALVE CONDUIT;  Surgeon: Melene Sportsman, MD;  Location: [facility] OR;  Service: Open Heart Surgery;  Laterality: N/A;   COLONOSCOPY W/ POLYPECTOMY     FOREARM SURGERY     MASS REMOVED   HIP ARTHROSCOPY Left    LASIK Bilateral    MITRAL VALVE REPLACEMENT N/A 03/31/2023   Procedure: MITRAL VALVE (MV) REPLACEMENT UTILIZING SIZE 33 MOSAIC PORCINE HEART VALVE;  Surgeon: Melene Sportsman, MD;  Location: [facility] OR;  Service: Open Heart Surgery;  Laterality: N/A;   TEE WITHOUT CARDIOVERSION N/A 03/31/2023   Procedure: TRANSESOPHAGEAL ECHOCARDIOGRAM;  Surgeon: Melene Sportsman, MD;  Location: [facility] OR;  Service: Open Heart Surgery;  Laterality: N/A;   TONSILECTOMY, ADENOIDECTOMY, BILATERAL MYRINGOTOMY AND TUBES  2011   UMBILICAL HERNIA REPAIR     VASECTOMY      ALLERGIES: No Known Allergies  FAMILY HISTORY: Family History  Problem Relation Age of Onset   CAD Mother    Kidney failure Mother    Heart attack Father    Heart attack Paternal Grandfather    Cancer Child        RARE BONE CANCER RESULTING IN TOTAL AMPUTATION OF LIMB    SOCIAL HISTORY: Social History   Tobacco Use   Smoking status: Never   Smokeless tobacco: Never  Vaping Use   Vaping status: Never Used  Substance Use Topics   Alcohol use: Never    Drug use: Never   Social History   Social History Narrative   Are you right handed or left handed? Left handed   Are you currently employed ?    What is your current occupation? disability   Do you live at home alone? yes   Who lives with you?    What type of home do you live in: 1 story or 2 story? one    Caffiene 2 cups a day      Objective:  Vital Signs:  BP 117/77   Pulse (!) 59   Ht 6' (1.829 m)   Wt 265 lb (120.2 kg)  SpO2 97%   BMI 35.94 kg/m   General: No acute distress.  Patient appears well-groomed.   Head:  Normocephalic/atraumatic Neck: supple Lungs: Non-labored breathing on room air   Neurological Exam: Mental status: alert and oriented, speech fluent and not dysarthric, language intact.  Cranial nerves: CN I: not tested CN II: pupils equal, round and reactive to light, visual fields mildly improved; Left lower quadrantanopia CN III, IV, VI:  full range of motion, no nystagmus, ?mild right ptosis CN V: facial sensation intact. CN VII: upper and lower face symmetric CN VIII: hearing intact CN IX, X: uvula midline CN XI: sternocleidomastoid and trapezius muscles intact CN XII: tongue midline  Bulk & Tone: normal in b/l UE and RLE, mildly increased in LLE Motor:  muscle strength 5/5 throughout Deep Tendon Reflexes:  2+ throughout,  toes downgoing.   Sensation:  Pinprick sensation intact. Finger to nose testing:  Without dysmetria.   Gait:  Able to stand without using arms. Left leg not as fluid as right. Appears mildly spastic.   Labs and Imaging review: New results: 09/14/23: ESR wnl CRP wnl  07/13/23: Vit D wnl IFE: no M protein Folate wnl B12: 518 B1 wnl TSH wnl  MRI left hip (08/09/23): IMPRESSION: 1. Subchondral bone marrow edema on either side of the pubic symphysis with small amount of joint fluid consistent with osteitis pubis versus septic arthritis. 2. Left total hip arthroplasty with susceptibility artifact partially obscuring  the adjacent soft tissue and osseous structures. No periarticular fluid collection or osteolysis.  Previously reviewed results: 06/19/23: ESR mildly elevated at 22 CRP wnl   04/24/23: CBC significant for Hb 10.1 BMP significant for glucose of 148   HbA1c (03/29/23): 6.2 HIV non-reactive   Imaging: MRI brain wo contrast (03/29/23): FINDINGS: Brain: Confluent acute right PCA territory infarct in the right occipital lobe. Additional smaller acute infarcts in the right parietal lobe and bilateral cerebellum. A few punctate acute infarcts in the left parietal lobe. Associated edema without substantial mass effect. FLAIR hyperintensity along the right frontal lobe with susceptibility artifact, compatible with subarachnoid hemorrhage seen on same day CT head. No hydrocephalus or mass lesion. No midline shift.   Vascular: Major arterial flow voids are maintained skull base.   Skull and upper cervical spine: Normal marrow signal.   Sinuses/Orbits: Mostly clear sinuses.  No acute orbital findings.   Other: Right greater than left mastoid effusions.   IMPRESSION: 1. Confluent right PCA territory occipital infarct. 2. Additional smaller acute infarcts in bilateral parietal lobes and the cerebellum. 3. Small volume of acute subarachnoid hemorrhage along the right frontal convexity, likely similar when comparing across modalities to same day CT head. 4. Given reported infective endocarditis the above findings could be secondary to septic emboli. If the patient does not improve, recommend low threshold for repeat MRI with contrast.   MRA head wo contrast (03/30/23): FINDINGS: Anterior circulation: No aneurysm. No proximal occlusion. No significant stenosis.   Posterior circulation: The P4 segment of the right PCA is occluded. Left PCA territory and bilateral vertebral arteries are normal in appearance.   Anatomic variants: None   Other: None.   IMPRESSION: Occlusion of the P4  segment of the right PCA, in keeping with the patient's history of confluence right PCA territory infarct.   Echocardiogram (05/12/23):  1. Left ventricular ejection fraction, by estimation, is 60 to 65%. The  left ventricle has normal function. The left ventricle has no regional  wall motion abnormalities. There is moderate concentric  left ventricular  hypertrophy. Left ventricular  diastolic parameters were normal. The average left ventricular global  longitudinal strain is 11.4 %. The global longitudinal strain is abnormal.   2. Right ventricular systolic function is normal. The right ventricular  size is normal.   3. Left atrial size was mildly dilated.   4. 33 Mosaic porcinr MVR mean gradient 4 mm Hg. No evidence of mitral  valve regurgitation. There is a 33 mm Medtronic bioprosthetic valve  present in the mitral position. Procedure Date: 03/31/2023. Echo findings  are consistent with normal structure and  function of the mitral valve prosthesis.   5. Aortic valve regurgitation is not visualized. There is a 27 mm Edwards  Resilia valve present in the aortic position. Procedure Date: 03/31/2023.  Echo findings are consistent with normal structure and function of the  aortic valve prosthesis.   6. Root and ascending aorta replacement      . Aortic Normal arch and DTA and root/ascending aorta has been  replaced.   7. The inferior vena cava is normal in size with greater than 50%  respiratory variability, suggesting right atrial pressure of 3 mmHg.    Carotid ultrasound (03/29/23): Summary:  Right Carotid: Limited exam due to line placement, the extracranial  vessels that                 were visualized are near-normal with only minimal wall  thickening                or plaque.   Left Carotid: The extracranial vessels were near-normal with only minimal  wall               thickening or plaque.   Vertebrals:  Bilateral vertebral arteries demonstrate antegrade flow.  Subclavians:  Normal flow hemodynamics were seen in bilateral subclavian               arteries.   Assessment/Plan:  This is DOMINIC DINUCCI, a 58 y.o. 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 His strokes were large in the right PCA territory and smaller strokes in the right parietal lobe and cerebellum. His symptoms include left homonomous hemianopia [facility]) and some increased tone in LLE and abnormal gait. The [facility] is well explained by his strokes. There is some extension of the strokes into the parietal and maybe frontal motor strip area that could explain some left leg symptoms. His left hip pathology may also be contributing. He is much improved from prior though.  Plan: -Continue asa 81 mg daily and Warfarin as per cardiology -Continue lipitor  80 mg daily and Zetia  10 mg daily -Continue home PT exercises -Stroke warning signs discussed -Fall precautions discussed  Return to clinic as needed  Total time spent reviewing records, interview, history/exam, documentation, and coordination of care on day of encounter:  35 min  Rommie Coats, MD

## 2023-11-17 ENCOUNTER — Telehealth: Payer: Self-pay | Admitting: Cardiology

## 2023-11-17 ENCOUNTER — Ambulatory Visit: Payer: MEDICAID | Admitting: Neurology

## 2023-11-17 ENCOUNTER — Encounter: Payer: Self-pay | Admitting: Neurology

## 2023-11-17 VITALS — BP 117/77 | HR 59 | Ht 72.0 in | Wt 265.0 lb

## 2023-11-17 DIAGNOSIS — I38 Endocarditis, valve unspecified: Secondary | ICD-10-CM | POA: Diagnosis not present

## 2023-11-17 DIAGNOSIS — I63532 Cerebral infarction due to unspecified occlusion or stenosis of left posterior cerebral artery: Secondary | ICD-10-CM | POA: Diagnosis not present

## 2023-11-17 DIAGNOSIS — R269 Unspecified abnormalities of gait and mobility: Secondary | ICD-10-CM

## 2023-11-17 DIAGNOSIS — R209 Unspecified disturbances of skin sensation: Secondary | ICD-10-CM

## 2023-11-17 DIAGNOSIS — R4189 Other symptoms and signs involving cognitive functions and awareness: Secondary | ICD-10-CM

## 2023-11-17 DIAGNOSIS — R7303 Prediabetes: Secondary | ICD-10-CM

## 2023-11-17 NOTE — Telephone Encounter (Signed)
 Daughter Catering manager) called to follow-up on patient's EKG test results.

## 2023-11-17 NOTE — Patient Instructions (Signed)
-  Continue aspirin  81 mg daily and Warfarin as directed by cardiology -Continue lipitor  80 mg daily and Zetia  10 mg daily for cholesterol -Continue home physical therapy exercises and staying active  If you have new difficulty speaking, face droop, numbness on one side of the body, weakness on one side of the body, or dizziness/imbalance, this could be the sign of a stroke. Don't wait, please call EMS and be evaluated at the nearest emergency room.  Follow up with me as needed  The physicians and staff at [facility] are committed to providing excellent care. You may receive a survey requesting feedback about your experience at our office. We strive to receive "very good" responses to the survey questions. If you feel that your experience would prevent you from giving the office a "very good " response, please contact our office to try to remedy the situation. We may be reached at [phone]. Thank you for taking the time out of your busy day to complete the survey.  Rommie Coats, MD [facility]

## 2023-11-17 NOTE — Telephone Encounter (Signed)
 Spoke with [facility] per DPR who was asking about the EKG in MyChart. Advised that it was done at his PCP's office and sent to Dr. Gordan Latina to review. Advised if he felt it was abnormal someone would have called to get him an appointment. Pt verbalized understanding and had no additional questions.

## 2023-11-23 ENCOUNTER — Ambulatory Visit: Payer: MEDICAID | Admitting: Internal Medicine

## 2023-11-30 ENCOUNTER — Other Ambulatory Visit: Payer: Self-pay

## 2023-11-30 ENCOUNTER — Encounter: Payer: Self-pay | Admitting: Internal Medicine

## 2023-11-30 ENCOUNTER — Ambulatory Visit: Payer: MEDICAID | Admitting: Internal Medicine

## 2023-11-30 VITALS — BP 132/87 | HR 61 | Temp 97.5°F | Ht 72.0 in | Wt 265.0 lb

## 2023-11-30 DIAGNOSIS — I330 Acute and subacute infective endocarditis: Secondary | ICD-10-CM

## 2023-11-30 DIAGNOSIS — I059 Rheumatic mitral valve disease, unspecified: Secondary | ICD-10-CM | POA: Diagnosis not present

## 2023-11-30 DIAGNOSIS — Z952 Presence of prosthetic heart valve: Secondary | ICD-10-CM

## 2023-11-30 NOTE — Progress Notes (Signed)
 Patient ID: Dale Mills, male   DOB: 1965/09/09, 58 y.o.   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 Now off of abtx. He Had 2 teeth upper right molars extractions. Had abtx proph. And one tooth reported was infected per dentist. Now feeling back to normal.  Approved for disability.  Outpatient Encounter Medications as of 11/30/2023  Medication Sig   ascorbic acid  (VITAMIN C ) 500 MG tablet Take 1 tablet (500 mg total) by mouth daily. (Patient taking differently: Take 500 mg by mouth in the morning and at bedtime.)   aspirin  EC 81 MG tablet Take 1 tablet (81 mg total) by mouth daily. Swallow whole.   atorvastatin  (LIPITOR ) 80 MG tablet Take 1 tablet (80 mg total) by mouth daily.   ezetimibe  (ZETIA ) 10 MG tablet Take 1 tablet (10 mg total) by mouth daily.   gabapentin (NEURONTIN) 100 MG capsule Take 100 mg by mouth 3 (three) times daily.   metoprolol  tartrate (LOPRESSOR ) 25 MG tablet Take 0.5 tablets (12.5 mg total) by mouth 2 (two) times daily.   pantoprazole  (PROTONIX ) 40 MG tablet Take 1 tablet (40 mg total) by mouth daily.   potassium chloride  (KLOR-CON  M) 10 MEQ tablet Take 1 tablet (10 mEq total) by mouth daily.   tamsulosin  (FLOMAX ) 0.4 MG CAPS capsule Take 1 capsule (0.4 mg total) by mouth daily after supper.   torsemide  (DEMADEX ) 20 MG tablet Take 1 tablet (20 mg total) by mouth daily.   traZODone  (DESYREL ) 50 MG tablet Take 1 tablet (50 mg total) by mouth at bedtime.   warfarin (COUMADIN ) 4 MG tablet TAKE 1 TO 1 & 1/2 (ONE & ONE-HALF) TABLETS BY MOUTH ONCE DAILY AS DIRECTED BY  COUMADIN   CLINIC   amoxicillin  (AMOXIL ) 500 MG capsule Take 2 capsules (1,000 mg total) by mouth 3 (three) times daily. (Patient not taking: Reported on 11/30/2023)   Fe Fum-Vit C-Vit B12-FA (TRIGELS-F FORTE) CAPS capsule Take 1 capsule by mouth daily after breakfast. (Patient not taking: Reported on  11/30/2023)   No facility-administered encounter medications on file as of 11/30/2023.     Patient Active Problem List   Diagnosis Date Noted   Anxiety    Arthritis    Depression    DJD (degenerative joint disease)    Long term (current) use of anticoagulants 05/02/2023   Atrial fibrillation (HCC) 05/02/2023   Mitral valve replaced 05/02/2023   DVT (deep venous thrombosis) (HCC) 05/02/2023   Pericardial effusion after operative procedure 04/14/2023   Shortness of breath 04/14/2023   Bilateral lower extremity edema 04/14/2023   Debility 04/10/2023   Acute ischemic left posterior cerebral artery (PCA) stroke (HCC) 04/09/2023   S/P aortic valve replacement and aortoplasty 03/31/2023   Protein-calorie malnutrition, severe 03/30/2023   Acute respiratory failure (HCC) 03/29/2023   Septic shock (HCC) 03/29/2023   Enterococcal bacteremia 03/29/2023   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   Iron deficiency anemia 03/20/2023   Coronary artery disease coronary CT angio did not show any major issues, suspicion for distal disease 06/12/2020   Angina pectoris (HCC) Canadian classification 2 04/10/2020   Precordial chest pain 02/13/2020   Essential hypertension 02/13/2020   Dyslipidemia 02/13/2020     Health Maintenance Due  Topic Date Due   Hepatitis C Screening  Never done   DTaP/Tdap/Td (1 - Tdap) Never done   Pneumococcal Vaccine 97-57 Years old (1 of  2 - PCV) Never done   Zoster Vaccines- Shingrix (1 of 2) Never done   Colonoscopy  Never done   COVID-19 Vaccine (2 - Pfizer risk series) 05/03/2023     Review of Systems Review of Systems  Constitutional: Negative for fever, chills, diaphoresis, activity change, appetite change, fatigue and unexpected weight change.  HENT: Negative for congestion, sore throat, rhinorrhea, sneezing, trouble swallowing and sinus pressure.  Eyes: Negative for photophobia and visual disturbance.  Respiratory: Negative for cough, chest tightness,  shortness of breath, wheezing and stridor.  Cardiovascular: Negative for chest pain, palpitations and leg swelling.  Gastrointestinal: Negative for nausea, vomiting, abdominal pain, diarrhea, constipation, blood in stool, abdominal distention and anal bleeding.  Genitourinary: Negative for dysuria, hematuria, flank pain and difficulty urinating.  Musculoskeletal: Negative for myalgias, back pain, joint swelling, arthralgias and gait problem.  Skin: Negative for color change, pallor, rash and wound.  Neurological: Negative for dizziness, tremors, weakness and light-headedness.  Hematological: Negative for adenopathy. Does not bruise/bleed easily.  Psychiatric/Behavioral: Negative for behavioral problems, confusion, sleep disturbance, dysphoric mood, decreased concentration and agitation.   Physical Exam   BP 132/87   Pulse 61   Temp (!) 97.5 F (36.4 C) (Temporal)   Ht 6' (1.829 m)   Wt 265 lb (120.2 kg)   SpO2 95%   BMI 35.94 kg/m   Physical Exam  Constitutional: He is oriented to person, place, and time. He appears well-developed and well-nourished. No distress.  HENT:  Mouth/Throat: Oropharynx is clear and moist. No oropharyngeal exudate.  Cardiovascular: Normal rate, regular rhythm and normal heart sounds. Exam reveals no gallop and no friction rub.  No murmur heard.  Pulmonary/Chest: Effort normal and breath sounds normal. No respiratory distress. He has no wheezes.  Abdominal: Soft. Bowel sounds are normal. He exhibits no distension. There is no tenderness.  Lymphadenopathy:  He has no cervical adenopathy.  Neurological: He is alert and oriented to person, place, and time.  Skin: Skin is warm and dry. No rash noted. No erythema.  Psychiatric: He has a normal mood and affect. His behavior is normal.    CBC Lab Results  Component Value Date   WBC 6.6 07/27/2023   RBC 5.48 07/27/2023   HGB 14.1 07/27/2023   HCT 44.7 07/27/2023   PLT 226 07/27/2023   MCV 81.6 07/27/2023    MCH 25.7 (L) 07/27/2023   MCHC 31.5 (L) 07/27/2023   RDW 15.6 (H) 07/27/2023   EOSABS 73 07/27/2023    BMET Lab Results  Component Value Date   NA 140 07/27/2023   K 4.3 07/27/2023   CL 103 07/27/2023   CO2 30 07/27/2023   GLUCOSE 78 07/27/2023   BUN 12 07/27/2023   CREATININE 0.93 07/27/2023   CALCIUM  9.8 07/27/2023   GFRNONAA >60 04/24/2023    Lab Results  Component Value Date   ESRSEDRATE 2 09/14/2023   Lab Results  Component Value Date   CRP <3.0 09/14/2023   Lab Results  Component Value Date   ESRSEDRATE 6 11/30/2023   Lab Results  Component Value Date   CRP <3.0 11/30/2023     Assessment and Plan 57yo M with hx of MV endocarditis s/p MVR, off of abtx chronic suppression. Now being monitored off of abtx. We will plan to check.  sed rate and crp. If remains WNL, return as needed  I have personally spent 20 minutes involved in face-to-face and non-face-to-face activities for this patient on the day of the visit. Professional time  spent includes the following activities: Preparing to see the patient (review of tests), Obtaining and/or reviewing separately obtained history (admission/discharge record), Performing a medically appropriate examination and/or evaluation , Ordering medications/tests/procedures, referring and communicating with other health care professionals, Documenting clinical information in the EMR, Independently interpreting results (not separately reported), Communicating results to the patient

## 2023-12-01 LAB — C-REACTIVE PROTEIN: CRP: <3 mg/L (ref <8.0)

## 2023-12-01 LAB — SEDIMENTATION RATE: Sed Rate: 6 mm/h (ref 0–20)

## 2023-12-05 ENCOUNTER — Ambulatory Visit: Payer: MEDICAID

## 2023-12-12 ENCOUNTER — Ambulatory Visit: Payer: MEDICAID | Attending: Cardiology

## 2023-12-12 DIAGNOSIS — I4891 Unspecified atrial fibrillation: Secondary | ICD-10-CM | POA: Diagnosis present

## 2023-12-12 DIAGNOSIS — Z952 Presence of prosthetic heart valve: Secondary | ICD-10-CM | POA: Diagnosis present

## 2023-12-12 LAB — POCT INR: INR: 2.8 (ref 2.0–3.0)

## 2023-12-12 NOTE — Patient Instructions (Signed)
 Description   Continue taking warfarin 1 tablet (4mg ) daily EXCEPT 1.5 tablets on Sundays and Tuesdays.  Remain consistent with greens each week (only eats greens occasionally).  Recheck INR in 6 weeks.  Coumadin  Clinic [phone] Amio 200mg  daily d/c 05/08/23

## 2023-12-28 ENCOUNTER — Other Ambulatory Visit: Payer: Self-pay | Admitting: Cardiology

## 2023-12-28 ENCOUNTER — Ambulatory Visit: Payer: MEDICAID | Admitting: Cardiology

## 2023-12-28 DIAGNOSIS — Z952 Presence of prosthetic heart valve: Secondary | ICD-10-CM

## 2023-12-28 NOTE — Telephone Encounter (Signed)
 Prescription refill request received for warfarin Lov: 09/27/23 ([facility]rasowski)  Next INR check: 01/23/24 Warfarin tablet strength: 4mg   Appropriate dose. Refill sent.

## 2024-01-19 ENCOUNTER — Telehealth: Payer: Self-pay

## 2024-01-23 ENCOUNTER — Ambulatory Visit: Payer: MEDICAID | Attending: Cardiology

## 2024-01-23 ENCOUNTER — Encounter: Payer: Self-pay | Admitting: Cardiology

## 2024-01-23 ENCOUNTER — Ambulatory Visit: Payer: MEDICAID | Admitting: Cardiology

## 2024-01-23 VITALS — BP 126/70 | HR 63 | Ht 72.0 in | Wt 270.4 lb

## 2024-01-23 DIAGNOSIS — I480 Paroxysmal atrial fibrillation: Secondary | ICD-10-CM | POA: Diagnosis not present

## 2024-01-23 DIAGNOSIS — R0609 Other forms of dyspnea: Secondary | ICD-10-CM

## 2024-01-23 DIAGNOSIS — I10 Essential (primary) hypertension: Secondary | ICD-10-CM | POA: Diagnosis present

## 2024-01-23 DIAGNOSIS — E785 Hyperlipidemia, unspecified: Secondary | ICD-10-CM | POA: Insufficient documentation

## 2024-01-23 DIAGNOSIS — I4891 Unspecified atrial fibrillation: Secondary | ICD-10-CM | POA: Diagnosis present

## 2024-01-23 DIAGNOSIS — Z952 Presence of prosthetic heart valve: Secondary | ICD-10-CM

## 2024-01-23 LAB — POCT INR: INR: 2.1 (ref 2.0–3.0)

## 2024-01-23 NOTE — Progress Notes (Signed)
 Cardiology Office Note:    Date:  01/23/2024   ID:  Dale Mills, DOB 12-30-1965, MRN [id number]  PCP:  Silvano Angeline FALCON, NP  Cardiologist:  Lamar Fitch, MD    Referring MD: Silvano Angeline FALCON, NP   Chief Complaint  Patient presents with   Follow-up    History of Present Illness:    Dale Mills is a 58 y.o. male past medical history significant for endocarditis, he required aortic valve replacement as well as mitral valve replacement, recovery was complicated by CVA as well as DVT of the arm, aortic valve was replaced with 27 mm Edwards prosthesis he also required repair of the aortic, mitral valve is a 33 mm Medtronic bioprosthetic valve.  Comes today to months for follow-up overall doing well.  Denies have any chest pain tightness squeezing pressure burning chest.  He said he is feeling well.  No palpitations no swelling of lower extremities  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   Enterococcal bacteremia 03/29/2023   Essential hypertension 02/13/2020   Iron deficiency anemia 03/20/2023   Long term (current) use of anticoagulants 05/02/2023   Mitral valve replaced 05/02/2023   Pericardial effusion after operative procedure 04/14/2023   Precordial chest pain 02/13/2020   Protein-calorie malnutrition, severe 03/30/2023   S/P aortic valve replacement and aortoplasty 03/31/2023    Septic shock (HCC) 03/29/2023   Shortness of breath 04/14/2023    Past Surgical History:  Procedure Laterality Date   ASCENDING AORTIC ROOT REPLACEMENT N/A 03/31/2023   Procedure: ASCENDING AORTIC ROOT REPLACEMENT UTILIZING 27 KONECT RESILIA  AORTIC VALVE CONDUIT;  Surgeon: Maryjane Mt, MD;  Location: [facility] OR;  Service: Open Heart Surgery;  Laterality: N/A;   COLONOSCOPY W/ POLYPECTOMY     FOREARM SURGERY     MASS REMOVED   HIP ARTHROSCOPY Left    LASIK Bilateral    MITRAL VALVE REPLACEMENT N/A 03/31/2023   Procedure: MITRAL VALVE (MV) REPLACEMENT UTILIZING SIZE 33 MOSAIC PORCINE HEART VALVE;  Surgeon: Maryjane Mt, MD;  Location: [facility] OR;  Service: Open Heart Surgery;  Laterality: N/A;   TEE WITHOUT CARDIOVERSION N/A 03/31/2023   Procedure: TRANSESOPHAGEAL ECHOCARDIOGRAM;  Surgeon: Maryjane Mt, MD;  Location: [facility] OR;  Service: Open Heart Surgery;  Laterality: N/A;   TONSILECTOMY, ADENOIDECTOMY, BILATERAL MYRINGOTOMY AND TUBES  2011   UMBILICAL HERNIA REPAIR     VASECTOMY      Current Medications: Current Meds  Medication Sig   amoxicillin  (AMOXIL ) 500 MG capsule Take 2 capsules (1,000 mg total) by mouth 3 (three) times daily.   ascorbic acid  (VITAMIN C ) 500 MG tablet Take 1 tablet (500 mg total) by mouth daily. (Patient taking differently: Take 500 mg by mouth in the morning and at bedtime.)   aspirin  EC 81 MG tablet Take 1 tablet (81 mg total) by mouth daily. Swallow whole.   atorvastatin  (LIPITOR ) 80 MG tablet Take 1 tablet (80  mg total) by mouth daily.   ezetimibe  (ZETIA ) 10 MG tablet Take 1 tablet (10 mg total) by mouth daily.   Fe Fum-Vit C-Vit B12-FA (TRIGELS-F FORTE) CAPS capsule Take 1 capsule by mouth daily after breakfast.   gabapentin (NEURONTIN) 100 MG capsule Take 100 mg by mouth 3 (three) times daily.   metoprolol  tartrate (LOPRESSOR ) 25 MG tablet Take 0.5 tablets (12.5 mg total) by mouth 2 (two) times daily.   pantoprazole  (PROTONIX ) 40 MG tablet Take 1 tablet (40 mg  total) by mouth daily.   potassium chloride  (KLOR-CON  M) 10 MEQ tablet Take 1 tablet (10 mEq total) by mouth daily.   tamsulosin  (FLOMAX ) 0.4 MG CAPS capsule Take 1 capsule (0.4 mg total) by mouth daily after supper.   torsemide  (DEMADEX ) 20 MG tablet Take 1 tablet (20 mg total) by mouth daily.   traZODone  (DESYREL ) 50 MG tablet Take 1 tablet (50 mg total) by mouth at bedtime.   warfarin (COUMADIN ) 4 MG tablet TAKE 1 TO 1 & 1/2 (ONE TO ONE & ONE-HALF) TABLETS BY MOUTH ONCE DAILY AS  DIRECTED  BY  COUMADIN   CLINIC     Allergies:   Patient has no known allergies.   Social History   Socioeconomic History   Marital status: Divorced    Spouse name: Not on file   Number of children: 2   Years of education: 76 + SOME COLLEGE   Highest education level: Not on file  Occupational History   Occupation: [employer]  Tobacco Use   Smoking status: Never   Smokeless tobacco: Never  Vaping Use   Vaping status: Never Used  Substance and Sexual Activity   Alcohol use: Never   Drug use: Never   Sexual activity: Not Currently  Other Topics Concern   Not on file  Social History Narrative   Are you right handed or left handed? Left handed   Are you currently employed ?    What is your current occupation? disability   Do you live at home alone? yes   Who lives with you?    What type of home do you live in: 1 story or 2 story? one    Caffiene 2 cups a day   Social Drivers of Corporate investment banker Strain: Not on file  Food Insecurity: Patient Unable To Answer (03/28/2023)   Hunger Vital Sign    Worried About Running Out of Food in the Last Year: Patient unable to answer    Ran Out of Food in the Last Year: Patient unable to answer  Recent Concern: Food Insecurity - Food Insecurity Present (03/17/2023)   Hunger Vital Sign    Worried About Running Out of Food in the Last Year: Sometimes true    Ran Out of Food in the Last Year: Sometimes true  Transportation Needs: Patient Unable To Answer  (03/28/2023)   PRAPARE - Administrator, Civil Service (Medical): Patient unable to answer    Lack of Transportation (Non-Medical): Patient unable to answer  Physical Activity: Not on file  Stress: Not on file  Social Connections: Not on file     Family History: The patient's family history includes CAD in his mother; Cancer in his child; Heart attack in his father and paternal grandfather; Kidney failure in his mother. ROS:   Please see the history of present illness.    All 14 point review of systems negative except as described per history of present illness  EKGs/Labs/Other Studies Reviewed:  Recent Labs: 03/29/2023: ALT 29 04/05/2023: Magnesium  1.7 07/13/2023: TSH 2.37 07/27/2023: BUN 12; Creat 0.93; Hemoglobin 14.1; Platelets 226; Potassium 4.3; Sodium 140  Recent Lipid Panel    Component Value Date/Time   CHOL 142 03/31/2023 0347   CHOL 130 07/08/2022 0913   TRIG 189 (H) 04/01/2023 0508   HDL <10 (L) 03/31/2023 0347   HDL 37 (L) 07/08/2022 0913   CHOLHDL NOT CALCULATED 03/31/2023 0347   VLDL 80 (H) 03/31/2023 0347   LDLCALC NOT CALCULATED 03/31/2023 0347   LDLCALC 76 07/08/2022 0913    Physical Exam:    VS:  BP 126/70 (BP Location: Left Arm, Patient Position: Sitting)   Pulse 63   Ht 6' (1.829 m)   Wt 270 lb 6.4 oz (122.7 kg)   SpO2 96%   BMI 36.67 kg/m     Wt Readings from Last 3 Encounters:  01/23/24 270 lb 6.4 oz (122.7 kg)  11/30/23 265 lb (120.2 kg)  11/17/23 265 lb (120.2 kg)     GEN:  Well nourished, well developed in no acute distress HEENT: Normal NECK: No JVD; No carotid bruits LYMPHATICS: No lymphadenopathy CARDIAC: RRR, no murmurs, no rubs, no gallops RESPIRATORY:  Clear to auscultation without rales, wheezing or rhonchi  ABDOMEN: Soft, non-tender, non-distended MUSCULOSKELETAL:  No edema; No deformity  SKIN: Warm and dry LOWER EXTREMITIES: no swelling NEUROLOGIC:  Alert and oriented x 3 PSYCHIATRIC:  Normal affect    ASSESSMENT:    1. S/P aortic valve replacement and aortoplasty   2. Paroxysmal atrial fibrillation (HCC)   3. Essential hypertension    PLAN:    In order of problems listed above:  Status post aortic and mitral valve replacement secondary to endocarditis doing well stable hemodynamically compensated, will repeat echocardiogram in 5 months or so then see him back again. Paroxysmal atrial fibrillation anticoagulated which I will continue. Another reason for anticoagulation is history of stroke and DVT. Dyslipidemia I do not see any recent fasting lipid profile we will schedule him to get fasting lipid profile done   Medication Adjustments/Labs and Tests Ordered: Current medicines are reviewed at length with the patient today.  Concerns regarding medicines are outlined above.  No orders of the defined types were placed in this encounter.  Medication changes: No orders of the defined types were placed in this encounter.   Signed, Lamar DOROTHA Fitch, MD, [facility] 01/23/2024 3:42 PM    [facility]

## 2024-01-23 NOTE — Patient Instructions (Signed)
 Description   Continue taking warfarin 1 tablet (4mg ) daily EXCEPT 1.5 tablets on Sundays and Tuesdays.  Remain consistent with greens each week (only eats greens occasionally).  Recheck INR in 6 weeks.  Coumadin  Clinic [phone] Amio 200mg  daily d/c 05/08/23

## 2024-01-23 NOTE — Patient Instructions (Addendum)
 Medication Instructions:  Your physician recommends that you continue on your current medications as directed. Please refer to the Current Medication list given to you today.  *If you need a refill on your cardiac medications before your next appointment, please call your pharmacy*   Lab Work: Your physician recommends that you return for lab work in: when fasting You need to have labs done when you are fasting.  You can come Monday through Friday 8:30 am to 12:00 pm and 1:15 to 4:30. You do not need to make an appointment as the order has already been placed. The labs you are going to have done are AST, ALT Lipids.    Testing/Procedures: Before appt in 6 months Your physician has requested that you have an echocardiogram. Echocardiography is a painless test that uses sound waves to create images of your heart. It provides your doctor with information about the size and shape of your heart and how well your heart's chambers and valves are working. This procedure takes approximately one hour. There are no restrictions for this procedure. Please do NOT wear cologne, perfume, aftershave, or lotions (deodorant is allowed). Please arrive 15 minutes prior to your appointment time.  Please note: We ask at that you not bring children with you during ultrasound (echo/ vascular) testing. Due to room size and safety concerns, children are not allowed in the ultrasound rooms during exams. Our front office staff cannot provide observation of children in our lobby area while testing is being conducted. An adult accompanying a patient to their appointment will only be allowed in the ultrasound room at the discretion of the ultrasound technician under special circumstances. We apologize for any inconvenience.    Follow-Up: At [facility], you and your health needs are our priority.  As part of our continuing mission to provide you with exceptional heart care, we have created designated Provider Care Teams.   These Care Teams include your primary Cardiologist (physician) and Advanced Practice Providers (APPs -  Physician Assistants and Nurse Practitioners) who all work together to provide you with the care you need, when you need it.  We recommend signing up for the patient portal called MyChart.  Sign up information is provided on this After Visit Summary.  MyChart is used to connect with patients for Virtual Visits (Telemedicine).  Patients are able to view lab/test results, encounter notes, upcoming appointments, etc.  Non-urgent messages can be sent to your provider as well.   To learn more about what you can do with MyChart, go to [email or web address].    Your next appointment:   6 month(s)  The format for your next appointment:   In Person  Provider:   Lamar Fitch, MD    Other Instructions NA

## 2024-01-23 NOTE — Telephone Encounter (Signed)
   Pre-operative Risk Assessment    Patient Name: Dale Mills  DOB: 02/23/1966 MRN: [id number] Date of last office visit: 09/27/2023 Date of next office visit: 01/23/2024  Request for Surgical Clearance  Procedure:  Dental Extraction - Amount of Teeth to be Pulled:  Not indicated Date of Surgery:  Clearance TBD                               Surgeon:  Dr. Dumani Surgeon's Group or Practice Name:  [facility] Phone number:  [phone] Fax number:  [phone] Type of Clearance Requested:   - Pharmacy:  Hold Aspirin  and Warfarin (Coumadin ) Not specified Type of Anesthesia:  Not Indicated   Additional requests/questions:  Please fax a copy of medication instructions for before and after the procedure to the surgeon's office.  Signed, Mesha Schamberger M Lizbett Garciagarcia   01/23/2024, 8:14 AM

## 2024-01-23 NOTE — Addendum Note (Signed)
 Addended by: ARLOA PLANAS D on: 01/23/2024 03:57 PM   Modules accepted: Orders

## 2024-01-23 NOTE — Addendum Note (Signed)
 Addended by: JOESPH CHROMAN B on: 01/23/2024 03:43 PM   Modules accepted: Level of Service

## 2024-01-23 NOTE — Progress Notes (Signed)
Please see anticoagulation encounter.

## 2024-01-24 NOTE — Telephone Encounter (Signed)
 I called patient due to the phone number and fax number provided in the clearance is wrong I wasn't able to locate correct number tried looking up the phone number there is multiple offices patient was in the doctor when I called wasn't able to provide me with information he asked if we could call him tomorrow. Will keep trying to find correct number to contact office

## 2024-01-25 NOTE — Telephone Encounter (Signed)
    Primary Cardiologist: Lamar Fitch, MD  Chart reviewed as part of pre-operative protocol coverage. Simple dental extractions are considered low risk procedures per guidelines and generally do not require any specific cardiac clearance. It is also generally accepted that for simple extractions and dental cleanings, there is no need to interrupt blood thinner therapy.   SBE prophylaxis is required for the patient.  I will route this recommendation to the requesting party via Epic fax function and remove from pre-op pool.  Please call with questions.  Lum LITTIE Louis, NP 01/25/2024, 10:44 AM

## 2024-01-25 NOTE — Telephone Encounter (Signed)
 Spoke with [facility] and they stated that he patient is having 1 tooth extraction with local anesthesia  The Correct Phone number: [phone] The Correct Fax number: [phone]  Will route to the Preop pool for the preop APP to review

## 2024-02-22 ENCOUNTER — Telehealth: Payer: Self-pay

## 2024-02-22 NOTE — Telephone Encounter (Signed)
 Patient daughter called to follow up on MRI results from Jan. Spoke with Dr. Luiz who states his hip still looked good and his inflammatory markers are all normal.   Pt is c/o limp and some agitation on hip. Will follow up with PCP to advise on next steps. Lorenda CHRISTELLA Code, RMA

## 2024-03-03 ENCOUNTER — Other Ambulatory Visit: Payer: Self-pay | Admitting: Cardiology

## 2024-03-03 DIAGNOSIS — Z952 Presence of prosthetic heart valve: Secondary | ICD-10-CM

## 2024-03-05 ENCOUNTER — Ambulatory Visit: Payer: MEDICAID | Attending: Cardiology

## 2024-03-05 DIAGNOSIS — Z5181 Encounter for therapeutic drug level monitoring: Secondary | ICD-10-CM | POA: Insufficient documentation

## 2024-03-05 DIAGNOSIS — Z952 Presence of prosthetic heart valve: Secondary | ICD-10-CM | POA: Diagnosis present

## 2024-03-05 DIAGNOSIS — I4891 Unspecified atrial fibrillation: Secondary | ICD-10-CM | POA: Diagnosis present

## 2024-03-05 LAB — POCT INR: INR: 2.4 (ref 2.0–3.0)

## 2024-03-05 NOTE — Patient Instructions (Signed)
 Continue taking warfarin 1 tablet (4mg ) daily EXCEPT 1.5 tablets on Sundays and Tuesdays.  Remain consistent with greens each week (only eats greens occasionally).  Recheck INR in 6 weeks.  Coumadin  Clinic [phone] Amio 200mg  daily d/c 05/08/23

## 2024-04-16 ENCOUNTER — Ambulatory Visit: Payer: MEDICAID | Attending: Cardiology

## 2024-04-16 DIAGNOSIS — Z5181 Encounter for therapeutic drug level monitoring: Secondary | ICD-10-CM | POA: Diagnosis present

## 2024-04-16 DIAGNOSIS — I4891 Unspecified atrial fibrillation: Secondary | ICD-10-CM | POA: Insufficient documentation

## 2024-04-16 DIAGNOSIS — Z952 Presence of prosthetic heart valve: Secondary | ICD-10-CM | POA: Insufficient documentation

## 2024-04-16 LAB — POCT INR: INR: 2 (ref 2.0–3.0)

## 2024-04-16 NOTE — Patient Instructions (Signed)
 Take 2 tablets today then Continue taking warfarin 1 tablet (4mg ) daily EXCEPT 1.5 tablets on Sundays and Tuesdays.  Remain consistent with greens each week (only eats greens occasionally).  Recheck INR in 6 weeks.  Coumadin  Clinic [phone] Amio 200mg  daily d/c 05/08/23

## 2024-05-08 ENCOUNTER — Other Ambulatory Visit: Payer: Self-pay | Admitting: Cardiology

## 2024-05-08 DIAGNOSIS — Z952 Presence of prosthetic heart valve: Secondary | ICD-10-CM

## 2024-05-08 LAB — LAB REPORT - SCANNED
A1c: 5.6
EGFR: 97.6
TSH: 1.76 (ref 0.41–5.90)

## 2024-05-15 ENCOUNTER — Other Ambulatory Visit (HOSPITAL_BASED_OUTPATIENT_CLINIC_OR_DEPARTMENT_OTHER): Payer: Self-pay

## 2024-05-15 ENCOUNTER — Telehealth: Payer: Self-pay | Admitting: Cardiology

## 2024-05-15 NOTE — Telephone Encounter (Signed)
*  STAT* If patient is at the pharmacy, call can be transferred to refill team.   1. Which medications need to be refilled? (please list name of each medication and dose if known)   ezetimibe  (ZETIA ) 10 MG tablet   metoprolol  tartrate (LOPRESSOR ) 25 MG tablet    potassium chloride  (KLOR-CON  M) 10 MEQ tablet    2. Which pharmacy/location (including street and city if local pharmacy) is medication to be sent to? [id number] - [place], [place] - [address]   3. Do they need a 30 day or 90 day supply? 90

## 2024-05-16 MED ORDER — POTASSIUM CHLORIDE CRYS ER 10 MEQ PO TBCR: 10.0000 meq | EXTENDED_RELEASE_TABLET | Freq: Every day | ORAL | 0 refills | Status: AC

## 2024-05-16 MED ORDER — METOPROLOL TARTRATE 25 MG PO TABS: 12.5000 mg | ORAL_TABLET | Freq: Two times a day (BID) | ORAL | 0 refills | Status: AC

## 2024-05-16 MED ORDER — EZETIMIBE 10 MG PO TABS: 10.0000 mg | ORAL_TABLET | Freq: Every day | ORAL | 0 refills | Status: AC

## 2024-05-16 NOTE — Telephone Encounter (Signed)
 Refills have been sent int.

## 2024-05-23 ENCOUNTER — Other Ambulatory Visit: Payer: Self-pay | Admitting: Cardiology

## 2024-05-23 NOTE — Telephone Encounter (Signed)
*  STAT* If patient is at the pharmacy, call can be transferred to refill team.   1. Which medications need to be refilled? (please list name of each medication and dose if known)   pantoprazole  (PROTONIX ) 40 MG tablet   2. Would you like to learn more about the convenience, safety, & potential cost savings by using the [facility]?   3. Are you open to using the [facility] (Type [facility]. ).  4. Which pharmacy/location (including street and city if local pharmacy) is medication to be sent to?  [facility] - [place], [place] - [address]   5. Do they need a 30 day or 90 day supply?   90 day  Patient stated he still has some medication.

## 2024-05-23 NOTE — Telephone Encounter (Signed)
 Pt is requesting Dr. Krasowski to refill medication pantoprazole . Would Dr. Krasowski like to refill this medication he is not prescribe? Please address

## 2024-05-28 ENCOUNTER — Ambulatory Visit: Payer: MEDICAID

## 2024-06-04 ENCOUNTER — Ambulatory Visit: Payer: MEDICAID

## 2024-06-11 ENCOUNTER — Ambulatory Visit: Payer: MEDICAID | Attending: Cardiology

## 2024-06-11 DIAGNOSIS — I4891 Unspecified atrial fibrillation: Secondary | ICD-10-CM | POA: Diagnosis present

## 2024-06-11 DIAGNOSIS — Z952 Presence of prosthetic heart valve: Secondary | ICD-10-CM | POA: Insufficient documentation

## 2024-06-11 DIAGNOSIS — Z5181 Encounter for therapeutic drug level monitoring: Secondary | ICD-10-CM | POA: Insufficient documentation

## 2024-06-11 LAB — POCT INR: INR: 1.9 — AB (ref 2.0–3.0)

## 2024-06-11 NOTE — Patient Instructions (Signed)
 Take 2 tablets today then Continue taking warfarin 1 tablet (4mg ) daily EXCEPT 1.5 tablets on Sundays and Tuesdays.  Remain consistent with greens each week (only eats greens occasionally).  Recheck INR in 4 weeks.  Coumadin  Clinic [phone] Amio 200mg  daily d/c 05/08/23

## 2024-06-12 ENCOUNTER — Telehealth: Payer: Self-pay | Admitting: Cardiology

## 2024-06-12 MED ORDER — TORSEMIDE 20 MG PO TABS: 20.0000 mg | ORAL_TABLET | Freq: Every day | ORAL | 0 refills | Status: AC

## 2024-06-12 MED ORDER — ATORVASTATIN CALCIUM 80 MG PO TABS: 80.0000 mg | ORAL_TABLET | Freq: Every day | ORAL | 0 refills | Status: AC

## 2024-06-12 NOTE — Telephone Encounter (Signed)
 Requested Prescriptions   Signed Prescriptions Disp Refills   atorvastatin  (LIPITOR ) 80 MG tablet 90 tablet 0    Sig: Take 1 tablet (80 mg total) by mouth daily.    Authorizing Provider: BERNIE LAMAR PARAS    Ordering User: WILFRED, Lodie Waheed  C   torsemide  (DEMADEX ) 20 MG tablet 90 tablet 0    Sig: Take 1 tablet (20 mg total) by mouth daily.    Authorizing Provider: BERNIE LAMAR PARAS    Ordering User: WILFRED, Montgomery Favor  C

## 2024-06-12 NOTE — Telephone Encounter (Signed)
*  STAT* If patient is at the pharmacy, call can be transferred to refill team.   1. Which medications need to be refilled? (please list name of each medication and dose if known) atorvastatin  (LIPITOR ) 80 MG tablet, torsemide  (DEMADEX ) 20 MG tablet    2. Would you like to learn more about the convenience, safety, & potential cost savings by using the [name]? NO   3. Are you open to using the [facility] NO   4. Which pharmacy/location (including street and city if local pharmacy) is medication to be sent to?  [facility] - [place], [place] - [address]       5. Do they need a 30 day or 90 day supply? 90

## 2024-07-09 ENCOUNTER — Ambulatory Visit: Payer: MEDICAID | Attending: Cardiology

## 2024-07-09 DIAGNOSIS — I4891 Unspecified atrial fibrillation: Secondary | ICD-10-CM | POA: Diagnosis present

## 2024-07-09 DIAGNOSIS — Z952 Presence of prosthetic heart valve: Secondary | ICD-10-CM | POA: Diagnosis present

## 2024-07-09 DIAGNOSIS — Z5181 Encounter for therapeutic drug level monitoring: Secondary | ICD-10-CM | POA: Insufficient documentation

## 2024-07-09 LAB — POCT INR: INR: 2 (ref 2.0–3.0)

## 2024-07-09 NOTE — Patient Instructions (Signed)
 Take 2 tablets today then Continue taking warfarin 1 tablet (4mg ) daily EXCEPT 1.5 tablets on Sundays and Tuesdays.  Remain consistent with greens each week (only eats greens occasionally).  Recheck INR in 6 weeks.  Coumadin  Clinic [phone] Amio 200mg  daily d/c 05/08/23

## 2024-07-11 ENCOUNTER — Other Ambulatory Visit: Payer: Self-pay | Admitting: Cardiology

## 2024-07-11 DIAGNOSIS — Z952 Presence of prosthetic heart valve: Secondary | ICD-10-CM

## 2024-07-26 ENCOUNTER — Telehealth: Payer: Self-pay | Admitting: *Deleted

## 2024-07-26 NOTE — Telephone Encounter (Signed)
 Fax from Rosina Pollock, NP at [facility] requesting advice on prescribing Tadalafil 20 mg for this pt. He has taken this before and it was effective and well tolerated. Given his cardiac history, Rosina would like your input and clearance regarding the safety of restarting the Tadalafil as needed. Also needs to know if pt has nitroglycerine and considerations for this. Please advise

## 2024-08-02 ENCOUNTER — Ambulatory Visit: Payer: MEDICAID | Attending: Cardiology

## 2024-08-02 DIAGNOSIS — R0609 Other forms of dyspnea: Secondary | ICD-10-CM | POA: Diagnosis present

## 2024-08-02 LAB — ECHOCARDIOGRAM COMPLETE
AV Mean grad: 9 mmHg
AV Peak grad: 15.7 mmHg
Ao pk vel: 1.98 m/s
Area-P 1/2: 2.68 cm2
S' Lateral: 2.9 cm

## 2024-08-02 NOTE — Telephone Encounter (Signed)
 I do not see any critical contraindication from cardiac point of view to take that medication as long as he does not have hypotension and is not taking nitroglycerin . Per Dr. Krasowski

## 2024-08-06 ENCOUNTER — Ambulatory Visit: Payer: Self-pay | Admitting: Cardiology

## 2024-08-12 ENCOUNTER — Ambulatory Visit: Payer: MEDICAID | Admitting: Cardiology

## 2024-08-13 ENCOUNTER — Telehealth: Payer: Self-pay

## 2024-08-13 NOTE — Telephone Encounter (Signed)
 Echo Results reviewed with pt as per Dr. Vanetta Shawl note.  Pt verbalized understanding and had no additional questions. Routed to PCP

## 2024-08-20 ENCOUNTER — Ambulatory Visit: Payer: MEDICAID

## 2024-10-15 ENCOUNTER — Ambulatory Visit: Payer: MEDICAID | Admitting: Cardiology
